# Patient Record
Sex: Female | Born: 1950
Health system: Southern US, Community
[De-identification: ages and names within clinical notes are randomized; demographics above are authoritative.]

## PROBLEM LIST (undated history)

## (undated) DIAGNOSIS — E785 Hyperlipidemia, unspecified: Secondary | ICD-10-CM

## (undated) DIAGNOSIS — R0989 Other specified symptoms and signs involving the circulatory and respiratory systems: Secondary | ICD-10-CM

## (undated) DIAGNOSIS — F32A Depression, unspecified: Secondary | ICD-10-CM

## (undated) DIAGNOSIS — E119 Type 2 diabetes mellitus without complications: Secondary | ICD-10-CM

## (undated) DIAGNOSIS — F419 Anxiety disorder, unspecified: Secondary | ICD-10-CM

## (undated) DIAGNOSIS — R931 Abnormal findings on diagnostic imaging of heart and coronary circulation: Secondary | ICD-10-CM

## (undated) DIAGNOSIS — K219 Gastro-esophageal reflux disease without esophagitis: Secondary | ICD-10-CM

## (undated) DIAGNOSIS — I341 Nonrheumatic mitral (valve) prolapse: Secondary | ICD-10-CM

## (undated) HISTORY — DX: Abnormal findings on diagnostic imaging of heart and coronary circulation: R93.1

## (undated) HISTORY — DX: Other specified symptoms and signs involving the circulatory and respiratory systems: R09.89

## (undated) HISTORY — DX: Type 2 diabetes mellitus without complications: E11.9

## (undated) HISTORY — DX: Hyperlipidemia, unspecified: E78.5

## (undated) HISTORY — DX: Gastro-esophageal reflux disease without esophagitis: K21.9

## (undated) HISTORY — DX: Nonrheumatic mitral (valve) prolapse: I34.1

## (undated) HISTORY — DX: Depression, unspecified: F32.A

## (undated) HISTORY — DX: Anxiety disorder, unspecified: F41.9

## (undated) HISTORY — PX: EYE SURGERY: SHX253

---

## 1997-10-20 ENCOUNTER — Other Ambulatory Visit: Admission: RE | Admit: 1997-10-20 | Discharge: 1997-10-20 | Payer: Self-pay | Admitting: Obstetrics and Gynecology

## 1999-02-01 ENCOUNTER — Other Ambulatory Visit: Admission: RE | Admit: 1999-02-01 | Discharge: 1999-02-01 | Payer: Self-pay | Admitting: *Deleted

## 1999-11-01 ENCOUNTER — Encounter (INDEPENDENT_AMBULATORY_CARE_PROVIDER_SITE_OTHER): Payer: Self-pay | Admitting: Specialist

## 1999-11-01 ENCOUNTER — Other Ambulatory Visit: Admission: RE | Admit: 1999-11-01 | Discharge: 1999-11-01 | Payer: Self-pay | Admitting: Obstetrics and Gynecology

## 1999-12-24 ENCOUNTER — Encounter: Payer: Self-pay | Admitting: Obstetrics and Gynecology

## 1999-12-24 ENCOUNTER — Encounter: Admission: RE | Admit: 1999-12-24 | Discharge: 1999-12-24 | Payer: Self-pay | Admitting: Obstetrics and Gynecology

## 2000-01-01 ENCOUNTER — Encounter: Payer: Self-pay | Admitting: Obstetrics and Gynecology

## 2000-01-01 ENCOUNTER — Encounter: Admission: RE | Admit: 2000-01-01 | Discharge: 2000-01-01 | Payer: Self-pay | Admitting: Obstetrics and Gynecology

## 2000-02-04 ENCOUNTER — Other Ambulatory Visit: Admission: RE | Admit: 2000-02-04 | Discharge: 2000-02-04 | Payer: Self-pay | Admitting: *Deleted

## 2000-07-25 ENCOUNTER — Encounter: Payer: Self-pay | Admitting: Obstetrics and Gynecology

## 2000-07-25 ENCOUNTER — Encounter: Admission: RE | Admit: 2000-07-25 | Discharge: 2000-07-25 | Payer: Self-pay | Admitting: Obstetrics and Gynecology

## 2000-12-30 ENCOUNTER — Encounter: Payer: Self-pay | Admitting: Obstetrics and Gynecology

## 2000-12-30 ENCOUNTER — Encounter: Admission: RE | Admit: 2000-12-30 | Discharge: 2000-12-30 | Payer: Self-pay | Admitting: Obstetrics and Gynecology

## 2001-01-13 ENCOUNTER — Encounter: Payer: Self-pay | Admitting: Obstetrics and Gynecology

## 2001-01-13 ENCOUNTER — Encounter: Admission: RE | Admit: 2001-01-13 | Discharge: 2001-01-13 | Payer: Self-pay | Admitting: Obstetrics and Gynecology

## 2001-02-16 ENCOUNTER — Other Ambulatory Visit: Admission: RE | Admit: 2001-02-16 | Discharge: 2001-02-16 | Payer: Self-pay | Admitting: Obstetrics and Gynecology

## 2002-02-01 ENCOUNTER — Encounter: Admission: RE | Admit: 2002-02-01 | Discharge: 2002-02-01 | Payer: Self-pay | Admitting: Obstetrics and Gynecology

## 2002-02-01 ENCOUNTER — Encounter: Payer: Self-pay | Admitting: Obstetrics and Gynecology

## 2002-02-22 ENCOUNTER — Other Ambulatory Visit: Admission: RE | Admit: 2002-02-22 | Discharge: 2002-02-22 | Payer: Self-pay | Admitting: Obstetrics and Gynecology

## 2003-02-04 ENCOUNTER — Encounter: Admission: RE | Admit: 2003-02-04 | Discharge: 2003-02-04 | Payer: Self-pay | Admitting: Obstetrics and Gynecology

## 2003-02-24 ENCOUNTER — Other Ambulatory Visit: Admission: RE | Admit: 2003-02-24 | Discharge: 2003-02-24 | Payer: Self-pay | Admitting: Obstetrics and Gynecology

## 2004-03-09 ENCOUNTER — Other Ambulatory Visit: Admission: RE | Admit: 2004-03-09 | Discharge: 2004-03-09 | Payer: Self-pay | Admitting: Obstetrics and Gynecology

## 2004-03-15 ENCOUNTER — Encounter: Admission: RE | Admit: 2004-03-15 | Discharge: 2004-03-15 | Payer: Self-pay | Admitting: Obstetrics and Gynecology

## 2004-03-29 ENCOUNTER — Encounter: Admission: RE | Admit: 2004-03-29 | Discharge: 2004-03-29 | Payer: Self-pay | Admitting: Obstetrics and Gynecology

## 2005-03-18 ENCOUNTER — Encounter: Admission: RE | Admit: 2005-03-18 | Discharge: 2005-03-18 | Payer: Self-pay | Admitting: Obstetrics and Gynecology

## 2005-03-29 ENCOUNTER — Other Ambulatory Visit: Admission: RE | Admit: 2005-03-29 | Discharge: 2005-03-29 | Payer: Self-pay | Admitting: Obstetrics and Gynecology

## 2006-03-19 ENCOUNTER — Encounter: Admission: RE | Admit: 2006-03-19 | Discharge: 2006-03-19 | Payer: Self-pay | Admitting: Obstetrics and Gynecology

## 2006-04-15 ENCOUNTER — Other Ambulatory Visit: Admission: RE | Admit: 2006-04-15 | Discharge: 2006-04-15 | Payer: Self-pay | Admitting: Obstetrics & Gynecology

## 2007-03-23 ENCOUNTER — Encounter: Admission: RE | Admit: 2007-03-23 | Discharge: 2007-03-23 | Payer: Self-pay | Admitting: Obstetrics & Gynecology

## 2007-04-17 ENCOUNTER — Other Ambulatory Visit: Admission: RE | Admit: 2007-04-17 | Discharge: 2007-04-17 | Payer: Self-pay | Admitting: Obstetrics and Gynecology

## 2008-02-19 HISTORY — PX: BREAST LUMPECTOMY: SHX2

## 2008-03-23 ENCOUNTER — Encounter: Admission: RE | Admit: 2008-03-23 | Discharge: 2008-03-23 | Payer: Self-pay | Admitting: Obstetrics & Gynecology

## 2008-04-28 ENCOUNTER — Other Ambulatory Visit: Admission: RE | Admit: 2008-04-28 | Discharge: 2008-04-28 | Payer: Self-pay | Admitting: Obstetrics and Gynecology

## 2008-12-15 ENCOUNTER — Encounter: Admission: RE | Admit: 2008-12-15 | Discharge: 2008-12-15 | Payer: Self-pay | Admitting: Obstetrics & Gynecology

## 2008-12-16 ENCOUNTER — Encounter: Admission: RE | Admit: 2008-12-16 | Discharge: 2008-12-16 | Payer: Self-pay | Admitting: Obstetrics and Gynecology

## 2008-12-22 ENCOUNTER — Encounter: Admission: RE | Admit: 2008-12-22 | Discharge: 2008-12-22 | Payer: Self-pay | Admitting: Obstetrics and Gynecology

## 2009-01-02 ENCOUNTER — Encounter (INDEPENDENT_AMBULATORY_CARE_PROVIDER_SITE_OTHER): Payer: Self-pay | Admitting: Surgery

## 2009-01-02 ENCOUNTER — Ambulatory Visit (HOSPITAL_COMMUNITY): Admission: RE | Admit: 2009-01-02 | Discharge: 2009-01-02 | Payer: Self-pay | Admitting: Surgery

## 2010-03-10 ENCOUNTER — Encounter: Payer: Self-pay | Admitting: Oncology

## 2010-03-11 ENCOUNTER — Encounter: Payer: Self-pay | Admitting: Obstetrics and Gynecology

## 2010-05-23 LAB — CBC
HCT: 40.4 % (ref 36.0–46.0)
MCV: 95.8 fL (ref 78.0–100.0)
RBC: 4.22 MIL/uL (ref 3.87–5.11)
WBC: 7 10*3/uL (ref 4.0–10.5)

## 2010-05-23 LAB — BASIC METABOLIC PANEL
CO2: 31 mEq/L (ref 19–32)
Chloride: 101 mEq/L (ref 96–112)
GFR calc Af Amer: >60 mL/min (ref >60)
Glucose, Bld: 102 mg/dL — ABNORMAL HIGH (ref 70–99)
Sodium: 138 mEq/L (ref 135–145)

## 2010-05-23 LAB — DIFFERENTIAL
Eosinophils Absolute: 0.3 10*3/uL (ref 0.0–0.7)
Eosinophils Relative: 5 % (ref 0–5)
Lymphs Abs: 2.5 10*3/uL (ref 0.7–4.0)
Monocytes Absolute: 0.3 10*3/uL (ref 0.1–1.0)
Monocytes Relative: 5 % (ref 3–12)

## 2014-11-21 DIAGNOSIS — Z923 Personal history of irradiation: Secondary | ICD-10-CM

## 2014-11-21 DIAGNOSIS — Z9221 Personal history of antineoplastic chemotherapy: Secondary | ICD-10-CM

## 2014-11-21 DIAGNOSIS — C50919 Malignant neoplasm of unspecified site of unspecified female breast: Secondary | ICD-10-CM

## 2014-11-21 DIAGNOSIS — Z79811 Long term (current) use of aromatase inhibitors: Secondary | ICD-10-CM

## 2015-12-26 DIAGNOSIS — Z79811 Long term (current) use of aromatase inhibitors: Secondary | ICD-10-CM | POA: Diagnosis not present

## 2015-12-26 DIAGNOSIS — Z170 Estrogen receptor positive status [ER+]: Secondary | ICD-10-CM | POA: Diagnosis not present

## 2015-12-26 DIAGNOSIS — M8580 Other specified disorders of bone density and structure, unspecified site: Secondary | ICD-10-CM | POA: Diagnosis not present

## 2015-12-26 DIAGNOSIS — C50212 Malignant neoplasm of upper-inner quadrant of left female breast: Secondary | ICD-10-CM | POA: Diagnosis not present

## 2016-01-12 ENCOUNTER — Encounter (HOSPITAL_COMMUNITY): Payer: Self-pay

## 2016-01-25 DIAGNOSIS — Z853 Personal history of malignant neoplasm of breast: Secondary | ICD-10-CM | POA: Diagnosis not present

## 2016-05-14 DIAGNOSIS — R002 Palpitations: Secondary | ICD-10-CM | POA: Diagnosis not present

## 2016-05-14 DIAGNOSIS — F5102 Adjustment insomnia: Secondary | ICD-10-CM | POA: Diagnosis not present

## 2016-05-21 DIAGNOSIS — R002 Palpitations: Secondary | ICD-10-CM | POA: Diagnosis not present

## 2016-05-22 DIAGNOSIS — D126 Benign neoplasm of colon, unspecified: Secondary | ICD-10-CM | POA: Diagnosis not present

## 2016-05-22 DIAGNOSIS — K219 Gastro-esophageal reflux disease without esophagitis: Secondary | ICD-10-CM | POA: Diagnosis not present

## 2016-05-23 DIAGNOSIS — R002 Palpitations: Secondary | ICD-10-CM | POA: Diagnosis not present

## 2016-06-11 DIAGNOSIS — G4733 Obstructive sleep apnea (adult) (pediatric): Secondary | ICD-10-CM | POA: Diagnosis not present

## 2016-06-19 DIAGNOSIS — Z9012 Acquired absence of left breast and nipple: Secondary | ICD-10-CM | POA: Diagnosis not present

## 2016-06-21 DIAGNOSIS — Z86010 Personal history of colonic polyps: Secondary | ICD-10-CM | POA: Diagnosis not present

## 2016-06-21 DIAGNOSIS — Z85828 Personal history of other malignant neoplasm of skin: Secondary | ICD-10-CM | POA: Diagnosis not present

## 2016-06-21 DIAGNOSIS — Z800 Family history of malignant neoplasm of digestive organs: Secondary | ICD-10-CM | POA: Diagnosis not present

## 2016-06-21 DIAGNOSIS — Z1211 Encounter for screening for malignant neoplasm of colon: Secondary | ICD-10-CM | POA: Diagnosis not present

## 2016-06-21 DIAGNOSIS — Z79899 Other long term (current) drug therapy: Secondary | ICD-10-CM | POA: Diagnosis not present

## 2016-06-21 DIAGNOSIS — E7800 Pure hypercholesterolemia, unspecified: Secondary | ICD-10-CM | POA: Diagnosis not present

## 2016-06-21 DIAGNOSIS — K648 Other hemorrhoids: Secondary | ICD-10-CM | POA: Diagnosis not present

## 2016-06-21 DIAGNOSIS — Z853 Personal history of malignant neoplasm of breast: Secondary | ICD-10-CM | POA: Diagnosis not present

## 2016-06-21 DIAGNOSIS — I10 Essential (primary) hypertension: Secondary | ICD-10-CM | POA: Diagnosis not present

## 2016-07-03 DIAGNOSIS — H04123 Dry eye syndrome of bilateral lacrimal glands: Secondary | ICD-10-CM | POA: Diagnosis not present

## 2016-07-03 DIAGNOSIS — H2510 Age-related nuclear cataract, unspecified eye: Secondary | ICD-10-CM | POA: Diagnosis not present

## 2016-07-03 DIAGNOSIS — H52222 Regular astigmatism, left eye: Secondary | ICD-10-CM | POA: Diagnosis not present

## 2016-10-17 DIAGNOSIS — F5102 Adjustment insomnia: Secondary | ICD-10-CM | POA: Diagnosis not present

## 2016-10-17 DIAGNOSIS — F411 Generalized anxiety disorder: Secondary | ICD-10-CM | POA: Diagnosis not present

## 2016-10-17 DIAGNOSIS — Z23 Encounter for immunization: Secondary | ICD-10-CM | POA: Diagnosis not present

## 2016-11-18 DIAGNOSIS — J302 Other seasonal allergic rhinitis: Secondary | ICD-10-CM | POA: Diagnosis not present

## 2016-11-18 DIAGNOSIS — F5102 Adjustment insomnia: Secondary | ICD-10-CM | POA: Diagnosis not present

## 2016-11-18 DIAGNOSIS — F411 Generalized anxiety disorder: Secondary | ICD-10-CM | POA: Diagnosis not present

## 2017-01-03 DIAGNOSIS — H2510 Age-related nuclear cataract, unspecified eye: Secondary | ICD-10-CM | POA: Diagnosis not present

## 2017-01-03 DIAGNOSIS — H04123 Dry eye syndrome of bilateral lacrimal glands: Secondary | ICD-10-CM | POA: Diagnosis not present

## 2017-02-21 DIAGNOSIS — Z1231 Encounter for screening mammogram for malignant neoplasm of breast: Secondary | ICD-10-CM | POA: Diagnosis not present

## 2017-02-24 DIAGNOSIS — Z853 Personal history of malignant neoplasm of breast: Secondary | ICD-10-CM | POA: Diagnosis not present

## 2017-05-29 DIAGNOSIS — H2510 Age-related nuclear cataract, unspecified eye: Secondary | ICD-10-CM | POA: Diagnosis not present

## 2017-05-29 DIAGNOSIS — H52222 Regular astigmatism, left eye: Secondary | ICD-10-CM | POA: Diagnosis not present

## 2017-06-11 DIAGNOSIS — H2513 Age-related nuclear cataract, bilateral: Secondary | ICD-10-CM | POA: Diagnosis not present

## 2017-06-11 DIAGNOSIS — H18413 Arcus senilis, bilateral: Secondary | ICD-10-CM | POA: Diagnosis not present

## 2017-06-11 DIAGNOSIS — H2511 Age-related nuclear cataract, right eye: Secondary | ICD-10-CM | POA: Diagnosis not present

## 2017-06-11 DIAGNOSIS — I10 Essential (primary) hypertension: Secondary | ICD-10-CM | POA: Diagnosis not present

## 2017-07-01 DIAGNOSIS — H2511 Age-related nuclear cataract, right eye: Secondary | ICD-10-CM | POA: Diagnosis not present

## 2017-07-01 DIAGNOSIS — H25811 Combined forms of age-related cataract, right eye: Secondary | ICD-10-CM | POA: Diagnosis not present

## 2017-07-08 DIAGNOSIS — H25812 Combined forms of age-related cataract, left eye: Secondary | ICD-10-CM | POA: Diagnosis not present

## 2017-07-08 DIAGNOSIS — H2512 Age-related nuclear cataract, left eye: Secondary | ICD-10-CM | POA: Diagnosis not present

## 2017-08-19 DIAGNOSIS — J208 Acute bronchitis due to other specified organisms: Secondary | ICD-10-CM | POA: Diagnosis not present

## 2017-12-02 DIAGNOSIS — Z23 Encounter for immunization: Secondary | ICD-10-CM | POA: Diagnosis not present

## 2018-01-27 DIAGNOSIS — L578 Other skin changes due to chronic exposure to nonionizing radiation: Secondary | ICD-10-CM | POA: Diagnosis not present

## 2018-01-27 DIAGNOSIS — L821 Other seborrheic keratosis: Secondary | ICD-10-CM | POA: Diagnosis not present

## 2018-01-27 DIAGNOSIS — C44619 Basal cell carcinoma of skin of left upper limb, including shoulder: Secondary | ICD-10-CM | POA: Diagnosis not present

## 2018-01-27 DIAGNOSIS — L820 Inflamed seborrheic keratosis: Secondary | ICD-10-CM | POA: Diagnosis not present

## 2018-01-30 DIAGNOSIS — C44619 Basal cell carcinoma of skin of left upper limb, including shoulder: Secondary | ICD-10-CM | POA: Diagnosis not present

## 2018-02-16 DIAGNOSIS — E782 Mixed hyperlipidemia: Secondary | ICD-10-CM | POA: Diagnosis not present

## 2018-02-16 DIAGNOSIS — I10 Essential (primary) hypertension: Secondary | ICD-10-CM | POA: Diagnosis not present

## 2018-02-25 DIAGNOSIS — Z6822 Body mass index (BMI) 22.0-22.9, adult: Secondary | ICD-10-CM | POA: Diagnosis not present

## 2018-02-25 DIAGNOSIS — E782 Mixed hyperlipidemia: Secondary | ICD-10-CM | POA: Diagnosis not present

## 2018-02-25 DIAGNOSIS — Z0000 Encounter for general adult medical examination without abnormal findings: Secondary | ICD-10-CM | POA: Diagnosis not present

## 2018-02-25 DIAGNOSIS — F411 Generalized anxiety disorder: Secondary | ICD-10-CM | POA: Diagnosis not present

## 2018-02-25 DIAGNOSIS — I10 Essential (primary) hypertension: Secondary | ICD-10-CM | POA: Diagnosis not present

## 2018-02-26 DIAGNOSIS — Z1231 Encounter for screening mammogram for malignant neoplasm of breast: Secondary | ICD-10-CM | POA: Diagnosis not present

## 2018-03-03 DIAGNOSIS — Z853 Personal history of malignant neoplasm of breast: Secondary | ICD-10-CM | POA: Diagnosis not present

## 2018-03-18 DIAGNOSIS — M85852 Other specified disorders of bone density and structure, left thigh: Secondary | ICD-10-CM | POA: Diagnosis not present

## 2018-03-18 DIAGNOSIS — N958 Other specified menopausal and perimenopausal disorders: Secondary | ICD-10-CM | POA: Diagnosis not present

## 2018-04-10 DIAGNOSIS — F419 Anxiety disorder, unspecified: Secondary | ICD-10-CM

## 2018-04-10 DIAGNOSIS — E785 Hyperlipidemia, unspecified: Secondary | ICD-10-CM

## 2018-04-10 DIAGNOSIS — R0600 Dyspnea, unspecified: Secondary | ICD-10-CM | POA: Diagnosis not present

## 2018-04-10 DIAGNOSIS — Z853 Personal history of malignant neoplasm of breast: Secondary | ICD-10-CM | POA: Diagnosis not present

## 2018-04-10 DIAGNOSIS — K219 Gastro-esophageal reflux disease without esophagitis: Secondary | ICD-10-CM | POA: Diagnosis not present

## 2018-04-10 DIAGNOSIS — Z79899 Other long term (current) drug therapy: Secondary | ICD-10-CM | POA: Diagnosis not present

## 2018-04-10 DIAGNOSIS — E871 Hypo-osmolality and hyponatremia: Secondary | ICD-10-CM | POA: Diagnosis not present

## 2018-04-10 DIAGNOSIS — R072 Precordial pain: Secondary | ICD-10-CM | POA: Diagnosis not present

## 2018-04-10 DIAGNOSIS — R202 Paresthesia of skin: Secondary | ICD-10-CM | POA: Diagnosis not present

## 2018-04-10 DIAGNOSIS — R413 Other amnesia: Secondary | ICD-10-CM

## 2018-04-10 DIAGNOSIS — I10 Essential (primary) hypertension: Secondary | ICD-10-CM

## 2018-04-10 DIAGNOSIS — R51 Headache: Secondary | ICD-10-CM | POA: Diagnosis not present

## 2018-04-10 DIAGNOSIS — R079 Chest pain, unspecified: Secondary | ICD-10-CM | POA: Diagnosis not present

## 2018-04-11 DIAGNOSIS — F419 Anxiety disorder, unspecified: Secondary | ICD-10-CM | POA: Diagnosis not present

## 2018-04-11 DIAGNOSIS — E785 Hyperlipidemia, unspecified: Secondary | ICD-10-CM | POA: Diagnosis not present

## 2018-04-11 DIAGNOSIS — R413 Other amnesia: Secondary | ICD-10-CM | POA: Diagnosis not present

## 2018-04-11 DIAGNOSIS — R51 Headache: Secondary | ICD-10-CM | POA: Diagnosis not present

## 2018-04-11 DIAGNOSIS — R410 Disorientation, unspecified: Secondary | ICD-10-CM | POA: Diagnosis not present

## 2018-04-11 DIAGNOSIS — I10 Essential (primary) hypertension: Secondary | ICD-10-CM | POA: Diagnosis not present

## 2018-04-11 DIAGNOSIS — E871 Hypo-osmolality and hyponatremia: Secondary | ICD-10-CM | POA: Diagnosis not present

## 2018-04-14 DIAGNOSIS — I10 Essential (primary) hypertension: Secondary | ICD-10-CM | POA: Diagnosis not present

## 2018-04-14 DIAGNOSIS — G43109 Migraine with aura, not intractable, without status migrainosus: Secondary | ICD-10-CM | POA: Diagnosis not present

## 2018-04-14 DIAGNOSIS — E871 Hypo-osmolality and hyponatremia: Secondary | ICD-10-CM | POA: Diagnosis not present

## 2018-04-15 ENCOUNTER — Other Ambulatory Visit: Payer: Self-pay | Admitting: *Deleted

## 2018-04-15 NOTE — Patient Outreach (Signed)
[facility]) Care Management  04/15/2018  Audrey Ruiz Feb 08, 1951 [id number]   Transition of care Referral date: 04/14/2018 Referral source Insurance : [employer]   Transition of care will be completed by primary care provider office who will refer to [name] care management if needed.   PLAN:  RNCM will close patient due to patient being enrolled in an external program.   Audrey Ruiz H. Annia Friendly, BSN, [place] [facility] Telephonic CM Phone: [phone] Fax: [phone]

## 2018-07-06 DIAGNOSIS — E782 Mixed hyperlipidemia: Secondary | ICD-10-CM | POA: Diagnosis not present

## 2018-07-06 DIAGNOSIS — F411 Generalized anxiety disorder: Secondary | ICD-10-CM | POA: Diagnosis not present

## 2018-07-06 DIAGNOSIS — I10 Essential (primary) hypertension: Secondary | ICD-10-CM | POA: Diagnosis not present

## 2018-10-07 DIAGNOSIS — E782 Mixed hyperlipidemia: Secondary | ICD-10-CM | POA: Diagnosis not present

## 2018-10-07 DIAGNOSIS — I10 Essential (primary) hypertension: Secondary | ICD-10-CM | POA: Diagnosis not present

## 2018-10-07 DIAGNOSIS — F411 Generalized anxiety disorder: Secondary | ICD-10-CM | POA: Diagnosis not present

## 2018-10-07 DIAGNOSIS — Z23 Encounter for immunization: Secondary | ICD-10-CM | POA: Diagnosis not present

## 2019-02-19 HISTORY — PX: CATARACT EXTRACTION BILATERAL W/ ANTERIOR VITRECTOMY: SHX1304

## 2019-03-02 DIAGNOSIS — E782 Mixed hyperlipidemia: Secondary | ICD-10-CM | POA: Diagnosis not present

## 2019-03-02 DIAGNOSIS — I10 Essential (primary) hypertension: Secondary | ICD-10-CM | POA: Diagnosis not present

## 2019-03-04 DIAGNOSIS — I10 Essential (primary) hypertension: Secondary | ICD-10-CM | POA: Diagnosis not present

## 2019-03-04 DIAGNOSIS — F411 Generalized anxiety disorder: Secondary | ICD-10-CM | POA: Diagnosis not present

## 2019-03-04 DIAGNOSIS — E782 Mixed hyperlipidemia: Secondary | ICD-10-CM | POA: Diagnosis not present

## 2019-04-15 ENCOUNTER — Other Ambulatory Visit: Payer: Self-pay | Admitting: Family Medicine

## 2019-04-22 ENCOUNTER — Other Ambulatory Visit: Payer: Self-pay | Admitting: Family Medicine

## 2019-06-21 ENCOUNTER — Other Ambulatory Visit: Payer: Self-pay | Admitting: Family Medicine

## 2019-06-21 DIAGNOSIS — E782 Mixed hyperlipidemia: Secondary | ICD-10-CM

## 2019-06-22 ENCOUNTER — Other Ambulatory Visit: Payer: PPO

## 2019-06-22 ENCOUNTER — Other Ambulatory Visit: Payer: Self-pay

## 2019-06-22 DIAGNOSIS — E782 Mixed hyperlipidemia: Secondary | ICD-10-CM | POA: Diagnosis not present

## 2019-06-23 LAB — LIPID PANEL
Chol/HDL Ratio: 2.6 ratio (ref 0.0–4.4)
Cholesterol, Total: 197 mg/dL (ref 100–199)
HDL: 75 mg/dL (ref >39)
LDL Chol Calc (NIH): 105 mg/dL — ABNORMAL HIGH (ref 0–99)
Triglycerides: 96 mg/dL (ref 0–149)
VLDL Cholesterol Cal: 17 mg/dL (ref 5–40)

## 2019-06-23 LAB — CARDIOVASCULAR RISK ASSESSMENT

## 2019-06-29 DIAGNOSIS — Z1231 Encounter for screening mammogram for malignant neoplasm of breast: Secondary | ICD-10-CM | POA: Diagnosis not present

## 2019-07-05 DIAGNOSIS — I10 Essential (primary) hypertension: Secondary | ICD-10-CM | POA: Diagnosis not present

## 2019-07-05 DIAGNOSIS — Z961 Presence of intraocular lens: Secondary | ICD-10-CM | POA: Diagnosis not present

## 2019-07-05 DIAGNOSIS — H18413 Arcus senilis, bilateral: Secondary | ICD-10-CM | POA: Diagnosis not present

## 2019-07-05 DIAGNOSIS — H26492 Other secondary cataract, left eye: Secondary | ICD-10-CM | POA: Diagnosis not present

## 2019-07-12 ENCOUNTER — Other Ambulatory Visit: Payer: Self-pay | Admitting: Physician Assistant

## 2019-07-12 DIAGNOSIS — H26492 Other secondary cataract, left eye: Secondary | ICD-10-CM | POA: Diagnosis not present

## 2019-07-12 DIAGNOSIS — H26491 Other secondary cataract, right eye: Secondary | ICD-10-CM | POA: Diagnosis not present

## 2019-07-12 NOTE — Telephone Encounter (Signed)
?  ok to refill °

## 2019-07-23 DIAGNOSIS — C50212 Malignant neoplasm of upper-inner quadrant of left female breast: Secondary | ICD-10-CM | POA: Diagnosis not present

## 2019-07-23 DIAGNOSIS — Z853 Personal history of malignant neoplasm of breast: Secondary | ICD-10-CM | POA: Diagnosis not present

## 2019-07-23 DIAGNOSIS — Z170 Estrogen receptor positive status [ER+]: Secondary | ICD-10-CM | POA: Diagnosis not present

## 2019-07-26 ENCOUNTER — Other Ambulatory Visit: Payer: Self-pay | Admitting: Family Medicine

## 2019-08-16 ENCOUNTER — Encounter: Payer: Self-pay | Admitting: Family Medicine

## 2019-08-16 ENCOUNTER — Telehealth (INDEPENDENT_AMBULATORY_CARE_PROVIDER_SITE_OTHER): Payer: PPO | Admitting: Family Medicine

## 2019-08-16 VITALS — BP 119/66 | HR 81 | Ht 66.0 in | Wt 134.5 lb

## 2019-08-16 DIAGNOSIS — J329 Chronic sinusitis, unspecified: Secondary | ICD-10-CM

## 2019-08-16 HISTORY — DX: Chronic sinusitis, unspecified: J32.9

## 2019-08-16 MED ORDER — AMOXICILLIN 500 MG PO CAPS: 500.0000 mg | ORAL_CAPSULE | Freq: Two times a day (BID) | ORAL | 0 refills | Status: DC

## 2019-08-16 NOTE — Progress Notes (Addendum)
Virtual Visit via Telephone Note   This visit type was conducted due to national recommendations for restrictions regarding the COVID-19 Pandemic (e.g. social distancing) in an effort to limit this patient's exposure and mitigate transmission in our community.  Due to her co-morbid illnesses, this patient is at least at moderate risk for complications without adequate follow up.  This format is felt to be most appropriate for this patient at this time.  The patient did not have access to video technology/had technical difficulties with video requiring transitioning to audio format only (telephone).  All issues noted in this document were discussed and addressed.  No physical exam could be performed with this format.  Patient verbally consented to a telehealth visit.   Patient Location:home Provider Location:office  PCP:  Benny Lennert, MD   Evaluation Performed: Acute visit   Subjective:    Patient ID: Audrey Ruiz, female    DOB: [birth year], 69 y.o.   MRN: [id number]  Chief Complaint  Patient presents with  . Sinusitis    HPI Pt seen today by  telephone  - I connected with  [name] on 08/16/19 by telephone and verified that I am speaking with the correct person.Physician  in the office. Patient at home   I discussed the limitations of evaluation and management by telemedicine. The patient expressed understanding and agreed to proceed. Pt with potential COVID symptoms Pt states congestion, cough, fever, ear popping, loss of taste and smell. Pt states facial pressure-forehead and fatigue. Daughter came to visit with similar symptoms last week. Pt states she had onset of symptoms several days after she left. Daughter is feeling better after taking antibiotics.  Pt completed COVID vaccines in March  Social History   Socioeconomic History  . Marital status: Married    Spouse name: Not on file  . Number of children: Not on file  . Years of education: Not  on file  . Highest education level: Not on file  Occupational History  . Not on file  Tobacco Use  . Smoking status: Not on file  Substance and Sexual Activity  . Alcohol use: Not on file  . Drug use: Not on file  . Sexual activity: Not on file  Other Topics Concern  . Not on file  Social History Narrative  . Not on file   Social Determinants of Health   Financial Resource Strain:   . Difficulty of Paying Living Expenses:   Food Insecurity:   . Worried About Charity fundraiser in the Last Year:   . Arboriculturist in the Last Year:   Transportation Needs:   . Film/video editor (Medical):   Marland Kitchen Lack of Transportation (Non-Medical):   Physical Activity:   . Days of Exercise per Week:   . Minutes of Exercise per Session:   Stress:   . Feeling of Stress :   Social Connections:   . Frequency of Communication with Friends and Family:   . Frequency of Social Gatherings with Friends and Family:   . Attends Religious Services:   . Active Member of Clubs or Organizations:   . Attends Archivist Meetings:   Marland Kitchen Marital Status:   Intimate Partner Violence:   . Fear of Current or Ex-Partner:   . Emotionally Abused:   Marland Kitchen Physically Abused:   . Sexually Abused:     Outpatient Medications Prior to Visit  Medication Sig Dispense Refill  . FLUoxetine (PROZAC) 20 MG  capsule Take 1 capsule by mouth once daily 90 capsule 0  . rosuvastatin (CRESTOR) 40 MG tablet Take 1 tablet by mouth once daily 90 tablet 3   No facility-administered medications prior to visit.    No Known Allergies  Review of Systems  Constitutional: Positive for fatigue and fever. Negative for chills.  HENT: Positive for congestion, rhinorrhea and sinus pressure. Negative for ear pain.   Respiratory: Positive for cough. Negative for shortness of breath.   Cardiovascular: Negative for chest pain and palpitations.  Gastrointestinal: Positive for abdominal pain. Negative for nausea and vomiting.    Neurological: Positive for headaches. Negative for dizziness.       Objective:    Physical Exam pt took temp-afebrile at home-telephone visit-hoarseness    Wt Readings from Last 3 Encounters:  08/16/19 134 lb 8 oz (61 kg)    Health Maintenance Due  Topic Date Due  . Hepatitis C Screening  Never done  . TETANUS/TDAP  Never done  . COLONOSCOPY  Never done  . MAMMOGRAM  12/17/2010  . PNA vac Low Risk Adult (1 of 2 - PCV13) Never done     Lab Results  Component Value Date   WBC 7.0 12/29/2008   HGB 14.0 12/29/2008   HCT 40.4 12/29/2008   MCV 95.8 12/29/2008   PLT 290 12/29/2008   Lab Results  Component Value Date   NA 138 12/29/2008   K 3.1 (L) 12/29/2008   CO2 31 12/29/2008   GLUCOSE 102 (H) 12/29/2008   BUN 15 12/29/2008   CREATININE 0.86 12/29/2008   CALCIUM 10.0 12/29/2008   Lab Results  Component Value Date   CHOL 197 06/22/2019   Lab Results  Component Value Date   HDL 75 06/22/2019   Lab Results  Component Value Date   LDLCALC 105 (H) 06/22/2019   Lab Results  Component Value Date   TRIG 96 06/22/2019   Lab Results  Component Value Date   CHOLHDL 2.6 06/22/2019      Assessment & Plan:  1. Sinusitis, unspecified chronicity, unspecified location Amoxil-rx, mucinex, flonase-appt in clinic for testing and additional evaluation if no improvement.   Spent 7 minutes with the patient with telehealth technology discussing the above problems.   Follow Up:  Virtual Visit  prn  Benny Lennert, MD [place] [phone]

## 2019-08-16 NOTE — Patient Instructions (Signed)
Pick up medication for sinusitis Amoxil-prescription Mucinex-over the Technical brewer the counter

## 2019-09-01 NOTE — Progress Notes (Signed)
Request completed.

## 2019-09-27 ENCOUNTER — Other Ambulatory Visit: Payer: Self-pay | Admitting: Family Medicine

## 2019-10-19 ENCOUNTER — Ambulatory Visit (INDEPENDENT_AMBULATORY_CARE_PROVIDER_SITE_OTHER): Payer: PPO | Admitting: Family Medicine

## 2019-10-19 ENCOUNTER — Encounter: Payer: Self-pay | Admitting: Family Medicine

## 2019-10-19 ENCOUNTER — Other Ambulatory Visit: Payer: Self-pay

## 2019-10-19 VITALS — BP 110/70 | HR 70 | Temp 97.4°F | Resp 17 | Ht 67.0 in | Wt 132.0 lb

## 2019-10-19 DIAGNOSIS — Z0000 Encounter for general adult medical examination without abnormal findings: Secondary | ICD-10-CM

## 2019-10-19 DIAGNOSIS — R7303 Prediabetes: Secondary | ICD-10-CM | POA: Insufficient documentation

## 2019-10-19 DIAGNOSIS — E785 Hyperlipidemia, unspecified: Secondary | ICD-10-CM

## 2019-10-19 DIAGNOSIS — M8580 Other specified disorders of bone density and structure, unspecified site: Secondary | ICD-10-CM

## 2019-10-19 DIAGNOSIS — K219 Gastro-esophageal reflux disease without esophagitis: Secondary | ICD-10-CM | POA: Insufficient documentation

## 2019-10-19 DIAGNOSIS — E1169 Type 2 diabetes mellitus with other specified complication: Secondary | ICD-10-CM | POA: Insufficient documentation

## 2019-10-19 HISTORY — DX: Gastro-esophageal reflux disease without esophagitis: K21.9

## 2019-10-19 HISTORY — DX: Encounter for general adult medical examination without abnormal findings: Z00.00

## 2019-10-19 HISTORY — DX: Type 2 diabetes mellitus with other specified complication: E11.69

## 2019-10-19 HISTORY — DX: Other specified disorders of bone density and structure, unspecified site: M85.80

## 2019-10-19 NOTE — Progress Notes (Signed)
Subjective:    Audrey Ruiz is a 69 y.o. female who presents for Medicare Annual/Subsequent preventive examination.  Preventive Screening-Counseling & Management  Tobacco Social History   Tobacco Use  Smoking Status Never Smoker  Smokeless Tobacco Never Used    Problems Prior to Visit 1. Osteopenia-Ca -told to decrease due to elevated calcium+Vit D, walking 2. HTN 3. Hyperlipidemia 4. GAD  Current Problems (verified) Patient Active Problem List   Diagnosis Date Noted  . Sinusitis 08/16/2019    Medications Prior to Visit Current Outpatient Medications on File Prior to Visit  Medication Sig Dispense Refill  . FLUoxetine (PROZAC) 20 MG capsule Take 1 capsule by mouth once daily 90 capsule 0  . fluticasone (FLONASE) 50 MCG/ACT nasal spray Use 2 spray(s) in each nostril once daily 48 g 3  . omeprazole (PRILOSEC) 20 MG capsule Take 20 mg by mouth daily.    . rosuvastatin (CRESTOR) 40 MG tablet Take 1 tablet by mouth once daily 90 tablet 3   No current facility-administered medications on file prior to visit.    Current Medications (verified) Current Outpatient Medications  Medication Sig Dispense Refill  . FLUoxetine (PROZAC) 20 MG capsule Take 1 capsule by mouth once daily 90 capsule 0  . fluticasone (FLONASE) 50 MCG/ACT nasal spray Use 2 spray(s) in each nostril once daily 48 g 3  . omeprazole (PRILOSEC) 20 MG capsule Take 20 mg by mouth daily.    . rosuvastatin (CRESTOR) 40 MG tablet Take 1 tablet by mouth once daily 90 tablet 3   No current facility-administered medications for this visit.     Allergies (verified) Patient has no known allergies.   PAST HISTORY  Family History Family History  Problem Relation Age of Onset  . Osteoporosis Mother   . Coronary artery disease Father   . Diabetes Sister   . Diabetes Maternal Grandfather     Social History-helps care for elderly parents, aunt Social History   Tobacco Use  . Smoking status: Never  Smoker  . Smokeless tobacco: Never Used  Substance Use Topics  . Alcohol use: Yes    Alcohol/week: 2.0 standard drinks    Types: 2 Glasses of wine per week    Comment: Everyday     Are there smokers in your home (other than you)? No Dad when pt was a teenager  Risk Factors Current exercise habits:  walking Dietary issues discussed: garden-veggies  Cardiac risk factors: well controlled risk  Depression Screen PHQ9-1  Activities of Daily Living In your present state of health, do you have any difficulty performing the following activities?:  Driving? no Managing money? no Feeding yourself? no Getting from bed to chair? no Climbing a flight of stairs?  no Preparing food and eating?: no Bathing or showering?  No dressing: no Getting to the toilet? No Using the toilet no Moving around from place to place: no In the past year have you fallen or had a near fall?:no falls   Are you sexually active?  yes  Do you have more than one partner?   no  Hearing Difficulties:  Do you often ask people to speak up or repeat themselves? Yes Do you experience ringing or noises in your ears?  yes Do you have difficulty understanding soft or whispered voices? no  Do you feel that you have a problem with memory? no  Do you often misplace items? no  Do you feel safe at home?  Yes  Cognitive Testing  None-concern for husband  Advanced Directives have been discussed with the patient? Yes-needs living will  List the Names of Other Physician/Practitioners you currently use: 1.  Dr. Lewis-oncology 2.  Dr. Matt Holmes special/ophth-cataract replacement  Immunization History  Administered Date(s) Administered  . Moderna SARS-COVID-2 Vaccination 04/16/2019, 05/17/2019    Screening Tests Needs shingles vaccine Colonoscopy  06-21-16 Mammogram 02-20-17, 20, 21-ordered by Dr. Bobby Rumpf at [facility] oncology center-followed  DEXA 03-18-18 Mertha Baars, MD   10/19/2019    Objective:    Body mass  index is 20.67 kg/m. BP 110/70   Pulse 70   Temp (!) 97.4 F (36.3 C)   Resp 17   Ht 5\' 7"  (1.702 m)   Wt 132 lb (59.9 kg)   BMI 20.67 kg/m    Assessment:     1. Encounter for Medicare annual wellness exam  pt needs COVID -3rd dose-2/21(1st) and 3/21 (2nd) Living will-needs paperwork 2. Hyperlipidemia associated with type 2 diabetes mellitus (HCC) - Lipid panel - Comprehensive Metabolic Panel (CMET) Crestor daily 3. Gastroesophageal reflux disease, unspecified whether esophagitis present - CBC w/Diff/Platelet Omeprazole daily 4. Osteopenia, unspecified location - VITAMIN D 25 Hydroxy (Vit-D Deficiency, Fractures)  elevated Ca-10.6-told to NOT take extra due to elevation Plan:    During the course of the visit the patient was educated and counseled about appropriate screening and preventive services including:   Medicare Attestation I have personally reviewed: The patient's medical and social history Their use of alcohol, tobacco or illicit drugs Their current medications and supplements The patient's functional ability including ADLs,fall risks, home safety risks, cognitive, and hearing and visual impairment Diet and physical activities Evidence for depression or mood disorders  The patient's weight, height, BMI, and visual acuity have been recorded in the chart.  I have made referrals, counseling, and provided education to the patient based on review of the above and I have provided the patient with a written personalized care plan for preventive services.     Mertha Baars, MD   10/19/2019

## 2019-10-19 NOTE — Patient Instructions (Addendum)
Shingles vaccine recommended Continue mammogram yearly Bone density 02/2020-continue vitd  Copy of Living Will

## 2019-10-20 ENCOUNTER — Other Ambulatory Visit (INDEPENDENT_AMBULATORY_CARE_PROVIDER_SITE_OTHER): Payer: PPO

## 2019-10-20 ENCOUNTER — Encounter: Payer: Self-pay | Admitting: Family Medicine

## 2019-10-20 ENCOUNTER — Other Ambulatory Visit: Payer: Self-pay

## 2019-10-20 ENCOUNTER — Other Ambulatory Visit: Payer: Self-pay | Admitting: Family Medicine

## 2019-10-20 DIAGNOSIS — Z23 Encounter for immunization: Secondary | ICD-10-CM

## 2019-10-20 LAB — COMPREHENSIVE METABOLIC PANEL
ALT: 15 IU/L (ref 0–32)
AST: 26 IU/L (ref 0–40)
Albumin/Globulin Ratio: 2.2 (ref 1.2–2.2)
Albumin: 4.6 g/dL (ref 3.8–4.8)
Alkaline Phosphatase: 61 IU/L (ref 48–121)
BUN/Creatinine Ratio: 18 (ref 12–28)
BUN: 12 mg/dL (ref 8–27)
Bilirubin Total: 0.9 mg/dL (ref 0.0–1.2)
CO2: 22 mmol/L (ref 20–29)
Calcium: 10.7 mg/dL — ABNORMAL HIGH (ref 8.7–10.3)
Chloride: 105 mmol/L (ref 96–106)
Creatinine, Ser: 0.66 mg/dL (ref 0.57–1.00)
GFR calc Af Amer: 104 mL/min/{1.73_m2} (ref >59)
GFR calc non Af Amer: 90 mL/min/{1.73_m2} (ref >59)
Globulin, Total: 2.1 g/dL (ref 1.5–4.5)
Glucose: 87 mg/dL (ref 65–99)
Potassium: 4.7 mmol/L (ref 3.5–5.2)
Sodium: 141 mmol/L (ref 134–144)
Total Protein: 6.7 g/dL (ref 6.0–8.5)

## 2019-10-20 LAB — CBC WITH DIFFERENTIAL/PLATELET
Basophils Absolute: 0.1 10*3/uL (ref 0.0–0.2)
Basos: 1 %
EOS (ABSOLUTE): 0.1 10*3/uL (ref 0.0–0.4)
Eos: 3 %
Hematocrit: 40.8 % (ref 34.0–46.6)
Hemoglobin: 13.2 g/dL (ref 11.1–15.9)
Immature Grans (Abs): 0 10*3/uL (ref 0.0–0.1)
Immature Granulocytes: 0 %
Lymphocytes Absolute: 2 10*3/uL (ref 0.7–3.1)
Lymphs: 42 %
MCH: 31.4 pg (ref 26.6–33.0)
MCHC: 32.4 g/dL (ref 31.5–35.7)
MCV: 97 fL (ref 79–97)
Monocytes Absolute: 0.3 10*3/uL (ref 0.1–0.9)
Monocytes: 7 %
Neutrophils Absolute: 2.2 10*3/uL (ref 1.4–7.0)
Neutrophils: 47 %
Platelets: 289 10*3/uL (ref 150–450)
RBC: 4.2 x10E6/uL (ref 3.77–5.28)
RDW: 13.2 % (ref 11.7–15.4)
WBC: 4.7 10*3/uL (ref 3.4–10.8)

## 2019-10-20 LAB — LIPID PANEL
Chol/HDL Ratio: 2.6 ratio (ref 0.0–4.4)
Cholesterol, Total: 199 mg/dL (ref 100–199)
HDL: 76 mg/dL (ref >39)
LDL Chol Calc (NIH): 107 mg/dL — ABNORMAL HIGH (ref 0–99)
Triglycerides: 89 mg/dL (ref 0–149)
VLDL Cholesterol Cal: 16 mg/dL (ref 5–40)

## 2019-10-20 LAB — VITAMIN D 25 HYDROXY (VIT D DEFICIENCY, FRACTURES): Vit D, 25-Hydroxy: 71.1 ng/mL (ref 30.0–100.0)

## 2019-10-20 LAB — CARDIOVASCULAR RISK ASSESSMENT

## 2019-10-20 NOTE — Progress Notes (Signed)
ele

## 2019-10-21 ENCOUNTER — Encounter: Payer: Self-pay | Admitting: Family Medicine

## 2019-10-21 LAB — PTH, INTACT AND CALCIUM
Calcium: 10.6 mg/dL — ABNORMAL HIGH (ref 8.7–10.3)
PTH: 41 pg/mL (ref 15–65)

## 2019-10-22 ENCOUNTER — Other Ambulatory Visit: Payer: Self-pay | Admitting: Physician Assistant

## 2019-10-25 NOTE — Addendum Note (Signed)
Addended byRochel Brome on: 10/25/2019 02:09 PM   Modules accepted: Level of Service

## 2019-11-02 DIAGNOSIS — L570 Actinic keratosis: Secondary | ICD-10-CM | POA: Diagnosis not present

## 2019-11-02 DIAGNOSIS — L820 Inflamed seborrheic keratosis: Secondary | ICD-10-CM | POA: Diagnosis not present

## 2019-11-02 DIAGNOSIS — L578 Other skin changes due to chronic exposure to nonionizing radiation: Secondary | ICD-10-CM | POA: Diagnosis not present

## 2019-11-02 DIAGNOSIS — C44619 Basal cell carcinoma of skin of left upper limb, including shoulder: Secondary | ICD-10-CM | POA: Diagnosis not present

## 2019-11-02 DIAGNOSIS — L821 Other seborrheic keratosis: Secondary | ICD-10-CM | POA: Diagnosis not present

## 2019-11-16 ENCOUNTER — Other Ambulatory Visit: Payer: Self-pay | Admitting: Family Medicine

## 2019-11-16 DIAGNOSIS — K219 Gastro-esophageal reflux disease without esophagitis: Secondary | ICD-10-CM

## 2020-01-14 ENCOUNTER — Other Ambulatory Visit: Payer: Self-pay | Admitting: Family Medicine

## 2020-02-21 ENCOUNTER — Telehealth: Payer: Self-pay | Admitting: Family Medicine

## 2020-02-21 NOTE — Progress Notes (Signed)
  Chronic Care Management   Outreach Note  02/21/2020 Name: Audrey Ruiz MRN: [id number] DOB: 1950-09-09  Referred by: Blane Ohara, MD Reason for referral : Chronic Care Management   An unsuccessful telephone outreach was attempted today. The patient was referred to the pharmacist for assistance with care management and care coordination.   Follow Up Plan:   [name]  Upstream Scheduler

## 2020-05-23 ENCOUNTER — Other Ambulatory Visit: Payer: Self-pay

## 2020-05-29 ENCOUNTER — Other Ambulatory Visit: Payer: Self-pay

## 2020-05-29 MED ORDER — ROSUVASTATIN CALCIUM 40 MG PO TABS: 40.0000 mg | ORAL_TABLET | Freq: Every day | ORAL | 2 refills | Status: DC

## 2020-06-01 ENCOUNTER — Ambulatory Visit (INDEPENDENT_AMBULATORY_CARE_PROVIDER_SITE_OTHER): Payer: PPO | Admitting: Nurse Practitioner

## 2020-06-01 ENCOUNTER — Other Ambulatory Visit: Payer: Self-pay | Admitting: Nurse Practitioner

## 2020-06-01 ENCOUNTER — Encounter: Payer: Self-pay | Admitting: Nurse Practitioner

## 2020-06-01 ENCOUNTER — Other Ambulatory Visit: Payer: Self-pay

## 2020-06-01 VITALS — BP 128/64 | HR 72 | Temp 97.3°F | Resp 16 | Ht 67.0 in | Wt 138.0 lb

## 2020-06-01 DIAGNOSIS — N182 Chronic kidney disease, stage 2 (mild): Secondary | ICD-10-CM

## 2020-06-01 DIAGNOSIS — E1122 Type 2 diabetes mellitus with diabetic chronic kidney disease: Secondary | ICD-10-CM

## 2020-06-01 DIAGNOSIS — F320 Major depressive disorder, single episode, mild: Secondary | ICD-10-CM | POA: Diagnosis not present

## 2020-06-01 DIAGNOSIS — K219 Gastro-esophageal reflux disease without esophagitis: Secondary | ICD-10-CM | POA: Diagnosis not present

## 2020-06-01 DIAGNOSIS — I341 Nonrheumatic mitral (valve) prolapse: Secondary | ICD-10-CM

## 2020-06-01 MED ORDER — FLUOXETINE HCL 10 MG PO TABS: 10.0000 mg | ORAL_TABLET | Freq: Every day | ORAL | 3 refills | Status: DC

## 2020-06-01 NOTE — Patient Instructions (Addendum)
Increase Fluoxetine to 30 mg daily Notify office of side effects We will call you with lab results and cardiology referral appointment Continue all medications Follow up fasting appointment 3 months  Preventive Care 72 Years and Older, Female Preventive care refers to lifestyle choices and visits with your health care provider that can promote health and wellness. This includes:  A yearly physical exam. This is also called an annual wellness visit.  Regular dental and eye exams.  Immunizations.  Screening for certain conditions.  Healthy lifestyle choices, such as: ? Eating a healthy diet. ? Getting regular exercise. ? Not using drugs or products that contain nicotine and tobacco. ? Limiting alcohol use. What can I expect for my preventive care visit? Physical exam Your health care provider will check your:  Height and weight. These may be used to calculate your BMI (body mass index). BMI is a measurement that tells if you are at a healthy weight.  Heart rate and blood pressure.  Body temperature.  Skin for abnormal spots. Counseling Your health care provider may ask you questions about your:  Past medical problems.  Family's medical history.  Alcohol, tobacco, and drug use.  Emotional well-being.  Home life and relationship well-being.  Sexual activity.  Diet, exercise, and sleep habits.  History of falls.  Memory and ability to understand (cognition).  Work and work Statistician.  Pregnancy and menstrual history.  Access to firearms. What immunizations do I need? Vaccines are usually given at various ages, according to a schedule. Your health care provider will recommend vaccines for you based on your age, medical history, and lifestyle or other factors, such as travel or where you work.   What tests do I need? Blood tests  Lipid and cholesterol levels. These may be checked every 5 years, or more often depending on your overall health.  Hepatitis C  test.  Hepatitis B test. Screening  Lung cancer screening. You may have this screening every year starting at age 72 if you have a 30-pack-year history of smoking and currently smoke or have quit within the past 15 years.  Colorectal cancer screening. ? All adults should have this screening starting at age 72 and continuing until age 26. ? Your health care provider may recommend screening at age 72 if you are at increased risk. ? You will have tests every 1-10 years, depending on your results and the type of screening test.  Diabetes screening. ? This is done by checking your blood sugar (glucose) after you have not eaten for a while (fasting). ? You may have this done every 1-3 years.  Mammogram. ? This may be done every 1-2 years. ? Talk with your health care provider about how often you should have regular mammograms.  Abdominal aortic aneurysm (AAA) screening. You may need this if you are a current or former smoker.  BRCA-related cancer screening. This may be done if you have a family history of breast, ovarian, tubal, or peritoneal cancers. Other tests  STD (sexually transmitted disease) testing, if you are at risk.  Bone density scan. This is done to screen for osteoporosis. You may have this done starting at age 27. Talk with your health care provider about your test results, treatment options, and if necessary, the need for more tests. Follow these instructions at home: Eating and drinking  Eat a diet that includes fresh fruits and vegetables, whole grains, lean protein, and low-fat dairy products. Limit your intake of foods with high amounts of sugar, saturated  fats, and salt.  Take vitamin and mineral supplements as recommended by your health care provider.  Do not drink alcohol if your health care provider tells you not to drink.  If you drink alcohol: ? Limit how much you have to 0-1 drink a day. ? Be aware of how much alcohol is in your drink. In the [place] one  drink equals one 12 oz bottle of beer (355 mL), one 5 oz glass of wine (148 mL), or one 1 oz glass of hard liquor (44 mL).   Lifestyle  Take daily care of your teeth and gums. Brush your teeth every morning and night with fluoride toothpaste. Floss one time each day.  Stay active. Exercise for at least 30 minutes 5 or more days each week.  Do not use any products that contain nicotine or tobacco, such as cigarettes, e-cigarettes, and chewing tobacco. If you need help quitting, ask your health care provider.  Do not use drugs.  If you are sexually active, practice safe sex. Use a condom or other form of protection in order to prevent STIs (sexually transmitted infections).  Talk with your health care provider about taking a low-dose aspirin or statin.  Find healthy ways to cope with stress, such as: ? Meditation, yoga, or listening to music. ? Journaling. ? Talking to a trusted person. ? Spending time with friends and family. Safety  Always wear your seat belt while driving or riding in a vehicle.  Do not drive: ? If you have been drinking alcohol. Do not ride with someone who has been drinking. ? When you are tired or distracted. ? While texting.  Wear a helmet and other protective equipment during sports activities.  If you have firearms in your house, make sure you follow all gun safety procedures. What's next?  Visit your health care provider once a year for an annual wellness visit.  Ask your health care provider how often you should have your eyes and teeth checked.  Stay up to date on all vaccines. This information is not intended to replace advice given to you by your health care provider. Make sure you discuss any questions you have with your health care provider. Document Revised: 01/26/2020 Document Reviewed: 01/29/2018 Elsevier Patient Education  2021 [employer].

## 2020-06-01 NOTE — Progress Notes (Signed)
Established Patient Office Visit  Subjective:  Patient ID: Audrey Ruiz, female    DOB: 1950/10/10  Age: 70 y.o. MRN: [id number]  CC:  Chief Complaint  Patient presents with  . Diabetes  . Hyperlipidemia  . Gastroesophageal Reflux    HPI Audrey Ruiz Audrey Ruiz is a 70 year old Caucasian female that presents for follow-up of type 2 diabetes mellitus, hyperlipidemia, and GERD. She has experienced increased stress in recent months. Her adult daughter was recently diagnosed with metastatic breast cancer. Her husband was recently diagnosed with early dementia. She assists with the care of her elderly parents and in-laws in addition to keeping her twin grandsons at times.  PHQ-9 score=3 and GAD-7 score= 5. She states, " I just am not happy enough". She does not currently see a Social worker. She has taken Prozac 20 mg for several years.  Mitral valve prolapse Audrey Ruiz has a past medical history of mitral valve prolapse.States her mother and maternal grandmother both have MVP. States she has experienced mild dyspnea a few times. Denies chest pain or syncope. She denies having a cardiac work-up or echocardiogram. She has agree to cardiologist referral for evaluation for peace of mind.   Type 2 Diabetes mellitus Audrey Ruiz has a past history of type 2 diabetes mellitus for several years. Current treatment is diet. She does not check her blood glucose levels. Denies hypoglycemic episodes.   Hyperlipidemia Audrey Ruiz has a history of hyperlipidemia for several years. Current treatment includes Crestor 40 mg daily. Well-controlled per last lipid panel on 10/18/20: TC 199, Trig 89, HDL 76, and LDL 107. She exercises regularly and eats a heart healthy diet. She denies arthralgias or myalgias related to statin therapy.    GERD Audrey Ruiz has a history of GERD for several years. Current treatment includes Prilosec 20 mg daily. Symptoms are well-controlled.    Past Surgical History:  Procedure Laterality  Date  . BREAST LUMPECTOMY Left 2010    Family History  Problem Relation Age of Onset  . Osteoporosis Mother   . Coronary artery disease Father   . Diabetes Sister   . Diabetes Maternal Grandfather     Social History   Socioeconomic History  . Marital status: Married    Spouse name: Not on file  . Number of children: 2  . Years of education: Not on file  . Highest education level: Master's degree (e.g., MA, MS, MEng, MEd, MSW, MBA)  Occupational History  . Occupation: Retired  Tobacco Use  . Smoking status: Never Smoker  . Smokeless tobacco: Never Used  Vaping Use  . Vaping Use: Never used  Substance and Sexual Activity  . Alcohol use: Yes    Alcohol/week: 2.0 standard drinks    Types: 2 Glasses of wine per week    Comment: Everyday  . Drug use: Never  . Sexual activity: Not Currently  Other Topics Concern  . Not on file  Social History Narrative  . Not on file      Outpatient Medications Prior to Visit  Medication Sig Dispense Refill  . FLUoxetine (PROZAC) 20 MG capsule Take 1 capsule by mouth once daily 90 capsule 3  . fluticasone (FLONASE) 50 MCG/ACT nasal spray Use 2 spray(s) in each nostril once daily 48 g 3  . omeprazole (PRILOSEC) 20 MG capsule Take 1 capsule by mouth once daily 90 capsule 3  . rosuvastatin (CRESTOR) 40 MG tablet Take 1 tablet (40 mg total) by mouth daily. 90 tablet 2   No facility-administered medications prior  to visit.    No Known Allergies  ROS Review of Systems  Constitutional: Positive for fatigue. Negative for appetite change and unexpected weight change.  HENT: Negative for congestion, ear pain, rhinorrhea, sinus pressure, sinus pain and tinnitus.   Eyes: Negative for pain.  Respiratory: Positive for shortness of breath. Negative for cough.   Cardiovascular: Negative for chest pain, palpitations and leg swelling.  Gastrointestinal: Negative for abdominal pain, constipation, diarrhea, nausea and vomiting.  Endocrine: Negative  for cold intolerance, heat intolerance, polydipsia, polyphagia and polyuria.  Genitourinary: Negative for dysuria, frequency and hematuria.  Musculoskeletal: Negative for arthralgias, back pain, joint swelling and myalgias.  Skin: Negative for rash.  Allergic/Immunologic: Positive for environmental allergies.  Neurological: Negative for dizziness and headaches.  Hematological: Negative for adenopathy.  Psychiatric/Behavioral: Negative for decreased concentration and sleep disturbance. The patient is not nervous/anxious.        Increased stress      Objective:    Physical Exam Vitals reviewed.  Constitutional:      Appearance: Normal appearance.  HENT:     Head: Normocephalic.     Right Ear: Tympanic membrane normal.     Left Ear: Tympanic membrane normal.     Mouth/Throat:     Mouth: Mucous membranes are moist.  Neck:     Vascular: No carotid bruit.  Cardiovascular:     Rate and Rhythm: Normal rate and regular rhythm.     Pulses: Normal pulses.     Heart sounds: Normal heart sounds.  Pulmonary:     Effort: Pulmonary effort is normal.     Breath sounds: Normal breath sounds.  Abdominal:     General: Bowel sounds are normal.     Palpations: Abdomen is soft.     Tenderness: There is no abdominal tenderness. There is no guarding.  Musculoskeletal:        General: No swelling.  Skin:    General: Skin is warm and dry.     Capillary Refill: Capillary refill takes less than 2 seconds.  Neurological:     General: No focal deficit present.     Mental Status: She is alert and oriented to person, place, and time.  Psychiatric:        Mood and Affect: Mood normal.        Behavior: Behavior normal.     BP 128/64   Pulse 72   Temp (!) 97.3 F (36.3 C)   Resp 16   Ht 5\' 7"  (1.702 m)   Wt 138 lb (62.6 kg)   BMI 21.61 kg/m  Wt Readings from Last 3 Encounters:  06/01/20 138 lb (62.6 kg)  10/19/19 132 lb (59.9 kg)  08/16/19 134 lb 8 oz (61 kg)     Health Maintenance Due   Topic Date Due  . HEMOGLOBIN A1C  Never done  . Hepatitis C Screening  Never done  . FOOT EXAM  Never done  . OPHTHALMOLOGY EXAM  Never done  . URINE MICROALBUMIN  Never done  . PNA vac Low Risk Adult (2 of 2 - PPSV23) 12/22/2015    Lab Results  Component Value Date   WBC 4.7 10/19/2019   HGB 13.2 10/19/2019   HCT 40.8 10/19/2019   MCV 97 10/19/2019   PLT 289 10/19/2019   Lab Results  Component Value Date   NA 141 10/19/2019   K 4.7 10/19/2019   CO2 22 10/19/2019   GLUCOSE 87 10/19/2019   BUN 12 10/19/2019   CREATININE 0.66 10/19/2019  BILITOT 0.9 10/19/2019   ALKPHOS 61 10/19/2019   AST 26 10/19/2019   ALT 15 10/19/2019   PROT 6.7 10/19/2019   ALBUMIN 4.6 10/19/2019   CALCIUM 10.6 (H) 10/20/2019   Lab Results  Component Value Date   CHOL 199 10/19/2019   Lab Results  Component Value Date   HDL 76 10/19/2019   Lab Results  Component Value Date   LDLCALC 107 (H) 10/19/2019   Lab Results  Component Value Date   TRIG 89 10/19/2019   Lab Results  Component Value Date   CHOLHDL 2.6 10/19/2019       Assessment & Plan:   1. Depression, major, single episode, mild (HCC)-not at goal - FLUoxetine (PROZAC) 10 MG tablet; Take 1 tablet (10 mg total) by mouth daily.  Dispense: 90 tablet; Refill: 3 -PHQ-9 score 3; GAD-7 score 5 today in office  2. Type 2 diabetes mellitus with stage 2 chronic kidney disease, without long-term current use of insulin (HCC)-well controlled - CBC with Differential/Platelet - Comprehensive metabolic panel - TSH - Hemoglobin A1c  3. Mitral valve prolapse - Ambulatory referral to Cardiology  4. Gastroesophageal reflux disease without esophagitis-well controlled -Continue Prilosec 20 mg daily  Increase Fluoxetine to 30 mg daily Notify office of side effects We will call you with lab results and cardiology referral appointment Continue all medications Follow up fasting appointment 3 months   Follow-up: 5-months, fasting    Signed, Rip Harbour, NP

## 2020-06-02 LAB — CBC WITH DIFFERENTIAL/PLATELET
Basophils Absolute: 0.1 10*3/uL (ref 0.0–0.2)
Basos: 1 %
EOS (ABSOLUTE): 0.1 10*3/uL (ref 0.0–0.4)
Eos: 2 %
Hematocrit: 37.9 % (ref 34.0–46.6)
Hemoglobin: 12.4 g/dL (ref 11.1–15.9)
Immature Grans (Abs): 0 10*3/uL (ref 0.0–0.1)
Immature Granulocytes: 0 %
Lymphocytes Absolute: 1.8 10*3/uL (ref 0.7–3.1)
Lymphs: 33 %
MCH: 31.3 pg (ref 26.6–33.0)
MCHC: 32.7 g/dL (ref 31.5–35.7)
MCV: 96 fL (ref 79–97)
Monocytes Absolute: 0.4 10*3/uL (ref 0.1–0.9)
Monocytes: 7 %
Neutrophils Absolute: 3.2 10*3/uL (ref 1.4–7.0)
Neutrophils: 57 %
Platelets: 284 10*3/uL (ref 150–450)
RBC: 3.96 x10E6/uL (ref 3.77–5.28)
RDW: 12.4 % (ref 11.7–15.4)
WBC: 5.6 10*3/uL (ref 3.4–10.8)

## 2020-06-02 LAB — COMPREHENSIVE METABOLIC PANEL
ALT: 20 IU/L (ref 0–32)
AST: 39 IU/L (ref 0–40)
Albumin/Globulin Ratio: 1.8 (ref 1.2–2.2)
Albumin: 4.3 g/dL (ref 3.7–4.7)
Alkaline Phosphatase: 54 IU/L (ref 44–121)
BUN/Creatinine Ratio: 18 (ref 12–28)
BUN: 13 mg/dL (ref 8–27)
Bilirubin Total: 0.9 mg/dL (ref 0.0–1.2)
CO2: 21 mmol/L (ref 20–29)
Calcium: 10.3 mg/dL (ref 8.7–10.3)
Chloride: 104 mmol/L (ref 96–106)
Creatinine, Ser: 0.73 mg/dL (ref 0.57–1.00)
Globulin, Total: 2.4 g/dL (ref 1.5–4.5)
Glucose: 72 mg/dL (ref 65–99)
Sodium: 140 mmol/L (ref 134–144)
Total Protein: 6.7 g/dL (ref 6.0–8.5)
eGFR: 88 mL/min/{1.73_m2} (ref >59)

## 2020-06-02 LAB — HEMOGLOBIN A1C
Est. average glucose Bld gHb Est-mCnc: 117 mg/dL
Hgb A1c MFr Bld: 5.7 % — ABNORMAL HIGH (ref 4.8–5.6)

## 2020-06-02 LAB — TSH: TSH: 2.15 u[IU]/mL (ref 0.450–4.500)

## 2020-06-13 DIAGNOSIS — E785 Hyperlipidemia, unspecified: Secondary | ICD-10-CM | POA: Insufficient documentation

## 2020-06-13 DIAGNOSIS — I341 Nonrheumatic mitral (valve) prolapse: Secondary | ICD-10-CM | POA: Insufficient documentation

## 2020-06-13 DIAGNOSIS — F419 Anxiety disorder, unspecified: Secondary | ICD-10-CM | POA: Insufficient documentation

## 2020-06-13 DIAGNOSIS — K219 Gastro-esophageal reflux disease without esophagitis: Secondary | ICD-10-CM | POA: Insufficient documentation

## 2020-06-13 DIAGNOSIS — F32A Depression, unspecified: Secondary | ICD-10-CM | POA: Insufficient documentation

## 2020-06-13 DIAGNOSIS — E119 Type 2 diabetes mellitus without complications: Secondary | ICD-10-CM | POA: Insufficient documentation

## 2020-06-16 ENCOUNTER — Ambulatory Visit: Payer: PPO | Admitting: Cardiology

## 2020-06-29 ENCOUNTER — Ambulatory Visit: Payer: PPO | Admitting: Cardiology

## 2020-06-29 ENCOUNTER — Other Ambulatory Visit: Payer: Self-pay

## 2020-06-29 ENCOUNTER — Encounter: Payer: Self-pay | Admitting: Cardiology

## 2020-06-29 VITALS — BP 122/70 | HR 73 | Ht 67.0 in | Wt 136.6 lb

## 2020-06-29 DIAGNOSIS — R0789 Other chest pain: Secondary | ICD-10-CM | POA: Diagnosis not present

## 2020-06-29 DIAGNOSIS — R0602 Shortness of breath: Secondary | ICD-10-CM

## 2020-06-29 DIAGNOSIS — E785 Hyperlipidemia, unspecified: Secondary | ICD-10-CM

## 2020-06-29 DIAGNOSIS — I341 Nonrheumatic mitral (valve) prolapse: Secondary | ICD-10-CM | POA: Diagnosis not present

## 2020-06-29 DIAGNOSIS — R5383 Other fatigue: Secondary | ICD-10-CM

## 2020-06-29 DIAGNOSIS — E559 Vitamin D deficiency, unspecified: Secondary | ICD-10-CM

## 2020-06-29 DIAGNOSIS — E1169 Type 2 diabetes mellitus with other specified complication: Secondary | ICD-10-CM | POA: Diagnosis not present

## 2020-06-29 NOTE — Progress Notes (Signed)
Cardiology Office Note:    Date:  06/29/2020   ID:  Audrey Ruiz Audrey Ruiz, DOB 30-Apr-1950, MRN [id number]  PCP:  [name], MD  Cardiologist:  None  Electrophysiologist:  None   Referring MD: Rip Harbour, NP   I have been having some shortness of breath and chest discomfort  History of Present Illness:    Audrey Ruiz is a 70 y.o. female with a hx of hyperlipidemia, mitral valve prolapse, GERD, type 2 diabetes, hyperlipidemia is here today at the request of her primary care provider.  The patient tells me that the last several months she has been experiencing shortness of breath on exertion.  She notes that activities that she used to do and will be fine with recently it has been a problem for her to exercise.  She also tells me when she is exercising sometimes she has intermittent chest discomfort.  Nothing makes it better or worse and resolves with time.  It is very sporadic.  It is concerning given the fact that she has family history of coronary artery disease.  Past Medical History:  Diagnosis Date  . Anxiety   . Depression   . Diabetes mellitus without complication ([name])   . Encounter for Medicare annual wellness exam 10/19/2019  . Gastroesophageal reflux disease 10/19/2019  . GERD (gastroesophageal reflux disease)   . Hyperlipidemia   . Hyperlipidemia associated with type 2 diabetes mellitus ([name]) 10/19/2019  . Mitral valve prolapse   . Osteopenia 10/19/2019  . Sinusitis 08/16/2019    Past Surgical History:  Procedure Laterality Date  . BREAST LUMPECTOMY Left 2010    Current Medications: Current Meds  Medication Sig  . FLUoxetine (PROZAC) 10 MG tablet Take 1 tablet (10 mg total) by mouth daily.  Marland Kitchen FLUoxetine (PROZAC) 20 MG capsule Take 1 capsule by mouth once daily  . fluticasone (FLONASE) 50 MCG/ACT nasal spray Use 2 spray(s) in each nostril once daily  . omeprazole (PRILOSEC) 20 MG capsule Take 1 capsule by mouth once daily  . rosuvastatin  (CRESTOR) 40 MG tablet Take 1 tablet (40 mg total) by mouth daily.     Allergies:   Patient has no known allergies.   Social History   Socioeconomic History  . Marital status: Married    Spouse name: Not on file  . Number of children: 2  . Years of education: Not on file  . Highest education level: Master's degree (e.g., MA, MS, MEng, MEd, MSW, MBA)  Occupational History  . Occupation: Retired  Tobacco Use  . Smoking status: Never Smoker  . Smokeless tobacco: Never Used  Vaping Use  . Vaping Use: Never used  Substance and Sexual Activity  . Alcohol use: Yes    Alcohol/week: 2.0 standard drinks    Types: 2 Glasses of wine per week    Comment: Everyday  . Drug use: Never  . Sexual activity: Not Currently  Other Topics Concern  . Not on file  Social History Narrative  . Not on file   Social Determinants of Health   Financial Resource Strain: Not on file  Food Insecurity: Not on file  Transportation Needs: Not on file  Physical Activity: Not on file  Stress: Not on file  Social Connections: Not on file     Family History: The patient's family history includes Coronary artery disease in her father; Diabetes in her maternal grandfather and sister; Osteoporosis in her mother.  ROS:   Review of Systems  Constitution: Negative for  decreased appetite, fever and weight gain.  HENT: Negative for congestion, ear discharge, hoarse voice and sore throat.   Eyes: Negative for discharge, redness, vision loss in right eye and visual halos.  Cardiovascular: Negative for chest pain, dyspnea on exertion, leg swelling, orthopnea and palpitations.  Respiratory: Negative for cough, hemoptysis, shortness of breath and snoring.   Endocrine: Negative for heat intolerance and polyphagia.  Hematologic/Lymphatic: Negative for bleeding problem. Does not bruise/bleed easily.  Skin: Negative for flushing, nail changes, rash and suspicious lesions.  Musculoskeletal: Negative for arthritis, joint  pain, muscle cramps, myalgias, neck pain and stiffness.  Gastrointestinal: Negative for abdominal pain, bowel incontinence, diarrhea and excessive appetite.  Genitourinary: Negative for decreased libido, genital sores and incomplete emptying.  Neurological: Negative for brief paralysis, focal weakness, headaches and loss of balance.  Psychiatric/Behavioral: Negative for altered mental status, depression and suicidal ideas.  Allergic/Immunologic: Negative for HIV exposure and persistent infections.    EKGs/Labs/Other Studies Reviewed:    The following studies were reviewed today:   EKG:  The ekg ordered today demonstrates sinus rhythm, heart rate 73 bpm with left bundle branch block.  Recent Labs: 06/01/2020: ALT 20; BUN 13; Creatinine, Ser 0.73; Hemoglobin 12.4; Platelets 284; Potassium CANCELED; Sodium 140; TSH 2.150  Recent Lipid Panel    Component Value Date/Time   CHOL 199 10/19/2019 0908   TRIG 89 10/19/2019 0908   HDL 76 10/19/2019 0908   CHOLHDL 2.6 10/19/2019 0908   LDLCALC 107 (H) 10/19/2019 0908    Physical Exam:    VS:  BP 122/70 (BP Location: Right Arm)   Pulse 73   Ht 5\' 7"  (1.702 m)   Wt 136 lb 9.6 oz (62 kg)   SpO2 98%   BMI 21.39 kg/m     Wt Readings from Last 3 Encounters:  06/29/20 136 lb 9.6 oz (62 kg)  06/01/20 138 lb (62.6 kg)  10/19/19 132 lb (59.9 kg)     GEN: Well nourished, well developed in no acute distress HEENT: Normal NECK: No JVD; No carotid bruits LYMPHATICS: No lymphadenopathy CARDIAC: S1S2 noted,RRR, no murmurs, rubs, gallops RESPIRATORY:  Clear to auscultation without rales, wheezing or rhonchi  ABDOMEN: Soft, non-tender, non-distended, +bowel sounds, no guarding. EXTREMITIES: No edema, No cyanosis, no clubbing MUSCULOSKELETAL:  No deformity  SKIN: Warm and dry NEUROLOGIC:  Alert and oriented x 3, non-focal PSYCHIATRIC:  Normal affect, good insight  ASSESSMENT:    1. Mitral valve prolapse   2. Other chest pain   3.  Hyperlipidemia associated with type 2 diabetes mellitus ([name])   4. SOB (shortness of breath)   5. Vitamin D deficiency   6. Fatigue, unspecified type    PLAN:     Her chest pain is concerning given her risk factors I like to proceed with pharmacologic nuclear stress test in this patient.  I talked to the patient about the testing today and she is agreeable to proceed.  In the meantime due to her history of mitral valve prolapse and significant shortness of breath along with fatigue and echocardiogram will be appropriate to assess for any worsening valvular abnormalities.  Prediabetes-this is being managed by his primary care doctor.  No adjustments for antidiabetic medications were made today.  The patient is in agreement with the above plan. The patient left the office in stable condition.  The patient will follow up in 3 months or sooner if needed.   Medication Adjustments/Labs and Tests Ordered: Current medicines are reviewed at length with the patient  today.  Concerns regarding medicines are outlined above.  Orders Placed This Encounter  Procedures  . VITAMIN D 25 Hydroxy (Vit-D Deficiency, Fractures)  . MYOCARDIAL PERFUSION IMAGING  . EKG 12-Lead  . ECHOCARDIOGRAM COMPLETE   No orders of the defined types were placed in this encounter.   Patient Instructions   Medication Instructions:  Your physician recommends that you continue on your current medications as directed. Please refer to the Current Medication list given to you today.  *If you need a refill on your cardiac medications before your next appointment, please call your pharmacy*   Lab Work: Your physician recommends that you return for lab work: TODAY: Vitamin D If you have labs (blood work) drawn today and your tests are completely normal, you will receive your results only by: Marland Kitchen MyChart Message (if you have MyChart) OR . A paper copy in the mail If you have any lab test that is abnormal or we need to change  your treatment, we will call you to review the results.   Testing/Procedures: Your physician has requested that you have an echocardiogram. Echocardiography is a painless test that uses sound waves to create images of your heart. It provides your doctor with information about the size and shape of your heart and how well your heart's chambers and valves are working. This procedure takes approximately one hour. There are no restrictions for this procedure.  Your physician has requested that you have a lexiscan myoview. For further information please visit [email or web address]. Please follow instruction sheet, as given.   Follow-Up: At [facility], you and your health needs are our priority.  As part of our continuing mission to provide you with exceptional heart care, we have created designated Provider Care Teams.  These Care Teams include your primary Cardiologist (physician) and Advanced Practice Providers (APPs -  Physician Assistants and Nurse Practitioners) who all work together to provide you with the care you need, when you need it.  We recommend signing up for the patient portal called "MyChart".  Sign up information is provided on this After Visit Summary.  MyChart is used to connect with patients for Virtual Visits (Telemedicine).  Patients are able to view lab/test results, encounter notes, upcoming appointments, etc.  Non-urgent messages can be sent to your provider as well.   To learn more about what you can do with MyChart, go to [email or web address].    Your next appointment:   3 month(s)  The format for your next appointment:   In Person  Provider:   Berniece Salines, DO   Other Instructions   [facility] [address] [place], [place] [address] Phone:  [phone]    Please arrive 15 minutes prior to your appointment time for registration and insurance purposes.  The test will take approximately 3 to 4 hours to complete; you may bring  reading material.  If someone comes with you to your appointment, they will need to remain in the main lobby due to limited space in the testing area. **If you are pregnant or breastfeeding, please notify the nuclear lab prior to your appointment**  How to prepare for your Myocardial Perfusion Test: . Do not eat or drink 3 hours prior to your test, except you may have water. . Do not consume products containing caffeine (regular or decaffeinated) 12 hours prior to your test. (ex: coffee, chocolate, sodas, tea). . Do bring a list of your current medications with you.  If not listed below, you may take  your medications as normal. . Do wear comfortable clothes (no dresses or overalls) and walking shoes, tennis shoes preferred (No heels or open toe shoes are allowed). . Do NOT wear cologne, perfume, aftershave, or lotions (deodorant is allowed). . If these instructions are not followed, your test will have to be rescheduled.  Please report to [address] for your test.  If you have questions or concerns about your appointment, you can call the [facility] at [phone].  If you cannot keep your appointment, please provide 24 hours notification to the Nuclear Lab, to avoid a possible $50 charge to your account.  Cardiac Nuclear Scan A cardiac nuclear scan is a test that is done to check the flow of blood to your heart. It is done when you are resting and when you are exercising. The test looks for problems such as:  Not enough blood reaching a portion of the heart.  The heart muscle not working as it should. You may need this test if:  You have heart disease.  You have had lab results that are not normal.  You have had heart surgery or a balloon procedure to open up blocked arteries (angioplasty).  You have chest pain.  You have shortness of breath. In this test, a special dye (tracer) is put into your bloodstream. The tracer will travel to your  heart. A camera will then take pictures of your heart to see how the tracer moves through your heart. This test is usually done at a hospital and takes 2-4 hours. Tell a doctor about:  Any allergies you have.  All medicines you are taking, including vitamins, herbs, eye drops, creams, and over-the-counter medicines.  Any problems you or family members have had with anesthetic medicines.  Any blood disorders you have.  Any surgeries you have had.  Any medical conditions you have.  Whether you are pregnant or may be pregnant. What are the risks? Generally, this is a safe test. However, problems may occur, such as:  Serious chest pain and heart attack. This is only a risk if the stress portion of the test is done.  Rapid heartbeat.  A feeling of warmth in your chest. This feeling usually does not last long.  Allergic reaction to the tracer. What happens before the test?  Ask your doctor about changing or stopping your normal medicines. This is important.  Follow instructions from your doctor about what you cannot eat or drink.  Remove your jewelry on the day of the test. What happens during the test?  An IV tube will be inserted into one of your veins.  Your doctor will give you a small amount of tracer through the IV tube.  You will wait for 20-40 minutes while the tracer moves through your bloodstream.  Your heart will be monitored with an electrocardiogram (ECG).  You will lie down on an exam table.  Pictures of your heart will be taken for about 15-20 minutes.  You may also have a stress test. For this test, one of these things may be done: ? You will be asked to exercise on a treadmill or a stationary bike. ? You will be given medicines that will make your heart work harder. This is done if you are unable to exercise.  When blood flow to your heart has peaked, a tracer will again be given through the IV tube.  After 20-40 minutes, you will get back on the exam  table. More  pictures will be taken of your heart.  Depending on the tracer that is used, more pictures may need to be taken 3-4 hours later.  Your IV tube will be removed when the test is over. The test may vary among doctors and hospitals. What happens after the test?  Ask your doctor: ? Whether you can return to your normal schedule, including diet, activities, and medicines. ? Whether you should drink more fluids. This will help to remove the tracer from your body. Drink enough fluid to keep your pee (urine) pale yellow.  Ask your doctor, or the department that is doing the test: ? When will my results be ready? ? How will I get my results? Summary  A cardiac nuclear scan is a test that is done to check the flow of blood to your heart.  Tell your doctor whether you are pregnant or may be pregnant.  Before the test, ask your doctor about changing or stopping your normal medicines. This is important.  Ask your doctor whether you can return to your normal activities. You may be asked to drink more fluids. This information is not intended to replace advice given to you by your health care provider. Make sure you discuss any questions you have with your health care provider. Document Revised: 05/27/2018 Document Reviewed: 07/21/2017 Elsevier Patient Education  2021 [place].  Echocardiogram An echocardiogram is a test that uses sound waves (ultrasound) to produce images of the heart. Images from an echocardiogram can provide important information about:  Heart size and shape.  The size and thickness and movement of your heart's walls.  Heart muscle function and strength.  Heart valve function or if you have stenosis. Stenosis is when the heart valves are too narrow.  If blood is flowing backward through the heart valves (regurgitation).  A tumor or infectious growth around the heart valves.  Areas of heart muscle that are not working well because of poor blood flow or  injury from a heart attack.  Aneurysm detection. An aneurysm is a weak or damaged part of an artery wall. The wall bulges out from the normal force of blood pumping through the body. Tell a health care provider about:  Any allergies you have.  All medicines you are taking, including vitamins, herbs, eye drops, creams, and over-the-counter medicines.  Any blood disorders you have.  Any surgeries you have had.  Any medical conditions you have.  Whether you are pregnant or may be pregnant. What are the risks? Generally, this is a safe test. However, problems may occur, including an allergic reaction to dye (contrast) that may be used during the test. What happens before the test? No specific preparation is needed. You may eat and drink normally. What happens during the test?  You will take off your clothes from the waist up and put on a hospital gown.  Electrodes or electrocardiogram (ECG)patches may be placed on your chest. The electrodes or patches are then connected to a device that monitors your heart rate and rhythm.  You will lie down on a table for an ultrasound exam. A gel will be applied to your chest to help sound waves pass through your skin.  A handheld device, called a transducer, will be pressed against your chest and moved over your heart. The transducer produces sound waves that travel to your heart and bounce back (or "echo" back) to the transducer. These sound waves will be captured in real-time and changed into images of your heart that  can be viewed on a video monitor. The images will be recorded on a computer and reviewed by your health care provider.  You may be asked to change positions or hold your breath for a short time. This makes it easier to get different views or better views of your heart.  In some cases, you may receive contrast through an IV in one of your veins. This can improve the quality of the pictures from your heart. The procedure may vary among  health care providers and hospitals.   What can I expect after the test? You may return to your normal, everyday life, including diet, activities, and medicines, unless your health care provider tells you not to do that. Follow these instructions at home:  It is up to you to get the results of your test. Ask your health care provider, or the department that is doing the test, when your results will be ready.  Keep all follow-up visits. This is important. Summary  An echocardiogram is a test that uses sound waves (ultrasound) to produce images of the heart.  Images from an echocardiogram can provide important information about the size and shape of your heart, heart muscle function, heart valve function, and other possible heart problems.  You do not need to do anything to prepare before this test. You may eat and drink normally.  After the echocardiogram is completed, you may return to your normal, everyday life, unless your health care provider tells you not to do that. This information is not intended to replace advice given to you by your health care provider. Make sure you discuss any questions you have with your health care provider. Document Revised: 09/28/2019 Document Reviewed: 09/28/2019 Elsevier Patient Education  2021 [place].      Adopting a Healthy Lifestyle.  Know what a healthy weight is for you (roughly BMI <25) and aim to maintain this   Aim for 7+ servings of fruits and vegetables daily   65-80+ fluid ounces of water or unsweet tea for healthy kidneys   Limit to max 1 drink of alcohol per day; avoid smoking/tobacco   Limit animal fats in diet for cholesterol and heart health - choose grass fed whenever available   Avoid highly processed foods, and foods high in saturated/trans fats   Aim for low stress - take time to unwind and care for your mental health   Aim for 150 min of moderate intensity exercise weekly for heart health, and weights twice weekly  for bone health   Aim for 7-9 hours of sleep daily   When it comes to diets, agreement about the perfect plan isnt easy to find, even among the experts. Experts at the [employer] developed an idea known as the Healthy Eating Plate. Just imagine a plate divided into logical, healthy portions.   The emphasis is on diet quality:   Load up on vegetables and fruits - one-half of your plate: Aim for color and variety, and remember that potatoes dont count.   Go for whole grains - one-quarter of your plate: Whole wheat, barley, wheat berries, quinoa, oats, brown rice, and foods made with them. If you want pasta, go with whole wheat pasta.   Protein power - one-quarter of your plate: Fish, chicken, beans, and nuts are all healthy, versatile protein sources. Limit red meat.   The diet, however, does go beyond the plate, offering a few other suggestions.   Use healthy plant oils, such as olive,  canola, soy, corn, sunflower and peanut. Check the labels, and avoid partially hydrogenated oil, which have unhealthy trans fats.   If youre thirsty, drink water. Coffee and tea are good in moderation, but skip sugary drinks and limit milk and dairy products to one or two daily servings.   The type of carbohydrate in the diet is more important than the amount. Some sources of carbohydrates, such as vegetables, fruits, whole grains, and beans-are healthier than others.   Finally, stay active  Signed, Berniece Salines, DO  06/29/2020 2:15 PM    [facility]

## 2020-06-29 NOTE — Patient Instructions (Signed)
Medication Instructions:  Your physician recommends that you continue on your current medications as directed. Please refer to the Current Medication list given to you today.  *If you need a refill on your cardiac medications before your next appointment, please call your pharmacy*   Lab Work: Your physician recommends that you return for lab work: TODAY: Vitamin D If you have labs (blood work) drawn today and your tests are completely normal, you will receive your results only by: Marland Kitchen MyChart Message (if you have MyChart) OR . A paper copy in the mail If you have any lab test that is abnormal or we need to change your treatment, we will call you to review the results.   Testing/Procedures: Your physician has requested that you have an echocardiogram. Echocardiography is a painless test that uses sound waves to create images of your heart. It provides your doctor with information about the size and shape of your heart and how well your heart's chambers and valves are working. This procedure takes approximately one hour. There are no restrictions for this procedure.  Your physician has requested that you have a lexiscan myoview. For further information please visit [email or web address]. Please follow instruction sheet, as given.   Follow-Up: At [facility], you and your health needs are our priority.  As part of our continuing mission to provide you with exceptional heart care, we have created designated Provider Care Teams.  These Care Teams include your primary Cardiologist (physician) and Advanced Practice Providers (APPs -  Physician Assistants and Nurse Practitioners) who all work together to provide you with the care you need, when you need it.  We recommend signing up for the patient portal called "MyChart".  Sign up information is provided on this After Visit Summary.  MyChart is used to connect with patients for Virtual Visits (Telemedicine).  Patients are able to view lab/test  results, encounter notes, upcoming appointments, etc.  Non-urgent messages can be sent to your provider as well.   To learn more about what you can do with MyChart, go to [email or web address].    Your next appointment:   3 month(s)  The format for your next appointment:   In Person  Provider:   Berniece Salines, DO   Other Instructions   [facility] [address] [place], [place] [address] Phone:  [phone]    Please arrive 15 minutes prior to your appointment time for registration and insurance purposes.  The test will take approximately 3 to 4 hours to complete; you may bring reading material.  If someone comes with you to your appointment, they will need to remain in the main lobby due to limited space in the testing area. **If you are pregnant or breastfeeding, please notify the nuclear lab prior to your appointment**  How to prepare for your Myocardial Perfusion Test: . Do not eat or drink 3 hours prior to your test, except you may have water. . Do not consume products containing caffeine (regular or decaffeinated) 12 hours prior to your test. (ex: coffee, chocolate, sodas, tea). . Do bring a list of your current medications with you.  If not listed below, you may take your medications as normal. . Do wear comfortable clothes (no dresses or overalls) and walking shoes, tennis shoes preferred (No heels or open toe shoes are allowed). . Do NOT wear cologne, perfume, aftershave, or lotions (deodorant is allowed). . If these instructions are not followed, your test will have to be rescheduled.  Please report to  [address] for your test.  If you have questions or concerns about your appointment, you can call the [facility] at [phone].  If you cannot keep your appointment, please provide 24 hours notification to the Nuclear Lab, to avoid a possible $50 charge to your account.  Cardiac Nuclear Scan A  cardiac nuclear scan is a test that is done to check the flow of blood to your heart. It is done when you are resting and when you are exercising. The test looks for problems such as:  Not enough blood reaching a portion of the heart.  The heart muscle not working as it should. You may need this test if:  You have heart disease.  You have had lab results that are not normal.  You have had heart surgery or a balloon procedure to open up blocked arteries (angioplasty).  You have chest pain.  You have shortness of breath. In this test, a special dye (tracer) is put into your bloodstream. The tracer will travel to your heart. A camera will then take pictures of your heart to see how the tracer moves through your heart. This test is usually done at a hospital and takes 2-4 hours. Tell a doctor about:  Any allergies you have.  All medicines you are taking, including vitamins, herbs, eye drops, creams, and over-the-counter medicines.  Any problems you or family members have had with anesthetic medicines.  Any blood disorders you have.  Any surgeries you have had.  Any medical conditions you have.  Whether you are pregnant or may be pregnant. What are the risks? Generally, this is a safe test. However, problems may occur, such as:  Serious chest pain and heart attack. This is only a risk if the stress portion of the test is done.  Rapid heartbeat.  A feeling of warmth in your chest. This feeling usually does not last long.  Allergic reaction to the tracer. What happens before the test?  Ask your doctor about changing or stopping your normal medicines. This is important.  Follow instructions from your doctor about what you cannot eat or drink.  Remove your jewelry on the day of the test. What happens during the test?  An IV tube will be inserted into one of your veins.  Your doctor will give you a small amount of tracer through the IV tube.  You will wait for 20-40  minutes while the tracer moves through your bloodstream.  Your heart will be monitored with an electrocardiogram (ECG).  You will lie down on an exam table.  Pictures of your heart will be taken for about 15-20 minutes.  You may also have a stress test. For this test, one of these things may be done: ? You will be asked to exercise on a treadmill or a stationary bike. ? You will be given medicines that will make your heart work harder. This is done if you are unable to exercise.  When blood flow to your heart has peaked, a tracer will again be given through the IV tube.  After 20-40 minutes, you will get back on the exam table. More pictures will be taken of your heart.  Depending on the tracer that is used, more pictures may need to be taken 3-4 hours later.  Your IV tube will be removed when the test is over. The test may vary among doctors and hospitals. What happens after the test?  Ask your doctor: ? Whether you  can return to your normal schedule, including diet, activities, and medicines. ? Whether you should drink more fluids. This will help to remove the tracer from your body. Drink enough fluid to keep your pee (urine) pale yellow.  Ask your doctor, or the department that is doing the test: ? When will my results be ready? ? How will I get my results? Summary  A cardiac nuclear scan is a test that is done to check the flow of blood to your heart.  Tell your doctor whether you are pregnant or may be pregnant.  Before the test, ask your doctor about changing or stopping your normal medicines. This is important.  Ask your doctor whether you can return to your normal activities. You may be asked to drink more fluids. This information is not intended to replace advice given to you by your health care provider. Make sure you discuss any questions you have with your health care provider. Document Revised: 05/27/2018 Document Reviewed: 07/21/2017 Elsevier Patient Education   2021 [place].  Echocardiogram An echocardiogram is a test that uses sound waves (ultrasound) to produce images of the heart. Images from an echocardiogram can provide important information about:  Heart size and shape.  The size and thickness and movement of your heart's walls.  Heart muscle function and strength.  Heart valve function or if you have stenosis. Stenosis is when the heart valves are too narrow.  If blood is flowing backward through the heart valves (regurgitation).  A tumor or infectious growth around the heart valves.  Areas of heart muscle that are not working well because of poor blood flow or injury from a heart attack.  Aneurysm detection. An aneurysm is a weak or damaged part of an artery wall. The wall bulges out from the normal force of blood pumping through the body. Tell a health care provider about:  Any allergies you have.  All medicines you are taking, including vitamins, herbs, eye drops, creams, and over-the-counter medicines.  Any blood disorders you have.  Any surgeries you have had.  Any medical conditions you have.  Whether you are pregnant or may be pregnant. What are the risks? Generally, this is a safe test. However, problems may occur, including an allergic reaction to dye (contrast) that may be used during the test. What happens before the test? No specific preparation is needed. You may eat and drink normally. What happens during the test?  You will take off your clothes from the waist up and put on a hospital gown.  Electrodes or electrocardiogram (ECG)patches may be placed on your chest. The electrodes or patches are then connected to a device that monitors your heart rate and rhythm.  You will lie down on a table for an ultrasound exam. A gel will be applied to your chest to help sound waves pass through your skin.  A handheld device, called a transducer, will be pressed against your chest and moved over your heart. The  transducer produces sound waves that travel to your heart and bounce back (or "echo" back) to the transducer. These sound waves will be captured in real-time and changed into images of your heart that can be viewed on a video monitor. The images will be recorded on a computer and reviewed by your health care provider.  You may be asked to change positions or hold your breath for a short time. This makes it easier to get different views or better views of your heart.  In some cases,  you may receive contrast through an IV in one of your veins. This can improve the quality of the pictures from your heart. The procedure may vary among health care providers and hospitals.   What can I expect after the test? You may return to your normal, everyday life, including diet, activities, and medicines, unless your health care provider tells you not to do that. Follow these instructions at home:  It is up to you to get the results of your test. Ask your health care provider, or the department that is doing the test, when your results will be ready.  Keep all follow-up visits. This is important. Summary  An echocardiogram is a test that uses sound waves (ultrasound) to produce images of the heart.  Images from an echocardiogram can provide important information about the size and shape of your heart, heart muscle function, heart valve function, and other possible heart problems.  You do not need to do anything to prepare before this test. You may eat and drink normally.  After the echocardiogram is completed, you may return to your normal, everyday life, unless your health care provider tells you not to do that. This information is not intended to replace advice given to you by your health care provider. Make sure you discuss any questions you have with your health care provider. Document Revised: 09/28/2019 Document Reviewed: 09/28/2019 Elsevier Patient Education  2021 [employer].

## 2020-06-30 LAB — VITAMIN D 25 HYDROXY (VIT D DEFICIENCY, FRACTURES): Vit D, 25-Hydroxy: 74.6 ng/mL (ref 30.0–100.0)

## 2020-07-03 ENCOUNTER — Telehealth: Payer: Self-pay | Admitting: Cardiology

## 2020-07-03 NOTE — Telephone Encounter (Signed)
    Pt is returning call to get lab results 

## 2020-07-05 NOTE — Telephone Encounter (Signed)
Spoke with patient about her lab results. Patient verbalizes understanding. No further questions or concerns at this time.   

## 2020-07-05 NOTE — Telephone Encounter (Signed)
Left message for patient to return our call.

## 2020-07-10 ENCOUNTER — Telehealth (HOSPITAL_COMMUNITY): Payer: Self-pay | Admitting: *Deleted

## 2020-07-10 NOTE — Telephone Encounter (Signed)
Left message on voicemail per DPR in reference to upcoming appointment scheduled on 07/13/20 with detailed instructions given per Myocardial Perfusion Study Information Sheet for the test. LM to arrive 15 minutes early, and that it is imperative to arrive on time for appointment to keep from having the test rescheduled. If you need to cancel or reschedule your appointment, please call the office within 24 hours of your appointment. Failure to do so may result in a cancellation of your appointment, and a $50 no show fee. Phone number given for call back for any questions. Kirstie Peri

## 2020-07-10 NOTE — Telephone Encounter (Signed)
error 

## 2020-07-13 ENCOUNTER — Other Ambulatory Visit: Payer: Self-pay

## 2020-07-13 ENCOUNTER — Ambulatory Visit (INDEPENDENT_AMBULATORY_CARE_PROVIDER_SITE_OTHER): Payer: PPO

## 2020-07-13 DIAGNOSIS — R0602 Shortness of breath: Secondary | ICD-10-CM

## 2020-07-13 DIAGNOSIS — R0789 Other chest pain: Secondary | ICD-10-CM | POA: Diagnosis not present

## 2020-07-13 LAB — MYOCARDIAL PERFUSION IMAGING
LV dias vol: 127 mL (ref 46–106)
LV sys vol: 73 mL
Peak HR: 86 {beats}/min
Rest HR: 65 {beats}/min
SDS: 3
SRS: 15
SSS: 18
TID: 1.03

## 2020-07-13 MED ORDER — REGADENOSON 0.4 MG/5ML IV SOLN
0.4000 mg | Freq: Once | INTRAVENOUS | Status: AC
  Administered 2020-07-13: 0.4 mg via INTRAVENOUS

## 2020-07-13 MED ORDER — TECHNETIUM TC 99M TETROFOSMIN IV KIT
10.9000 | PACK | Freq: Once | INTRAVENOUS | Status: AC | PRN
  Administered 2020-07-13: 10.9 via INTRAVENOUS

## 2020-07-13 MED ORDER — TECHNETIUM TC 99M TETROFOSMIN IV KIT
31.7000 | PACK | Freq: Once | INTRAVENOUS | Status: AC | PRN
  Administered 2020-07-13: 31.7 via INTRAVENOUS

## 2020-07-24 ENCOUNTER — Ambulatory Visit (INDEPENDENT_AMBULATORY_CARE_PROVIDER_SITE_OTHER): Payer: PPO

## 2020-07-24 ENCOUNTER — Other Ambulatory Visit: Payer: Self-pay

## 2020-07-24 DIAGNOSIS — I341 Nonrheumatic mitral (valve) prolapse: Secondary | ICD-10-CM | POA: Diagnosis not present

## 2020-07-24 DIAGNOSIS — R9430 Abnormal result of cardiovascular function study, unspecified: Secondary | ICD-10-CM

## 2020-07-24 LAB — ECHOCARDIOGRAM COMPLETE
Area-P 1/2: 3.72 cm2
Calc EF: 36.5 %
S' Lateral: 4.6 cm
Single Plane A2C EF: 36.7 %
Single Plane A4C EF: 36.6 %

## 2020-07-26 MED ORDER — METOPROLOL SUCCINATE ER 25 MG PO TB24: 12.5000 mg | ORAL_TABLET | Freq: Every day | ORAL | 3 refills | Status: DC

## 2020-07-26 MED ORDER — ASPIRIN EC 81 MG PO TBEC: 81.0000 mg | DELAYED_RELEASE_TABLET | Freq: Every day | ORAL | 3 refills | Status: DC

## 2020-08-01 ENCOUNTER — Other Ambulatory Visit: Payer: Self-pay | Admitting: Cardiology

## 2020-08-01 ENCOUNTER — Ambulatory Visit (INDEPENDENT_AMBULATORY_CARE_PROVIDER_SITE_OTHER): Payer: PPO | Admitting: Cardiology

## 2020-08-01 ENCOUNTER — Other Ambulatory Visit: Payer: Self-pay

## 2020-08-01 ENCOUNTER — Encounter: Payer: Self-pay | Admitting: Cardiology

## 2020-08-01 VITALS — BP 140/72 | HR 68 | Ht 67.0 in | Wt 133.0 lb

## 2020-08-01 DIAGNOSIS — R0609 Other forms of dyspnea: Secondary | ICD-10-CM

## 2020-08-01 DIAGNOSIS — I420 Dilated cardiomyopathy: Secondary | ICD-10-CM | POA: Diagnosis not present

## 2020-08-01 DIAGNOSIS — I10 Essential (primary) hypertension: Secondary | ICD-10-CM

## 2020-08-01 DIAGNOSIS — E785 Hyperlipidemia, unspecified: Secondary | ICD-10-CM

## 2020-08-01 DIAGNOSIS — R0989 Other specified symptoms and signs involving the circulatory and respiratory systems: Secondary | ICD-10-CM

## 2020-08-01 DIAGNOSIS — R0600 Dyspnea, unspecified: Secondary | ICD-10-CM | POA: Diagnosis not present

## 2020-08-01 DIAGNOSIS — E1169 Type 2 diabetes mellitus with other specified complication: Secondary | ICD-10-CM | POA: Diagnosis not present

## 2020-08-01 MED ORDER — ENTRESTO 24-26 MG PO TABS: 1.0000 | ORAL_TABLET | Freq: Two times a day (BID) | ORAL | 3 refills | Status: DC

## 2020-08-01 NOTE — Patient Instructions (Signed)
Medication Instructions:  Your physician has recommended you make the following change in your medication: START: Entresto 24-26 mg twice daily *If you need a refill on your cardiac medications before your next appointment, please call your pharmacy*   Lab Work: Your physician recommends that you return for lab work:  TODAY: BMET, Mag, CBC If you have labs (blood work) drawn today and your tests are completely normal, you will receive your results only by: MyChart Message (if you have MyChart) OR A paper copy in the mail If you have any lab test that is abnormal or we need to change your treatment, we will call you to review the results.   Testing/Procedures:    [place] [id number] Dept: [phone] Loc: [facility] [facility]  08/01/2020  You are scheduled for a Cardiac Catheterization on Tuesday, June 21 with Dr. [name].  1. Please arrive at the [facility] (Main Entrance A) at [facility]: [address] [place], [place] [address] at 8:30 AM (This time is two hours before your procedure to ensure your preparation). Free valet parking service is available.   Special note: Every effort is made to have your procedure done on time. Please understand that emergencies sometimes delay scheduled procedures.  2. Diet: Do not eat solid foods after midnight.  The patient may have clear liquids until 5am upon the day of the procedure.  3. Labs: You will need to have blood drawn on TODAY.  4. Medication instructions in preparation for your procedure:   Contrast Allergy: No   On the morning of your procedure, take your Aspirin and any morning medicines NOT listed above.  You may use sips of water.  5. Plan for one night stay--bring personal belongings. 6. Bring a current list of your medications and current insurance cards. 7. You MUST have a  responsible person to drive you home. 8. Someone MUST be with you the first 24 hours after you arrive home or your discharge will be delayed. 9. Please wear clothes that are easy to get on and off and wear slip-on shoes.  Thank you for allowing [place] to care for you!   -- [facility]    Follow-Up: At [facility], you and your health needs are our priority.  As part of our continuing mission to provide you with exceptional heart care, we have created designated Provider Care Teams.  These Care Teams include your primary Cardiologist (physician) and Advanced Practice Providers (APPs -  Physician Assistants and Nurse Practitioners) who all work together to provide you with the care you need, when you need it.  We recommend signing up for the patient portal called "MyChart".  Sign up information is provided on this After Visit Summary.  MyChart is used to connect with patients for Virtual Visits (Telemedicine).  Patients are able to view lab/test results, encounter notes, upcoming appointments, etc.  Non-urgent messages can be sent to your provider as well.   To learn more about what you can do with MyChart, go to [email or web address].    Your next appointment:   2 week(s) after Cath  The format for your next appointment:   In Person  Provider:   Berniece Salines, DO   Other Instructions

## 2020-08-01 NOTE — H&P (View-Only) (Signed)
Cardiology Office Note:    Date:  08/01/2020   ID:  [name] Marrie Chandra, DOB [birth year], MRN [id number]  PCP:  [name], MD  Cardiologist:  None  Electrophysiologist:  None   Referring MD: [name], MD   No chief complaint on file. I still feel really fatigued and short of breath  History of Present Illness:    Francy Mcilvaine is a 70 y.o. female with a hx of hyperlipidemia, type 2 diabetes, hyperlipidemia breast cancer status postchemotherapy with Adriamycin 10 years ago is here today for follow-up visit. I first saw the patient on Jun 29, 2020 at that time she presented for worsening shortness of breath and exertion as well as chest discomfort.  During her visit given her risk factors I recommended she undergo a nuclear stress test as well as get an echocardiogram to assess for any structural abnormalities in the setting of her reported mitral valve prolapse per  In the interim the patient was able to get this testing done.  She is here today to discuss the result.   Past Medical History:  Diagnosis Date   Anxiety    Depression    Diabetes mellitus without complication ([place])    Encounter for Medicare annual wellness exam 10/19/2019   Gastroesophageal reflux disease 10/19/2019   GERD (gastroesophageal reflux disease)    Hyperlipidemia    Hyperlipidemia associated with type 2 diabetes mellitus ([place]) 10/19/2019   Mitral valve prolapse    Osteopenia 10/19/2019   Sinusitis 08/16/2019    Past Surgical History:  Procedure Laterality Date   BREAST LUMPECTOMY Left 2010    Current Medications: Current Meds  Medication Sig   aspirin EC 81 MG tablet Take 1 tablet (81 mg total) by mouth daily. Swallow whole.   FLUoxetine (PROZAC) 10 MG tablet Take 1 tablet (10 mg total) by mouth daily.   FLUoxetine (PROZAC) 20 MG capsule Take 1 capsule by mouth once daily   fluticasone (FLONASE) 50 MCG/ACT nasal spray Use 2 spray(s) in each nostril once daily   metoprolol  succinate (TOPROL XL) 25 MG 24 hr tablet Take 0.5 tablets (12.5 mg total) by mouth daily.   Multiple Vitamin (MULTIVITAMIN) capsule Take 1 capsule by mouth daily.   Omega-3 Fatty Acids (FISH OIL PO) Take 2 tablets by mouth daily. Strength unknown   omeprazole (PRILOSEC) 20 MG capsule Take 1 capsule by mouth once daily   rosuvastatin (CRESTOR) 40 MG tablet Take 1 tablet (40 mg total) by mouth daily.   sacubitril-valsartan (ENTRESTO) 24-26 MG Take 1 tablet by mouth 2 (two) times daily.   Vitamin D, Cholecalciferol, 25 MCG (1000 UT) CAPS Take 1 tablet by mouth daily.     Allergies:   Patient has no known allergies.   Social History   Socioeconomic History   Marital status: Married    Spouse name: Not on file   Number of children: 2   Years of education: Not on file   Highest education level: Master's degree (e.g., MA, MS, MEng, MEd, MSW, MBA)  Occupational History   Occupation: Retired  Tobacco Use   Smoking status: Never   Smokeless tobacco: Never  Vaping Use   Vaping Use: Never used  Substance and Sexual Activity   Alcohol use: Yes    Alcohol/week: 2.0 standard drinks    Types: 2 Glasses of wine per week    Comment: Everyday   Drug use: Never   Sexual activity: Not Currently  Other Topics Concern   Not  on file  Social History Narrative   Not on file   Social Determinants of Health   Financial Resource Strain: Not on file  Food Insecurity: Not on file  Transportation Needs: Not on file  Physical Activity: Not on file  Stress: Not on file  Social Connections: Not on file     Family History: The patient's family history includes Coronary artery disease in her father; Diabetes in her maternal grandfather and sister; Osteoporosis in her mother.  ROS:   Review of Systems  Constitution: Negative for decreased appetite, fever and weight gain.  HENT: Negative for congestion, ear discharge, hoarse voice and sore throat.   Eyes: Negative for discharge, redness, vision loss  in right eye and visual halos.  Cardiovascular: Negative for chest pain, dyspnea on exertion, leg swelling, orthopnea and palpitations.  Respiratory: Negative for cough, hemoptysis, shortness of breath and snoring.   Endocrine: Negative for heat intolerance and polyphagia.  Hematologic/Lymphatic: Negative for bleeding problem. Does not bruise/bleed easily.  Skin: Negative for flushing, nail changes, rash and suspicious lesions.  Musculoskeletal: Negative for arthritis, joint pain, muscle cramps, myalgias, neck pain and stiffness.  Gastrointestinal: Negative for abdominal pain, bowel incontinence, diarrhea and excessive appetite.  Genitourinary: Negative for decreased libido, genital sores and incomplete emptying.  Neurological: Negative for brief paralysis, focal weakness, headaches and loss of balance.  Psychiatric/Behavioral: Negative for altered mental status, depression and suicidal ideas.  Allergic/Immunologic: Negative for HIV exposure and persistent infections.    EKGs/Labs/Other Studies Reviewed:    The following studies were reviewed today:   EKG:  The ekg ordered today demonstrates   Transthoracic echocardiogram July 24, 2020 IMPRESSIONS     1. Left ventricular ejection fraction, by estimation, is 35 to 40%. The  left ventricle has moderate to severely decreased function. The left  ventricle demonstrates global hypokinesis. There is mild concentric left  ventricular hypertrophy. Left  ventricular diastolic parameters are consistent with Grade II diastolic  dysfunction (pseudonormalization). Elevated left atrial pressure.   2. Right ventricular systolic function is mildly reduced. The right  ventricular size is normal. There is normal pulmonary artery systolic  pressure.   3. The mitral valve is degenerative. Mild mitral valve regurgitation. No  evidence of mitral stenosis.   4. The aortic valve is tricuspid. Aortic valve regurgitation is not  visualized. No aortic  stenosis is present.   5. The inferior vena cava is normal in size with greater than 50%  respiratory variability, suggesting right atrial pressure of 3 mmHg.   FINDINGS   Left Ventricle: Left ventricular ejection fraction, by estimation, is 35  to 40%. The left ventricle has moderate to severely decreased function.  The left ventricle demonstrates global hypokinesis. The left ventricular  internal cavity size was normal in  size. There is mild concentric left ventricular hypertrophy. Abnormal  (paradoxical) septal motion, consistent with left bundle branch block.  Left ventricular diastolic parameters are consistent with Grade II  diastolic dysfunction (pseudonormalization).  Elevated left atrial pressure.   Right Ventricle: The right ventricular size is normal. No increase in  right ventricular wall thickness. Right ventricular systolic function is  mildly reduced. There is normal pulmonary artery systolic pressure. The  tricuspid regurgitant velocity is 2.39  m/s, and with an assumed right atrial pressure of 3 mmHg, the estimated  right ventricular systolic pressure is 26.7 mmHg.   Left Atrium: Left atrial size was normal in size.   Right Atrium: Right atrial size was normal in size.  Pericardium: There is no evidence of pericardial effusion.   Mitral Valve: The mitral valve is degenerative in appearance. There is  mild thickening of the mitral valve leaflet(s). Mild mitral annular  calcification. Mild mitral valve regurgitation. No evidence of mitral  valve stenosis.   Tricuspid Valve: The tricuspid valve is normal in structure. Tricuspid  valve regurgitation is mild . No evidence of tricuspid stenosis.   Aortic Valve: The aortic valve is tricuspid. Aortic valve regurgitation is  not visualized. No aortic stenosis is present.   Pulmonic Valve: The pulmonic valve was normal in structure. Pulmonic valve  regurgitation is not visualized. No evidence of pulmonic stenosis.    Aorta: The aortic root, ascending aorta, aortic arch and descending aorta  are all structurally normal, with no evidence of dilitation or  obstruction.   Venous: A systolic blunting flow pattern is recorded from the right upper  pulmonary vein. The inferior vena cava is normal in size with greater than  50% respiratory variability, suggesting right atrial pressure of 3 mmHg.   IAS/Shunts: No atrial level shunt detected by color flow Doppler.    Nuclear stress test Jul 14, 2019 The left ventricular ejection fraction is moderately decreased (30-44%). Nuclear stress EF: 42%. There was no ST segment deviation noted during stress. Findings consistent with prior myocardial infarction. This is an intermediate risk study.   Recent Labs: 06/01/2020: ALT 20; BUN 13; Creatinine, Ser 0.73; Hemoglobin 12.4; Platelets 284; Potassium CANCELED; Sodium 140; TSH 2.150  Recent Lipid Panel    Component Value Date/Time   CHOL 199 10/19/2019 0908   TRIG 89 10/19/2019 0908   HDL 76 10/19/2019 0908   CHOLHDL 2.6 10/19/2019 0908   LDLCALC 107 (H) 10/19/2019 0908    Physical Exam:    VS:  BP 140/72   Pulse 68   Ht 5\' 7"  (1.702 m)   Wt 133 lb (60.3 kg)   SpO2 98%   BMI 20.83 kg/m     Wt Readings from Last 3 Encounters:  08/01/20 133 lb (60.3 kg)  07/13/20 136 lb (61.7 kg)  06/29/20 136 lb 9.6 oz (62 kg)     GEN: Well nourished, well developed in no acute distress HEENT: Normal NECK: No JVD; No carotid bruits LYMPHATICS: No lymphadenopathy CARDIAC: S1S2 noted,RRR, no murmurs, rubs, gallops RESPIRATORY:  Clear to auscultation without rales, wheezing or rhonchi  ABDOMEN: Soft, non-tender, non-distended, +bowel sounds, no guarding. EXTREMITIES: No edema, No cyanosis, no clubbing MUSCULOSKELETAL:  No deformity  SKIN: Warm and dry NEUROLOGIC:  Alert and oriented x 3, non-focal PSYCHIATRIC:  Normal affect, good insight  ASSESSMENT:    1. Depressed left ventricular ejection fraction    2. Hyperlipidemia associated with type 2 diabetes mellitus ([place])   3. Hypertension, unspecified type   4. Dyspnea on exertion   5. Dilated cardiomyopathy ([place])    PLAN:    I spoke with the patient about her testing result.  Her echocardiogram that showed evidence of depressed ejection fraction and global hypokinesis.  In addition she does have evidence of myocardial infarction with no ischemia on her nuclear stress test and a depressed EF as well.  She has never had an ischemic evaluation, I would like to rule out ischemic etiology for this cardiomyopathy.  Bearing in mind that the patient 10 years ago was treated with Adriamycin.  I educated patient about the procedure.  She is agreeable to proceed with this. The patient understands that risks include but are not limited to stroke (1  in 1000), death (1 in 88), kidney failure  (1 in 500), bleeding (1 in 200), allergic reaction [possibly serious] (1 in 200), and agrees to proceed.  In the meantime I have started the patient prior on low-dose.  Blocker.  Her blood pressure acceptable today we will start Entresto 24-26 mg twice daily.  Hyperlipidemia - continue with current statin medication.  Recent hemoglobin A1c 5.8.  The patient is diet controlled.  The patient is in agreement with the above plan. The patient left the office in stable condition.  The patient will follow up in 2 weeks post cath   Medication Adjustments/Labs and Tests Ordered: Current medicines are reviewed at length with the patient today.  Concerns regarding medicines are outlined above.  Orders Placed This Encounter  Procedures   Basic Metabolic Panel (BMET)   Magnesium   CBC with Differential/Platelet    Meds ordered this encounter  Medications   sacubitril-valsartan (ENTRESTO) 24-26 MG    Sig: Take 1 tablet by mouth 2 (two) times daily.    Dispense:  180 tablet    Refill:  3     Patient Instructions  Medication Instructions:  Your physician has  recommended you make the following change in your medication: START: Entresto 24-26 mg twice daily *If you need a refill on your cardiac medications before your next appointment, please call your pharmacy*   Lab Work: Your physician recommends that you return for lab work:  TODAY: BMET, Mag, CBC If you have labs (blood work) drawn today and your tests are completely normal, you will receive your results only by: MyChart Message (if you have MyChart) OR A paper copy in the mail If you have any lab test that is abnormal or we need to change your treatment, we will call you to review the results.   Testing/Procedures:    Ronald [place] [phone] Dept: [phone] Loc: [place] [facility]aila [facility]unert  08/01/2020  You are scheduled for a Cardiac Catheterization on Tuesday, June 21 with Dr. [name].  1. Please arrive at the [facility] (Main Entrance A) at [facility]: [address] [place], [place] [address] at 8:30 AM (This time is two hours before your procedure to ensure your preparation). Free valet parking service is available.   Special note: Every effort is made to have your procedure done on time. Please understand that emergencies sometimes delay scheduled procedures.  2. Diet: Do not eat solid foods after midnight.  The patient may have clear liquids until 5am upon the day of the procedure.  3. Labs: You will need to have blood drawn on TODAY.  4. Medication instructions in preparation for your procedure:   Contrast Allergy: No   On the morning of your procedure, take your Aspirin and any morning medicines NOT listed above.  You may use sips of water.  5. Plan for one night stay--bring personal belongings. 6. Bring a current list of your medications and current insurance cards. 7. You MUST have a responsible person to drive you home. 8.  Someone MUST be with you the first 24 hours after you arrive home or your discharge will be delayed. 9. Please wear clothes that are easy to get on and off and wear slip-on shoes.  Thank you for allowing [place] to care for you!   -- [facility]    Follow-Up: At [facility], you and  your health needs are our priority.  As part of our continuing mission to provide you with exceptional heart care, we have created designated Provider Care Teams.  These Care Teams include your primary Cardiologist (physician) and Advanced Practice Providers (APPs -  Physician Assistants and Nurse Practitioners) who all work together to provide you with the care you need, when you need it.  We recommend signing up for the patient portal called "MyChart".  Sign up information is provided on this After Visit Summary.  MyChart is used to connect with patients for Virtual Visits (Telemedicine).  Patients are able to view lab/test results, encounter notes, upcoming appointments, etc.  Non-urgent messages can be sent to your provider as well.   To learn more about what you can do with MyChart, go to [email or web address].    Your next appointment:   2 week(s) after Cath  The format for your next appointment:   In Person  Provider:   Berniece Salines, DO   Other Instructions    Adopting a Healthy Lifestyle.  Know what a healthy weight is for you (roughly BMI <25) and aim to maintain this   Aim for 7+ servings of fruits and vegetables daily   65-80+ fluid ounces of water or unsweet tea for healthy kidneys   Limit to max 1 drink of alcohol per day; avoid smoking/tobacco   Limit animal fats in diet for cholesterol and heart health - choose grass fed whenever available   Avoid highly processed foods, and foods high in saturated/trans fats   Aim for low stress - take time to unwind and care for your mental health   Aim for 150 min of moderate intensity exercise weekly for heart  health, and weights twice weekly for bone health   Aim for 7-9 hours of sleep daily   When it comes to diets, agreement about the perfect plan isnt easy to find, even among the experts. Experts at the [place] developed an idea known as the Healthy Eating Plate. Just imagine a plate divided into logical, healthy portions.   The emphasis is on diet quality:   Load up on vegetables and fruits - one-half of your plate: Aim for color and variety, and remember that potatoes dont count.   Go for whole grains - one-quarter of your plate: Whole wheat, barley, wheat berries, quinoa, oats, brown rice, and foods made with them. If you want pasta, go with whole wheat pasta.   Protein power - one-quarter of your plate: Fish, chicken, beans, and nuts are all healthy, versatile protein sources. Limit red meat.   The diet, however, does go beyond the plate, offering a few other suggestions.   Use healthy plant oils, such as olive, canola, soy, corn, sunflower and peanut. Check the labels, and avoid partially hydrogenated oil, which have unhealthy trans fats.   If youre thirsty, drink water. Coffee and tea are good in moderation, but skip sugary drinks and limit milk and dairy products to one or two daily servings.   The type of carbohydrate in the diet is more important than the amount. Some sources of carbohydrates, such as vegetables, fruits, whole grains, and beans-are healthier than others.   Finally, stay active  Signed, Berniece Salines, DO  08/01/2020 11:51 AM    [place]

## 2020-08-01 NOTE — Progress Notes (Signed)
Cardiology Office Note:    Date:  08/01/2020   ID:  Audrey Ruiz, DOB 07/07/1950, MRN [id number]  PCP:  Audrey Brome, MD  Cardiologist:  None  Electrophysiologist:  None   Referring MD: Audrey Brome, MD   No chief complaint on file. I still feel really fatigued and short of breath  History of Present Illness:    Audrey Ruiz is a 70 y.o. female with a hx of hyperlipidemia, type 2 diabetes, hyperlipidemia breast cancer status postchemotherapy with Adriamycin 10 years ago is here today for follow-up visit. I first saw the patient on Jun 29, 2020 at that time she presented for worsening shortness of breath and exertion as well as chest discomfort.  During her visit given her risk factors I recommended she undergo a nuclear stress test as well as get an echocardiogram to assess for any structural abnormalities in the setting of her reported mitral valve prolapse per  In the interim the patient was able to get this testing done.  She is here today to discuss the result.   Past Medical History:  Diagnosis Date   Anxiety    Depression    Diabetes mellitus without complication ([place])    Encounter for Medicare annual wellness exam 10/19/2019   Gastroesophageal reflux disease 10/19/2019   GERD (gastroesophageal reflux disease)    Hyperlipidemia    Hyperlipidemia associated with type 2 diabetes mellitus ([place]) 10/19/2019   Mitral valve prolapse    Osteopenia 10/19/2019   Sinusitis 08/16/2019    Past Surgical History:  Procedure Laterality Date   BREAST LUMPECTOMY Left 2010    Current Medications: Current Meds  Medication Sig   aspirin EC 81 MG tablet Take 1 tablet (81 mg total) by mouth daily. Swallow whole.   FLUoxetine (PROZAC) 10 MG tablet Take 1 tablet (10 mg total) by mouth daily.   FLUoxetine (PROZAC) 20 MG capsule Take 1 capsule by mouth once daily   fluticasone (FLONASE) 50 MCG/ACT nasal spray Use 2 spray(s) in each nostril once daily   metoprolol  succinate (TOPROL XL) 25 MG 24 hr tablet Take 0.5 tablets (12.5 mg total) by mouth daily.   Multiple Vitamin (MULTIVITAMIN) capsule Take 1 capsule by mouth daily.   Omega-3 Fatty Acids (FISH OIL PO) Take 2 tablets by mouth daily. Strength unknown   omeprazole (PRILOSEC) 20 MG capsule Take 1 capsule by mouth once daily   rosuvastatin (CRESTOR) 40 MG tablet Take 1 tablet (40 mg total) by mouth daily.   sacubitril-valsartan (ENTRESTO) 24-26 MG Take 1 tablet by mouth 2 (two) times daily.   Vitamin D, Cholecalciferol, 25 MCG (1000 UT) CAPS Take 1 tablet by mouth daily.     Allergies:   Patient has no known allergies.   Social History   Socioeconomic History   Marital status: Married    Spouse name: Not on file   Number of children: 2   Years of education: Not on file   Highest education level: Master's degree (e.g., MA, MS, MEng, MEd, MSW, MBA)  Occupational History   Occupation: Retired  Tobacco Use   Smoking status: Never   Smokeless tobacco: Never  Vaping Use   Vaping Use: Never used  Substance and Sexual Activity   Alcohol use: Yes    Alcohol/week: 2.0 standard drinks    Types: 2 Glasses of wine per week    Comment: Everyday   Drug use: Never   Sexual activity: Not Currently  Other Topics Concern   Not  on file  Social History Narrative   Not on file   Social Determinants of Health   Financial Resource Strain: Not on file  Food Insecurity: Not on file  Transportation Needs: Not on file  Physical Activity: Not on file  Stress: Not on file  Social Connections: Not on file     Family History: The patient's family history includes Coronary artery disease in her father; Diabetes in her maternal grandfather and sister; Osteoporosis in her mother.  ROS:   Review of Systems  Constitution: Negative for decreased appetite, fever and weight gain.  HENT: Negative for congestion, ear discharge, hoarse voice and sore throat.   Eyes: Negative for discharge, redness, vision loss  in right eye and visual halos.  Cardiovascular: Negative for chest pain, dyspnea on exertion, leg swelling, orthopnea and palpitations.  Respiratory: Negative for cough, hemoptysis, shortness of breath and snoring.   Endocrine: Negative for heat intolerance and polyphagia.  Hematologic/Lymphatic: Negative for bleeding problem. Does not bruise/bleed easily.  Skin: Negative for flushing, nail changes, rash and suspicious lesions.  Musculoskeletal: Negative for arthritis, joint pain, muscle cramps, myalgias, neck pain and stiffness.  Gastrointestinal: Negative for abdominal pain, bowel incontinence, diarrhea and excessive appetite.  Genitourinary: Negative for decreased libido, genital sores and incomplete emptying.  Neurological: Negative for brief paralysis, focal weakness, headaches and loss of balance.  Psychiatric/Behavioral: Negative for altered mental status, depression and suicidal ideas.  Allergic/Immunologic: Negative for HIV exposure and persistent infections.    EKGs/Labs/Other Studies Reviewed:    The following studies were reviewed today:   EKG:  The ekg ordered today demonstrates   Transthoracic echocardiogram July 24, 2020 IMPRESSIONS     1. Left ventricular ejection fraction, by estimation, is 35 to 40%. The  left ventricle has moderate to severely decreased function. The left  ventricle demonstrates global hypokinesis. There is mild concentric left  ventricular hypertrophy. Left  ventricular diastolic parameters are consistent with Grade II diastolic  dysfunction (pseudonormalization). Elevated left atrial pressure.   2. Right ventricular systolic function is mildly reduced. The right  ventricular size is normal. There is normal pulmonary artery systolic  pressure.   3. The mitral valve is degenerative. Mild mitral valve regurgitation. No  evidence of mitral stenosis.   4. The aortic valve is tricuspid. Aortic valve regurgitation is not  visualized. No aortic  stenosis is present.   5. The inferior vena cava is normal in size with greater than 50%  respiratory variability, suggesting right atrial pressure of 3 mmHg.   FINDINGS   Left Ventricle: Left ventricular ejection fraction, by estimation, is 35  to 40%. The left ventricle has moderate to severely decreased function.  The left ventricle demonstrates global hypokinesis. The left ventricular  internal cavity size was normal in  size. There is mild concentric left ventricular hypertrophy. Abnormal  (paradoxical) septal motion, consistent with left bundle branch block.  Left ventricular diastolic parameters are consistent with Grade II  diastolic dysfunction (pseudonormalization).  Elevated left atrial pressure.   Right Ventricle: The right ventricular size is normal. No increase in  right ventricular wall thickness. Right ventricular systolic function is  mildly reduced. There is normal pulmonary artery systolic pressure. The  tricuspid regurgitant velocity is 2.39  m/s, and with an assumed right atrial pressure of 3 mmHg, the estimated  right ventricular systolic pressure is 54.6 mmHg.   Left Atrium: Left atrial size was normal in size.   Right Atrium: Right atrial size was normal in size.  Pericardium: There is no evidence of pericardial effusion.   Mitral Valve: The mitral valve is degenerative in appearance. There is  mild thickening of the mitral valve leaflet(s). Mild mitral annular  calcification. Mild mitral valve regurgitation. No evidence of mitral  valve stenosis.   Tricuspid Valve: The tricuspid valve is normal in structure. Tricuspid  valve regurgitation is mild . No evidence of tricuspid stenosis.   Aortic Valve: The aortic valve is tricuspid. Aortic valve regurgitation is  not visualized. No aortic stenosis is present.   Pulmonic Valve: The pulmonic valve was normal in structure. Pulmonic valve  regurgitation is not visualized. No evidence of pulmonic stenosis.    Aorta: The aortic root, ascending aorta, aortic arch and descending aorta  are all structurally normal, with no evidence of dilitation or  obstruction.   Venous: A systolic blunting flow pattern is recorded from the right upper  pulmonary vein. The inferior vena cava is normal in size with greater than  50% respiratory variability, suggesting right atrial pressure of 3 mmHg.   IAS/Shunts: No atrial level shunt detected by color flow Doppler.    Nuclear stress test Jul 14, 2019 The left ventricular ejection fraction is moderately decreased (30-44%). Nuclear stress EF: 42%. There was no ST segment deviation noted during stress. Findings consistent with prior myocardial infarction. This is an intermediate risk study.   Recent Labs: 06/01/2020: ALT 20; BUN 13; Creatinine, Ser 0.73; Hemoglobin 12.4; Platelets 284; Potassium CANCELED; Sodium 140; TSH 2.150  Recent Lipid Panel    Component Value Date/Time   CHOL 199 10/19/2019 0908   TRIG 89 10/19/2019 0908   HDL 76 10/19/2019 0908   CHOLHDL 2.6 10/19/2019 0908   LDLCALC 107 (H) 10/19/2019 0908    Physical Exam:    VS:  BP 140/72   Pulse 68   Ht 5\' 7"  (1.702 m)   Wt 133 lb (60.3 kg)   SpO2 98%   BMI 20.83 kg/m     Wt Readings from Last 3 Encounters:  08/01/20 133 lb (60.3 kg)  07/13/20 136 lb (61.7 kg)  06/29/20 136 lb 9.6 oz (62 kg)     GEN: Well nourished, well developed in no acute distress HEENT: Normal NECK: No JVD; No carotid bruits LYMPHATICS: No lymphadenopathy CARDIAC: S1S2 noted,RRR, no murmurs, rubs, gallops RESPIRATORY:  Clear to auscultation without rales, wheezing or rhonchi  ABDOMEN: Soft, non-tender, non-distended, +bowel sounds, no guarding. EXTREMITIES: No edema, No cyanosis, no clubbing MUSCULOSKELETAL:  No deformity  SKIN: Warm and dry NEUROLOGIC:  Alert and oriented x 3, non-focal PSYCHIATRIC:  Normal affect, good insight  ASSESSMENT:    1. Depressed left ventricular ejection fraction    2. Hyperlipidemia associated with type 2 diabetes mellitus ([place])   3. Hypertension, unspecified type   4. Dyspnea on exertion   5. Dilated cardiomyopathy ([place])    PLAN:    I spoke with the patient about her testing result.  Her echocardiogram that showed evidence of depressed ejection fraction and global hypokinesis.  In addition she does have evidence of myocardial infarction with no ischemia on her nuclear stress test and a depressed EF as well.  She has never had an ischemic evaluation, I would like to rule out ischemic etiology for this cardiomyopathy.  Bearing in mind that the patient 10 years ago was treated with Adriamycin.  I educated patient about the procedure.  She is agreeable to proceed with this. The patient understands that risks include but are not limited to stroke (1  in 1000), death (1 in 60), kidney failure  (1 in 500), bleeding (1 in 200), allergic reaction [possibly serious] (1 in 200), and agrees to proceed.  In the meantime I have started the patient prior on low-dose.  Blocker.  Her blood pressure acceptable today we will start Entresto 24-26 mg twice daily.  Hyperlipidemia - continue with current statin medication.  Recent hemoglobin A1c 5.8.  The patient is diet controlled.  The patient is in agreement with the above plan. The patient left the office in stable condition.  The patient will follow up in 2 weeks post cath   Medication Adjustments/Labs and Tests Ordered: Current medicines are reviewed at length with the patient today.  Concerns regarding medicines are outlined above.  Orders Placed This Encounter  Procedures   Basic Metabolic Panel (BMET)   Magnesium   CBC with Differential/Platelet    Meds ordered this encounter  Medications   sacubitril-valsartan (ENTRESTO) 24-26 MG    Sig: Take 1 tablet by mouth 2 (two) times daily.    Dispense:  180 tablet    Refill:  3     Patient Instructions  Medication Instructions:  Your physician has  recommended you make the following change in your medication: START: Entresto 24-26 mg twice daily *If you need a refill on your cardiac medications before your next appointment, please call your pharmacy*   Lab Work: Your physician recommends that you return for lab work:  TODAY: BMET, Mag, CBC If you have labs (blood work) drawn today and your tests are completely normal, you will receive your results only by: MyChart Message (if you have MyChart) OR A paper copy in the mail If you have any lab test that is abnormal or we need to change your treatment, we will call you to review the results.   Testing/Procedures:    [place] [id number] Dept: [phone] Loc: [place]  08/01/2020  You are scheduled for a Cardiac Catheterization on Tuesday, June 21 with Dr. [name].  1. Please arrive at the [facility] (Main Entrance A) at [facility]: [address] [place], [place] [address] at 8:30 AM (This time is two hours before your procedure to ensure your preparation). Free valet parking service is available.   Special note: Every effort is made to have your procedure done on time. Please understand that emergencies sometimes delay scheduled procedures.  2. Diet: Do not eat solid foods after midnight.  The patient may have clear liquids until 5am upon the day of the procedure.  3. Labs: You will need to have blood drawn on TODAY.  4. Medication instructions in preparation for your procedure:   Contrast Allergy: No   On the morning of your procedure, take your Aspirin and any morning medicines NOT listed above.  You may use sips of water.  5. Plan for one night stay--bring personal belongings. 6. Bring a current list of your medications and current insurance cards. 7. You MUST have a responsible person to drive you home. 8.  Someone MUST be with you the first 24 hours after you arrive home or your discharge will be delayed. 9. Please wear clothes that are easy to get on and off and wear slip-on shoes.  Thank you for allowing [place] to care for you!   -- [place] [facility]    Follow-Up: At [facility], you and  your health needs are our priority.  As part of our continuing mission to provide you with exceptional heart care, we have created designated Provider Care Teams.  These Care Teams include your primary Cardiologist (physician) and Advanced Practice Providers (APPs -  Physician Assistants and Nurse Practitioners) who all work together to provide you with the care you need, when you need it.  We recommend signing up for the patient portal called "MyChart".  Sign up information is provided on this After Visit Summary.  MyChart is used to connect with patients for Virtual Visits (Telemedicine).  Patients are able to view lab/test results, encounter notes, upcoming appointments, etc.  Non-urgent messages can be sent to your provider as well.   To learn more about what you can do with MyChart, go to [email or web address].    Your next appointment:   2 week(s) after Cath  The format for your next appointment:   In Person  Provider:   Berniece Salines, DO   Other Instructions    Adopting a Healthy Lifestyle.  Know what a healthy weight is for you (roughly BMI <25) and aim to maintain this   Aim for 7+ servings of fruits and vegetables daily   65-80+ fluid ounces of water or unsweet tea for healthy kidneys   Limit to max 1 drink of alcohol per day; avoid smoking/tobacco   Limit animal fats in diet for cholesterol and heart health - choose grass fed whenever available   Avoid highly processed foods, and foods high in saturated/trans fats   Aim for low stress - take time to unwind and care for your mental health   Aim for 150 min of moderate intensity exercise weekly for heart  health, and weights twice weekly for bone health   Aim for 7-9 hours of sleep daily   When it comes to diets, agreement about the perfect plan isnt easy to find, even among the experts. Experts at the [employer] [employer] developed an idea known as the Healthy Eating Plate. Just imagine a plate divided into logical, healthy portions.   The emphasis is on diet quality:   Load up on vegetables and fruits - one-half of your plate: Aim for color and variety, and remember that potatoes dont count.   Go for whole grains - one-quarter of your plate: Whole wheat, barley, wheat berries, quinoa, oats, brown rice, and foods made with them. If you want pasta, go with whole wheat pasta.   Protein power - one-quarter of your plate: Fish, chicken, beans, and nuts are all healthy, versatile protein sources. Limit red meat.   The diet, however, does go beyond the plate, offering a few other suggestions.   Use healthy plant oils, such as olive, canola, soy, corn, sunflower and peanut. Check the labels, and avoid partially hydrogenated oil, which have unhealthy trans fats.   If youre thirsty, drink water. Coffee and tea are good in moderation, but skip sugary drinks and limit milk and dairy products to one or two daily servings.   The type of carbohydrate in the diet is more important than the amount. Some sources of carbohydrates, such as vegetables, fruits, whole grains, and beans-are healthier than others.   Finally, stay active  Signed, Berniece Salines, DO  08/01/2020 11:51 AM    [place]

## 2020-08-02 ENCOUNTER — Telehealth: Payer: Self-pay

## 2020-08-02 ENCOUNTER — Telehealth: Payer: Self-pay | Admitting: Cardiology

## 2020-08-02 LAB — BASIC METABOLIC PANEL
BUN/Creatinine Ratio: 18 (ref 12–28)
BUN: 13 mg/dL (ref 8–27)
CO2: 22 mmol/L (ref 20–29)
Calcium: 11.2 mg/dL — ABNORMAL HIGH (ref 8.7–10.3)
Chloride: 104 mmol/L (ref 96–106)
Creatinine, Ser: 0.73 mg/dL (ref 0.57–1.00)
Glucose: 74 mg/dL (ref 65–99)
Potassium: 4.9 mmol/L (ref 3.5–5.2)
Sodium: 140 mmol/L (ref 134–144)
eGFR: 88 mL/min/{1.73_m2} (ref >59)

## 2020-08-02 LAB — CBC WITH DIFFERENTIAL/PLATELET
Basophils Absolute: 0.1 10*3/uL (ref 0.0–0.2)
Basos: 1 %
EOS (ABSOLUTE): 0.1 10*3/uL (ref 0.0–0.4)
Eos: 2 %
Hematocrit: 41.7 % (ref 34.0–46.6)
Hemoglobin: 13.5 g/dL (ref 11.1–15.9)
Immature Grans (Abs): 0 10*3/uL (ref 0.0–0.1)
Immature Granulocytes: 0 %
Lymphocytes Absolute: 2 10*3/uL (ref 0.7–3.1)
Lymphs: 35 %
MCH: 31.3 pg (ref 26.6–33.0)
MCHC: 32.4 g/dL (ref 31.5–35.7)
MCV: 97 fL (ref 79–97)
Monocytes Absolute: 0.4 10*3/uL (ref 0.1–0.9)
Monocytes: 7 %
Neutrophils Absolute: 3.1 10*3/uL (ref 1.4–7.0)
Neutrophils: 55 %
Platelets: 280 10*3/uL (ref 150–450)
RBC: 4.31 x10E6/uL (ref 3.77–5.28)
RDW: 12 % (ref 11.7–15.4)
WBC: 5.6 10*3/uL (ref 3.4–10.8)

## 2020-08-02 LAB — MAGNESIUM: Magnesium: 2 mg/dL (ref 1.6–2.3)

## 2020-08-02 NOTE — Telephone Encounter (Signed)
Spoke with patient about the [facility] patient assistance form. She states there is no where on the paperwork asking for income or pharmacy receipts. She states she will go to the office to make sure she received the entire packet, "it just doesn't;t seen right." No further questions or concerns at this time.

## 2020-08-02 NOTE — Telephone Encounter (Signed)
Patient has some questions about filling out her entresto financial assistance form.

## 2020-08-02 NOTE — Telephone Encounter (Signed)
Spoke with patient, see chart.    

## 2020-08-04 ENCOUNTER — Telehealth: Payer: Self-pay

## 2020-08-04 NOTE — Telephone Encounter (Signed)
Spoke with patient regarding results and recommendation.  Patient verbalizes understanding and is agreeable to plan of care. Advised patient to call back with any issues or concerns.  

## 2020-08-04 NOTE — Telephone Encounter (Signed)
-----   Message from Berniece Salines, DO sent at 08/04/2020 11:03 AM EDT ----- Calcium level slightly elevated 11.2 you might be dehydrated

## 2020-08-06 ENCOUNTER — Encounter (HOSPITAL_COMMUNITY): Payer: Self-pay | Admitting: Cardiology

## 2020-08-07 ENCOUNTER — Telehealth: Payer: Self-pay | Admitting: *Deleted

## 2020-08-07 NOTE — Telephone Encounter (Signed)
Pt contacted pre-catheterization scheduled at [facility] for: Tuesday August 08, 2020 10:30 AM Verified arrival time and place: [facility]) at: 8:30 AM   No solid food after midnight prior to cath, clear liquids until 5 AM day of procedure.   AM meds can be  taken pre-cath with sips of water including: ASA 81 mg   Confirmed patient has responsible adult to drive home post procedure and be with patient first 24 hours after arriving home: yes  You are allowed ONE visitor in the waiting room during the time you are at the hospital for your procedure. Both you and your visitor must wear a mask once you enter the hospital.   Patient reports does not currently have any symptoms concerning for COVID-19 and no household members with COVID-19 like illness.       Reviewed procedure/mask/visitor instructions with patient.

## 2020-08-08 ENCOUNTER — Ambulatory Visit (HOSPITAL_COMMUNITY)
Admission: RE | Admit: 2020-08-08 | Discharge: 2020-08-08 | Disposition: A | Discharge status: Home / Self Care | Payer: PPO | Attending: Cardiology | Admitting: Cardiology

## 2020-08-08 ENCOUNTER — Encounter (HOSPITAL_COMMUNITY)
Admission: RE | Disposition: A | Discharge status: Home / Self Care | Payer: Self-pay | Source: Home / Self Care | Attending: Cardiology

## 2020-08-08 ENCOUNTER — Other Ambulatory Visit: Payer: Self-pay

## 2020-08-08 DIAGNOSIS — R9439 Abnormal result of other cardiovascular function study: Secondary | ICD-10-CM

## 2020-08-08 DIAGNOSIS — I2510 Atherosclerotic heart disease of native coronary artery without angina pectoris: Secondary | ICD-10-CM | POA: Insufficient documentation

## 2020-08-08 DIAGNOSIS — R0609 Other forms of dyspnea: Secondary | ICD-10-CM | POA: Diagnosis not present

## 2020-08-08 DIAGNOSIS — Z833 Family history of diabetes mellitus: Secondary | ICD-10-CM | POA: Diagnosis not present

## 2020-08-08 DIAGNOSIS — Z8249 Family history of ischemic heart disease and other diseases of the circulatory system: Secondary | ICD-10-CM | POA: Diagnosis not present

## 2020-08-08 DIAGNOSIS — I447 Left bundle-branch block, unspecified: Secondary | ICD-10-CM | POA: Diagnosis not present

## 2020-08-08 DIAGNOSIS — Z006 Encounter for examination for normal comparison and control in clinical research program: Secondary | ICD-10-CM

## 2020-08-08 DIAGNOSIS — Z7982 Long term (current) use of aspirin: Secondary | ICD-10-CM | POA: Diagnosis not present

## 2020-08-08 DIAGNOSIS — I341 Nonrheumatic mitral (valve) prolapse: Secondary | ICD-10-CM | POA: Diagnosis not present

## 2020-08-08 DIAGNOSIS — I420 Dilated cardiomyopathy: Secondary | ICD-10-CM

## 2020-08-08 DIAGNOSIS — E785 Hyperlipidemia, unspecified: Secondary | ICD-10-CM | POA: Diagnosis not present

## 2020-08-08 DIAGNOSIS — Z79899 Other long term (current) drug therapy: Secondary | ICD-10-CM | POA: Diagnosis not present

## 2020-08-08 DIAGNOSIS — R7303 Prediabetes: Secondary | ICD-10-CM | POA: Diagnosis present

## 2020-08-08 DIAGNOSIS — R931 Abnormal findings on diagnostic imaging of heart and coronary circulation: Secondary | ICD-10-CM

## 2020-08-08 DIAGNOSIS — R0989 Other specified symptoms and signs involving the circulatory and respiratory systems: Secondary | ICD-10-CM

## 2020-08-08 DIAGNOSIS — I10 Essential (primary) hypertension: Secondary | ICD-10-CM | POA: Insufficient documentation

## 2020-08-08 DIAGNOSIS — E1169 Type 2 diabetes mellitus with other specified complication: Secondary | ICD-10-CM | POA: Diagnosis not present

## 2020-08-08 HISTORY — PX: LEFT HEART CATH AND CORONARY ANGIOGRAPHY: CATH118249

## 2020-08-08 HISTORY — DX: Abnormal result of other cardiovascular function study: R94.39

## 2020-08-08 HISTORY — DX: Dilated cardiomyopathy: I42.0

## 2020-08-08 LAB — GLUCOSE, CAPILLARY
Glucose-Capillary: 66 mg/dL — ABNORMAL LOW (ref 70–99)
Glucose-Capillary: 143 mg/dL — ABNORMAL HIGH (ref 70–99)

## 2020-08-08 SURGERY — LEFT HEART CATH AND CORONARY ANGIOGRAPHY
Anesthesia: LOCAL

## 2020-08-08 MED ORDER — HEPARIN (PORCINE) IN NACL 1000-0.9 UT/500ML-% IV SOLN
INTRAVENOUS | Status: AC
  Filled 2020-08-08: qty 1000

## 2020-08-08 MED ORDER — FENTANYL CITRATE (PF) 100 MCG/2ML IJ SOLN
INTRAMUSCULAR | Status: DC | PRN
  Administered 2020-08-08: 25 ug via INTRAVENOUS

## 2020-08-08 MED ORDER — MIDAZOLAM HCL 2 MG/2ML IJ SOLN
INTRAMUSCULAR | Status: DC | PRN
  Administered 2020-08-08: 1 mg via INTRAVENOUS

## 2020-08-08 MED ORDER — VERAPAMIL HCL 2.5 MG/ML IV SOLN
INTRAVENOUS | Status: DC | PRN
  Administered 2020-08-08: 10 mL via INTRA_ARTERIAL

## 2020-08-08 MED ORDER — SODIUM CHLORIDE 0.9 % IV SOLN: 250.0000 mL | INTRAVENOUS | Status: DC | PRN

## 2020-08-08 MED ORDER — HEPARIN SODIUM (PORCINE) 1000 UNIT/ML IJ SOLN
INTRAMUSCULAR | Status: AC
  Filled 2020-08-08: qty 1

## 2020-08-08 MED ORDER — FENTANYL CITRATE (PF) 100 MCG/2ML IJ SOLN
INTRAMUSCULAR | Status: AC
  Filled 2020-08-08: qty 2

## 2020-08-08 MED ORDER — ASPIRIN 81 MG PO CHEW: 81.0000 mg | CHEWABLE_TABLET | ORAL | Status: DC

## 2020-08-08 MED ORDER — VERAPAMIL HCL 2.5 MG/ML IV SOLN
INTRAVENOUS | Status: AC
  Filled 2020-08-08: qty 2

## 2020-08-08 MED ORDER — DEXTROSE 50 % IV SOLN
25.0000 mL | Freq: Once | INTRAVENOUS | Status: AC
  Administered 2020-08-08: 25 mL via INTRAVENOUS
  Filled 2020-08-08: qty 50

## 2020-08-08 MED ORDER — LIDOCAINE HCL (PF) 1 % IJ SOLN
INTRAMUSCULAR | Status: AC
  Filled 2020-08-08: qty 30

## 2020-08-08 MED ORDER — IOHEXOL 350 MG/ML SOLN
INTRAVENOUS | Status: DC | PRN
  Administered 2020-08-08: 40 mL via INTRA_ARTERIAL

## 2020-08-08 MED ORDER — MIDAZOLAM HCL 2 MG/2ML IJ SOLN
INTRAMUSCULAR | Status: AC
  Filled 2020-08-08: qty 2

## 2020-08-08 MED ORDER — NITROGLYCERIN 1 MG/10 ML FOR IR/CATH LAB
INTRA_ARTERIAL | Status: AC
  Filled 2020-08-08: qty 10

## 2020-08-08 MED ORDER — NITROGLYCERIN 1 MG/10 ML FOR IR/CATH LAB
INTRA_ARTERIAL | Status: DC | PRN
  Administered 2020-08-08: 200 ug via INTRACORONARY

## 2020-08-08 MED ORDER — HEPARIN SODIUM (PORCINE) 1000 UNIT/ML IJ SOLN
INTRAMUSCULAR | Status: DC | PRN
  Administered 2020-08-08: 3000 [IU] via INTRAVENOUS

## 2020-08-08 MED ORDER — HEPARIN (PORCINE) IN NACL 1000-0.9 UT/500ML-% IV SOLN
INTRAVENOUS | Status: DC | PRN
  Administered 2020-08-08 (×2): 500 mL

## 2020-08-08 MED ORDER — LIDOCAINE HCL (PF) 1 % IJ SOLN
INTRAMUSCULAR | Status: DC | PRN
  Administered 2020-08-08: 2 mL

## 2020-08-08 MED ORDER — SODIUM CHLORIDE 0.9% FLUSH
3.0000 mL | INTRAVENOUS | Status: DC | PRN
Start: 2020-08-08 — End: 2020-08-08

## 2020-08-08 MED ORDER — SODIUM CHLORIDE 0.9% FLUSH: 3.0000 mL | Freq: Two times a day (BID) | INTRAVENOUS | Status: DC

## 2020-08-08 MED ORDER — SODIUM CHLORIDE 0.9 % WEIGHT BASED INFUSION
3.0000 mL/kg/h | INTRAVENOUS | Status: AC
  Administered 2020-08-08: 3 mL/kg/h via INTRAVENOUS

## 2020-08-08 MED ORDER — SODIUM CHLORIDE 0.9 % WEIGHT BASED INFUSION: 1.0000 mL/kg/h | INTRAVENOUS | Status: DC

## 2020-08-08 SURGICAL SUPPLY — 10 items
CATH OPTITORQUE TIG 4.0 5F (CATHETERS) ×1 IMPLANT
DEVICE RAD TR BAND REGULAR (VASCULAR PRODUCTS) ×1 IMPLANT
GLIDESHEATH SLEND SS 6F .021 (SHEATH) ×1 IMPLANT
GUIDEWIRE INQWIRE 1.5J.035X260 (WIRE) IMPLANT
INQWIRE 1.5J .035X260CM (WIRE) ×2
KIT HEART LEFT (KITS) ×2 IMPLANT
PACK CARDIAC CATHETERIZATION (CUSTOM PROCEDURE TRAY) ×2 IMPLANT
SHEATH PROBE COVER 6X72 (BAG) ×1 IMPLANT
TRANSDUCER W/STOPCOCK (MISCELLANEOUS) ×2 IMPLANT
TUBING CIL FLEX 10 FLL-RA (TUBING) ×2 IMPLANT

## 2020-08-08 NOTE — Discharge Instructions (Signed)
Radial Site Care  This sheet gives you information about how to care for yourself after your procedure. Your health care provider may also give you more specific instructions. If you have problems or questions, contact your health care provider. What can I expect after the procedure? After the procedure, it is common to have: Bruising and tenderness at the catheter insertion area. Follow these instructions at home: Medicines Take over-the-counter and prescription medicines only as told by your health care provider. Insertion site care Follow instructions from your health care provider about how to take care of your insertion site. Make sure you: Wash your hands with soap and water before you remove your bandage (dressing). If soap and water are not available, use hand sanitizer. May remove dressing in 24 hours. Check your insertion site every day for signs of infection. Check for: Redness, swelling, or pain. Fluid or blood. Pus or a bad smell. Warmth. Do no take baths, swim, or use a hot tub for 5 days. You may shower 24-48 hours after the procedure. Remove the dressing and gently wash the site with plain soap and water. Pat the area dry with a clean towel. Do not rub the site. That could cause bleeding. Do not apply powder or lotion to the site. Activity  For 24 hours after the procedure, or as directed by your health care provider: Do not flex or bend the affected arm. Do not push or pull heavy objects with the affected arm. Do not drive yourself home from the hospital or clinic. You may drive 24 hours after the procedure. Do not operate machinery or power tools. KEEP ARM ELEVATED THE REMAINDER OF THE DAY. Do not push, pull or lift anything that is heavier than 10 lb for 5 days. Ask your health care provider when it is okay to: Return to work or school. Resume usual physical activities or sports. Resume sexual activity. General instructions If the catheter site starts to  bleed, raise your arm and put firm pressure on the site. If the bleeding does not stop, get help right away. This is a medical emergency. DRINK PLENTY OF FLUIDS FOR THE NEXT 2-3 DAYS. No alcohol consumption for 24 hours after receiving sedation. If you went home on the same day as your procedure, a responsible adult should be with you for the first 24 hours after you arrive home. Keep all follow-up visits as told by your health care provider. This is important. Contact a health care provider if: You have a fever. You have redness, swelling, or yellow drainage around your insertion site. Get help right away if: You have unusual pain at the radial site. The catheter insertion area swells very fast. The insertion area is bleeding, and the bleeding does not stop when you hold steady pressure on the area. Your arm or hand becomes pale, cool, tingly, or numb. These symptoms may represent a serious problem that is an emergency. Do not wait to see if the symptoms will go away. Get medical help right away. Call your local emergency services (911 in the [place] Do not drive yourself to the hospital. Summary After the procedure, it is common to have bruising and tenderness at the site. Follow instructions from your health care provider about how to take care of your radial site wound. Check the wound every day for signs of infection.  This information is not intended to replace advice given to you by your health care provider. Make sure you discuss any questions you have with   your health care provider. Document Revised: 03/12/2017 Document Reviewed: 03/12/2017 Elsevier Patient Education  2020 [employer].  

## 2020-08-08 NOTE — Interval H&P Note (Signed)
History and Physical Interval Note:  08/08/2020 11:19 AM  Audrey Ruiz  has presented today for surgery, with the diagnosis of Dilated Cardiomyopathy (EF 30 to 35%) with Abnormal Nuclear Stress Test.    The various methods of treatment have been discussed with the patient and family. After consideration of risks, benefits and other options for treatment, the patient has consented to  Procedure(s): LEFT HEART CATH AND CORONARY ANGIOGRAPHY (N/A)  PERCUTANEOUS CORONARY INTERVENTION   as a surgical intervention.  The patient's history has been reviewed, patient examined, no change in status, stable for surgery.  I have reviewed the patient's chart and labs.  Questions were answered to the patient's satisfaction.    Cath Lab Visit (complete for each Cath Lab visit)  Clinical Evaluation Leading to the Procedure:   ACS: No.  Non-ACS:    Anginal Classification: CCS III  Anti-ischemic medical therapy: Minimal Therapy (1 class of medications)  Non-Invasive Test Results: Intermediate-risk stress test findings: cardiac mortality 1-3%/year; but high risk echocardiogram findings.  Prior CABG: No previous CABG    Audrey Ruiz

## 2020-08-08 NOTE — Research (Signed)
IDENTIFY Informed Consent                  Subject Name: Audrey Ruiz. Gar[place]   Subject met inclusion and exclusion criteria.  The informed consent form, study requirements and expectations were reviewed with the subject and questions and concerns were addressed prior to the signing of the consent form.  The subject verbalized understanding of the trial requirements.  The subject agreed to participate in the IDENTIFY trial and signed the informed consent at [place] AM on 08/08/20.  The informed consent was obtained prior to performance of any protocol-specific procedures for the subject.  A copy of the signed informed consent was given to the subject and a copy was placed in the subject's medical record.   Sallye Ober , Research Assistant

## 2020-08-09 ENCOUNTER — Encounter (HOSPITAL_COMMUNITY): Payer: Self-pay | Admitting: Cardiology

## 2020-08-12 DIAGNOSIS — S50312A Abrasion of left elbow, initial encounter: Secondary | ICD-10-CM | POA: Diagnosis not present

## 2020-08-12 DIAGNOSIS — S6992XA Unspecified injury of left wrist, hand and finger(s), initial encounter: Secondary | ICD-10-CM | POA: Diagnosis not present

## 2020-08-12 DIAGNOSIS — M25532 Pain in left wrist: Secondary | ICD-10-CM | POA: Diagnosis not present

## 2020-08-14 ENCOUNTER — Telehealth: Payer: Self-pay

## 2020-08-14 NOTE — Telephone Encounter (Signed)
Audrey Ruiz called to report the she had a fall on Saturday and was seen at the Urgent Care.  She sustained a fracture to her wrist and was placed in a soft splint.  They recommended referral to Ortho.  Dr. Tobie Poet advised that she follow-up with the hand specialist at [facility].  She is going to call for an appointment and she will call us back if a referral is needed.

## 2020-08-16 DIAGNOSIS — S52502A Unspecified fracture of the lower end of left radius, initial encounter for closed fracture: Secondary | ICD-10-CM

## 2020-08-16 DIAGNOSIS — M25532 Pain in left wrist: Secondary | ICD-10-CM | POA: Diagnosis not present

## 2020-08-16 DIAGNOSIS — S52552A Other extraarticular fracture of lower end of left radius, initial encounter for closed fracture: Secondary | ICD-10-CM | POA: Diagnosis not present

## 2020-08-16 HISTORY — DX: Unspecified fracture of the lower end of left radius, initial encounter for closed fracture: S52.502A

## 2020-08-17 ENCOUNTER — Encounter: Payer: Self-pay | Admitting: Cardiology

## 2020-08-17 ENCOUNTER — Other Ambulatory Visit: Payer: Self-pay

## 2020-08-17 ENCOUNTER — Ambulatory Visit (INDEPENDENT_AMBULATORY_CARE_PROVIDER_SITE_OTHER): Payer: PPO | Admitting: Cardiology

## 2020-08-17 VITALS — BP 118/76 | HR 64 | Ht 67.0 in | Wt 131.4 lb

## 2020-08-17 DIAGNOSIS — I420 Dilated cardiomyopathy: Secondary | ICD-10-CM

## 2020-08-17 DIAGNOSIS — E1169 Type 2 diabetes mellitus with other specified complication: Secondary | ICD-10-CM | POA: Diagnosis not present

## 2020-08-17 DIAGNOSIS — E119 Type 2 diabetes mellitus without complications: Secondary | ICD-10-CM | POA: Diagnosis not present

## 2020-08-17 DIAGNOSIS — I428 Other cardiomyopathies: Secondary | ICD-10-CM

## 2020-08-17 DIAGNOSIS — I447 Left bundle-branch block, unspecified: Secondary | ICD-10-CM | POA: Insufficient documentation

## 2020-08-17 DIAGNOSIS — E785 Hyperlipidemia, unspecified: Secondary | ICD-10-CM | POA: Diagnosis not present

## 2020-08-17 DIAGNOSIS — R0989 Other specified symptoms and signs involving the circulatory and respiratory systems: Secondary | ICD-10-CM | POA: Diagnosis not present

## 2020-08-17 HISTORY — DX: Other cardiomyopathies: I42.8

## 2020-08-17 HISTORY — DX: Left bundle-branch block, unspecified: I44.7

## 2020-08-17 MED ORDER — SPIRONOLACTONE 25 MG PO TABS: 12.5000 mg | ORAL_TABLET | Freq: Every day | ORAL | 3 refills | Status: DC

## 2020-08-17 NOTE — Progress Notes (Signed)
Cardiology Office Note:    Date:  08/17/2020   ID:  Audrey Ruiz, DOB 1950-02-26, MRN [id number]  PCP:  Audrey Brome, MD  Cardiologist:  None  Electrophysiologist:  None   Referring MD: Audrey Brome, MD   No chief complaint on file.   History of Present Illness:    Audrey Ruiz is a 70 y.o. female with a hx of hyperlipidemia, diabetes type 2, depressed ejection fraction 3540% on June 2022, nonischemic cardiomyopathy based on left heart catheterization done in June 2022, breast cancer status postchemotherapy with Adriamycin 10 years ago is here today for follow-up visit.  At her initial visit I recommended the patient get a nuclear stress test and echocardiogram with this both showed depressed ejection fraction.  I sent her for a heart catheterization which was normal without any evidence of coronary artery disease. She is here for follow-up visit post cath.   Past Medical History:  Diagnosis Date   Anxiety    Depression    Diabetes mellitus without complication ([place])    Gastroesophageal reflux disease 10/19/2019   GERD (gastroesophageal reflux disease)    Hyperlipidemia associated with type 2 diabetes mellitus ([place]) 10/19/2019   Mitral valve prolapse    Osteopenia 10/19/2019   Sinusitis 08/16/2019    Past Surgical History:  Procedure Laterality Date   BREAST LUMPECTOMY Left 2010   LEFT HEART CATH AND CORONARY ANGIOGRAPHY N/A 08/08/2020   Procedure: LEFT HEART CATH AND CORONARY ANGIOGRAPHY;  Surgeon: Leonie Man, MD;  Location: [facility];  Service: Cardiovascular;  Laterality: N/A;    Current Medications: Current Meds  Medication Sig   aspirin EC 81 MG tablet Take 1 tablet (81 mg total) by mouth daily. Swallow whole.   FLUoxetine (PROZAC) 10 MG tablet Take 1 tablet (10 mg total) by mouth daily.   FLUoxetine (PROZAC) 20 MG capsule Take 1 capsule by mouth once daily   fluticasone (FLONASE) 50 MCG/ACT nasal spray Use 2 spray(s) in  each nostril once daily   Glucosamine-Chondroitin-MSM 1500-1200-500 MG PACK Take 1 tablet by mouth daily.   metoprolol succinate (TOPROL XL) 25 MG 24 hr tablet Take 0.5 tablets (12.5 mg total) by mouth daily.   Multiple Vitamin (MULTIVITAMIN) capsule Take 1 capsule by mouth daily.   Omega-3 Fatty Acids (FISH OIL) 1000 MG CAPS Take 1,000 mg by mouth 2 (two) times daily.   omeprazole (PRILOSEC) 20 MG capsule Take 1 capsule by mouth once daily   rosuvastatin (CRESTOR) 40 MG tablet Take 1 tablet (40 mg total) by mouth daily.   sacubitril-valsartan (ENTRESTO) 24-26 MG Take 1 tablet by mouth 2 (two) times daily.   spironolactone (ALDACTONE) 25 MG tablet Take 0.5 tablets (12.5 mg total) by mouth daily.   Vitamin D, Cholecalciferol, 25 MCG (1000 UT) CAPS Take 1,000 Units by mouth daily.     Allergies:   Patient has no known allergies.   Social History   Socioeconomic History   Marital status: Married    Spouse name: Not on file   Number of children: 2   Years of education: Not on file   Highest education level: Master's degree (e.g., MA, MS, MEng, MEd, MSW, MBA)  Occupational History   Occupation: Retired  Tobacco Use   Smoking status: Never   Smokeless tobacco: Never  Vaping Use   Vaping Use: Never used  Substance and Sexual Activity   Alcohol use: Yes    Alcohol/week: 2.0 standard drinks    Types: 2 Glasses  of wine per week    Comment: Everyday   Drug use: Never   Sexual activity: Not Currently  Other Topics Concern   Not on file  Social History Narrative   Not on file   Social Determinants of Health   Financial Resource Strain: Not on file  Food Insecurity: Not on file  Transportation Needs: Not on file  Physical Activity: Not on file  Stress: Not on file  Social Connections: Not on file     Family History: The patient's family history includes Coronary artery disease in her father; Diabetes in her maternal grandfather and sister; Osteoporosis in her mother.  ROS:    Review of Systems  Constitution: Negative for decreased appetite, fever and weight gain.  HENT: Negative for congestion, ear discharge, hoarse voice and sore throat.   Eyes: Negative for discharge, redness, vision loss in right eye and visual halos.  Cardiovascular: Negative for chest pain, dyspnea on exertion, leg swelling, orthopnea and palpitations.  Respiratory: Negative for cough, hemoptysis, shortness of breath and snoring.   Endocrine: Negative for heat intolerance and polyphagia.  Hematologic/Lymphatic: Negative for bleeding problem. Does not bruise/bleed easily.  Skin: Negative for flushing, nail changes, rash and suspicious lesions.  Musculoskeletal: Negative for arthritis, joint pain, muscle cramps, myalgias, neck pain and stiffness.  Gastrointestinal: Negative for abdominal pain, bowel incontinence, diarrhea and excessive appetite.  Genitourinary: Negative for decreased libido, genital sores and incomplete emptying.  Neurological: Negative for brief paralysis, focal weakness, headaches and loss of balance.  Psychiatric/Behavioral: Negative for altered mental status, depression and suicidal ideas.  Allergic/Immunologic: Negative for HIV exposure and persistent infections.    EKGs/Labs/Other Studies Reviewed:    The following studies were reviewed today:   EKG: None today [facility] 08/08/2020 Angiographically minimal CAD, very tortuous. Proximal LAD appears to have a short subepicardial segment without obvious bridging. Somewhat diminutive but patent. 2 major septal perforators. LV end diastolic pressure is low. There is no aortic valve stenosis.   SUMMARY Angiographically minimal CAD with short subepicardial portion of the proximal LAD that is relatively diminutive, but widely patent.  Very tortuous vessels. Normal to low LVEDP. Nonischemic Cardiomyopathy: question if this is related to Left Bundle Branch Block with False Positive Stress Test related to Left Bundle Branch Block  Wall Motion.     Transthoracic echocardiogram July 24, 2020 IMPRESSIONS   1. Left ventricular ejection fraction, by estimation, is 35 to 40%. The  left ventricle has moderate to severely decreased function. The left  ventricle demonstrates global hypokinesis. There is mild concentric left  ventricular hypertrophy. Left  ventricular diastolic parameters are consistent with Grade II diastolic  dysfunction (pseudonormalization). Elevated left atrial pressure.   2. Right ventricular systolic function is mildly reduced. The right  ventricular size is normal. There is normal pulmonary artery systolic  pressure.   3. The mitral valve is degenerative. Mild mitral valve regurgitation. No  evidence of mitral stenosis.   4. The aortic valve is tricuspid. Aortic valve regurgitation is not  visualized. No aortic stenosis is present.   5. The inferior vena cava is normal in size with greater than 50%  respiratory variability, suggesting right atrial pressure of 3 mmHg.   FINDINGS   Left Ventricle: Left ventricular ejection fraction, by estimation, is 35  to 40%. The left ventricle has moderate to severely decreased function.  The left ventricle demonstrates global hypokinesis. The left ventricular  internal cavity size was normal in  size. There is mild concentric left ventricular hypertrophy.  Abnormal  (paradoxical) septal motion, consistent with left bundle branch block.  Left ventricular diastolic parameters are consistent with Grade II  diastolic dysfunction (pseudonormalization).  Elevated left atrial pressure.   Right Ventricle: The right ventricular size is normal. No increase in  right ventricular wall thickness. Right ventricular systolic function is  mildly reduced. There is normal pulmonary artery systolic pressure. The  tricuspid regurgitant velocity is 2.39  m/s, and with an assumed right atrial pressure of 3 mmHg, the estimated  right ventricular systolic pressure is 23.5 mmHg.    Left Atrium: Left atrial size was normal in size.   Right Atrium: Right atrial size was normal in size.   Pericardium: There is no evidence of pericardial effusion.   Mitral Valve: The mitral valve is degenerative in appearance. There is  mild thickening of the mitral valve leaflet(s). Mild mitral annular  calcification. Mild mitral valve regurgitation. No evidence of mitral  valve stenosis.   Tricuspid Valve: The tricuspid valve is normal in structure. Tricuspid  valve regurgitation is mild . No evidence of tricuspid stenosis.   Aortic Valve: The aortic valve is tricuspid. Aortic valve regurgitation is  not visualized. No aortic stenosis is present.   Pulmonic Valve: The pulmonic valve was normal in structure. Pulmonic valve  regurgitation is not visualized. No evidence of pulmonic stenosis.   Aorta: The aortic root, ascending aorta, aortic arch and descending aorta  are all structurally normal, with no evidence of dilitation or  obstruction.   Venous: A systolic blunting flow pattern is recorded from the right upper  pulmonary vein. The inferior vena cava is normal in size with greater than  50% respiratory variability, suggesting right atrial pressure of 3 mmHg.   IAS/Shunts: No atrial level shunt detected by color flow Doppler.      Nuclear stress test Jul 14, 2019 The left ventricular ejection fraction is moderately decreased (30-44%). Nuclear stress EF: 42%. There was no ST segment deviation noted during stress. Findings consistent with prior myocardial infarction. This is an intermediate risk study.     Recent Labs: 06/01/2020: ALT 20; TSH 2.150 08/01/2020: BUN 13; Creatinine, Ser 0.73; Hemoglobin 13.5; Magnesium 2.0; Platelets 280; Potassium 4.9; Sodium 140  Recent Lipid Panel    Component Value Date/Time   CHOL 199 10/19/2019 0908   TRIG 89 10/19/2019 0908   HDL 76 10/19/2019 0908   CHOLHDL 2.6 10/19/2019 0908   LDLCALC 107 (H) 10/19/2019 0908     Physical Exam:    VS:  BP 118/76   Pulse 64   Ht 5\' 7"  (1.702 m)   Wt 131 lb 6.4 oz (59.6 kg)   SpO2 98%   BMI 20.58 kg/m     Wt Readings from Last 3 Encounters:  08/17/20 131 lb 6.4 oz (59.6 kg)  08/08/20 127 lb (57.6 kg)  08/01/20 133 lb (60.3 kg)     GEN: Well nourished, well developed in no acute distress HEENT: Normal NECK: No JVD; No carotid bruits LYMPHATICS: No lymphadenopathy CARDIAC: S1S2 noted,RRR, no murmurs, rubs, gallops RESPIRATORY:  Clear to auscultation without rales, wheezing or rhonchi  ABDOMEN: Soft, non-tender, non-distended, +bowel sounds, no guarding. EXTREMITIES: No edema, No cyanosis, no clubbing MUSCULOSKELETAL:  No deformity  SKIN: Warm and dry NEUROLOGIC:  Alert and oriented x 3, non-focal PSYCHIATRIC:  Normal affect, good insight  ASSESSMENT:    1. Nonischemic cardiomyopathy ([place])   2. Depressed left ventricular ejection fraction   3. Dilated cardiomyopathy ([place])   4. Diabetes mellitus without  complication ([place])   5. Hyperlipidemia associated with type 2 diabetes mellitus ([place])   6. LBBB (left bundle branch block)    PLAN:    We talked in great detail about her diagnosis of normal ischemic cardiomyopathy and her treatment plans.  For now we will going to optimize her medical regimen with guideline directed medical therapy.  She is currently on Entresto as well as a beta-blocker.  I like to start low-dose Aldactone 12.5 mg.  We will hold off to make sure her blood pressure can tolerate it and then add [place].  I also spoke to the patient with her underlying left bundle branch block if her echo does not show any improvement on this therapy we will have to look into cardiac resynchronization therapy (CRT- device therapy).  I spoke to the patient in great length about medical therapy and CRT as well.  She had a lot of questions which I was able to answer for the patient and she was very grateful at the end of our visit.  Repeat echocardiogram  will be scheduled in September and will have a follow-up at that time.  At that time if there is no improvement in her EF I will refer the patient to EP for further discussion.  We will stop the aspirin 81 mg daily. BMP and mag will be done today.  The patient is in agreement with the above plan. The patient left the office in stable condition.  The patient will follow up in 3 months or sooner if needed.   Medication Adjustments/Labs and Tests Ordered: Current medicines are reviewed at length with the patient today.  Concerns regarding medicines are outlined above.  Orders Placed This Encounter  Procedures   Basic metabolic panel   Magnesium   ECHOCARDIOGRAM COMPLETE   Meds ordered this encounter  Medications   spironolactone (ALDACTONE) 25 MG tablet    Sig: Take 0.5 tablets (12.5 mg total) by mouth daily.    Dispense:  45 tablet    Refill:  3    Patient Instructions  Medication Instructions:  Your physician has recommended you make the following change in your medication:  START: Aldactone 12.5 mg once daily  *If you need a refill on your cardiac medications before your next appointment, please call your pharmacy*   Lab Work: Your physician recommends that you return for lab work in:  TODAY: [facility], [place]  If you have labs (blood work) drawn today and your tests are completely normal, you will receive your results only by: [place] (if you have [place]) OR A paper copy in the mail If you have any lab test that is abnormal or we need to change your treatment, we will call you to review the results.   Testing/Procedures: Your physician has requested that you have an echocardiogram. Echocardiography is a painless test that uses sound waves to create images of your heart. It provides your doctor with information about the size and shape of your heart and how well your heart's chambers and valves are working. This procedure takes approximately one hour. There are no  restrictions for this procedure.    Follow-Up: At [facility], you and your health needs are our priority.  As part of our continuing mission to provide you with exceptional heart care, we have created designated Provider Care Teams.  These Care Teams include your primary Cardiologist (physician) and Advanced Practice Providers (APPs -  Physician Assistants and Nurse Practitioners) who all work together to provide you  with the care you need, when you need it.  We recommend signing up for the patient portal called "MyChart".  Sign up information is provided on this After Visit Summary.  MyChart is used to connect with patients for Virtual Visits (Telemedicine).  Patients are able to view lab/test results, encounter notes, upcoming appointments, etc.  Non-urgent messages can be sent to your provider as well.   To learn more about what you can do with MyChart, go to [email or web address].    Your next appointment:   After Echo  The format for your next appointment:   In Person    Other Instructions Echocardiogram An echocardiogram is a test that uses sound waves (ultrasound) to produce images of the heart. Images from an echocardiogram can provide important information about: Heart size and shape. The size and thickness and movement of your heart's walls. Heart muscle function and strength. Heart valve function or if you have stenosis. Stenosis is when the heart valves are too narrow. If blood is flowing backward through the heart valves (regurgitation). A tumor or infectious growth around the heart valves. Areas of heart muscle that are not working well because of poor blood flow or injury from a heart attack. Aneurysm detection. An aneurysm is a weak or damaged part of an artery wall. The wall bulges out from the normal force of blood pumping through the body. Tell a health care provider about: Any allergies you have. All medicines you are taking, including vitamins, herbs, eye  drops, creams, and over-the-counter medicines. Any blood disorders you have. Any surgeries you have had. Any medical conditions you have. Whether you are pregnant or may be pregnant. What are the risks? Generally, this is a safe test. However, problems may occur, including an allergic reaction to dye (contrast) that may be used during the test. What happens before the test? No specific preparation is needed. You may eat and drink normally. What happens during the test?  You will take off your clothes from the waist up and put on a hospital gown. Electrodes or electrocardiogram (ECG)patches may be placed on your chest. The electrodes or patches are then connected to a device that monitors your heart rate and rhythm. You will lie down on a table for an ultrasound exam. A gel will be applied to your chest to help sound waves pass through your skin. A handheld device, called a transducer, will be pressed against your chest and moved over your heart. The transducer produces sound waves that travel to your heart and bounce back (or "echo" back) to the transducer. These sound waves will be captured in real-time and changed into images of your heart that can be viewed on a video monitor. The images will be recorded on a computer and reviewed by your health care provider. You may be asked to change positions or hold your breath for a short time. This makes it easier to get different views or better views of your heart. In some cases, you may receive contrast through an IV in one of your veins. This can improve the quality of the pictures from your heart. The procedure may vary among health care providers and hospitals. What can I expect after the test? You may return to your normal, everyday life, including diet, activities, andmedicines, unless your health care provider tells you not to do that. Follow these instructions at home: It is up to you to get the results of your test. Ask your health care  provider, or the department  that is doing the test, when your results will be ready. Keep all follow-up visits. This is important. Summary An echocardiogram is a test that uses sound waves (ultrasound) to produce images of the heart. Images from an echocardiogram can provide important information about the size and shape of your heart, heart muscle function, heart valve function, and other possible heart problems. You do not need to do anything to prepare before this test. You may eat and drink normally. After the echocardiogram is completed, you may return to your normal, everyday life, unless your health care provider tells you not to do that. This information is not intended to replace advice given to you by your health care provider. Make sure you discuss any questions you have with your healthcare provider. Document Revised: 09/28/2019 Document Reviewed: 09/28/2019 Elsevier Patient Education  2022 [place].    Adopting a Healthy Lifestyle.  Know what a healthy weight is for you (roughly BMI <25) and aim to maintain this   Aim for 7+ servings of fruits and vegetables daily   65-80+ fluid ounces of water or unsweet tea for healthy kidneys   Limit to max 1 drink of alcohol per day; avoid smoking/tobacco   Limit animal fats in diet for cholesterol and heart health - choose grass fed whenever available   Avoid highly processed foods, and foods high in saturated/trans fats   Aim for low stress - take time to unwind and care for your mental health   Aim for 150 min of moderate intensity exercise weekly for heart health, and weights twice weekly for bone health   Aim for 7-9 hours of sleep daily   When it comes to diets, agreement about the perfect plan isnt easy to find, even among the experts. Experts at the [employer] developed an idea known as the Healthy Eating Plate. Just imagine a plate divided into logical, healthy portions.   The emphasis is on diet  quality:   Load up on vegetables and fruits - one-half of your plate: Aim for color and variety, and remember that potatoes dont count.   Go for whole grains - one-quarter of your plate: Whole wheat, barley, wheat berries, quinoa, oats, brown rice, and foods made with them. If you want pasta, go with whole wheat pasta.   Protein power - one-quarter of your plate: Fish, chicken, beans, and nuts are all healthy, versatile protein sources. Limit red meat.   The diet, however, does go beyond the plate, offering a few other suggestions.   Use healthy plant oils, such as olive, canola, soy, corn, sunflower and peanut. Check the labels, and avoid partially hydrogenated oil, which have unhealthy trans fats.   If youre thirsty, drink water. Coffee and tea are good in moderation, but skip sugary drinks and limit milk and dairy products to one or two daily servings.   The type of carbohydrate in the diet is more important than the amount. Some sources of carbohydrates, such as vegetables, fruits, whole grains, and beans-are healthier than others.   Finally, stay active  Signed, Berniece Salines, DO  08/17/2020 9:36 AM    [place]

## 2020-08-17 NOTE — Patient Instructions (Addendum)
Medication Instructions:  Your physician has recommended you make the following change in your medication:  STOP: Aspirin  START: Aldactone 12.5 mg once daily  *If you need a refill on your cardiac medications before your next appointment, please call your pharmacy*   Lab Work: Your physician recommends that you return for lab work in:  TODAY: BMET, [place]  If you have labs (blood work) drawn today and your tests are completely normal, you will receive your results only by: [place] (if you have [place]) OR A paper copy in the mail If you have any lab test that is abnormal or we need to change your treatment, we will call you to review the results.   Testing/Procedures: Your physician has requested that you have an echocardiogram. Echocardiography is a painless test that uses sound waves to create images of your heart. It provides your doctor with information about the size and shape of your heart and how well your heart's chambers and valves are working. This procedure takes approximately one hour. There are no restrictions for this procedure.    Follow-Up: At [facility], you and your health needs are our priority.  As part of our continuing mission to provide you with exceptional heart care, we have created designated Provider Care Teams.  These Care Teams include your primary Cardiologist (physician) and Advanced Practice Providers (APPs -  Physician Assistants and Nurse Practitioners) who all work together to provide you with the care you need, when you need it.  We recommend signing up for the patient portal called "MyChart".  Sign up information is provided on this After Visit Summary.  MyChart is used to connect with patients for Virtual Visits (Telemedicine).  Patients are able to view lab/test results, encounter notes, upcoming appointments, etc.  Non-urgent messages can be sent to your provider as well.   To learn more about what you can do with MyChart, go to  [email or web address].    Your next appointment:   After Echo  The format for your next appointment:   In Person    Other Instructions Echocardiogram An echocardiogram is a test that uses sound waves (ultrasound) to produce images of the heart. Images from an echocardiogram can provide important information about: Heart size and shape. The size and thickness and movement of your heart's walls. Heart muscle function and strength. Heart valve function or if you have stenosis. Stenosis is when the heart valves are too narrow. If blood is flowing backward through the heart valves (regurgitation). A tumor or infectious growth around the heart valves. Areas of heart muscle that are not working well because of poor blood flow or injury from a heart attack. Aneurysm detection. An aneurysm is a weak or damaged part of an artery wall. The wall bulges out from the normal force of blood pumping through the body. Tell a health care provider about: Any allergies you have. All medicines you are taking, including vitamins, herbs, eye drops, creams, and over-the-counter medicines. Any blood disorders you have. Any surgeries you have had. Any medical conditions you have. Whether you are pregnant or may be pregnant. What are the risks? Generally, this is a safe test. However, problems may occur, including an allergic reaction to dye (contrast) that may be used during the test. What happens before the test? No specific preparation is needed. You may eat and drink normally. What happens during the test?  You will take off your clothes from the waist up and put on a hospital gown. Electrodes  or electrocardiogram (ECG)patches may be placed on your chest. The electrodes or patches are then connected to a device that monitors your heart rate and rhythm. You will lie down on a table for an ultrasound exam. A gel will be applied to your chest to help sound waves pass through your skin. A handheld  device, called a transducer, will be pressed against your chest and moved over your heart. The transducer produces sound waves that travel to your heart and bounce back (or "echo" back) to the transducer. These sound waves will be captured in real-time and changed into images of your heart that can be viewed on a video monitor. The images will be recorded on a computer and reviewed by your health care provider. You may be asked to change positions or hold your breath for a short time. This makes it easier to get different views or better views of your heart. In some cases, you may receive contrast through an IV in one of your veins. This can improve the quality of the pictures from your heart. The procedure may vary among health care providers and hospitals. What can I expect after the test? You may return to your normal, everyday life, including diet, activities, andmedicines, unless your health care provider tells you not to do that. Follow these instructions at home: It is up to you to get the results of your test. Ask your health care provider, or the department that is doing the test, when your results will be ready. Keep all follow-up visits. This is important. Summary An echocardiogram is a test that uses sound waves (ultrasound) to produce images of the heart. Images from an echocardiogram can provide important information about the size and shape of your heart, heart muscle function, heart valve function, and other possible heart problems. You do not need to do anything to prepare before this test. You may eat and drink normally. After the echocardiogram is completed, you may return to your normal, everyday life, unless your health care provider tells you not to do that. This information is not intended to replace advice given to you by your health care provider. Make sure you discuss any questions you have with your healthcare provider. Document Revised: 09/28/2019 Document Reviewed:  09/28/2019 Elsevier Patient Education  2022 [employer].

## 2020-08-18 LAB — BASIC METABOLIC PANEL
BUN/Creatinine Ratio: 18 (ref 12–28)
BUN: 12 mg/dL (ref 8–27)
CO2: 22 mmol/L (ref 20–29)
Calcium: 10.5 mg/dL — ABNORMAL HIGH (ref 8.7–10.3)
Chloride: 104 mmol/L (ref 96–106)
Creatinine, Ser: 0.68 mg/dL (ref 0.57–1.00)
Glucose: 88 mg/dL (ref 65–99)
Potassium: 4.9 mmol/L (ref 3.5–5.2)
Sodium: 141 mmol/L (ref 134–144)
eGFR: 93 mL/min/{1.73_m2} (ref >59)

## 2020-08-18 LAB — MAGNESIUM: Magnesium: 2 mg/dL (ref 1.6–2.3)

## 2020-08-28 ENCOUNTER — Telehealth: Payer: Self-pay

## 2020-08-28 ENCOUNTER — Telehealth: Payer: Self-pay | Admitting: Cardiology

## 2020-08-28 NOTE — Telephone Encounter (Signed)
Pt c/o medication issue: 1. Name of [facility]  2. How are you currently taking this medication (dosage and times per day)? BID  3. Are you having a reaction (difficulty breathing--STAT)?  No  4. What is your medication issue? Patient needs assistants. With medication.     Patient has 3 days

## 2020-08-28 NOTE — Telephone Encounter (Signed)
Called patient back. Patient made aware new forms will be sitting up front for her along with offers for her to get the medication at a reduced price. No further questions or concerns expressed at this time.

## 2020-08-28 NOTE — Telephone Encounter (Signed)
Spoke with Shae from  [employer] she states the paperwork that was sent in was not the correct form. She assisted me in printing the new form. Will call patient to let her know we need to update the paperwork.

## 2020-08-28 NOTE — Telephone Encounter (Signed)
Pt is returning call.  

## 2020-08-28 NOTE — Telephone Encounter (Signed)
Called patient with update information, left message, see chart.

## 2020-08-28 NOTE — Telephone Encounter (Signed)
Called patient back, she was wondering does she need to refill her Entresto prescription at the pharmacy or if she will get the new one in the mail. I will call the patient assistance for Entresto and see where they are at in the process of getting the patient assistance. Will call patient back with information.

## 2020-08-28 NOTE — Telephone Encounter (Signed)
Called patient to inform her abut to incorrect paperwork being sent in. Left a message for her to return the call or just come by the office and get the correct form.

## 2020-08-30 ENCOUNTER — Telehealth: Payer: Self-pay | Admitting: Oncology

## 2020-08-30 ENCOUNTER — Telehealth: Payer: Self-pay | Admitting: Cardiology

## 2020-08-30 ENCOUNTER — Telehealth: Payer: Self-pay

## 2020-08-30 NOTE — Telephone Encounter (Signed)
Shirlean Mylar called needing a pre auth for patient medication sacubitril-valsartan (ENTRESTO) 24-26 MG

## 2020-08-30 NOTE — Telephone Encounter (Signed)
Patiemt Canceled 7/18 due to family Emergency

## 2020-08-30 NOTE — Telephone Encounter (Signed)
Pre Auth submitted

## 2020-09-04 ENCOUNTER — Inpatient Hospital Stay: Payer: PPO | Admitting: Oncology

## 2020-09-06 ENCOUNTER — Ambulatory Visit (INDEPENDENT_AMBULATORY_CARE_PROVIDER_SITE_OTHER): Payer: PPO

## 2020-09-06 ENCOUNTER — Other Ambulatory Visit: Payer: Self-pay

## 2020-09-06 ENCOUNTER — Telehealth: Payer: Self-pay

## 2020-09-06 DIAGNOSIS — R0989 Other specified symptoms and signs involving the circulatory and respiratory systems: Secondary | ICD-10-CM

## 2020-09-06 LAB — ECHOCARDIOGRAM COMPLETE
Area-P 1/2: 4.17 cm2
Calc EF: 31.3 %
S' Lateral: 4.2 cm
Single Plane A2C EF: 30.7 %
Single Plane A4C EF: 35.2 %

## 2020-09-06 NOTE — Telephone Encounter (Signed)
-----   Message from Richardo Priest, MD sent at 09/06/2020  2:21 PM EDT ----- Ejection fraction looks similar to previously noted Dr. Tawni Carnes record.  She can discuss with her further at office follow-up

## 2020-09-06 NOTE — Progress Notes (Signed)
Complete echocardiogram performed.  Jimmy Carri Spillers RDCS, RVT  

## 2020-09-06 NOTE — Telephone Encounter (Signed)
Left message on patients voicemail to please return our call.   

## 2020-09-07 ENCOUNTER — Other Ambulatory Visit: Payer: Self-pay

## 2020-09-07 ENCOUNTER — Telehealth: Payer: Self-pay

## 2020-09-07 MED ORDER — ENTRESTO 24-26 MG PO TABS: 1.0000 | ORAL_TABLET | Freq: Two times a day (BID) | ORAL | 3 refills | Status: DC

## 2020-09-07 MED ORDER — ENTRESTO 24-26 MG PO TABS: 1.0000 | ORAL_TABLET | Freq: Two times a day (BID) | ORAL | 0 refills | Status: DC

## 2020-09-07 NOTE — Telephone Encounter (Signed)
Spoke with patient regarding results and recommendation.  Patient verbalizes understanding and is agreeable to plan of care. Advised patient to call back with any issues or concerns.  

## 2020-09-07 NOTE — Telephone Encounter (Signed)
-----   Message from Richardo Priest, MD sent at 09/06/2020  2:21 PM EDT ----- Ejection fraction looks similar to previously noted Dr. Tawni Carnes record.  She can discuss with her further at office follow-up

## 2020-09-07 NOTE — Telephone Encounter (Signed)
Error

## 2020-09-08 DIAGNOSIS — S52552D Other extraarticular fracture of lower end of left radius, subsequent encounter for closed fracture with routine healing: Secondary | ICD-10-CM | POA: Diagnosis not present

## 2020-09-11 ENCOUNTER — Telehealth: Payer: Self-pay

## 2020-09-11 NOTE — Telephone Encounter (Signed)
PREVIOUS INFO WAS ENTERED IN ERROR, WRONG PATIENT

## 2020-09-11 NOTE — Telephone Encounter (Deleted)
Initial prior authorization for Audrey Ruiz was denied.    Appeal [name] (Key: [id number]) - [id number] [place] '140MG'$ /ML auto-injectors     Status: Sent to Plan  Created: July 19th, 2022  Sent: July 21st, 2022t

## 2020-09-11 NOTE — Telephone Encounter (Signed)
2nd reqeust received from [place] for prior authorization    Prior authorization submitted for Entresto 24-26  Dalli Furse (Key: [facility])

## 2020-09-20 ENCOUNTER — Ambulatory Visit (INDEPENDENT_AMBULATORY_CARE_PROVIDER_SITE_OTHER): Payer: PPO | Admitting: Cardiology

## 2020-09-20 ENCOUNTER — Encounter: Payer: Self-pay | Admitting: Cardiology

## 2020-09-20 ENCOUNTER — Other Ambulatory Visit: Payer: Self-pay

## 2020-09-20 VITALS — BP 112/60 | HR 66 | Ht 67.0 in | Wt 128.8 lb

## 2020-09-20 DIAGNOSIS — I447 Left bundle-branch block, unspecified: Secondary | ICD-10-CM | POA: Diagnosis not present

## 2020-09-20 DIAGNOSIS — I428 Other cardiomyopathies: Secondary | ICD-10-CM

## 2020-09-20 DIAGNOSIS — E785 Hyperlipidemia, unspecified: Secondary | ICD-10-CM | POA: Diagnosis not present

## 2020-09-20 DIAGNOSIS — E119 Type 2 diabetes mellitus without complications: Secondary | ICD-10-CM

## 2020-09-20 DIAGNOSIS — E1169 Type 2 diabetes mellitus with other specified complication: Secondary | ICD-10-CM

## 2020-09-20 NOTE — Progress Notes (Signed)
Established Patient Office Visit  Subjective:  Patient ID: Audrey Ruiz, female    DOB: 1950/12/26  Age: 70 y.o. MRN: [id number]  CC: prediabetes, hyperlipidemia follow up.   HPI [name] [name] presents for follow-up and management of diabetes, hyperlipidemia, GERD, anxiety, and depression.   Prediabetes: last a1c 06/01/2020 was 5.7. pt feels shaky after eating a high carb meal. She is concerned she is having her sugars drop at times. She feels dizzy at times.   Nonischemic cardiomyopathy: EF dropped from 40-45 to 30-35. ON entresto, metoprolol. False positive stress test followed by a LHC which showed no blockages. Dxd with nonischemic cardiomyopathy. Systolic CHF.     Falls x 3. First fall was when she tried to catch her mother. Fractured left wrist and is healing well without surgery.  Pt fell in hole in yard. Missed a step and fell down stairs (3-4.) Was carrying stuff down the stairs. Pt has always been clutzy.   Past Medical History:  Diagnosis Date   Anxiety    Depression    Diabetes mellitus without complication ([place])    Gastroesophageal reflux disease 10/19/2019   GERD (gastroesophageal reflux disease)    Hyperlipidemia associated with type 2 diabetes mellitus ([place]) 10/19/2019   Mitral valve prolapse    Osteopenia 10/19/2019   Sinusitis 08/16/2019    Past Surgical History:  Procedure Laterality Date   BREAST LUMPECTOMY Left 2010   LEFT HEART CATH AND CORONARY ANGIOGRAPHY N/A 08/08/2020   Procedure: LEFT HEART CATH AND CORONARY ANGIOGRAPHY;  Surgeon: Leonie Man, MD;  Location: [facility];  Service: Cardiovascular;  Laterality: N/A;    Family History  Problem Relation Age of Onset   Osteoporosis Mother    Coronary artery disease Father    Diabetes Sister    Diabetes Maternal Grandfather     Social History   Socioeconomic History   Marital status: Married    Spouse name: Not on file   Number of children: 2   Years of  education: Not on file   Highest education level: Master's degree (e.g., MA, MS, MEng, MEd, MSW, MBA)  Occupational History   Occupation: Retired  Tobacco Use   Smoking status: Never   Smokeless tobacco: Never  Vaping Use   Vaping Use: Never used  Substance and Sexual Activity   Alcohol use: Yes    Alcohol/week: 2.0 standard drinks    Types: 2 Glasses of wine per week    Comment: Everyday   Drug use: Never   Sexual activity: Not Currently  Other Topics Concern   Not on file  Social History Narrative   Not on file   Social Determinants of Health   Financial Resource Strain: Not on file  Food Insecurity: Not on file  Transportation Needs: Not on file  Physical Activity: Not on file  Stress: Not on file  Social Connections: Not on file  Intimate Partner Violence: Not on file    Outpatient Medications Prior to Visit  Medication Sig Dispense Refill   FLUoxetine (PROZAC) 20 MG capsule Take 1 capsule by mouth once daily 90 capsule 3   fluticasone (FLONASE) 50 MCG/ACT nasal spray Use 2 spray(s) in each nostril once daily 48 g 3   Glucosamine-Chondroitin-MSM 1500-1200-500 MG PACK Take 1 tablet by mouth daily.     metoprolol succinate (TOPROL XL) 25 MG 24 hr tablet Take 0.5 tablets (12.5 mg total) by mouth daily. 45 tablet 3   Multiple Vitamin (MULTIVITAMIN) capsule Take  1 capsule by mouth daily.     Omega-3 Fatty Acids (FISH OIL) 1000 MG CAPS Take 1,000 mg by mouth 2 (two) times daily.     omeprazole (PRILOSEC) 20 MG capsule Take 1 capsule by mouth once daily 90 capsule 3   rosuvastatin (CRESTOR) 40 MG tablet Take 1 tablet (40 mg total) by mouth daily. 90 tablet 2   sacubitril-valsartan (ENTRESTO) 24-26 MG Take 1 tablet by mouth 2 (two) times daily. 30 tablet 0   spironolactone (ALDACTONE) 25 MG tablet Take 0.5 tablets (12.5 mg total) by mouth daily. 45 tablet 3   Vitamin D, Cholecalciferol, 25 MCG (1000 UT) CAPS Take 1,000 Units by mouth daily.     FLUoxetine (PROZAC) 10 MG  tablet Take 1 tablet (10 mg total) by mouth daily. (Patient not taking: Reported on 09/20/2020) 90 tablet 3   No facility-administered medications prior to visit.    No Known Allergies  ROS Review of Systems  Constitutional:  Positive for chills. Negative for fatigue.  HENT:  Negative for congestion and sore throat.   Eyes:  Negative for visual disturbance.  Respiratory:  Negative for cough and shortness of breath.   Cardiovascular:  Positive for palpitations. Negative for chest pain and leg swelling.  Gastrointestinal:  Negative for abdominal pain, constipation, diarrhea, nausea and vomiting.  Endocrine: Negative for cold intolerance, heat intolerance, polyphagia and polyuria.  Genitourinary:  Negative for difficulty urinating, frequency and urgency.  Neurological:  Positive for light-headedness. Negative for dizziness, weakness and numbness.  Psychiatric/Behavioral:  Negative for dysphoric mood. The patient is not nervous/anxious.      Objective:    Physical Exam Vitals reviewed.  Constitutional:      Appearance: Normal appearance. She is well-developed.  HENT:     Right Ear: Tympanic membrane and external ear normal.     Left Ear: Tympanic membrane and external ear normal.     Nose: Nose normal. No mucosal edema.     Mouth/Throat:     Pharynx: No oropharyngeal exudate.  Neck:     Vascular: No carotid bruit.  Cardiovascular:     Rate and Rhythm: Normal rate and regular rhythm.     Pulses: Normal pulses.     Heart sounds: Normal heart sounds. No murmur heard. Pulmonary:     Effort: Pulmonary effort is normal. No respiratory distress.     Breath sounds: Normal breath sounds. No wheezing or rales.  Abdominal:     General: Bowel sounds are normal.     Palpations: Abdomen is soft.     Tenderness: There is no abdominal tenderness.  Musculoskeletal:     Right lower leg: No edema.     Left lower leg: No edema.  Neurological:     Mental Status: She is alert and oriented to  person, place, and time.  Psychiatric:        Mood and Affect: Mood normal.        Behavior: Behavior normal.    BP 120/60   Pulse 64   Temp 98.2 F (36.8 C)   Resp 16   Ht 5' 7"  (1.702 m)   Wt 126 lb 9.6 oz (57.4 kg)   BMI 19.83 kg/m  Wt Readings from Last 3 Encounters:  09/21/20 126 lb 9.6 oz (57.4 kg)  09/20/20 128 lb 12.8 oz (58.4 kg)  08/17/20 131 lb 6.4 oz (59.6 kg)     Health Maintenance Due  Topic Date Due   FOOT EXAM  Never done   OPHTHALMOLOGY  EXAM  Never done   Hepatitis C Screening  Never done   Zoster Vaccines- Shingrix (1 of 2) Never done   PNA vac Low Risk Adult (2 of 2 - PPSV23) 12/22/2015   COVID-19 Vaccine (4 - Booster for Moderna series) 04/17/2020   INFLUENZA VACCINE  09/18/2020    There are no preventive care reminders to display for this patient.  Lab Results  Component Value Date   TSH 2.150 06/01/2020   Lab Results  Component Value Date   WBC 5.2 09/21/2020   HGB 12.1 09/21/2020   HCT 37.0 09/21/2020   MCV 95 09/21/2020   PLT 289 09/21/2020   Lab Results  Component Value Date   NA 140 09/21/2020   K 5.0 09/21/2020   CO2 23 09/21/2020   GLUCOSE 93 09/21/2020   BUN 15 09/21/2020   CREATININE 0.70 09/21/2020   BILITOT 0.6 09/21/2020   ALKPHOS 64 09/21/2020   AST 21 09/21/2020   ALT 16 09/21/2020   PROT 6.1 09/21/2020   ALBUMIN 4.4 09/21/2020   CALCIUM 10.6 (H) 09/21/2020   EGFR 92 09/21/2020   Lab Results  Component Value Date   CHOL 175 09/21/2020   Lab Results  Component Value Date   HDL 61 09/21/2020   Lab Results  Component Value Date   LDLCALC 102 (H) 09/21/2020   Lab Results  Component Value Date   TRIG 60 09/21/2020   Lab Results  Component Value Date   CHOLHDL 2.9 09/21/2020   Lab Results  Component Value Date   HGBA1C 6.0 (H) 09/21/2020      Assessment & Plan:   1. Gastroesophageal reflux disease without esophagitis The current medical regimen is effective;  continue present plan and  medications.  2. Mixed hyperlipidemia Well controlled.  No changes to medicines.  Continue to work on eating a healthy diet and exercise.  Labs drawn today.  - AMB Referral to [facility] - CBC with Differential/Platelet - Comprehensive metabolic panel - Lipid panel  3. Nonischemic cardiomyopathy ([place]) Keep follow up with cardiology.  - AMB Referral to [place]  4. Prediabetes Recommend continue to work on eating healthy diet and exercise. - [facility]B [facility] to [facility] - Hemoglobin A1c  5. Chronic systolic congestive heart failure (HCC) The current medical regimen is effective;  continue present plan and medications.  6. Depression, major, recurrent, mild (HCC) The current medical regimen is effective;  continue present plan and medications.  7. LBBB (left bundle branch block)    Meds ordered this encounter  Medications   blood glucose meter kit and supplies KIT    Sig: Dispense based on patient and insurance preference. Use up to four times daily as directed.    Dispense:  1 each    Refill:  0    Order Specific Question:   Number of strips    Answer:   100    Order Specific Question:   Number of lancets    Answer:   100    Follow-up: Return in about 3 months (around 12/22/2020) for fasting.    [name], MD

## 2020-09-20 NOTE — Patient Instructions (Signed)
Medication Instructions:  Your physician recommends that you continue on your current medications as directed. Please refer to the Current Medication list given to you today.  *If you need a refill on your cardiac medications before your next appointment, please call your pharmacy*   Lab Work: Your physician recommends that you return for lab work in: Within one week of your cardiac MRI BMP If you have labs (blood work) drawn today and your tests are completely normal, you will receive your results only by: [place] (if you have MyChart) OR A paper copy in the mail If you have any lab test that is abnormal or we need to change your treatment, we will call you to review the results.   Testing/Procedures: Your physician has requested that you have a cardiac MRI. Cardiac MRI uses a computer to create images of your heart as its beating, producing both still and moving pictures of your heart and major blood vessels. For further information please visit [email or web address] Please follow the instruction sheet given to you today for more information.    Follow-Up: At [facility], you and your health needs are our priority.  As part of our continuing mission to provide you with exceptional heart care, we have created designated Provider Care Teams.  These Care Teams include your primary Cardiologist (physician) and Advanced Practice Providers (APPs -  Physician Assistants and Nurse Practitioners) who all work together to provide you with the care you need, when you need it.  We recommend signing up for the patient portal called "MyChart".  Sign up information is provided on this After Visit Summary.  MyChart is used to connect with patients for Virtual Visits (Telemedicine).  Patients are able to view lab/test results, encounter notes, upcoming appointments, etc.  Non-urgent messages can be sent to your provider as well.   To learn more about what you can do with MyChart, go to  [email or web address].    Your next appointment:   1 month(s)  The format for your next appointment:   In Person  Provider:   Jyl Heinz, MD   Other Instructions

## 2020-09-20 NOTE — Progress Notes (Signed)
Cardiology Office Note:    Date:  09/20/2020   ID:  [name] Symphanie Smallridge, DOB 26-Feb-1950, MRN [id number]  PCP:  [name], MD  Cardiologist:  Jenean Lindau, MD   Referring MD: [name], MD    ASSESSMENT:    1. Nonischemic cardiomyopathy ([place])   2. LBBB (left bundle branch block)   3. Diabetes mellitus without complication ([place])   4. Hyperlipidemia associated with type 2 diabetes mellitus ([place])    PLAN:    In order of problems listed above:  Nonischemic cardiomyopathy: I discussed my findings with the patient at length.  I had had her echo and performed it to be very suboptimal evaluation of ejection fraction because of intraventricular conduction delay.  My recommendation is that she get a cardiac MRI to assess ejection fraction appropriately.  Subsequently she may benefit from evaluation by our electrophysiology colleagues with more better information.  In the interim she will continue current medications.  She is on guideline directed medical therapy. There has been mention of using nitrates for her.  I am not sure whether I want to pursue that at this time because of borderline blood pressure.  I discussed this with her at length and she vocalized understanding. I wanted to get a Chem-7 because of the fact that she is on multiple medications.  She will see her primary care tomorrow and has an appointment for annual physical and promises to get all the blood work tomorrow. Mixed dyslipidemia: Diet was emphasized and lipids were reviewed.  Questions were answered to her satisfaction. Patient will be seen in follow-up appointment in 1 month or earlier if the patient has any concerns    Medication Adjustments/Labs and Tests Ordered: Current medicines are reviewed at length with the patient today.  Concerns regarding medicines are outlined above.  Orders Placed This Encounter  Procedures   MR CARDIAC MORPHOLOGY W WO CONTRAST    No orders of the defined types were  placed in this encounter.    No chief complaint on file.    History of Present Illness:    Shanessa Liebling is a 70 y.o. female.  Patient is previously unknown to me.  She has cardiomyopathy and was sent for coronary angiography which was unremarkable.  Her ejection fraction is moderately depressed.  She denies any chest pain orthopnea or PND.  Her overall complaints are only of weakness.  No chest pain.  No pedal edema.  At the time of my evaluation, the patient is alert awake oriented and in no distress.  Past Medical History:  Diagnosis Date   Anxiety    Depression    Diabetes mellitus without complication ([place])    Gastroesophageal reflux disease 10/19/2019   GERD (gastroesophageal reflux disease)    Hyperlipidemia associated with type 2 diabetes mellitus ([place]) 10/19/2019   Mitral valve prolapse    Osteopenia 10/19/2019   Sinusitis 08/16/2019    Past Surgical History:  Procedure Laterality Date   BREAST LUMPECTOMY Left 2010   LEFT HEART CATH AND CORONARY ANGIOGRAPHY N/A 08/08/2020   Procedure: LEFT HEART CATH AND CORONARY ANGIOGRAPHY;  Surgeon: Leonie Man, MD;  Location: [facility];  Service: Cardiovascular;  Laterality: N/A;    Current Medications: Current Meds  Medication Sig   FLUoxetine (PROZAC) 20 MG capsule Take 1 capsule by mouth once daily   fluticasone (FLONASE) 50 MCG/ACT nasal spray Use 2 spray(s) in each nostril once daily   Glucosamine-Chondroitin-MSM 1500-1200-500 MG PACK Take 1  tablet by mouth daily.   metoprolol succinate (TOPROL XL) 25 MG 24 hr tablet Take 0.5 tablets (12.5 mg total) by mouth daily.   Multiple Vitamin (MULTIVITAMIN) capsule Take 1 capsule by mouth daily.   Omega-3 Fatty Acids (FISH OIL) 1000 MG CAPS Take 1,000 mg by mouth 2 (two) times daily.   omeprazole (PRILOSEC) 20 MG capsule Take 1 capsule by mouth once daily   rosuvastatin (CRESTOR) 40 MG tablet Take 1 tablet (40 mg total) by mouth daily.   sacubitril-valsartan  (ENTRESTO) 24-26 MG Take 1 tablet by mouth 2 (two) times daily.   spironolactone (ALDACTONE) 25 MG tablet Take 0.5 tablets (12.5 mg total) by mouth daily.   Vitamin D, Cholecalciferol, 25 MCG (1000 UT) CAPS Take 1,000 Units by mouth daily.     Allergies:   Patient has no known allergies.   Social History   Socioeconomic History   Marital status: Married    Spouse name: Not on file   Number of children: 2   Years of education: Not on file   Highest education level: Master's degree (e.g., MA, MS, MEng, MEd, MSW, MBA)  Occupational History   Occupation: Retired  Tobacco Use   Smoking status: Never   Smokeless tobacco: Never  Vaping Use   Vaping Use: Never used  Substance and Sexual Activity   Alcohol use: Yes    Alcohol/week: 2.0 standard drinks    Types: 2 Glasses of wine per week    Comment: Everyday   Drug use: Never   Sexual activity: Not Currently  Other Topics Concern   Not on file  Social History Narrative   Not on file   Social Determinants of Health   Financial Resource Strain: Not on file  Food Insecurity: Not on file  Transportation Needs: Not on file  Physical Activity: Not on file  Stress: Not on file  Social Connections: Not on file     Family History: The patient's family history includes Coronary artery disease in her father; Diabetes in her maternal grandfather and sister; Osteoporosis in her mother.  ROS:   Please see the history of present illness.    All other systems reviewed and are negative.  EKGs/Labs/Other Studies Reviewed:    The following studies were reviewed today: LEFT HEART CATH AND CORONARY ANGIOGRAPHY    Conclusion    Angiographically minimal CAD, very tortuous. Proximal LAD appears to have a short subepicardial segment without obvious bridging. Somewhat diminutive but patent. 2 major septal perforators. LV end diastolic pressure is low. There is no aortic valve stenosis.   SUMMARY Angiographically minimal CAD with short  subepicardial portion of the proximal LAD that is relatively diminutive, but widely patent.  Very tortuous vessels. Normal to low LVEDP. Nonischemic Cardiomyopathy: question if this is related to Left Bundle Branch Block with False Positive Stress Test related to Left Bundle Branch Block Wall Motion.     RECOMMENDATIONS Continue guideline directed medical therapy - consider use of long active Nitrate based upon the response to IC NTG Consider referral for CRT/BiV pacing.    IMPRESSIONS     1. Dysynchronous LV most likely secondary to IVCD. In view of this, EF assessment is not optimal. May consider other modalities for better assessment.. Left ventricular ejection fraction, by estimation, is 30 to 35%. The left ventricle has moderately  decreased function. The left ventricle has no regional wall motion  abnormalities. Left ventricular diastolic parameters are consistent with  Grade I diastolic dysfunction (impaired relaxation).   2.  Right ventricular systolic function is normal. The right ventricular  size is normal. There is normal pulmonary artery systolic pressure.   3. The mitral valve is degenerative. No evidence of mitral valve  regurgitation. No evidence of mitral stenosis.   4. The aortic valve is normal in structure. Aortic valve regurgitation is  not visualized. No aortic stenosis is present.   5. The inferior vena cava is normal in size with greater than 50%  respiratory variability, suggesting right atrial pressure of 3 mmHg.     Recent Labs: 06/01/2020: ALT 20; TSH 2.150 08/01/2020: Hemoglobin 13.5; Platelets 280 08/17/2020: BUN 12; Creatinine, Ser 0.68; Magnesium 2.0; Potassium 4.9; Sodium 141  Recent Lipid Panel    Component Value Date/Time   CHOL 199 10/19/2019 0908   TRIG 89 10/19/2019 0908   HDL 76 10/19/2019 0908   CHOLHDL 2.6 10/19/2019 0908   LDLCALC 107 (H) 10/19/2019 0908    Physical Exam:    VS:  BP 112/60   Pulse 66   Ht '5\' 7"'$  (1.702 m)   Wt 128  lb 12.8 oz (58.4 kg)   SpO2 98%   BMI 20.17 kg/m     Wt Readings from Last 3 Encounters:  09/20/20 128 lb 12.8 oz (58.4 kg)  08/17/20 131 lb 6.4 oz (59.6 kg)  08/08/20 127 lb (57.6 kg)     GEN: Patient is in no acute distress HEENT: Normal NECK: No JVD; No carotid bruits LYMPHATICS: No lymphadenopathy CARDIAC: Hear sounds regular, 2/6 systolic murmur at the apex. RESPIRATORY:  Clear to auscultation without rales, wheezing or rhonchi  ABDOMEN: Soft, non-tender, non-distended MUSCULOSKELETAL:  No edema; No deformity  SKIN: Warm and dry NEUROLOGIC:  Alert and oriented x 3 PSYCHIATRIC:  Normal affect   Signed, Jenean Lindau, MD  09/20/2020 2:58 PM    [facility]

## 2020-09-21 ENCOUNTER — Encounter: Payer: Self-pay | Admitting: Family Medicine

## 2020-09-21 ENCOUNTER — Ambulatory Visit (INDEPENDENT_AMBULATORY_CARE_PROVIDER_SITE_OTHER): Payer: PPO | Admitting: Family Medicine

## 2020-09-21 VITALS — BP 120/60 | HR 64 | Temp 98.2°F | Resp 16 | Ht 67.0 in | Wt 126.6 lb

## 2020-09-21 DIAGNOSIS — I420 Dilated cardiomyopathy: Secondary | ICD-10-CM

## 2020-09-21 DIAGNOSIS — I428 Other cardiomyopathies: Secondary | ICD-10-CM | POA: Diagnosis not present

## 2020-09-21 DIAGNOSIS — I5022 Chronic systolic (congestive) heart failure: Secondary | ICD-10-CM

## 2020-09-21 DIAGNOSIS — E782 Mixed hyperlipidemia: Secondary | ICD-10-CM | POA: Diagnosis not present

## 2020-09-21 DIAGNOSIS — I447 Left bundle-branch block, unspecified: Secondary | ICD-10-CM | POA: Diagnosis not present

## 2020-09-21 DIAGNOSIS — E162 Hypoglycemia, unspecified: Secondary | ICD-10-CM | POA: Diagnosis not present

## 2020-09-21 DIAGNOSIS — F330 Major depressive disorder, recurrent, mild: Secondary | ICD-10-CM | POA: Diagnosis not present

## 2020-09-21 DIAGNOSIS — R7303 Prediabetes: Secondary | ICD-10-CM | POA: Diagnosis not present

## 2020-09-21 DIAGNOSIS — K219 Gastro-esophageal reflux disease without esophagitis: Secondary | ICD-10-CM | POA: Diagnosis not present

## 2020-09-21 MED ORDER — BLOOD GLUCOSE MONITOR KIT
PACK | 0 refills | Status: DC
Start: 2020-09-21 — End: 2020-11-07

## 2020-09-21 NOTE — Patient Instructions (Signed)
Diabetes Care, 44(Suppl 1), S34-S39. https://doi.org/https://doi.org/[email or web address]/[email or web address]-[email or web address]">  Prediabetes Prediabetes is when your blood sugar (blood glucose) level is higher than normal but not high enough for you to be diagnosed with type 2 diabetes. Having prediabetes puts you at risk for developing type 2 diabetes (type 2 diabetes mellitus). With certain lifestyle changes, you may be able to prevent or delay the onset of type 2 diabetes. This is important because type 2 diabetes can lead to serious complications, such as: Heart disease. Stroke. Blindness. Kidney disease. Depression. Poor circulation in the feet and legs. In severe cases, this could lead to surgical removal of a leg (amputation). What are the causes? The exact cause of prediabetes is not known. It may result from insulin resistance. Insulin resistance develops when cells in the body do not respond properly to insulin that the body makes. This can cause excess glucose to build up in the blood. High blood glucose (hyperglycemia) can develop. What increases the risk? The following factors may make you more likely to develop this condition: You have a family member with type 2 diabetes. You are older than 45 years. You had a temporary form of diabetes during a pregnancy (gestational diabetes). You had polycystic ovary syndrome (PCOS). You are overweight or obese. You are inactive (sedentary). You have a history of heart disease, including problems with cholesterol levels, high levels of blood fats, or high blood pressure. What are the signs or symptoms? You may have no symptoms. If you do have symptoms, they may include: Increased hunger. Increased thirst. Increased urination. Vision changes, such as blurry vision. Tiredness (fatigue). How is this diagnosed? This condition can be diagnosed with blood tests. Your blood glucose may be checked with one or more of the following tests: A fasting blood glucose (FBG) test. You will  not be allowed to eat (you will fast) for at least 8 hours before a blood sample is taken. An A1C blood test (hemoglobin A1C). This test provides information about blood glucose levels over the previous 2?3 months. An oral glucose tolerance test (OGTT). This test measures your blood glucose at two points in time: After fasting. This is your baseline level. Two hours after you drink a beverage that contains glucose. You may be diagnosed with prediabetes if: Your FBG is 100?125 mg/dL (5.6-6.9 mmol/L). Your A1C level is 5.7?6.4% (39-46 mmol/mol). Your OGTT result is 140?199 mg/dL (7.8-11 mmol/L). These blood tests may be repeated to confirm your diagnosis. How is this treated? Treatment may include dietary and lifestyle changes to help lower your blood glucose and prevent type 2 diabetes from developing. In some cases, medicinemay be prescribed to help lower the risk of type 2 diabetes. Follow these instructions at home: Nutrition  Follow a healthy meal plan. This includes eating lean proteins, whole grains, legumes, fresh fruits and vegetables, low-fat dairy products, and healthy fats. Follow instructions from your health care provider about eating or drinking restrictions. Meet with a dietitian to create a healthy eating plan that is right for you.  Lifestyle Do moderate-intensity exercise for at least 30 minutes a day on 5 or more days each week, or as told by your health care provider. A mix of activities may be best, such as: Brisk walking, swimming, biking, and weight lifting. Lose weight as told by your health care provider. Losing 5-7% of your body weight can reverse insulin resistance. Do not drink alcohol if: Your health care provider tells you not to drink. You are pregnant, may be pregnant, or are planning   to become pregnant. If you drink alcohol: Limit how much you use to: 0-1 drink a day for women. 0-2 drinks a day for men. Be aware of how much alcohol is in your drink. In  the [place] one drink equals one 12 oz bottle of beer (355 mL), one 5 oz glass of wine (148 mL), or one 1 oz glass of hard liquor (44 mL). General instructions Take over-the-counter and prescription medicines only as told by your health care provider. You may be prescribed medicines that help lower the risk of type 2 diabetes. Do not use any products that contain nicotine or tobacco, such as cigarettes, e-cigarettes, and chewing tobacco. If you need help quitting, ask your health care provider. Keep all follow-up visits. This is important. Where to find more information American Diabetes Association: www.diabetes.org Academy of Nutrition and Dietetics: www.eatright.org American Heart Association: www.heart.org Contact a health care provider if: You have any of these symptoms: Increased hunger. Increased urination. Increased thirst. Fatigue. Vision changes, such as blurry vision. Get help right away if you: Have shortness of breath. Feel confused. Vomit or feel like you may vomit. Summary Prediabetes is when your blood sugar (blood glucose)level is higher than normal but not high enough for you to be diagnosed with type 2 diabetes. Having prediabetes puts you at risk for developing type 2 diabetes (type 2 diabetes mellitus). Make lifestyle changes such as eating a healthy diet and exercising regularly to help prevent diabetes. Lose weight as told by your health care provider. This information is not intended to replace advice given to you by your health care provider. Make sure you discuss any questions you have with your healthcare provider. Document Revised: 05/06/2019 Document Reviewed: 05/06/2019 Elsevier Patient Education  2022 Elsevier Inc.  Prediabetes Eating Plan Prediabetes is a condition that causes blood sugar (glucose) levels to be higher than normal. This increases the risk for developing type 2 diabetes (type 2 diabetes mellitus). Working with a health care provider or  nutrition specialist (dietitian) to make diet and lifestyle changes can help prevent the onset of diabetes. These changes may help you: Control your blood glucose levels. Improve your cholesterol levels. Manage your blood pressure. What are tips for following this plan? Reading food labels Read food labels to check the amount of fat, salt (sodium), and sugar in prepackaged foods. Avoid foods that have: Saturated fats. Trans fats. Added sugars. Avoid foods that have more than 300 milligrams (mg) of sodium per serving. Limit your sodium intake to less than 2,300 mg each day. Shopping Avoid buying pre-made and processed foods. Avoid buying drinks with added sugar. Cooking Cook with olive oil. Do not use butter, lard, or ghee. Bake, broil, grill, steam, or boil foods. Avoid frying. Meal planning  Work with your dietitian to create an eating plan that is right for you. This may include tracking how many calories you take in each day. Use a food diary, notebook, or mobile application to track what you eat at each meal. Consider following a Mediterranean diet. This includes: Eating several servings of fresh fruits and vegetables each day. Eating fish at least twice a week. Eating one serving each day of whole grains, beans, nuts, and seeds. Using olive oil instead of other fats. Limiting alcohol. Limiting red meat. Using nonfat or low-fat dairy products. Consider following a plant-based diet. This includes dietary choices that focus on eating mostly vegetables and fruit, grains, beans, nuts, and seeds. If you have high blood pressure, you may need to   limit your sodium intake or follow a diet such as the DASH (Dietary Approaches to Stop Hypertension) eating plan. The DASH diet aims to lower high blood pressure.  Lifestyle Set weight loss goals with help from your health care team. It is recommended that most people with prediabetes lose 7% of their body weight. Exercise for at least 30  minutes 5 or more days a week. Attend a support group or seek support from a mental health counselor. Take over-the-counter and prescription medicines only as told by your health care provider. What foods are recommended? Fruits Berries. Bananas. Apples. Oranges. Grapes. Papaya. Mango. Pomegranate. Kiwi.Grapefruit. Cherries. Vegetables Lettuce. Spinach. Peas. Beets. Cauliflower. Cabbage. Broccoli. Carrots.Tomatoes. Squash. Eggplant. Herbs. Peppers. Onions. Cucumbers. Brussels sprouts. Grains Whole grains, such as whole-wheat or whole-grain breads, crackers, cereals, and pasta. Unsweetened oatmeal. Bulgur. Barley. Quinoa. Brown rice. Corn orwhole-wheat flour tortillas or taco shells. Meats and other proteins Seafood. Poultry without skin. Lean cuts of pork and beef. Tofu. Eggs. Nuts.Beans. Dairy Low-fat or fat-free dairy products, such as yogurt, cottage cheese, and cheese. Beverages Water. Tea. Coffee. Sugar-free or diet soda. Seltzer water. Low-fat or nonfatmilk. Milk alternatives, such as soy or almond milk. Fats and oils Olive oil. Canola oil. Sunflower oil. Grapeseed oil. Avocado. Walnuts. Sweets and desserts Sugar-free or low-fat pudding. Sugar-free or low-fat ice cream and other frozentreats. Seasonings and condiments Herbs. Sodium-free spices. Mustard. Relish. Low-salt, low-sugar ketchup.Low-salt, low-sugar barbecue sauce. Low-fat or fat-free mayonnaise. The items listed above may not be a complete list of recommended foods and beverages. Contact a dietitian for more information. What foods are not recommended? Fruits Fruits canned with syrup. Vegetables Canned vegetables. Frozen vegetables with butter or cream sauce. Grains Refined white flour and flour products, such as bread, pasta, snack foods, andcereals. Meats and other proteins Fatty cuts of meat. Poultry with skin. Breaded or fried meat. Processed meats. Dairy Full-fat yogurt, cheese, or milk. Beverages Sweetened  drinks, such as iced tea and soda. Fats and oils Butter. Lard. Ghee. Sweets and desserts Baked goods, such as cake, cupcakes, pastries, cookies, and cheesecake. Seasonings and condiments Spice mixes with added salt. Ketchup. Barbecue sauce. Mayonnaise. The items listed above may not be a complete list of foods and beverages that are not recommended. Contact a dietitian for more information. Where to find more information American Diabetes Association: www.diabetes.org Summary You may need to make diet and lifestyle changes to help prevent the onset of diabetes. These changes can help you control blood sugar, improve cholesterol levels, and manage blood pressure. Set weight loss goals with help from your health care team. It is recommended that most people with prediabetes lose 7% of their body weight. Consider following a Mediterranean diet. This includes eating plenty of fresh fruits and vegetables, whole grains, beans, nuts, seeds, fish, and low-fat dairy, and using olive oil instead of other fats. This information is not intended to replace advice given to you by your health care provider. Make sure you discuss any questions you have with your healthcare provider. Document Revised: 05/06/2019 Document Reviewed: 05/06/2019 Elsevier Patient Education  2022 Elsevier Inc.   

## 2020-09-22 ENCOUNTER — Telehealth: Payer: Self-pay | Admitting: Family Medicine

## 2020-09-22 LAB — CBC WITH DIFFERENTIAL/PLATELET
Basophils Absolute: 0.1 x10E3/uL (ref 0.0–0.2)
Basos: 1 %
EOS (ABSOLUTE): 0.2 x10E3/uL (ref 0.0–0.4)
Eos: 5 %
Hematocrit: 37 % (ref 34.0–46.6)
Hemoglobin: 12.1 g/dL (ref 11.1–15.9)
Immature Grans (Abs): 0 x10E3/uL (ref 0.0–0.1)
Immature Granulocytes: 0 %
Lymphocytes Absolute: 1.9 x10E3/uL (ref 0.7–3.1)
Lymphs: 36 %
MCH: 31 pg (ref 26.6–33.0)
MCHC: 32.7 g/dL (ref 31.5–35.7)
MCV: 95 fL (ref 79–97)
Monocytes Absolute: 0.4 x10E3/uL (ref 0.1–0.9)
Monocytes: 8 %
Neutrophils Absolute: 2.7 x10E3/uL (ref 1.4–7.0)
Neutrophils: 50 %
Platelets: 289 x10E3/uL (ref 150–450)
RBC: 3.9 x10E6/uL (ref 3.77–5.28)
RDW: 12.6 % (ref 11.7–15.4)
WBC: 5.2 x10E3/uL (ref 3.4–10.8)

## 2020-09-22 LAB — LIPID PANEL
Chol/HDL Ratio: 2.9 ratio (ref 0.0–4.4)
Cholesterol, Total: 175 mg/dL (ref 100–199)
HDL: 61 mg/dL
LDL Chol Calc (NIH): 102 mg/dL — ABNORMAL HIGH (ref 0–99)
Triglycerides: 60 mg/dL (ref 0–149)
VLDL Cholesterol Cal: 12 mg/dL (ref 5–40)

## 2020-09-22 LAB — COMPREHENSIVE METABOLIC PANEL WITH GFR
ALT: 16 IU/L (ref 0–32)
AST: 21 IU/L (ref 0–40)
Albumin/Globulin Ratio: 2.6 — ABNORMAL HIGH (ref 1.2–2.2)
Albumin: 4.4 g/dL (ref 3.7–4.7)
Alkaline Phosphatase: 64 IU/L (ref 44–121)
BUN/Creatinine Ratio: 21 (ref 12–28)
BUN: 15 mg/dL (ref 8–27)
Bilirubin Total: 0.6 mg/dL (ref 0.0–1.2)
CO2: 23 mmol/L (ref 20–29)
Calcium: 10.6 mg/dL — ABNORMAL HIGH (ref 8.7–10.3)
Chloride: 104 mmol/L (ref 96–106)
Creatinine, Ser: 0.7 mg/dL (ref 0.57–1.00)
Globulin, Total: 1.7 g/dL (ref 1.5–4.5)
Glucose: 93 mg/dL (ref 65–99)
Potassium: 5 mmol/L (ref 3.5–5.2)
Sodium: 140 mmol/L (ref 134–144)
Total Protein: 6.1 g/dL (ref 6.0–8.5)
eGFR: 92 mL/min/1.73

## 2020-09-22 LAB — HEMOGLOBIN A1C
Est. average glucose Bld gHb Est-mCnc: 126 mg/dL
Hgb A1c MFr Bld: 6 % — ABNORMAL HIGH (ref 4.8–5.6)

## 2020-09-22 LAB — CARDIOVASCULAR RISK ASSESSMENT

## 2020-09-22 NOTE — Chronic Care Management (AMB) (Signed)
  Chronic Care Management   Note  09/22/2020 Name: Audrey Ruiz MRN: [id number] DOB: 11-30-1950  Audrey Ruiz is a 70 y.o. year old female who is a primary care patient of Cox, Kirsten, MD. I reached out to [name] by phone today in response to a referral sent by Ms. Audrey Leys Hanner Gloeckner's PCP, Cox, Kirsten, MD.   [name] was given information about Chronic Care Management services today including:  CCM service includes personalized support from designated clinical staff supervised by her physician, including individualized plan of care and coordination with other care providers 24/7 contact phone numbers for assistance for urgent and routine care needs. Service will only be billed when office clinical staff spend 20 minutes or more in a month to coordinate care. Only one practitioner may furnish and bill the service in a calendar month. The patient may stop CCM services at any time (effective at the end of the month) by phone call to the office staff.   Patient agreed to services and verbal consent obtained.   Follow up plan:   Tatjana Secretary/administrator

## 2020-09-26 ENCOUNTER — Other Ambulatory Visit: Payer: Self-pay

## 2020-09-26 MED ORDER — EZETIMIBE 10 MG PO TABS: 10.0000 mg | ORAL_TABLET | Freq: Every day | ORAL | 1 refills | Status: DC

## 2020-10-06 DIAGNOSIS — S52552D Other extraarticular fracture of lower end of left radius, subsequent encounter for closed fracture with routine healing: Secondary | ICD-10-CM | POA: Diagnosis not present

## 2020-10-12 ENCOUNTER — Ambulatory Visit (INDEPENDENT_AMBULATORY_CARE_PROVIDER_SITE_OTHER): Payer: PPO

## 2020-10-12 ENCOUNTER — Telehealth: Payer: Self-pay

## 2020-10-12 ENCOUNTER — Other Ambulatory Visit: Payer: Self-pay

## 2020-10-12 DIAGNOSIS — F330 Major depressive disorder, recurrent, mild: Secondary | ICD-10-CM | POA: Diagnosis not present

## 2020-10-12 DIAGNOSIS — R7303 Prediabetes: Secondary | ICD-10-CM

## 2020-10-12 DIAGNOSIS — I5022 Chronic systolic (congestive) heart failure: Secondary | ICD-10-CM

## 2020-10-12 DIAGNOSIS — E782 Mixed hyperlipidemia: Secondary | ICD-10-CM | POA: Diagnosis not present

## 2020-10-12 DIAGNOSIS — M8580 Other specified disorders of bone density and structure, unspecified site: Secondary | ICD-10-CM

## 2020-10-12 DIAGNOSIS — K219 Gastro-esophageal reflux disease without esophagitis: Secondary | ICD-10-CM

## 2020-10-12 NOTE — Chronic Care Management (AMB) (Signed)
Chronic Care Management Pharmacy Assistant   Name: Audrey Ruiz  MRN: [id number] DOB: March 02, 1950  Reason for Encounter: Chart Review for CPP visit on 10/12/2020.   Conditions to be addressed/monitored: CAD, HLD, DMII, Anxiety, Depression, and GERD  Recent office visits:  09/21/2020- [name], MD (PCP)- Patient presented for follow up visit on Gastroesophageal reflux disease without esophagitis, Mixed hyperlipidemia, Nonischemic cardiomyopathy ([place]), Prediabetes, Chronic systolic congestive heart failure ([place]), Depression, major, recurrent, mild ([place]), LBBB (left bundle branch block). Fluoxetine changed from 10 mg to 20 mg- 1 capsule daily. Ambulatory Referral to [facility] Coordination placed, CMP, A1C, Lipid panel, Cardiovascular Risk and CBC ordered. Return in about 3 months (around 12/22/2020) for fasting.  06/01/2020- Jerrell Belfast, NP (PCP)- Patient presented for a follow up visit on Mitral valve prolapse, Type 2 Diabetes mellitus, Hyperlipidemia, GERD. Fluoxetine changed from 20 mg to 10 mg- 1 tablet daily.  A1C, CBC, CMP, TSH ordered. Ambulatory referral to Cardiology placed.   Recent consult visits:  10/06/2020- Gertie Fey, PA ([facility])- Patient presented for follow up visit for Left wrist pain/ Closed fracture of distal end of left radius.  Left Wrist Xray Ordered. Follow up in 4-6 weeks. (See Media for notes).  8/03/2022Maude Leriche, MD (Cardiology)- Patient presented for follow up visit on Nonischemic cardiomyopathy [facility]), LBBB (left bundle branch block), Diabetes mellitus without complication ([place]), Hyperlipidemia associated with type 2 diabetes mellitus ([place]). MR Cardiac Morphology w wo contrast ordered. No medication changes. Follow up in 1 month.   09/06/2020- Echocardiogram complete performed.  08/17/2020- Berniece Salines, DO (Cardiology)- Patient presented for follow up visit on Nonischemic cardiomyopathy [facility]), Depressed left ventricular ejection  fraction, Dilated cardiomyopathy ([place]),  Diabetes mellitus without complication ([place]), Hyperlipidemia associated with type 2 diabetes mellitus ([place]), LBBB (left bundle branch block). Aspirin 81 mg discontinued. Spironolactone 12.5 mg - 1 tablet daily added. Follow up in 3 months or sooner if needed.   08/16/2020- Gertie Fey, PA ([facility])- Notes not available.  08/12/2020- Darien Ramus, MD- Notes not available.  08/08/2020- [name], MD (Cardiology)- LEFT HEART CATH AND CORONARY ANGIOGRAPHY.  Post- op diagnosis: Angiographically minimal CAD with short subendocardial portion of the proximal LAD that is relatively diminutive, but widely patent. Very tortuous vessels. Normal to low LVEDP. Nonischemic Cardiomyopathy-question if this is related to Left Bundle Bran, ch Block with False Positive Stress Test related to Left Bundle Branch Block Wall Motion.  08/01/2020- Godfrey Pick Tobb, DO (Cardiology)- Patient presented for follow up visit on Depressed left ventricular ejection fraction, Dilated cardiomyopathy ([place]), Hyperlipidemia associated with type 2 diabetes mellitus ([place]), Hypertension, unspecified type, Dyspnea on exertion. Entresto 24/26 mg - 1 tablet 2 times daily added. BMP, CBC, Magnesium ordered. Follow up in 2 weeks.   07/13/2020- Shirlee More, MD (Cardiology)- Notes not available.  06/29/2020- Berniece Salines, DO (Cardiology)- Patient presented for Initial visit on Mitral valve prolapse,  Other chest pain, Hyperlipidemia associated with type 2 diabetes mellitus ([place]), SOB (shortness of breath), Vitamin D deficiency, Fatigue, unspecified type. No medication changes, Vitamin D, Echocardiogram, EKG, Myocardial Perfusion Ordered. Follow up in 3 months.     Hospital visits:  Medication Reconciliation was completed by comparing discharge summary, patient's EMR and Pharmacy list, and upon discussion with patient.  Admitted to the hospital on 08/08/2020 due to Dilated Cardiomyopathy. Discharge  date was 08/08/2020. Discharged from [place]?Medications Started at [facility] Discharge:?? -started: None  Medication Changes at Hospital Discharge: -Changed: None  Medications Discontinued at Hospital Discharge: -Stopped: None  Medications that remain the same after Hospital Discharge:??  -All other medications will remain the same.    Medications: Outpatient Encounter Medications as of 10/12/2020  Medication Sig   blood glucose meter kit and supplies KIT Dispense based on patient and insurance preference. Use up to four times daily as directed.   ezetimibe (ZETIA) 10 MG tablet Take 1 tablet (10 mg total) by mouth daily.   FLUoxetine (PROZAC) 20 MG capsule Take 1 capsule by mouth once daily   fluticasone (FLONASE) 50 MCG/ACT nasal spray Use 2 spray(s) in each nostril once daily   Glucosamine-Chondroitin-MSM 1500-1200-500 MG PACK Take 1 tablet by mouth daily.   metoprolol succinate (TOPROL XL) 25 MG 24 hr tablet Take 0.5 tablets (12.5 mg total) by mouth daily.   Multiple Vitamin (MULTIVITAMIN) capsule Take 1 capsule by mouth daily.   Omega-3 Fatty Acids (FISH OIL) 1000 MG CAPS Take 1,000 mg by mouth 2 (two) times daily.   omeprazole (PRILOSEC) 20 MG capsule Take 1 capsule by mouth once daily   rosuvastatin (CRESTOR) 40 MG tablet Take 1 tablet (40 mg total) by mouth daily.   sacubitril-valsartan (ENTRESTO) 24-26 MG Take 1 tablet by mouth 2 (two) times daily.   spironolactone (ALDACTONE) 25 MG tablet Take 0.5 tablets (12.5 mg total) by mouth daily.   Vitamin D, Cholecalciferol, 25 MCG (1000 UT) CAPS Take 1,000 Units by mouth daily.   No facility-administered encounter medications on file as of 10/12/2020.    Care Gaps: FOOT EXAM- Overdue - never done  OPHTHALMOLOGY EXAM (Yearly) -Never done  Hepatitis C Screening (Once)- Never done Zoster Vaccines- Shingrix (1 of 2)- Never done PNA vac Low Risk Adult (2 of 2 - PPSV23)- Last completed: Dec 22, 2014 COVID-19 Vaccine (4 - Booster for [employer] series)- Last completed: Jan 17, 2020 INFLUENZA VACCINE (Every 8 Months, August to March)- Last completed: Oct 20, 2019 Annual Wellness Exam scheduled for 10/26/2020.  Star Rating Drugs: Rosuvastatin 40 mg- Last filled 08/25/2020 for 90 DS at [facility] 24/26 mg- Last filled 10/10/2020 for 30 DS at [place] [place] Pharmacist Assistant [phone]

## 2020-10-12 NOTE — Progress Notes (Signed)
Chronic Care Management Pharmacy Note  10/18/2020 Name:  Audrey Ruiz MRN:  [id number] DOB:  17-Mar-1950  Summary: Patient's last DEXA scan was 02/2018 indicating osteopenia. With Frax score she is on the border of being eligible for alendronate. Ollen Barges evaluate need for treatment with next Dexa scan.  Patient denies cost concerns with medications at this time. Encouraged patient to reach out to pharmacy team if affordability changes.  Discussed healthy diet and limiting carbohydrates and sugar intake. Patient has checked blood sugar intermittently at home with fasting of 93 and directly after breakfast in the 200s. Discussed goals of blood sugar and benefit of diet/exercise.    Subjective: Audrey Ruiz is an 70 y.o. year old female who is a primary patient of Cox, Kirsten, MD.  The CCM team was consulted for assistance with disease management and care coordination needs.    Engaged with patient face to face for initial visit in response to provider referral for pharmacy case management and/or care coordination services.   Consent to Services:  The patient was given the following information about Chronic Care Management services today, agreed to services, and gave verbal consent: 1. CCM service includes personalized support from designated clinical staff supervised by the primary care provider, including individualized plan of care and coordination with other care providers 2. 24/7 contact phone numbers for assistance for urgent and routine care needs. 3. Service will only be billed when office clinical staff spend 20 minutes or more in a month to coordinate care. 4. Only one practitioner may furnish and bill the service in a calendar month. 5.The patient may stop CCM services at any time (effective at the end of the month) by phone call to the office staff. 6. The patient will be responsible for cost sharing (co-pay) of up to 20% of the service fee (after annual  deductible is met). Patient agreed to services and consent obtained.  Patient Care Team: [name], MD as PCP - General (Family Medicine) Burnice Logan, [facility] as Pharmacist (Pharmacist)  Recent office visits:  09/21/2020- [name], MD (PCP)- Patient presented for follow up visit on Gastroesophageal reflux disease without esophagitis, Mixed hyperlipidemia, Nonischemic cardiomyopathy ([place]), Prediabetes, Chronic systolic congestive heart failure ([place]), Depression, major, recurrent, mild ([place]), LBBB (left bundle branch block). Fluoxetine changed from 10 mg to 20 mg- 1 capsule daily. Ambulatory Referral to [facility] Coordination placed, CMP, A1C, Lipid panel, Cardiovascular Risk and CBC ordered. Return in about 3 months (around 12/22/2020) for fasting.   06/01/2020- Jerrell Belfast, NP (PCP)- Patient presented for a follow up visit on Mitral valve prolapse, Type 2 Diabetes mellitus, Hyperlipidemia, GERD. Fluoxetine changed from 20 mg to 10 mg- 1 tablet daily.  A1C, CBC, CMP, TSH ordered. Ambulatory referral to Cardiology placed.    Recent consult visits:  10/06/2020- Gertie Fey, PA ([facility])- Patient presented for follow up visit for Left wrist pain/ Closed fracture of distal end of left radius.  Left Wrist Xray Ordered. Follow up in 4-6 weeks. (See Media for notes).   8/03/2022Maude Leriche, MD (Cardiology)- Patient presented for follow up visit on Nonischemic cardiomyopathy [facility]), LBBB (left bundle branch block), Diabetes mellitus without complication ([place]), Hyperlipidemia associated with type 2 diabetes mellitus ([place]). MR Cardiac Morphology w wo contrast ordered. No medication changes. Follow up in 1 month.    09/06/2020- Echocardiogram complete performed.   6/30/2022Godfrey Pick Tobb, DO (Cardiology)- Patient presented for follow up visit on Nonischemic cardiomyopathy ([place]), Depressed left ventricular ejection fraction,  Dilated cardiomyopathy ([place]),             Diabetes mellitus without  complication ([place]), Hyperlipidemia associated with type 2 diabetes mellitus ([place]), LBBB (left bundle branch block). Aspirin 81 mg discontinued. Spironolactone 12.5 mg - 1 tablet daily added. Follow up in 3 months or sooner if needed.    08/16/2020- Gertie Fey, PA ([facility])- Notes not available.   08/12/2020- Darien Ramus, MD- Notes not available.   08/08/2020- [name], MD (Cardiology)- LEFT HEART CATH AND CORONARY ANGIOGRAPHY.  Post- op diagnosis: Angiographically minimal CAD with short subendocardial portion of the proximal LAD that is relatively diminutive, but widely patent. Very tortuous vessels. Normal to low LVEDP. Nonischemic Cardiomyopathy-question if this is related to Left Bundle Bran, ch Block with False Positive Stress Test related to Left Bundle Branch Block Wall Motion.   08/01/2020- Godfrey Pick Tobb, DO (Cardiology)- Patient presented for follow up visit on Depressed left ventricular ejection fraction, Dilated cardiomyopathy ([place]), Hyperlipidemia associated with type 2 diabetes mellitus ([place]), Hypertension, unspecified type, Dyspnea on exertion. Entresto 24/26 mg - 1 tablet 2 times daily added. BMP, CBC, Magnesium ordered. Follow up in 2 weeks.    07/13/2020- Shirlee More, MD (Cardiology)- Notes not available.   06/29/2020- Berniece Salines, DO (Cardiology)- Patient presented for Initial visit on Mitral valve prolapse,   Other chest pain, Hyperlipidemia associated with type 2 diabetes mellitus ([place]), SOB (shortness of breath), Vitamin D deficiency, Fatigue, unspecified type. No medication changes, Vitamin D, Echocardiogram, EKG, Myocardial Perfusion Ordered. Follow up in 3 months.        Hospital visits:  Medication Reconciliation was completed by comparing discharge summary, patient's EMR and Pharmacy list, and upon discussion with patient.   Admitted to the hospital on 08/08/2020 due to Dilated Cardiomyopathy. Discharge date was 08/08/2020. Discharged from [place]?Medications Started at [facility] Discharge:?? -started: None   Medication Changes at Hospital Discharge: -Changed: None   Medications Discontinued at Hospital Discharge: -Stopped: None   Medications that remain the same after Hospital Discharge:??  -All other medications will remain the same.     Objective:  Lab Results  Component Value Date   CREATININE 0.70 09/21/2020   BUN 15 09/21/2020   GFRNONAA 90 10/19/2019   GFRAA 104 10/19/2019   NA 140 09/21/2020   K 5.0 09/21/2020   CALCIUM 10.6 (H) 09/21/2020   CO2 23 09/21/2020   GLUCOSE 93 09/21/2020    Lab Results  Component Value Date/Time   HGBA1C 6.0 (H) 09/21/2020 08:47 AM   HGBA1C 5.7 (H) 06/01/2020 10:21 AM    Last diabetic Eye exam: No results found for: HMDIABEYEEXA  Last diabetic Foot exam: No results found for: HMDIABFOOTEX   Lab Results  Component Value Date   CHOL 175 09/21/2020   HDL 61 09/21/2020   LDLCALC 102 (H) 09/21/2020   TRIG 60 09/21/2020   CHOLHDL 2.9 09/21/2020    Hepatic Function Latest Ref Rng & Units 09/21/2020 06/01/2020 10/19/2019  Total Protein 6.0 - 8.5 g/dL 6.1 6.7 6.7  Albumin 3.7 - 4.7 g/dL 4.4 4.3 4.6  AST 0 - 40 IU/L 21 39 26  ALT 0 - 32 IU/L 16 20 15   Alk Phosphatase 44 - 121 IU/L 64 54 61  Total Bilirubin 0.0 - 1.2 mg/dL 0.6 0.9 0.9    Lab Results  Component Value Date/Time   TSH 2.150 06/01/2020 10:21 AM    CBC Latest Ref Rng & Units 09/21/2020 08/01/2020 06/01/2020  WBC 3.4 - 10.8 x10E3/uL 5.2 5.6 5.6  Hemoglobin 11.1 - 15.9 g/dL 12.1 13.5 12.4  Hematocrit 34.0 - 46.6 % 37.0 41.7 37.9  Platelets 150 - 450 x10E3/uL 289 280 284    Lab Results  Component Value Date/Time   VD25OH 74.6 06/29/2020 11:17 AM   VD25OH 71.1 10/19/2019 09:14 AM    Clinical ASCVD: No  The 10-year ASCVD risk score Mikey Bussing DC Jr., et al., 2013) is: 21.6%   Values used to calculate the score:     Age: 65 years     Sex: Female     Is Non-Hispanic African American: No      Diabetic: Yes     Tobacco smoker: No     Systolic Blood Pressure: 696 mmHg     Is BP treated: Yes     HDL Cholesterol: 61 mg/dL     Total Cholesterol: 175 mg/dL    Depression screen [facility] 2/9 09/21/2020 06/01/2020 10/19/2019  Decreased Interest 0 1 0  Down, Depressed, Hopeless 0 1 0  PHQ - 2 Score 0 2 0  Altered sleeping - 0 0  Tired, decreased energy - 1 1  Change in appetite - 0 0  Feeling bad or failure about yourself  - 0 0  Trouble concentrating - 0 0  Moving slowly or fidgety/restless - 0 0  Suicidal thoughts - 0 0  PHQ-9 Score - 3 1  Difficult doing work/chores - Not difficult at all Not difficult at all     Other: (CHADS2VASc if Afib, MMRC or CAT for COPD, ACT, DEXA)  Social History   Tobacco Use  Smoking Status Never  Smokeless Tobacco Never   BP Readings from Last 3 Encounters:  09/21/20 120/60  09/20/20 112/60  08/17/20 118/76   Pulse Readings from Last 3 Encounters:  09/21/20 64  09/20/20 66  08/17/20 64   Wt Readings from Last 3 Encounters:  09/21/20 126 lb 9.6 oz (57.4 kg)  09/20/20 128 lb 12.8 oz (58.4 kg)  08/17/20 131 lb 6.4 oz (59.6 kg)   BMI Readings from Last 3 Encounters:  09/21/20 19.83 kg/m  09/20/20 20.17 kg/m  08/17/20 20.58 kg/m    Assessment/Interventions: Review of patient past medical history, allergies, medications, health status, including review of consultants reports, laboratory and other test data, was performed as part of comprehensive evaluation and provision of chronic care management services.   SDOH:  (Social Determinants of Health) assessments and interventions performed: Yes  SDOH Screenings   Alcohol Screen: Not on file  Depression (PHQ2-9): Low Risk    PHQ-2 Score: 0  Financial Resource Strain: Not on file  Food Insecurity: No Food Insecurity   Worried About Charity fundraiser in the Last Year: Never true   Ran Out of Food in the Last Year: Never true  Housing: [place] Risk Score: 0  Physical  Activity: Not on file  Social Connections: Not on file  Stress: Not on file  Tobacco Use: Low Risk    Smoking Tobacco Use: Never   Smokeless Tobacco Use: Never  Transportation Needs: Not on file    [place]  No Known Allergies  Medications Reviewed Today     Reviewed by Burnice Logan, [facility] (Pharmacist) on 10/13/20 at [place] List Status: <None>   Medication Order Taking? Sig Documenting Provider Last Dose Status Informant  blood glucose meter kit and supplies KIT [id number] Yes Dispense based on patient and insurance preference.  Use up to four times daily as directed. Cox, Kirsten, MD Taking Active   cetirizine (ZYRTEC) 10 MG tablet [id number] Yes Take 10 mg by mouth daily. [provider] Taking Active   ezetimibe (ZETIA) 10 MG tablet [id number] Yes Take 1 tablet (10 mg total) by mouth daily. Cox, Kirsten, MD Taking Active   FLUoxetine (PROZAC) 20 MG capsule [id number] Yes Take 1 capsule by mouth once daily Cox, Kirsten, MD Taking Active Self  fluticasone (FLONASE) 50 MCG/ACT nasal spray [id number] Yes Use 2 spray(s) in each nostril once daily [name], MD Taking Active Self  Glucosamine-Chondroitin-MSM 1500-1200-500 MG PACK [id number] Yes Take 1 tablet by mouth daily. [provider] Taking Active Self  metoprolol succinate (TOPROL XL) 25 MG 24 hr tablet [id number] Yes Take 0.5 tablets (12.5 mg total) by mouth daily. Berniece Salines, DO Taking Active Self  Multiple Vitamin (MULTIVITAMIN) capsule [id number] Yes Take 1 capsule by mouth daily. [provider] Taking Active Self  Omega-3 Fatty Acids (FISH OIL) 1000 MG CAPS [id number] Yes Take 1,000 mg by mouth 2 (two) times daily. [provider] Taking Active Self  omeprazole (PRILOSEC) 20 MG capsule [id number] Yes Take 1 capsule by mouth once daily Cox, Kirsten, MD Taking Active Self  rosuvastatin (CRESTOR) 40 MG tablet [id number] Yes Take 1 tablet (40 mg total) by mouth daily. Lillard Anes, MD  Taking Active Self  sacubitril-valsartan [facility]) 24-26 MG [id number] Yes Take 1 tablet by mouth 2 (two) times daily. Berniece Salines, DO Taking Active   spironolactone (ALDACTONE) 25 MG tablet [id number] Yes Take 0.5 tablets (12.5 mg total) by mouth daily. Tobb, Godfrey Pick, DO Taking Active   Vitamin D, Cholecalciferol, 25 MCG (1000 UT) CAPS [id number] Yes Take 1,000 Units by mouth daily. [provider] Taking Active Self            Patient Active Problem List   Diagnosis Date Noted   Nonischemic cardiomyopathy ([place]) 08/17/2020   LBBB (left bundle branch block) 08/17/2020   Closed fracture of distal end of left radius 08/16/2020   Dilated cardiomyopathy ([place]) 08/08/2020   Abnormal nuclear stress test 08/08/2020   Depressed left ventricular ejection fraction    Anxiety    Depression    Diabetes mellitus without complication ([place])    GERD (gastroesophageal reflux disease)    Hyperlipidemia    Mitral valve prolapse    Encounter for Medicare annual wellness exam 10/19/2019   Gastroesophageal reflux disease 10/19/2019   Hyperlipidemia associated with type 2 diabetes mellitus ([place]) 10/19/2019   Osteopenia 10/19/2019   Sinusitis 08/16/2019    Immunization History  Administered Date(s) Administered   Fluad Quad(high Dose 65+) 10/07/2018, 10/20/2019   Influenza, High Dose Seasonal PF 12/02/2017   Influenza, Seasonal, Injecte, Preservative Fre 11/22/2013   Influenza,inj,Quad PF,6-35 Mos 11/26/2012   Influenza-Unspecified 11/27/2009, 11/22/2010, 10/28/2011, 11/21/2014, 12/14/2015, 10/17/2016   Moderna Sars-Covid-2 Vaccination 04/16/2019, 05/17/2019, 01/17/2020   Pneumococcal Conjugate-13 12/22/2014   Pneumococcal Polysaccharide-23 03/22/2010   Tdap 11/30/2012    Conditions to be addressed/monitored:  Hyperlipidemia, Diabetes, GERD, and Depression  There are no care plans that you recently modified to display for this patient.    Medication Assistance: None required.   Patient affirms current coverage meets needs.  Compliance/Adherence/Medication fill history: Care Gaps: FOOT EXAM- Overdue - never done  OPHTHALMOLOGY EXAM (Yearly)  -Never done  Hepatitis C Screening (Once)- Never done Zoster Vaccines- Shingrix (1 of 2)- Never done PNA vac Low Risk Adult (2 of 2 - PPSV23)- Last completed: Dec 22, 2014 COVID-19 Vaccine (4 - Booster for [employer] series)- Last completed: Jan 17, 2020 INFLUENZA VACCINE (Every 8 Months, August to March)- Last completed: Oct 20, 2019 Annual Wellness Exam scheduled for 10/26/2020.   Star Rating Drugs: Rosuvastatin 40 mg- Last filled 08/25/2020 for 90 DS at [facility] 24/26 mg- Last filled 10/10/2020 for 30 DS at [place]    Patient's preferred pharmacy is:  [facility] [address], [place] [address] Phone: [phone] Fax: [phone]  Uses pill box? Yes Pt endorses good compliance  We discussed: Benefits of medication synchronization, packaging and delivery as well as enhanced pharmacist oversight with [employer]. Patient decided to: Continue current medication management strategy  Care Plan and Follow Up Patient Decision:  Patient agrees to Care Plan and Follow-up.  Plan: Telephone follow up appointment with care management team member scheduled for:  01/2021

## 2020-10-18 NOTE — Patient Instructions (Addendum)
Visit Information  Thank you for your time discussing your medications. I look forward to working with you to achieve your health care goals. Below is a summary of what we talked about during our visit.    Goals Addressed             This Visit's Progress    Manage My Medicine       Track and Manage My Symptoms-Depression       Track and Manage Symptoms-Heart Failure          There are no care plans to display for this patient.    Audrey Ruiz was given information about Chronic Care Management services today including:  CCM service includes personalized support from designated clinical staff supervised by her physician, including individualized plan of care and coordination with other care providers 24/7 contact phone numbers for assistance for urgent and routine care needs. Standard insurance, coinsurance, copays and deductibles apply for chronic care management only during months in which we provide at least 20 minutes of these services. Most insurances cover these services at 100%, however patients may be responsible for any copay, coinsurance and/or deductible if applicable. This service may help you avoid the need for more expensive face-to-face services. Only one practitioner may furnish and bill the service in a calendar month. The patient may stop CCM services at any time (effective at the end of the month) by phone call to the office staff.  Patient agreed to services and verbal consent obtained.   Patient verbalizes understanding of instructions provided today and agrees to view in [place].  Telephone follow up appointment with pharmacy team member scheduled for:01/2021  Audrey Ruiz, PharmD Clinical Pharmacist [place] [phone] (office) [phone] (mobile)

## 2020-10-19 ENCOUNTER — Ambulatory Visit (INDEPENDENT_AMBULATORY_CARE_PROVIDER_SITE_OTHER): Payer: PPO

## 2020-10-19 DIAGNOSIS — Z23 Encounter for immunization: Secondary | ICD-10-CM

## 2020-10-19 NOTE — Addendum Note (Signed)
Addended by: Alan Ripper A on: 10/19/2020 11:36 AM   Modules accepted: Orders

## 2020-10-26 ENCOUNTER — Ambulatory Visit (INDEPENDENT_AMBULATORY_CARE_PROVIDER_SITE_OTHER): Payer: PPO

## 2020-10-26 ENCOUNTER — Other Ambulatory Visit: Payer: Self-pay

## 2020-10-26 VITALS — BP 118/60 | HR 87 | Resp 16 | Ht 67.0 in | Wt 129.6 lb

## 2020-10-26 DIAGNOSIS — Z6820 Body mass index (BMI) 20.0-20.9, adult: Secondary | ICD-10-CM | POA: Diagnosis not present

## 2020-10-26 DIAGNOSIS — Z0000 Encounter for general adult medical examination without abnormal findings: Secondary | ICD-10-CM | POA: Diagnosis not present

## 2020-10-26 NOTE — Patient Instructions (Signed)
Fall Prevention in the Home, Adult Falls can cause injuries and can happen to people of all ages. There are many things you can do to make your home safe and to help prevent falls. Ask for help when making these changes. What actions can I take to prevent falls? General Instructions Use good lighting in all rooms. Replace any light bulbs that burn out. Turn on the lights in dark areas. Use night-lights. Keep items that you use often in easy-to-reach places. Lower the shelves around your home if needed. Set up your furniture so you have a clear path. Avoid moving your furniture around. Do not have throw rugs or other things on the floor that can make you trip. Avoid walking on wet floors. If any of your floors are uneven, fix them. Add color or contrast paint or tape to clearly mark and help you see: Grab bars or handrails. First and last steps of staircases. Where the edge of each step is. If you use a stepladder: Make sure that it is fully opened. Do not climb a closed stepladder. Make sure the sides of the stepladder are locked in place. Ask someone to hold the stepladder while you use it. Know where your pets are when moving through your home. What can I do in the bathroom?   Keep the floor dry. Clean up any water on the floor right away. Remove soap buildup in the tub or shower. Use nonskid mats or decals on the floor of the tub or shower. Attach bath mats securely with double-sided, nonslip rug tape. If you need to sit down in the shower, use a plastic, nonslip stool. Install grab bars by the toilet and in the tub and shower. Do not use towel bars as grab bars. What can I do in the bedroom? Make sure that you have a light by your bed that is easy to reach. Do not use any sheets or blankets for your bed that hang to the floor. Have a firm chair with side arms that you can use for support when you get dressed. What can I do in the kitchen? Clean up any spills right away. If you  need to reach something above you, use a step stool with a grab bar. Keep electrical cords out of the way. Do not use floor polish or wax that makes floors slippery. What can I do with my stairs? Do not leave any items on the stairs. Make sure that you have a light switch at the top and the bottom of the stairs. Make sure that there are handrails on both sides of the stairs. Fix handrails that are broken or loose. Install nonslip stair treads on all your stairs. Avoid having throw rugs at the top or bottom of the stairs. Choose a carpet that does not hide the edge of the steps on the stairs. Check carpeting to make sure that it is firmly attached to the stairs. Fix carpet that is loose or worn. What can I do on the outside of my home? Use bright outdoor lighting. Fix the edges of walkways and driveways and fix any cracks. Remove anything that might make you trip as you walk through a door, such as a raised step or threshold. Trim any bushes or trees on paths to your home. Check to see if handrails are loose or broken and that both sides of all steps have handrails. Install guardrails along the edges of any raised decks and porches. Clear paths of anything that can  make you trip, such as tools or rocks. Have leaves, snow, or ice cleared regularly. Use sand or salt on paths during winter. Clean up any spills in your garage right away. This includes grease or oil spills. What other actions can I take? Wear shoes that: Have a low heel. Do not wear high heels. Have rubber bottoms. Feel good on your feet and fit well. Are closed at the toe. Do not wear open-toe sandals. Use tools that help you move around if needed. These include: Canes. Walkers. Scooters. Crutches. Review your medicines with your doctor. Some medicines can make you feel dizzy. This can increase your chance of falling. Ask your doctor what else you can do to help prevent falls. Where to find more information Centers for  Disease Control and Prevention, STEAD[employer]: http://www.wolf.info/ National Institute on Aging: http://kim-miller.com/ Contact a doctor if: You are afraid of falling at home. You feel weak, drowsy, or dizzy at home. You fall at home. Summary There are many simple things that you can do to make your home safe and to help prevent falls. Ways to make your home safe include removing things that can make you trip and installing grab bars in the bathroom. Ask for help when making these changes in your home. This information is not intended to replace advice given to you by your health care provider. Make sure you discuss any questions you have with your health care provider. Document Revised: 09/08/2019 Document Reviewed: 09/08/2019 Elsevier Patient Education  [place] Maintenance, Female Adopting a healthy lifestyle and getting preventive care are important in promoting health and wellness. Ask your health care provider about: The right schedule for you to have regular tests and exams. Things you can do on your own to prevent diseases and keep yourself healthy. What should I know about diet, weight, and exercise? Eat a healthy diet  Eat a diet that includes plenty of vegetables, fruits, low-fat dairy products, and lean protein. Do not eat a lot of foods that are high in solid fats, added sugars, or sodium. Maintain a healthy weight Body mass index (BMI) is used to identify weight problems. It estimates body fat based on height and weight. Your health care provider can help determine your BMI and help you achieve or maintain a healthy weight. Get regular exercise Get regular exercise. This is one of the most important things you can do for your health. Most adults should: Exercise for at least 150 minutes each week. The exercise should increase your heart rate and make you sweat (moderate-intensity exercise). Do strengthening exercises at least twice a week. This is in addition to the  moderate-intensity exercise. Spend less time sitting. Even light physical activity can be beneficial. Watch cholesterol and blood lipids Have your blood tested for lipids and cholesterol at 72 years of age, then have this test every 5 years. Have your cholesterol levels checked more often if: Your lipid or cholesterol levels are high. You are older than 72 years of age. You are at high risk for heart disease. What should I know about cancer screening? Depending on your health history and family history, you may need to have cancer screening at various ages. This may include screening for: Breast cancer. Cervical cancer. Colorectal cancer. Skin cancer. Lung cancer. What should I know about heart disease, diabetes, and high blood pressure? Blood pressure and heart disease High blood pressure causes heart disease and increases the risk of stroke. This is more likely to develop in  people who have high blood pressure readings, are of African descent, or are overweight. Have your blood pressure checked: Every 3-5 years if you are 5-71 years of age. Every year if you are 35 years old or older. Diabetes Have regular diabetes screenings. This checks your fasting blood sugar level. Have the screening done: Once every three years after age 40 if you are at a normal weight and have a low risk for diabetes. More often and at a younger age if you are overweight or have a high risk for diabetes. What should I know about preventing infection? Hepatitis B If you have a higher risk for hepatitis B, you should be screened for this virus. Talk with your health care provider to find out if you are at risk for hepatitis B infection. Hepatitis C Testing is recommended for: Everyone born from [birth year] through 1967. Anyone with known risk factors for hepatitis C. Sexually transmitted infections (STIs) Get screened for STIs, including gonorrhea and chlamydia, if: You are sexually active and are younger than 72  years of age. You are older than 72 years of age and your health care provider tells you that you are at risk for this type of infection. Your sexual activity has changed since you were last screened, and you are at increased risk for chlamydia or gonorrhea. Ask your health care provider if you are at risk. Ask your health care provider about whether you are at high risk for HIV. Your health care provider may recommend a prescription medicine to help prevent HIV infection. If you choose to take medicine to prevent HIV, you should first get tested for HIV. You should then be tested every 3 months for as long as you are taking the medicine. Pregnancy If you are about to stop having your period (premenopausal) and you may become pregnant, seek counseling before you get pregnant. Take 400 to 800 micrograms (mcg) of folic acid every day if you become pregnant. Ask for birth control (contraception) if you want to prevent pregnancy. Osteoporosis and menopause Osteoporosis is a disease in which the bones lose minerals and strength with aging. This can result in bone fractures. If you are 30 years old or older, or if you are at risk for osteoporosis and fractures, ask your health care provider if you should: Be screened for bone loss. Take a calcium or vitamin D supplement to lower your risk of fractures. Be given hormone replacement therapy (HRT) to treat symptoms of menopause. Follow these instructions at home: Lifestyle Do not use any products that contain nicotine or tobacco, such as cigarettes, e-cigarettes, and chewing tobacco. If you need help quitting, ask your health care provider. Do not use street drugs. Do not share needles. Ask your health care provider for help if you need support or information about quitting drugs. Alcohol use Do not drink alcohol if: Your health care provider tells you not to drink. You are pregnant, may be pregnant, or are planning to become pregnant. If you drink  alcohol: Limit how much you use to 0-1 drink a day. Limit intake if you are breastfeeding. Be aware of how much alcohol is in your drink. In the [place] one drink equals one 12 oz bottle of beer (355 mL), one 5 oz glass of wine (148 mL), or one 1 oz glass of hard liquor (44 mL). General instructions Schedule regular health, dental, and eye exams. Stay current with your vaccines. Tell your health care provider if: You often feel depressed. You have  ever been abused or do not feel safe at home. Summary Adopting a healthy lifestyle and getting preventive care are important in promoting health and wellness. Follow your health care provider's instructions about healthy diet, exercising, and getting tested or screened for diseases. Follow your health care provider's instructions on monitoring your cholesterol and blood pressure. This information is not intended to replace advice given to you by your health care provider. Make sure you discuss any questions you have with your health care provider. Document Revised: 04/14/2020 Document Reviewed: 01/28/2018 Elsevier Patient Education  2022 [employer].

## 2020-10-26 NOTE — Progress Notes (Signed)
Subjective:   Audrey Ruiz is a 70 y.o. female who presents for Medicare Annual (Subsequent) preventive examination.  This wellness visit is conducted by a nurse.  The patient's medications were reviewed and reconciled since the patient's last visit.  History details were provided by the patient.  The history appears to be reliable.    Patient's last AWV was more than one year ago.   Medical History: Patient history and Family history was reviewed  Medications, Allergies, and preventative health maintenance was reviewed and updated.   Review of Systems    ROS - Negative Cardiac Risk Factors include: advanced age (>29mn, >>38women)     Objective:    Today's Vitals   10/26/20 1009  BP: 118/60  Pulse: 87  Resp: 16  Weight: 129 lb 9.6 oz (58.8 kg)  Height: 5' 7"  (1.702 m)  PainSc: 0-No pain   Body mass index is 20.3 kg/m.  Advanced Directives 10/26/2020 08/08/2020  Does Patient Have a Medical Advance Directive? Yes Yes  Type of [place]amed[place]of [place] will HClare Does patient want to make changes to medical advance directive? No - Patient declined -  Copy of HRoscommonin Chart? No - copy requested -    Current Medications (verified) Outpatient Encounter Medications as of 10/26/2020  Medication Sig   blood glucose meter kit and supplies KIT Dispense based on patient and insurance preference. Use up to four times daily as directed.   cetirizine (ZYRTEC) 10 MG tablet Take 10 mg by mouth daily.   ezetimibe (ZETIA) 10 MG tablet Take 1 tablet (10 mg total) by mouth daily.   FLUoxetine (PROZAC) 20 MG capsule Take 1 capsule by mouth once daily   fluticasone (FLONASE) 50 MCG/ACT nasal spray Use 2 spray(s) in each nostril once daily   Glucosamine-Chondroitin-MSM 1500-1200-500 MG PACK Take 1 tablet by mouth daily.   Lancets (ONETOUCH DELICA PLUS [id number] MISC Apply topically.   metoprolol succinate  (TOPROL XL) 25 MG 24 hr tablet Take 0.5 tablets (12.5 mg total) by mouth daily.   Multiple Vitamin (MULTIVITAMIN) capsule Take 1 capsule by mouth daily.   Omega-3 Fatty Acids (FISH OIL) 1000 MG CAPS Take 1,000 mg by mouth 2 (two) times daily.   omeprazole (PRILOSEC) 20 MG capsule Take 1 capsule by mouth once daily   ONETOUCH VERIO test strip SMARTSIG:Via Meter 1-4 Times Daily   rosuvastatin (CRESTOR) 40 MG tablet Take 1 tablet (40 mg total) by mouth daily.   sacubitril-valsartan (ENTRESTO) 24-26 MG Take 1 tablet by mouth 2 (two) times daily.   spironolactone (ALDACTONE) 25 MG tablet Take 0.5 tablets (12.5 mg total) by mouth daily.   Vitamin D, Cholecalciferol, 25 MCG (1000 UT) CAPS Take 1,000 Units by mouth daily.   No facility-administered encounter medications on file as of 10/26/2020.    Allergies (verified) Patient has no known allergies.   History: Past Medical History:  Diagnosis Date   Anxiety    Depression    Diabetes mellitus without complication ([place]    Gastroesophageal reflux disease 10/19/2019   GERD (gastroesophageal reflux disease)    Hyperlipidemia associated with type 2 diabetes mellitus ([place] 10/19/2019   Mitral valve prolapse    Osteopenia 10/19/2019   Sinusitis 08/16/2019   Past Surgical History:  Procedure Laterality Date   BREAST LUMPECTOMY Left 2010   LEFT HEART CATH AND CORONARY ANGIOGRAPHY N/A 08/08/2020   Procedure: LEFT HEART CATH AND CORONARY ANGIOGRAPHY;  Surgeon:  Leonie Man, MD;  Location: [facility];  Service: Cardiovascular;  Laterality: N/A;   Family History  Problem Relation Age of Onset   Osteoporosis Mother    Coronary artery disease Father    Diabetes Sister    Breast cancer Daughter    Diabetes Maternal Grandfather    Social History   Socioeconomic History   Marital status: Married    Spouse name: [name]   Number of children: 2   Years of education: Not on file   Highest education level: Master's degree (e.g., MA, MS,  MEng, MEd, MSW, MBA)  Occupational History   Occupation: Retired  Tobacco Use   Smoking status: Never   Smokeless tobacco: Never  Vaping Use   Vaping Use: Never used  Substance and Sexual Activity   Alcohol use: Yes    Alcohol/week: 2.0 standard drinks    Types: 2 Glasses of wine per week    Comment: occasional (white wine)   Drug use: Never  Social History Narrative   Son lives in [place], Daughter lives in [place]   Patient 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 mother and father as well as two aunts   Social Determinants of Radio broadcast assistant Strain: Not on file  Food Insecurity: No Food Insecurity   Worried About Charity fundraiser in the Last Year: Never true   Arboriculturist in the Last Year: Never true  Transportation Needs: No Transportation Needs   Lack of Transportation (Medical): No   Lack of Transportation (Non-Medical): No  Physical Activity: Sufficiently Active   Days of Exercise per Week: 6 days   Minutes of Exercise per Session: 30 min  Stress: Not on file  Social Connections: Not on file    Tobacco Counseling Counseling given: Tobacco products are not being used  Clinical Intake:  Pre-visit preparation completed: Yes Pain : No/denies pain Pain Score: 0-No pain   BMI - recorded: 20.3 Nutritional Status: BMI of 19-24  Normal Nutritional Risks: None Diabetes: No (Prediabetic) How often do you need to have someone help you when you read instructions, pamphlets, or other written materials from your doctor or pharmacy?: 1 - Never Interpreter Needed?: No    Activities of Daily Living In your present state of health, do you have any difficulty performing the following activities: 10/26/2020  Hearing? N  Vision? N  Difficulty concentrating or making decisions? N  Walking or climbing stairs? N  Dressing or bathing? N  Doing errands, shopping? N  Preparing Food and eating ? N  Using the Toilet? N  In the past six months, have you accidently leaked urine?  N  Do you have problems with loss of bowel control? N  Managing your Medications? N  Managing your Finances? N  Housekeeping or managing your Housekeeping? N  Some recent data might be hidden    Patient Care Team: [name], MD as PCP - General (Family Medicine) Marice Potter, MD as Consulting Physician (Oncology) Berniece Salines, DO as Consulting Physician (Cardiology) Brynda Peon, [place] as Physician Assistant (Orthopedic Surgery) Burnice Logan, [facility] as Pharmacist (Pharmacist) Lynnell Dike, OD (Optometry)  Indicate any recent Medical Services you may have received from other than [facility] providers in the past year (date may be approximate). [facility]    Assessment:   This is a routine wellness examination for [place].   Dietary issues and exercise activities discussed: Current Exercise Habits: Home exercise routine, Type of exercise: walking, Time (  Minutes): 30, Frequency (Times/Week): 5, Weekly Exercise (Minutes/Week): 150, Intensity: Mild, Exercise limited by: None identified  Depression Screen PHQ 2/9 Scores 10/26/2020 09/21/2020 06/01/2020 10/19/2019  PHQ - 2 Score 0 0 2 0  PHQ- 9 Score - - 3 1    Fall Risk Fall Risk  10/26/2020 09/30/2020 09/21/2020 10/19/2019  Falls in the past year? 1 - 1 0  Number falls in past yr: 1 - 1 0  Injury with Fall? 1 - 1 0  Risk for fall due to : History of fall(s) History of fall(s) - -  Follow up Falls evaluation completed;Education provided Falls evaluation completed;Falls prevention discussed - -    FALL RISK PREVENTION PERTAINING TO THE HOME:  Any stairs in or around the home? Yes  If so, are there any without handrails? No  Home free of loose throw rugs in walkways, pet beds, electrical cords, etc? Yes  Adequate lighting in your home to reduce risk of falls? Yes   ASSISTIVE DEVICES UTILIZED TO PREVENT FALLS:  Life alert? No  Use of a cane, walker or w/c? No  Grab bars in the bathroom? No  Shower chair or bench in shower?  No  Elevated toilet seat or a handicapped toilet? No   Gait steady and fast without use of assistive device  Cognitive Function:     6CIT Screen 10/26/2020  What Year? 0 points  What month? 0 points  What time? 0 points  Count back from 20 0 points  Months in reverse 0 points  Repeat phrase 0 points  Total Score 0    Immunizations Immunization History  Administered Date(s) Administered   Fluad Quad(high Dose 65+) 10/07/2018, 10/20/2019, 10/19/2020   Influenza, High Dose Seasonal PF 12/02/2017   Influenza, Seasonal, Injecte, Preservative Fre 11/22/2013   Influenza,inj,Quad PF,6-35 Mos 11/26/2012   Influenza-Unspecified 11/27/2009, 11/22/2010, 10/28/2011, 11/21/2014, 12/14/2015, 10/17/2016   Moderna Sars-Covid-2 Vaccination 04/16/2019, 05/17/2019, 01/17/2020   Pneumococcal Conjugate-13 12/22/2014   Pneumococcal Polysaccharide-23 03/22/2010, 10/19/2020   Tdap 11/30/2012    TDAP status: Up to date  Flu Vaccine status: Up to date  Pneumococcal vaccine status: Up to date  Covid-19 vaccine status: Information provided on how to obtain vaccines.   Qualifies for Shingles Vaccine? Yes   Zostavax completed No   Shingrix Completed?: No.    Education has been provided regarding the importance of this vaccine. Patient has been advised to call insurance company to determine out of pocket expense if they have not yet received this vaccine. Advised may also receive vaccine at local pharmacy or Health Dept. Verbalized acceptance and understanding.  Screening Tests Health Maintenance  Topic Date Due   FOOT EXAM  Never done   OPHTHALMOLOGY EXAM  Never done   Hepatitis C Screening  Never done   Zoster Vaccines- Shingrix (1 of 2) Never done   COVID-19 Vaccine (4 - Booster for Moderna series) 04/10/2020   HEMOGLOBIN A1C  03/24/2021   MAMMOGRAM  06/28/2021   TETANUS/TDAP  12/01/2022   COLONOSCOPY (Pts 45-33yr Insurance coverage will need to be confirmed)  06/22/2026   INFLUENZA VACCINE   Completed   DEXA SCAN  Completed   PNA vac Low Risk Adult  Completed   HPV VACCINES  Aged Out    Health Maintenance  Health Maintenance Due  Topic Date Due   FOOT EXAM  Never done   OPHTHALMOLOGY EXAM  Never done   Hepatitis C Screening  Never done   Zoster Vaccines- Shingrix (1 of 2) Never done  COVID-19 Vaccine (4 - Booster for Moderna series) 04/10/2020    Colorectal cancer screening: Type of screening: Colonoscopy. Completed 2028. Repeat every 10 years  Mammogram status: Due, last mammogram was 06/29/2019  Lung Cancer Screening: (Low Dose CT Chest recommended if Age 60-80 years, 30 pack-year currently smoking OR have quit w/in 15years.) does not qualify.    Additional Screening:  Vision Screening: Recommended annual ophthalmology exams for early detection of glaucoma and other disorders of the eye. Is the patient up to date with their annual eye exam?  Yes  Who is the provider or what is the name of the office in which the patient attends annual eye exams? Dr [facility]do [facility]  Dental Screening: Recommended annual dental exams for proper oral hygiene    Plan:    1- Mammogram and DEXA scan - patient will call to have our office schedule or will call to have Dr Jaclyn Shaggy office schedule once things slow down after her daughter's surgical recovery 2- COVID Booster - Fall booster is recommended - call our office in a couple of weeks to see if it is on hand or check with the pharmacy 3- Continue healthy eating and exercise 4- Advance Directive - bring a copy of your living will and healthcare power of attorney for your medical chart  I have personally reviewed and noted the following in the patient's chart:   Medical and social history Use of alcohol, tobacco or illicit drugs  Current medications and supplements including opioid prescriptions.  Functional ability and status Nutritional status Physical activity Advanced directives List of other  physicians Hospitalizations, surgeries, and ER visits in previous 12 months Vitals Screenings to include cognitive, depression, and falls Referrals and appointments  In addition, I have reviewed and discussed with patient certain preventive protocols, quality metrics, and best practice recommendations. A written personalized care plan for preventive services as well as general preventive health recommendations were provided to patient.     Erie Noe, LPN   07/27/3727

## 2020-10-31 ENCOUNTER — Ambulatory Visit: Payer: PPO | Admitting: Cardiology

## 2020-11-02 ENCOUNTER — Ambulatory Visit: Payer: PPO | Admitting: Cardiology

## 2020-11-07 ENCOUNTER — Other Ambulatory Visit: Payer: Self-pay

## 2020-11-09 ENCOUNTER — Encounter: Payer: Self-pay | Admitting: Cardiology

## 2020-11-09 ENCOUNTER — Other Ambulatory Visit: Payer: Self-pay

## 2020-11-09 ENCOUNTER — Ambulatory Visit (INDEPENDENT_AMBULATORY_CARE_PROVIDER_SITE_OTHER): Payer: PPO | Admitting: Cardiology

## 2020-11-09 VITALS — BP 98/58 | HR 60 | Ht 67.0 in | Wt 127.6 lb

## 2020-11-09 DIAGNOSIS — E785 Hyperlipidemia, unspecified: Secondary | ICD-10-CM | POA: Diagnosis not present

## 2020-11-09 DIAGNOSIS — I447 Left bundle-branch block, unspecified: Secondary | ICD-10-CM | POA: Diagnosis not present

## 2020-11-09 DIAGNOSIS — I420 Dilated cardiomyopathy: Secondary | ICD-10-CM

## 2020-11-09 DIAGNOSIS — E1169 Type 2 diabetes mellitus with other specified complication: Secondary | ICD-10-CM

## 2020-11-09 DIAGNOSIS — I428 Other cardiomyopathies: Secondary | ICD-10-CM

## 2020-11-09 DIAGNOSIS — I499 Cardiac arrhythmia, unspecified: Secondary | ICD-10-CM

## 2020-11-09 NOTE — Patient Instructions (Signed)
Medication Instructions:  Your physician recommends that you continue on your current medications as directed. Please refer to the Current Medication list given to you today.  *If you need a refill on your cardiac medications before your next appointment, please call your pharmacy*   Lab Work: None If you have labs (blood work) drawn today and your tests are completely normal, you will receive your results only by: [place] (if you have MyChart) OR A paper copy in the mail If you have any lab test that is abnormal or we need to change your treatment, we will call you to review the results.   Testing/Procedures: You will be scheduled for Cardiac MRI. Please arrive at the [facility] main entrance of [facility] 30-45 minutes prior to test start time  [facility]  [place], [place] [address]  [phone]  Proceed to the [facility] Radiology Department (First Floor).  ?  Magnetic resonance imaging (MRI) is a painless test that produces images of the inside of the body without using X-rays. During an MRI, strong magnets and radio waves work together in a Research officer, political party to form detailed images. MRI images may provide more details about a medical condition than X-rays, CT scans, and ultrasounds can provide.  -You may be given earphones to listen for instructions.  -You may eat a light breakfast and take medications as ordered.  -If a contrast material will be used, an IV will be inserted into one of your veins. Contrast material will be injected into your IV.  -You will be asked to remove all metal, including: Watch, jewelry, and other metal objects including hearing aids, hair pieces and dentures. (Braces and fillings normally are not a problem.)  -If contrast material was used:      It will leave your body through your urine within a day. You may be told to drink plenty of fluids to help flush the contrast material out of your system.  TEST WILL  TAKE APPROXIMATELY 1 HOUR  PLEASE NOTIFY SCHEDULING AT LEAST 24 HOURS IN ADVANCE IF YOU ARE UNABLE TO KEEP YOUR APPOINTMENT.      Follow-Up: At [facility], you and your health needs are our priority.  As part of our continuing mission to provide you with exceptional heart care, we have created designated Provider Care Teams.  These Care Teams include your primary Cardiologist (physician) and Advanced Practice Providers (APPs -  Physician Assistants and Nurse Practitioners) who all work together to provide you with the care you need, when you need it.  We recommend signing up for the patient portal called "MyChart".  Sign up information is provided on this After Visit Summary.  MyChart is used to connect with patients for Virtual Visits (Telemedicine).  Patients are able to view lab/test results, encounter notes, upcoming appointments, etc.  Non-urgent messages can be sent to your provider as well.   To learn more about what you can do with MyChart, go to [email or web address].    Your next appointment:   January 2023  The format for your next appointment:   In Person  Provider:   Berniece Salines, DO [address], [place], [place] [address]    Other Instructions

## 2020-11-09 NOTE — Progress Notes (Signed)
Cardiology Office Note:    Date:  11/09/2020   ID:  Audrey Ruiz Audrey Ruiz, DOB Feb 06, 1951, MRN [id number]  PCP:  [name], MD  Cardiologist:  Berniece Salines, DO  Electrophysiologist:  None   Referring MD: [name], MD   " I am doing fine"  History of Present Illness:    Audrey Ruiz is a 70 y.o. female with a hx of  hx of hyperlipidemia, diabetes type 2, depressed ejection fraction 3540% on June 2022, nonischemic cardiomyopathy based on left heart catheterization done in June 2022, breast cancer status postchemotherapy with Adriamycin 10 years ago is here today for follow-up visit.   At her initial visit I recommended the patient get a nuclear stress test and echocardiogram with this both showed depressed ejection fraction.  I sent her for a heart catheterization which was normal without any evidence of coronary artery disease.  At her visit in June 2022, will continue on Entresto and add Aldactone.  At that visit we also discussed planning for repeat echocardiogram in September as we had started discussing resynchronization therapy but unfortunately the patient got this echocardiogram in July which did not show any improvement.  In the interim she did see my partner Dr. [name].  Today she is here for follow-up visit she had a few questions about the resynchronization therapy.  She tells me that most days she is fine and is able to do her activities but some days she is very tired.  She is going through some social issues right now with her daughter who has been diagnosed with cancer.  She also does have questions about the CRT therapy in her condition.  Past Medical History:  Diagnosis Date   Abnormal nuclear stress test 08/08/2020   Anxiety    Closed fracture of distal end of left radius 08/16/2020   Depressed left ventricular ejection fraction    Depression    Diabetes mellitus without complication ([place])    Dilated cardiomyopathy ([place]) 08/08/2020   Encounter  for Medicare annual wellness exam 10/19/2019   Gastroesophageal reflux disease 10/19/2019   GERD (gastroesophageal reflux disease)    Hyperlipidemia    Hyperlipidemia associated with type 2 diabetes mellitus ([place]) 10/19/2019   LBBB (left bundle branch block) 08/17/2020   Mitral valve prolapse    Nonischemic cardiomyopathy ([place]) 08/17/2020   Osteopenia 10/19/2019   Sinusitis 08/16/2019    Past Surgical History:  Procedure Laterality Date   BREAST LUMPECTOMY Left 2010   LEFT HEART CATH AND CORONARY ANGIOGRAPHY N/A 08/08/2020   Procedure: LEFT HEART CATH AND CORONARY ANGIOGRAPHY;  Surgeon: Leonie Man, MD;  Location: [place];  Service: Cardiovascular;  Laterality: N/A;    Current Medications: Current Meds  Medication Sig   cetirizine (ZYRTEC) 10 MG tablet Take 10 mg by mouth daily.   ezetimibe (ZETIA) 10 MG tablet Take 1 tablet (10 mg total) by mouth daily.   FLUoxetine (PROZAC) 20 MG capsule Take 1 capsule by mouth once daily   fluticasone (FLONASE) 50 MCG/ACT nasal spray Use 2 spray(s) in each nostril once daily   Glucosamine-Chondroitin-MSM 1500-1200-500 MG PACK Take 1 tablet by mouth daily.   metoprolol succinate (TOPROL XL) 25 MG 24 hr tablet Take 0.5 tablets (12.5 mg total) by mouth daily.   Multiple Vitamin (MULTIVITAMIN) capsule Take 1 capsule by mouth daily.   Omega-3 Fatty Acids (FISH OIL) 1000 MG CAPS Take 1,000 mg by mouth 2 (two) times daily.   omeprazole (PRILOSEC) 20 MG capsule  Take 1 capsule by mouth once daily   rosuvastatin (CRESTOR) 40 MG tablet Take 1 tablet (40 mg total) by mouth daily.   sacubitril-valsartan (ENTRESTO) 24-26 MG Take 1 tablet by mouth 2 (two) times daily.   spironolactone (ALDACTONE) 25 MG tablet Take 0.5 tablets (12.5 mg total) by mouth daily.   Vitamin D, Cholecalciferol, 25 MCG (1000 UT) CAPS Take 1,000 Units by mouth daily.     Allergies:   Patient has no known allergies.   Social History   Socioeconomic History   Marital  status: Married    Spouse name: [name]   Number of children: 2   Years of education: Not on file   Highest education level: Master's degree (e.g., MA, MS, MEng, MEd, MSW, MBA)  Occupational History   Occupation: Retired  Tobacco Use   Smoking status: Never   Smokeless tobacco: Never  Vaping Use   Vaping Use: Never used  Substance and Sexual Activity   Alcohol use: Yes    Alcohol/week: 2.0 standard drinks    Types: 2 Glasses of wine per week    Comment: occasional (white wine)   Drug use: Never   Sexual activity: Not Currently  Other Topics Concern   Not on file  Social History Narrative   Son lives in [place], Daughter lives in [place]   Patient 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 mother and father as well as two aunts   Social Determinants of Health   Financial Resource Strain: Not on file  Food Insecurity: No Food Insecurity   Worried About Charity fundraiser in the Last Year: Never true   Arboriculturist in the Last Year: Never true  Transportation Needs: No Transportation Needs   Lack of Transportation (Medical): No   Lack of Transportation (Non-Medical): No  Physical Activity: Sufficiently Active   Days of Exercise per Week: 6 days   Minutes of Exercise per Session: 30 min  Stress: Not on file  Social Connections: Not on file     Family History: The patient's family history includes Breast cancer in her daughter; Coronary artery disease in her father; Diabetes in her maternal grandfather and sister; Osteoporosis in her mother.  ROS:   Review of Systems  Constitution: Negative for decreased appetite, fever and weight gain.  HENT: Negative for congestion, ear discharge, hoarse voice and sore throat.   Eyes: Negative for discharge, redness, vision loss in right eye and visual halos.  Cardiovascular: Negative for chest pain, dyspnea on exertion, leg swelling, orthopnea and palpitations.  Respiratory: Negative for cough, hemoptysis, shortness of breath and snoring.    Endocrine: Negative for heat intolerance and polyphagia.  Hematologic/Lymphatic: Negative for bleeding problem. Does not bruise/bleed easily.  Skin: Negative for flushing, nail changes, rash and suspicious lesions.  Musculoskeletal: Negative for arthritis, joint pain, muscle cramps, myalgias, neck pain and stiffness.  Gastrointestinal: Negative for abdominal pain, bowel incontinence, diarrhea and excessive appetite.  Genitourinary: Negative for decreased libido, genital sores and incomplete emptying.  Neurological: Negative for brief paralysis, focal weakness, headaches and loss of balance.  Psychiatric/Behavioral: Negative for altered mental status, depression and suicidal ideas.  Allergic/Immunologic: Negative for HIV exposure and persistent infections.    EKGs/Labs/Other Studies Reviewed:    The following studies were reviewed today:   EKG: None today  TTE 09/06/2020 IMPRESSIONS   1. Dysynchronous LV most likely secondary to IVCD. In view of this, EF assessment is not optimal. May consider other modalities for better  assessment.. Left ventricular ejection  fraction, by estimation, is 30 to  35%. The left ventricle has moderately  decreased function. The left ventricle has no regional wall motion  abnormalities. Left ventricular diastolic parameters are consistent with  Grade I diastolic dysfunction (impaired relaxation).   2. Right ventricular systolic function is normal. The right ventricular  size is normal. There is normal pulmonary artery systolic pressure.   3. The mitral valve is degenerative. No evidence of mitral valve  regurgitation. No evidence of mitral stenosis.   4. The aortic valve is normal in structure. Aortic valve regurgitation is  not visualized. No aortic stenosis is present.   5. The inferior vena cava is normal in size with greater than 50%  respiratory variability, suggesting right atrial pressure of 3 mmHg.   FINDINGS   Left Ventricle: Dysynchronous LV  most likely secondary to IVCD. In view  of this, EF assessment is not optimal. May consider other modalities for  better assessment. Left ventricular ejection fraction, by estimation, is  30 to 35%. The left ventricle has  moderately decreased function. The left ventricle has no regional wall  motion abnormalities. The left ventricular internal cavity size was normal  in size. There is no left ventricular hypertrophy. Left ventricular  diastolic parameters are consistent with   Grade I diastolic dysfunction (impaired relaxation).   Right Ventricle: The right ventricular size is normal. No increase in right ventricular wall thickness. Right ventricular systolic function is  normal. There is normal pulmonary artery systolic pressure. The tricuspid regurgitant velocity is 2.35 m/s, and   with an assumed right atrial pressure of 3 mmHg, the estimated right ventricular systolic pressure is 40.9 mmHg.   Left Atrium: Left atrial size was normal in size.   Right Atrium: Right atrial size was normal in size.   Pericardium: There is no evidence of pericardial effusion.   Mitral Valve: The mitral valve is degenerative in appearance. No evidence of mitral valve regurgitation. No evidence of mitral valve stenosis.   Tricuspid Valve: The tricuspid valve is normal in structure. Tricuspid valve regurgitation is not demonstrated. No evidence of tricuspid  stenosis.   Aortic Valve: The aortic valve is normal in structure. Aortic valve regurgitation is not visualized. No aortic stenosis is present.   Pulmonic Valve: The pulmonic valve was normal in structure. Pulmonic valve regurgitation is not visualized. No evidence of pulmonic stenosis.   Aorta: The aortic root is normal in size and structure.   Venous: The inferior vena cava is normal in size with greater than 50% respiratory variability, suggesting right atrial pressure of 3 mmHg.   IAS/Shunts: No atrial level shunt detected by color flow Doppler.    Left heart catheterization August 08, 2020 Angiographically minimal CAD, very tortuous. Proximal LAD appears to have a short subepicardial segment without obvious bridging. Somewhat diminutive but patent. 2 major septal perforators. LV end diastolic pressure is low. There is no aortic valve stenosis.   SUMMARY Angiographically minimal CAD with short subepicardial portion of the proximal LAD that is relatively diminutive, but widely patent.  Very tortuous vessels. Normal to low LVEDP. Nonischemic Cardiomyopathy: question if this is related to Left Bundle Branch Block with False Positive Stress Test related to Left Bundle Branch Block Wall Motion.    RECOMMENDATIONS Continue guideline directed medical therapy - consider use of long active Nitrate based upon the response to IC NTG Consider referral for CRT/BiV pacing.  Recent Labs: 06/01/2020: TSH 2.150 08/17/2020: Magnesium 2.0 09/21/2020: ALT 16; BUN 15; Creatinine, Ser 0.70; Hemoglobin  12.1; Platelets 289; Potassium 5.0; Sodium 140  Recent Lipid Panel    Component Value Date/Time   CHOL 175 09/21/2020 0847   TRIG 60 09/21/2020 0847   HDL 61 09/21/2020 0847   CHOLHDL 2.9 09/21/2020 0847   LDLCALC 102 (H) 09/21/2020 0847    Physical Exam:    VS:  BP (!) 98/58   Pulse 60   Ht 5\' 7"  (1.702 m)   Wt 127 lb 9.6 oz (57.9 kg)   SpO2 98%   BMI 19.98 kg/m     Wt Readings from Last 3 Encounters:  11/09/20 127 lb 9.6 oz (57.9 kg)  10/26/20 129 lb 9.6 oz (58.8 kg)  09/21/20 126 lb 9.6 oz (57.4 kg)     GEN: Well nourished, well developed in no acute distress HEENT: Normal NECK: No JVD; No carotid bruits LYMPHATICS: No lymphadenopathy CARDIAC: S1S2 noted,RRR, no murmurs, rubs, gallops RESPIRATORY:  Clear to auscultation without rales, wheezing or rhonchi  ABDOMEN: Soft, non-tender, non-distended, +bowel sounds, no guarding. EXTREMITIES: No edema, No cyanosis, no clubbing MUSCULOSKELETAL:  No deformity  SKIN: Warm and  dry NEUROLOGIC:  Alert and oriented x 3, non-focal PSYCHIATRIC:  Normal affect, good insight  ASSESSMENT:    1. Nonischemic cardiomyopathy (HCC)   2. Cardiac arrhythmia, unspecified cardiac arrhythmia type   3. Dilated cardiomyopathy ([place])   4. LBBB (left bundle branch block)   5. Hyperlipidemia associated with type 2 diabetes mellitus ([place])    PLAN:     Unfortunately the patient did get her echocardiogram sooner than I would have liked.  But still did not show any improvement.  Today we spoke in great details about the plans moving forward.  I do believe that she would benefit from EP evaluation given her left bundle branch block with concern for dyssynchrony and her depressed ejection fraction.she may benefit from cardiac resynchronization therapy.  In the meantime I will get cardiac MRI to assess LV function and for any other nonischemic etiologies.  She is on Entresto 24-26, Aldactone 12.5 mg and Toprol-XL 12.5.  Unfortunately I cannot go up on any of these medications given the patient marginal blood pressure.  We will also hold off on her SGL 2 inhibitors at this time.  She had a lot of questions during this visit which I was able to answer to the patient satisfaction.  The patient is in agreement with the above plan. The patient left the office in stable condition.  The patient will follow up in 3 to 4 months she wishes to follow me in [place].   Medication Adjustments/Labs and Tests Ordered: Current medicines are reviewed at length with the patient today.  Concerns regarding medicines are outlined above.  Orders Placed This Encounter  Procedures   MR [place]   Ambulatory referral to Cardiac Electrophysiology   No orders of the defined types were placed in this encounter.   Patient Instructions  Medication Instructions:  Your physician recommends that you continue on your current medications as directed. Please refer to the Current Medication  list given to you today.  *If you need a refill on your cardiac medications before your next appointment, please call your pharmacy*   Lab Work: None If you have labs (blood work) drawn today and your tests are completely normal, you will receive your results only by: [place] (if you have MyChart) OR A paper copy in the mail If you have any lab test that is abnormal or we need to  change your treatment, we will call you to review the results.   Testing/Procedures: You will be scheduled for Cardiac MRI. Please arrive at the [facility] main entrance of [facility] 30-45 minutes prior to test start time  [facility]  [place], [place] [address]  [phone]  Proceed to the [facility] Radiology Department (First Floor).  ?  Magnetic resonance imaging (MRI) is a painless test that produces images of the inside of the body without using X-rays. During an MRI, strong magnets and radio waves work together in a Research officer, political party to form detailed images. MRI images may provide more details about a medical condition than X-rays, CT scans, and ultrasounds can provide.  -You may be given earphones to listen for instructions.  -You may eat a light breakfast and take medications as ordered.  -If a contrast material will be used, an IV will be inserted into one of your veins. Contrast material will be injected into your IV.  -You will be asked to remove all metal, including: Watch, jewelry, and other metal objects including hearing aids, hair pieces and dentures. (Braces and fillings normally are not a problem.)  -If contrast material was used:      It will leave your body through your urine within a day. You may be told to drink plenty of fluids to help flush the contrast material out of your system.  TEST WILL TAKE APPROXIMATELY 1 HOUR  PLEASE NOTIFY SCHEDULING AT LEAST 24 HOURS IN ADVANCE IF YOU ARE UNABLE TO KEEP YOUR APPOINTMENT.      Follow-Up: At  [facility], you and your health needs are our priority.  As part of our continuing mission to provide you with exceptional heart care, we have created designated Provider Care Teams.  These Care Teams include your primary Cardiologist (physician) and Advanced Practice Providers (APPs -  Physician Assistants and Nurse Practitioners) who all work together to provide you with the care you need, when you need it.  We recommend signing up for the patient portal called "MyChart".  Sign up information is provided on this After Visit Summary.  MyChart is used to connect with patients for Virtual Visits (Telemedicine).  Patients are able to view lab/test results, encounter notes, upcoming appointments, etc.  Non-urgent messages can be sent to your provider as well.   To learn more about what you can do with MyChart, go to [email or web address].    Your next appointment:   January 2023  The format for your next appointment:   In Person  Provider:   Berniece Salines, DO [address], [place], [place] [address]    Other Instructions     Adopting a Healthy Lifestyle.  Know what a healthy weight is for you (roughly BMI <25) and aim to maintain this   Aim for 7+ servings of fruits and vegetables daily   65-80+ fluid ounces of water or unsweet tea for healthy kidneys   Limit to max 1 drink of alcohol per day; avoid smoking/tobacco   Limit animal fats in diet for cholesterol and heart health - choose grass fed whenever available   Avoid highly processed foods, and foods high in saturated/trans fats   Aim for low stress - take time to unwind and care for your mental health   Aim for 150 min of moderate intensity exercise weekly for heart health, and weights twice weekly for bone health   Aim for 7-9 hours of sleep daily  When it comes to diets, agreement about the perfect plan isnt easy to find, even among the experts. Experts at the [employer] developed an idea  known as the Healthy Eating Plate. Just imagine a plate divided into logical, healthy portions.   The emphasis is on diet quality:   Load up on vegetables and fruits - one-half of your plate: Aim for color and variety, and remember that potatoes dont count.   Go for whole grains - one-quarter of your plate: Whole wheat, barley, wheat berries, quinoa, oats, brown rice, and foods made with them. If you want pasta, go with whole wheat pasta.   Protein power - one-quarter of your plate: Fish, chicken, beans, and nuts are all healthy, versatile protein sources. Limit red meat.   The diet, however, does go beyond the plate, offering a few other suggestions.   Use healthy plant oils, such as olive, canola, soy, corn, sunflower and peanut. Check the labels, and avoid partially hydrogenated oil, which have unhealthy trans fats.   If youre thirsty, drink water. Coffee and tea are good in moderation, but skip sugary drinks and limit milk and dairy products to one or two daily servings.   The type of carbohydrate in the diet is more important than the amount. Some sources of carbohydrates, such as vegetables, fruits, whole grains, and beans-are healthier than others.   Finally, stay active  Signed, Berniece Salines, DO  11/09/2020 10:40 AM    [place]

## 2020-11-14 ENCOUNTER — Ambulatory Visit: Payer: PPO

## 2020-11-16 DIAGNOSIS — I110 Hypertensive heart disease with heart failure: Secondary | ICD-10-CM | POA: Diagnosis not present

## 2020-11-16 DIAGNOSIS — F3342 Major depressive disorder, recurrent, in full remission: Secondary | ICD-10-CM | POA: Diagnosis not present

## 2020-11-16 DIAGNOSIS — Z853 Personal history of malignant neoplasm of breast: Secondary | ICD-10-CM | POA: Diagnosis not present

## 2020-11-16 DIAGNOSIS — Z9012 Acquired absence of left breast and nipple: Secondary | ICD-10-CM | POA: Diagnosis not present

## 2020-11-16 DIAGNOSIS — Z923 Personal history of irradiation: Secondary | ICD-10-CM | POA: Diagnosis not present

## 2020-11-16 DIAGNOSIS — E785 Hyperlipidemia, unspecified: Secondary | ICD-10-CM | POA: Diagnosis not present

## 2020-11-16 DIAGNOSIS — I509 Heart failure, unspecified: Secondary | ICD-10-CM | POA: Diagnosis not present

## 2020-11-16 DIAGNOSIS — Z9221 Personal history of antineoplastic chemotherapy: Secondary | ICD-10-CM | POA: Diagnosis not present

## 2020-11-16 DIAGNOSIS — E261 Secondary hyperaldosteronism: Secondary | ICD-10-CM | POA: Diagnosis not present

## 2020-11-16 DIAGNOSIS — K219 Gastro-esophageal reflux disease without esophagitis: Secondary | ICD-10-CM | POA: Diagnosis not present

## 2020-11-24 ENCOUNTER — Telehealth: Payer: Self-pay

## 2020-11-24 NOTE — Chronic Care Management (AMB) (Signed)
    Chronic Care Management Pharmacy Assistant   Name: Trinitey Roache  MRN: [id number] DOB: [birth year]  Reason for Encounter: General Adherence Call    Recent office visits:  10/26/20 Rhae Hammock LPN. Annual Wellness Visit. No med changes. Follow up in 1 year.  Recent consult visits:  11/09/20 (Cardiology) Berniece Salines DO. Seen for Nonischemic Cardiomyopathy. Ordered MR and referral to Cardiac Electrophysiology. No Med changes  Hospital visits:  None since 10/12/20  Medications: Outpatient Encounter Medications as of 11/24/2020  Medication Sig   cetirizine (ZYRTEC) 10 MG tablet Take 10 mg by mouth daily.   ezetimibe (ZETIA) 10 MG tablet Take 1 tablet (10 mg total) by mouth daily.   FLUoxetine (PROZAC) 20 MG capsule Take 1 capsule by mouth once daily   fluticasone (FLONASE) 50 MCG/ACT nasal spray Use 2 spray(s) in each nostril once daily   Glucosamine-Chondroitin-MSM 1500-1200-500 MG PACK Take 1 tablet by mouth daily.   metoprolol succinate (TOPROL XL) 25 MG 24 hr tablet Take 0.5 tablets (12.5 mg total) by mouth daily.   Multiple Vitamin (MULTIVITAMIN) capsule Take 1 capsule by mouth daily.   Omega-3 Fatty Acids (FISH OIL) 1000 MG CAPS Take 1,000 mg by mouth 2 (two) times daily.   omeprazole (PRILOSEC) 20 MG capsule Take 1 capsule by mouth once daily   rosuvastatin (CRESTOR) 40 MG tablet Take 1 tablet (40 mg total) by mouth daily.   sacubitril-valsartan (ENTRESTO) 24-26 MG Take 1 tablet by mouth 2 (two) times daily.   spironolactone (ALDACTONE) 25 MG tablet Take 0.5 tablets (12.5 mg total) by mouth daily.   Vitamin D, Cholecalciferol, 25 MCG (1000 UT) CAPS Take 1,000 Units by mouth daily.   No facility-administered encounter medications on file as of 11/24/2020.   Contacted [name] for general disease state and medication adherence call.   Patient is not > 5 days past due for refill on the following medications per chart history:  [facility]  Medications: Medication Name/mg Last Fill Days Supply Rosuvastatin 40 mg  11/17/20 90ds   What concerns do you have about your medications? Pt stated everything is going good   The patient denies side effects with her medications.   How often do you forget or accidentally miss a dose? Never  Do you use a pillbox? Yes, she keeps them organized very well   Are you having any problems getting your medications from your pharmacy? Pt has no issues   Has the cost of your medications been a concern? No  Since last visit with CPP, no interventions have been made:   The patient has not had an ED visit since last contact.   The patient denies problems with their health.   Patient states BP and BG readings are as follows: Pt checks when something feels off and did not have any readings handy.    Care Gaps: Last annual wellness visit? Done on 0/9/98 If applicable: Last eye exam / retinopathy screening? Never done  Diabetic foot exam? Never done    [name], [place] Pharmacist Assistant  [phone]

## 2020-11-27 ENCOUNTER — Other Ambulatory Visit: Payer: Self-pay | Admitting: Family Medicine

## 2020-11-27 ENCOUNTER — Other Ambulatory Visit: Payer: Self-pay

## 2020-11-27 DIAGNOSIS — I420 Dilated cardiomyopathy: Secondary | ICD-10-CM | POA: Diagnosis not present

## 2020-11-27 DIAGNOSIS — K219 Gastro-esophageal reflux disease without esophagitis: Secondary | ICD-10-CM

## 2020-11-27 LAB — BASIC METABOLIC PANEL
BUN/Creatinine Ratio: 17 (ref 12–28)
BUN: 12 mg/dL (ref 8–27)
CO2: 22 mmol/L (ref 20–29)
Calcium: 10.2 mg/dL (ref 8.7–10.3)
Chloride: 103 mmol/L (ref 96–106)
Creatinine, Ser: 0.72 mg/dL (ref 0.57–1.00)
Glucose: 100 mg/dL — ABNORMAL HIGH (ref 70–99)
Potassium: 4.7 mmol/L (ref 3.5–5.2)
Sodium: 138 mmol/L (ref 134–144)
eGFR: 89 mL/min/{1.73_m2} (ref >59)

## 2020-11-27 NOTE — Progress Notes (Signed)
mp

## 2020-12-01 ENCOUNTER — Telehealth (HOSPITAL_COMMUNITY): Payer: Self-pay | Admitting: *Deleted

## 2020-12-01 NOTE — Telephone Encounter (Signed)
Reaching out to patient to offer assistance regarding upcoming cardiac imaging study; pt verbalizes understanding of appt date/time, parking situation and where to check in, and verified current allergies; name and call back number provided for further questions should they arise  Gordy Clement RN [facility] [phone] office [phone] cell  Patient denies any metal or claustrophobia.

## 2020-12-04 ENCOUNTER — Other Ambulatory Visit: Payer: Self-pay

## 2020-12-04 ENCOUNTER — Ambulatory Visit (HOSPITAL_COMMUNITY)
Admission: RE | Admit: 2020-12-04 | Discharge: 2020-12-04 | Disposition: A | Discharge status: Home / Self Care | Payer: PPO | Source: Ambulatory Visit | Attending: Cardiology | Admitting: Cardiology

## 2020-12-04 DIAGNOSIS — I428 Other cardiomyopathies: Secondary | ICD-10-CM | POA: Diagnosis not present

## 2020-12-04 MED ORDER — GADOBUTROL 1 MMOL/ML IV SOLN
7.0000 mL | Freq: Once | INTRAVENOUS | Status: AC | PRN
  Administered 2020-12-04: 7 mL via INTRAVENOUS

## 2020-12-20 ENCOUNTER — Telehealth: Payer: Self-pay | Admitting: Oncology

## 2020-12-20 NOTE — Telephone Encounter (Signed)
12/20/20 Spoke with patient about upcoming appts.She requested to cancel the bone density test.

## 2020-12-25 ENCOUNTER — Encounter: Payer: Self-pay | Admitting: Family Medicine

## 2020-12-25 ENCOUNTER — Other Ambulatory Visit: Payer: Self-pay

## 2020-12-25 ENCOUNTER — Ambulatory Visit (INDEPENDENT_AMBULATORY_CARE_PROVIDER_SITE_OTHER): Payer: HMO | Admitting: Family Medicine

## 2020-12-25 ENCOUNTER — Ambulatory Visit (INDEPENDENT_AMBULATORY_CARE_PROVIDER_SITE_OTHER): Payer: HMO

## 2020-12-25 VITALS — BP 92/58 | HR 58 | Temp 97.9°F | Resp 16 | Ht 67.0 in | Wt 127.0 lb

## 2020-12-25 DIAGNOSIS — I428 Other cardiomyopathies: Secondary | ICD-10-CM | POA: Diagnosis not present

## 2020-12-25 DIAGNOSIS — I5022 Chronic systolic (congestive) heart failure: Secondary | ICD-10-CM | POA: Diagnosis not present

## 2020-12-25 DIAGNOSIS — F330 Major depressive disorder, recurrent, mild: Secondary | ICD-10-CM | POA: Diagnosis not present

## 2020-12-25 DIAGNOSIS — E782 Mixed hyperlipidemia: Secondary | ICD-10-CM

## 2020-12-25 DIAGNOSIS — M67471 Ganglion, right ankle and foot: Secondary | ICD-10-CM | POA: Insufficient documentation

## 2020-12-25 DIAGNOSIS — E1169 Type 2 diabetes mellitus with other specified complication: Secondary | ICD-10-CM

## 2020-12-25 DIAGNOSIS — K219 Gastro-esophageal reflux disease without esophagitis: Secondary | ICD-10-CM

## 2020-12-25 DIAGNOSIS — E785 Hyperlipidemia, unspecified: Secondary | ICD-10-CM | POA: Diagnosis not present

## 2020-12-25 DIAGNOSIS — Z23 Encounter for immunization: Secondary | ICD-10-CM | POA: Diagnosis not present

## 2020-12-25 DIAGNOSIS — R7303 Prediabetes: Secondary | ICD-10-CM | POA: Diagnosis not present

## 2020-12-25 MED ORDER — LORAZEPAM 0.5 MG PO TABS: 0.5000 mg | ORAL_TABLET | Freq: Every evening | ORAL | 1 refills | Status: DC | PRN

## 2020-12-25 NOTE — Assessment & Plan Note (Signed)
Management per specialist - Dr. Harriet Masson. Continue current medications including entresto, toprol xl, and spironolactone.

## 2020-12-25 NOTE — Assessment & Plan Note (Signed)
Check a1c 

## 2020-12-25 NOTE — Assessment & Plan Note (Signed)
The current medical regimen is effective;  continue present plan and medications. Continue omeprazole 20 mg once daily.  

## 2020-12-25 NOTE — Progress Notes (Signed)
Subjective:  Patient ID: Audrey Ruiz, female    DOB: 12/29/1950  Age: 70 y.o. MRN: [id number]  Chief Complaint  Patient presents with   Hyperlipidemia   Gastroesophageal Reflux    HPI: Hyperlipidemia: on zetia 10 mg once daily, on crestor 40 mg once daily at night.   GERD: Currently on omeprazole 20 mg once daily.  Depression, mild, recurrent: Currently on Prozac 20 mg once daily.  Patient has been having difficulty sleeping.  She uses melatonin.  She has used lorazepam sparingly in the past.  Diabetes: Hemoglobin A1c done in August 2022 was 6 in 09/2020. Pt eats healthy and is very active.   Nonischemic cardiomyopathy: seeing Dr. Harriet Masson. Scheduled to see Dr. Curt Bears in about 2 weeks. She is concerned because not sure if needs a pacemaker. Her pulse is usually in the upper 50s per pt. She had a holter monitor in 2018 which showed frequent pvcs and some bradyarrhthmias. ECHO: EF 30-35%. (07/24/2020) Nuclear stress test: positive (07/13/2020) LHC: normal coronaries. EF low. (08/08/2020.) Consider referral for CRT/BiV pacing. Started on entresto, spironolactone, toprol xl.  Insomnia: takes melatonin would like lorazepam.  Patient was given a prescription for lorazepam nearly 2 years ago for 30 pills and has not used them up.     Current Outpatient Medications on File Prior to Visit  Medication Sig Dispense Refill   cetirizine (ZYRTEC) 10 MG tablet Take 10 mg by mouth daily.     ezetimibe (ZETIA) 10 MG tablet Take 1 tablet by mouth once daily 90 tablet 0   FLUoxetine (PROZAC) 20 MG capsule Take 1 capsule by mouth once daily 90 capsule 3   fluticasone (FLONASE) 50 MCG/ACT nasal spray Use 2 spray(s) in each nostril once daily 48 g 3   Glucosamine-Chondroitin-MSM 1500-1200-500 MG PACK Take 1 tablet by mouth daily.     metoprolol succinate (TOPROL XL) 25 MG 24 hr tablet Take 0.5 tablets (12.5 mg total) by mouth daily. 45 tablet 3   Multiple Vitamin (MULTIVITAMIN) capsule Take 1  capsule by mouth daily.     Omega-3 Fatty Acids (FISH OIL) 1000 MG CAPS Take 1,000 mg by mouth 2 (two) times daily.     omeprazole (PRILOSEC) 20 MG capsule Take 1 capsule by mouth once daily 90 capsule 0   rosuvastatin (CRESTOR) 40 MG tablet Take 1 tablet (40 mg total) by mouth daily. 90 tablet 2   sacubitril-valsartan (ENTRESTO) 24-26 MG Take 1 tablet by mouth 2 (two) times daily. 30 tablet 0   Vitamin D, Cholecalciferol, 25 MCG (1000 UT) CAPS Take 1,000 Units by mouth daily.     spironolactone (ALDACTONE) 25 MG tablet Take 0.5 tablets (12.5 mg total) by mouth daily. 45 tablet 3   No current facility-administered medications on file prior to visit.   Past Medical History:  Diagnosis Date   Abnormal nuclear stress test 08/08/2020   Abnormal nuclear stress test 08/08/2020   Anxiety    Closed fracture of distal end of left radius 08/16/2020   Depressed left ventricular ejection fraction    Depression    Diabetes mellitus without complication ([place])    Dilated cardiomyopathy ([place]) 08/08/2020   Encounter for Medicare annual wellness exam 10/19/2019   Gastroesophageal reflux disease 10/19/2019   GERD (gastroesophageal reflux disease)    Hyperlipidemia    Hyperlipidemia associated with type 2 diabetes mellitus ([place]) 10/19/2019   LBBB (left bundle branch block) 08/17/2020   Mitral valve prolapse    Nonischemic cardiomyopathy ([place]) 08/17/2020  Osteopenia 10/19/2019   Sinusitis 08/16/2019   Past Surgical History:  Procedure Laterality Date   BREAST LUMPECTOMY Left 2010   LEFT HEART CATH AND CORONARY ANGIOGRAPHY N/A 08/08/2020   Procedure: LEFT HEART CATH AND CORONARY ANGIOGRAPHY;  Surgeon: Leonie Man, MD;  Location: [facility];  Service: Cardiovascular;  Laterality: N/A;    Family History  Problem Relation Age of Onset   Osteoporosis Mother    Coronary artery disease Father    Diabetes Sister    Breast cancer Daughter    Diabetes Maternal Grandfather    Social History    Socioeconomic History   Marital status: Married    Spouse name: [name]   Number of children: 2   Years of education: Not on file   Highest education level: Master's degree (e.g., MA, MS, MEng, MEd, MSW, MBA)  Occupational History   Occupation: Retired  Tobacco Use   Smoking status: Never   Smokeless tobacco: Never  Vaping Use   Vaping Use: Never used  Substance and Sexual Activity   Alcohol use: Yes    Alcohol/week: 2.0 standard drinks    Types: 2 Glasses of wine per week    Comment: occasional (white wine)   Drug use: Never   Sexual activity: Not Currently  Other Topics Concern   Not on file  Social History Narrative   Son lives in [place], Daughter lives in [place]   Patient 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 mother and father as well as two aunts   Social Determinants of Health   Financial Resource Strain: Not on file  Food Insecurity: No Food Insecurity   Worried About Charity fundraiser in the Last Year: Never true   Arboriculturist in the Last Year: Never true  Transportation Needs: No Transportation Needs   Lack of Transportation (Medical): No   Lack of Transportation (Non-Medical): No  Physical Activity: Sufficiently Active   Days of Exercise per Week: 6 days   Minutes of Exercise per Session: 30 min  Stress: Not on file  Social Connections: Not on file    Review of Systems  Constitutional:  Positive for fatigue and unexpected weight change. Negative for appetite change and fever.  HENT:  Negative for congestion, ear pain, sinus pressure and sore throat.   Eyes:  Negative for pain.  Respiratory:  Negative for cough, chest tightness, shortness of breath and wheezing.   Cardiovascular:  Negative for chest pain and palpitations.  Gastrointestinal:  Negative for abdominal pain, constipation, diarrhea, nausea and vomiting.  Genitourinary:  Negative for dysuria and hematuria.  Musculoskeletal:  Negative for arthralgias, back pain, joint swelling and myalgias.        Cyst on rt foot. Painful. Would like referral to podiatry  Skin:  Negative for rash.  Neurological:  Negative for dizziness, weakness and headaches.  Psychiatric/Behavioral:  Negative for dysphoric mood. The patient is not nervous/anxious.     Objective:  BP (!) 92/58 (BP Location: Right Arm, Patient Position: Sitting)   Pulse (!) 58   Temp 97.9 F (36.6 C) (Temporal)   Resp 16   Ht 5\' 7"  (1.702 m)   Wt 127 lb (57.6 kg)   BMI 19.89 kg/m   BP/Weight 12/25/2020 0/35/0093 09/18/8297  Systolic BP 92 98 371  Diastolic BP 58 58 60  Wt. (Lbs) 127 127.6 129.6  BMI 19.89 19.98 20.3    Physical Exam Vitals reviewed.  Constitutional:      Appearance: Normal appearance. She is  normal weight.  Neck:     Vascular: No carotid bruit.  Cardiovascular:     Rate and Rhythm: Normal rate and regular rhythm.     Pulses: Normal pulses.     Heart sounds: Normal heart sounds.  Pulmonary:     Effort: Pulmonary effort is normal. No respiratory distress.     Breath sounds: Normal breath sounds.  Abdominal:     General: Abdomen is flat. Bowel sounds are normal.     Palpations: Abdomen is soft.     Tenderness: There is no abdominal tenderness.  Musculoskeletal:        General: Tenderness (rt medial ankle nodule < 1 cm in diameter. tender. concerning for ganglion.) present.     Right lower leg: No edema.     Left lower leg: No edema.  Neurological:     Mental Status: She is alert and oriented to person, place, and time.  Psychiatric:        Mood and Affect: Mood normal.        Behavior: Behavior normal.    Diabetic Foot Exam - Simple   No data filed      Lab Results  Component Value Date   WBC 5.2 09/21/2020   HGB 12.1 09/21/2020   HCT 37.0 09/21/2020   PLT 289 09/21/2020   GLUCOSE 100 (H) 11/27/2020   CHOL 175 09/21/2020   TRIG 60 09/21/2020   HDL 61 09/21/2020   LDLCALC 102 (H) 09/21/2020   ALT 16 09/21/2020   AST 21 09/21/2020   NA 138 11/27/2020   K 4.7 11/27/2020   CL 103  11/27/2020   CREATININE 0.72 11/27/2020   BUN 12 11/27/2020   CO2 22 11/27/2020   TSH 2.150 06/01/2020   HGBA1C 6.0 (H) 09/21/2020      Assessment & Plan:   Problem List Items Addressed This Visit       Cardiovascular and Mediastinum   Nonischemic cardiomyopathy ([place])    Management per specialist - Dr. Harriet Masson. Continue current medications including entresto, toprol xl, and spironolactone.       Relevant Orders   TSH   Chronic systolic congestive heart failure ([place])    Management per specialist - Dr. Harriet Masson. Continue current medications including entresto, toprol xl, and spironolactone.      Relevant Orders   CBC with Differential/Platelet   Comprehensive metabolic panel     Digestive   Gastroesophageal reflux disease - Primary    The current medical regimen is effective;  continue present plan and medications. Continue omeprazole 20 mg once daily        Endocrine   Hyperlipidemia associated with type 2 diabetes mellitus ([place]) (Chronic)    At goal.  Patient's chart lists her as having diabetes.  I have reviewed her old EMR system and I can only find prediabetes.  If diabetic patient needs urine microalbumin. I will have my nurses call to clarify.        Other   Hyperlipidemia    The current medical regimen is effective;  continue present plan and medications. Continue zetia and crestor.      Relevant Orders   CBC with Differential/Platelet   Comprehensive metabolic panel   Lipid panel   Prediabetes    Check a1c.       Relevant Orders   Hemoglobin A1c   Depression, major, recurrent, mild ([place])    At goal.  Recommend continue prozac.  Rx for lorazepam refilled for anxiety and occasional use for sleep.  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      Relevant Medications   LORazepam (ATIVAN) 0.5 MG tablet  .  Meds ordered this encounter  Medications   LORazepam (ATIVAN) 0.5 MG tablet     Sig: Take 1 tablet (0.5 mg total) by mouth at bedtime as needed for anxiety.    Dispense:  30 tablet    Refill:  1    Orders Placed This Encounter  Procedures   CBC with Differential/Platelet   Comprehensive metabolic panel   Hemoglobin A1c   Lipid panel   TSH     Follow-up: Return in about 3 months (around 03/27/2021) for chronic fasting.  An After Visit Summary was printed and given to the patient.  Total time spent on today's visit was greater than 30 minutes, including both face-to-face time and nonface-to-face time personally spent on review of chart (labs and imaging), discussing labs and goals, discussing further work-up, treatment options, referrals to specialist if needed, reviewing outside records of pertinent, answering patient's questions, and coordinating care.   I,Lauren M Auman,acting as a scribe for [name], MD.,have documented all relevant documentation on the behalf of [name], MD,as directed by  [name], MD while in the presence of [name], MD.    [name], MD [place] [phone]

## 2020-12-25 NOTE — Assessment & Plan Note (Signed)
The current medical regimen is effective;  continue present plan and medications. Continue zetia and crestor.

## 2020-12-25 NOTE — Assessment & Plan Note (Signed)
Suspected. Recommend refer to podiatry

## 2020-12-25 NOTE — Assessment & Plan Note (Signed)
At goal.  Recommend continue prozac.  Rx for lorazepam refilled for anxiety and occasional use for sleep.  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

## 2020-12-25 NOTE — Assessment & Plan Note (Addendum)
>>  ASSESSMENT AND PLAN FOR HYPERLIPIDEMIA ASSOCIATED WITH TYPE 2 DIABETES MELLITUS ([place]) WRITTEN ON 12/25/2020 10:01 PM BY Tyna Huertas, MD  At goal.  Patient's chart lists her as having diabetes.  I have reviewed her old EMR system and I can only find prediabetes.  If diabetic patient needs urine microalbumin. I will have my nurses call to clarify.  >>ASSESSMENT AND PLAN FOR PREDIABETES WRITTEN ON 12/25/2020 10:04 PM BY Genesi Stefanko, MD  Check a1c.

## 2020-12-26 LAB — CBC WITH DIFFERENTIAL/PLATELET
Basophils Absolute: 0.1 10*3/uL (ref 0.0–0.2)
Basos: 1 %
EOS (ABSOLUTE): 0.1 10*3/uL (ref 0.0–0.4)
Eos: 3 %
Hematocrit: 38.7 % (ref 34.0–46.6)
Hemoglobin: 12.1 g/dL (ref 11.1–15.9)
Immature Grans (Abs): 0 10*3/uL (ref 0.0–0.1)
Immature Granulocytes: 0 %
Lymphocytes Absolute: 2.2 10*3/uL (ref 0.7–3.1)
Lymphs: 39 %
MCH: 30.8 pg (ref 26.6–33.0)
MCHC: 31.3 g/dL — ABNORMAL LOW (ref 31.5–35.7)
MCV: 99 fL — ABNORMAL HIGH (ref 79–97)
Monocytes Absolute: 0.4 10*3/uL (ref 0.1–0.9)
Monocytes: 6 %
Neutrophils Absolute: 2.9 10*3/uL (ref 1.4–7.0)
Neutrophils: 51 %
Platelets: 274 10*3/uL (ref 150–450)
RBC: 3.93 x10E6/uL (ref 3.77–5.28)
RDW: 11.9 % (ref 11.7–15.4)
WBC: 5.7 10*3/uL (ref 3.4–10.8)

## 2020-12-26 LAB — COMPREHENSIVE METABOLIC PANEL
ALT: 32 IU/L (ref 0–32)
AST: 32 IU/L (ref 0–40)
Albumin/Globulin Ratio: 2.7 — ABNORMAL HIGH (ref 1.2–2.2)
Albumin: 4.6 g/dL (ref 3.7–4.7)
Alkaline Phosphatase: 49 IU/L (ref 44–121)
BUN/Creatinine Ratio: 17 (ref 12–28)
BUN: 11 mg/dL (ref 8–27)
Bilirubin Total: 0.8 mg/dL (ref 0.0–1.2)
CO2: 24 mmol/L (ref 20–29)
Calcium: 10.6 mg/dL — ABNORMAL HIGH (ref 8.7–10.3)
Chloride: 103 mmol/L (ref 96–106)
Creatinine, Ser: 0.64 mg/dL (ref 0.57–1.00)
Globulin, Total: 1.7 g/dL (ref 1.5–4.5)
Glucose: 97 mg/dL (ref 70–99)
Potassium: 4.8 mmol/L (ref 3.5–5.2)
Sodium: 142 mmol/L (ref 134–144)
Total Protein: 6.3 g/dL (ref 6.0–8.5)
eGFR: 94 mL/min/{1.73_m2} (ref >59)

## 2020-12-26 LAB — HEMOGLOBIN A1C
Est. average glucose Bld gHb Est-mCnc: 117 mg/dL
Hgb A1c MFr Bld: 5.7 % — ABNORMAL HIGH (ref 4.8–5.6)

## 2020-12-26 LAB — LIPID PANEL
Chol/HDL Ratio: 1.8 ratio (ref 0.0–4.4)
Cholesterol, Total: 133 mg/dL (ref 100–199)
HDL: 73 mg/dL (ref >39)
LDL Chol Calc (NIH): 45 mg/dL (ref 0–99)
Triglycerides: 78 mg/dL (ref 0–149)
VLDL Cholesterol Cal: 15 mg/dL (ref 5–40)

## 2020-12-26 LAB — TSH: TSH: 2.49 u[IU]/mL (ref 0.450–4.500)

## 2021-01-02 ENCOUNTER — Other Ambulatory Visit: Payer: Self-pay | Admitting: Family Medicine

## 2021-01-08 DIAGNOSIS — M8580 Other specified disorders of bone density and structure, unspecified site: Secondary | ICD-10-CM | POA: Diagnosis not present

## 2021-01-10 ENCOUNTER — Ambulatory Visit (INDEPENDENT_AMBULATORY_CARE_PROVIDER_SITE_OTHER): Payer: HMO | Admitting: Cardiology

## 2021-01-10 ENCOUNTER — Other Ambulatory Visit: Payer: Self-pay

## 2021-01-10 ENCOUNTER — Encounter: Payer: Self-pay | Admitting: Cardiology

## 2021-01-10 VITALS — BP 106/58 | HR 61 | Ht 67.0 in | Wt 131.0 lb

## 2021-01-10 DIAGNOSIS — I428 Other cardiomyopathies: Secondary | ICD-10-CM

## 2021-01-10 DIAGNOSIS — Z01818 Encounter for other preprocedural examination: Secondary | ICD-10-CM | POA: Diagnosis not present

## 2021-01-10 DIAGNOSIS — Z01812 Encounter for preprocedural laboratory examination: Secondary | ICD-10-CM

## 2021-01-10 NOTE — H&P (View-Only) (Signed)
Electrophysiology Office Note   Date:  01/10/2021   ID:  Audrey Ruiz, DOB June 10, 1950, MRN [id number]  PCP:  [name], MD  Cardiologist:  Tobb Primary Electrophysiologist:  Jehan Ranganathan Meredith Leeds, MD    Chief Complaint: CHF   History of Present Illness: Audrey Ruiz is a 70 y.o. female who is being seen today for the evaluation of CHF at the request of Tobb, Kardie, DO. Presenting today for electrophysiology evaluation.  She has a history significant for hyperlipidemia, type 2 diabetes, nonischemic cardiomyopathy, breast cancer status postchemotherapy.  She did receive Adriamycin 10 years ago for her breast cancer.  She had a left heart catheterization that showed no evidence of coronary artery disease.  She is currently on optimal medical therapy echo shows a persistently reduced ejection fraction.  Her main complaint is fatigue and mild shortness of breath.  Today, she denies symptoms of palpitations, chest pain, shortness of breath, orthopnea, PND, lower extremity edema, claudication, dizziness, presyncope, syncope, bleeding, or neurologic sequela. The patient is tolerating medications without difficulties.    Past Medical History:  Diagnosis Date   Abnormal nuclear stress test 08/08/2020   Abnormal nuclear stress test 08/08/2020   Anxiety    Closed fracture of distal end of left radius 08/16/2020   Depressed left ventricular ejection fraction    Depression    Diabetes mellitus without complication ([place])    Dilated cardiomyopathy ([place]) 08/08/2020   Encounter for Medicare annual wellness exam 10/19/2019   Gastroesophageal reflux disease 10/19/2019   GERD (gastroesophageal reflux disease)    Hyperlipidemia    Hyperlipidemia associated with type 2 diabetes mellitus ([place]) 10/19/2019   LBBB (left bundle branch block) 08/17/2020   Mitral valve prolapse    Nonischemic cardiomyopathy ([place]) 08/17/2020   Osteopenia 10/19/2019   Sinusitis 08/16/2019   Past  Surgical History:  Procedure Laterality Date   BREAST LUMPECTOMY Left 2010   LEFT HEART CATH AND CORONARY ANGIOGRAPHY N/A 08/08/2020   Procedure: LEFT HEART CATH AND CORONARY ANGIOGRAPHY;  Surgeon: Leonie Man, MD;  Location: [facility];  Service: Cardiovascular;  Laterality: N/A;     Current Outpatient Medications  Medication Sig Dispense Refill   cetirizine (ZYRTEC) 10 MG tablet Take 10 mg by mouth daily.     ezetimibe (ZETIA) 10 MG tablet Take 1 tablet by mouth once daily 90 tablet 0   FLUoxetine (PROZAC) 20 MG capsule Take 1 capsule by mouth once daily (Patient taking differently: Every third day. Patient has plans on stopping this medication in about another week.) 90 capsule 3   fluticasone (FLONASE) 50 MCG/ACT nasal spray Use 2 spray(s) in each nostril once daily 48 g 0   Glucosamine-Chondroitin-MSM 1500-1200-500 MG PACK Take 1 tablet by mouth daily.     LORazepam (ATIVAN) 0.5 MG tablet Take 1 tablet (0.5 mg total) by mouth at bedtime as needed for anxiety. 30 tablet 1   Melatonin 10 MG CAPS Take by mouth at bedtime.     metoprolol succinate (TOPROL XL) 25 MG 24 hr tablet Take 0.5 tablets (12.5 mg total) by mouth daily. 45 tablet 3   Multiple Vitamin (MULTIVITAMIN) capsule Take 1 capsule by mouth daily.     Omega-3 Fatty Acids (FISH OIL) 1000 MG CAPS Take 1,000 mg by mouth 2 (two) times daily.     omeprazole (PRILOSEC) 20 MG capsule Take 1 capsule by mouth once daily 90 capsule 0   rosuvastatin (CRESTOR) 40 MG tablet Take 1 tablet (40 mg  total) by mouth daily. 90 tablet 2   sacubitril-valsartan (ENTRESTO) 24-26 MG Take 1 tablet by mouth 2 (two) times daily. 30 tablet 0   Vitamin D, Cholecalciferol, 25 MCG (1000 UT) CAPS Take 1,000 Units by mouth daily.     spironolactone (ALDACTONE) 25 MG tablet Take 0.5 tablets (12.5 mg total) by mouth daily. 45 tablet 3   No current facility-administered medications for this visit.    Allergies:   Patient has no known allergies.    Social History:  The patient  reports that she has never smoked. She has never used smokeless tobacco. She reports current alcohol use of about 2.0 standard drinks per week. She reports that she does not use drugs.   Family History:  The patient's family history includes Breast cancer in her daughter; Coronary artery disease in her father; Diabetes in her maternal grandfather and sister; Osteoporosis in her mother.    ROS:  Please see the history of present illness.   Otherwise, review of systems is positive for none.   All other systems are reviewed and negative.    PHYSICAL EXAM: VS:  BP (!) 106/58    Pulse 61    Ht 5\' 7"  (1.702 m)    Wt 131 lb (59.4 kg)    BMI 20.52 kg/m  , BMI Body mass index is 20.52 kg/m. GEN: Well nourished, well developed, in no acute distress  HEENT: normal  Neck: no JVD, carotid bruits, or masses Cardiac: RRR; no murmurs, rubs, or gallops,no edema  Respiratory:  clear to auscultation bilaterally, normal work of breathing GI: soft, nontender, nondistended, + BS MS: no deformity or atrophy  Skin: warm and dry Neuro:  Strength and sensation are intact Psych: euthymic mood, full affect  EKG:  EKG is ordered today. Personal review of the ekg ordered shows sinus rhythm, left bundle branch block, rate 61  Recent Labs: 08/17/2020: Magnesium 2.0 12/25/2020: ALT 32; BUN 11; Creatinine, Ser 0.64; Hemoglobin 12.1; Platelets 274; Potassium 4.8; Sodium 142; TSH 2.490    Lipid Panel     Component Value Date/Time   CHOL 133 12/25/2020 0914   TRIG 78 12/25/2020 0914   HDL 73 12/25/2020 0914   CHOLHDL 1.8 12/25/2020 0914   LDLCALC 45 12/25/2020 0914     Wt Readings from Last 3 Encounters:  01/10/21 131 lb (59.4 kg)  12/25/20 127 lb (57.6 kg)  11/09/20 127 lb 9.6 oz (57.9 kg)      Other studies Reviewed: Additional studies/ records that were reviewed today include: TTE 09/06/20  Review of the above records today demonstrates:   1. Dysynchronous LV most  likely secondary to IVCD. In view of this, EF  assessment is not optimal. May consider other modalities for better  assessment.. Left ventricular ejection fraction, by estimation, is 30 to  35%. The left ventricle has moderately  decreased function. The left ventricle has no regional wall motion  abnormalities. Left ventricular diastolic parameters are consistent with  Grade I diastolic dysfunction (impaired relaxation).   2. Right ventricular systolic function is normal. The right ventricular  size is normal. There is normal pulmonary artery systolic pressure.   3. The mitral valve is degenerative. No evidence of mitral valve  regurgitation. No evidence of mitral stenosis.   4. The aortic valve is normal in structure. Aortic valve regurgitation is  not visualized. No aortic stenosis is present.   5. The inferior vena cava is normal in size with greater than 50%  respiratory variability, suggesting right  atrial pressure of 3 mmHg.   LHC 08/08/20 Angiographically minimal CAD with short subepicardial portion of the proximal LAD that is relatively diminutive, but widely patent.  Very tortuous vessels. Normal to low LVEDP. Nonischemic Cardiomyopathy: question if this is related to Left Bundle Branch Block with False Positive Stress Test related to Left Bundle Branch Block Wall Motion   ASSESSMENT AND PLAN:  1.  Chronic systolic heart failure due to nonischemic cardiomyopathy: She has a history of breast cancer and reviewed Adriamycin therapy.  Ejection fraction 30 to 35%.  She has a left bundle branch block.  She is currently on optimal medical therapy with Entresto 24/26 mg twice daily, Toprol-XL 12.5 mg daily.  She would benefit from resynchronization therapy and ICD.  Risks and benefits of been discussed which include bleeding, infection, pneumothorax, tamponade.  She understand these risks and has agreed to the procedure.  2.  Hyperlipidemia: Continue statin per primary cardiology.  Case  discussed with primary cardiology  Current medicines are reviewed at length with the patient today.   The patient does not have concerns regarding her medicines.  The following changes were made today:  none  Labs/ tests ordered today include:  Orders Placed This Encounter  Procedures   Basic metabolic panel   CBC   EKG 12-Lead     Disposition:   FU with Roopa Graver 3 months  Signed, Inocente Krach Meredith Leeds, MD  01/10/2021 12:24 PM     [place] [address] [place] [address] [phone] (office) [phone] (fax)

## 2021-01-10 NOTE — Progress Notes (Signed)
Electrophysiology Office Note   Date:  01/10/2021   ID:  Audrey Ruiz, DOB 1950/08/30, MRN [id number]  PCP:  [name], MD  Cardiologist:  Tobb Primary Electrophysiologist:  Jennifer Payes Meredith Leeds, MD    Chief Complaint: CHF   History of Present Illness: Audrey Ruiz is a 70 y.o. female who is being seen today for the evaluation of CHF at the request of Tobb, Kardie, DO. Presenting today for electrophysiology evaluation.  She has a history significant for hyperlipidemia, type 2 diabetes, nonischemic cardiomyopathy, breast cancer status postchemotherapy.  She did receive Adriamycin 10 years ago for her breast cancer.  She had a left heart catheterization that showed no evidence of coronary artery disease.  She is currently on optimal medical therapy echo shows a persistently reduced ejection fraction.  Her main complaint is fatigue and mild shortness of breath.  Today, she denies symptoms of palpitations, chest pain, shortness of breath, orthopnea, PND, lower extremity edema, claudication, dizziness, presyncope, syncope, bleeding, or neurologic sequela. The patient is tolerating medications without difficulties.    Past Medical History:  Diagnosis Date   Abnormal nuclear stress test 08/08/2020   Abnormal nuclear stress test 08/08/2020   Anxiety    Closed fracture of distal end of left radius 08/16/2020   Depressed left ventricular ejection fraction    Depression    Diabetes mellitus without complication ([place])    Dilated cardiomyopathy ([place]) 08/08/2020   Encounter for Medicare annual wellness exam 10/19/2019   Gastroesophageal reflux disease 10/19/2019   GERD (gastroesophageal reflux disease)    Hyperlipidemia    Hyperlipidemia associated with type 2 diabetes mellitus ([place]) 10/19/2019   LBBB (left bundle branch block) 08/17/2020   Mitral valve prolapse    Nonischemic cardiomyopathy ([place]) 08/17/2020   Osteopenia 10/19/2019   Sinusitis 08/16/2019   Past  Surgical History:  Procedure Laterality Date   BREAST LUMPECTOMY Left 2010   LEFT HEART CATH AND CORONARY ANGIOGRAPHY N/A 08/08/2020   Procedure: LEFT HEART CATH AND CORONARY ANGIOGRAPHY;  Surgeon: Leonie Man, MD;  Location: [facility];  Service: Cardiovascular;  Laterality: N/A;     Current Outpatient Medications  Medication Sig Dispense Refill   cetirizine (ZYRTEC) 10 MG tablet Take 10 mg by mouth daily.     ezetimibe (ZETIA) 10 MG tablet Take 1 tablet by mouth once daily 90 tablet 0   FLUoxetine (PROZAC) 20 MG capsule Take 1 capsule by mouth once daily (Patient taking differently: Every third day. Patient has plans on stopping this medication in about another week.) 90 capsule 3   fluticasone (FLONASE) 50 MCG/ACT nasal spray Use 2 spray(s) in each nostril once daily 48 g 0   Glucosamine-Chondroitin-MSM 1500-1200-500 MG PACK Take 1 tablet by mouth daily.     LORazepam (ATIVAN) 0.5 MG tablet Take 1 tablet (0.5 mg total) by mouth at bedtime as needed for anxiety. 30 tablet 1   Melatonin 10 MG CAPS Take by mouth at bedtime.     metoprolol succinate (TOPROL XL) 25 MG 24 hr tablet Take 0.5 tablets (12.5 mg total) by mouth daily. 45 tablet 3   Multiple Vitamin (MULTIVITAMIN) capsule Take 1 capsule by mouth daily.     Omega-3 Fatty Acids (FISH OIL) 1000 MG CAPS Take 1,000 mg by mouth 2 (two) times daily.     omeprazole (PRILOSEC) 20 MG capsule Take 1 capsule by mouth once daily 90 capsule 0   rosuvastatin (CRESTOR) 40 MG tablet Take 1 tablet (40 mg  total) by mouth daily. 90 tablet 2   sacubitril-valsartan (ENTRESTO) 24-26 MG Take 1 tablet by mouth 2 (two) times daily. 30 tablet 0   Vitamin D, Cholecalciferol, 25 MCG (1000 UT) CAPS Take 1,000 Units by mouth daily.     spironolactone (ALDACTONE) 25 MG tablet Take 0.5 tablets (12.5 mg total) by mouth daily. 45 tablet 3   No current facility-administered medications for this visit.    Allergies:   Patient has no known allergies.    Social History:  The patient  reports that she has never smoked. She has never used smokeless tobacco. She reports current alcohol use of about 2.0 standard drinks per week. She reports that she does not use drugs.   Family History:  The patient's family history includes Breast cancer in her daughter; Coronary artery disease in her father; Diabetes in her maternal grandfather and sister; Osteoporosis in her mother.    ROS:  Please see the history of present illness.   Otherwise, review of systems is positive for none.   All other systems are reviewed and negative.    PHYSICAL EXAM: VS:  BP (!) 106/58   Pulse 61   Ht 5\' 7"  (1.702 m)   Wt 131 lb (59.4 kg)   BMI 20.52 kg/m  , BMI Body mass index is 20.52 kg/m. GEN: Well nourished, well developed, in no acute distress  HEENT: normal  Neck: no JVD, carotid bruits, or masses Cardiac: RRR; no murmurs, rubs, or gallops,no edema  Respiratory:  clear to auscultation bilaterally, normal work of breathing GI: soft, nontender, nondistended, + BS MS: no deformity or atrophy  Skin: warm and dry Neuro:  Strength and sensation are intact Psych: euthymic mood, full affect  EKG:  EKG is ordered today. Personal review of the ekg ordered shows sinus rhythm, left bundle branch block, rate 61  Recent Labs: 08/17/2020: Magnesium 2.0 12/25/2020: ALT 32; BUN 11; Creatinine, Ser 0.64; Hemoglobin 12.1; Platelets 274; Potassium 4.8; Sodium 142; TSH 2.490    Lipid Panel     Component Value Date/Time   CHOL 133 12/25/2020 0914   TRIG 78 12/25/2020 0914   HDL 73 12/25/2020 0914   CHOLHDL 1.8 12/25/2020 0914   LDLCALC 45 12/25/2020 0914     Wt Readings from Last 3 Encounters:  01/10/21 131 lb (59.4 kg)  12/25/20 127 lb (57.6 kg)  11/09/20 127 lb 9.6 oz (57.9 kg)      Other studies Reviewed: Additional studies/ records that were reviewed today include: TTE 09/06/20  Review of the above records today demonstrates:   1. Dysynchronous LV most  likely secondary to IVCD. In view of this, EF  assessment is not optimal. May consider other modalities for better  assessment.. Left ventricular ejection fraction, by estimation, is 30 to  35%. The left ventricle has moderately  decreased function. The left ventricle has no regional wall motion  abnormalities. Left ventricular diastolic parameters are consistent with  Grade I diastolic dysfunction (impaired relaxation).   2. Right ventricular systolic function is normal. The right ventricular  size is normal. There is normal pulmonary artery systolic pressure.   3. The mitral valve is degenerative. No evidence of mitral valve  regurgitation. No evidence of mitral stenosis.   4. The aortic valve is normal in structure. Aortic valve regurgitation is  not visualized. No aortic stenosis is present.   5. The inferior vena cava is normal in size with greater than 50%  respiratory variability, suggesting right atrial pressure of 3  mmHg.   LHC 08/08/20 Angiographically minimal CAD with short subepicardial portion of the proximal LAD that is relatively diminutive, but widely patent.  Very tortuous vessels. Normal to low LVEDP. Nonischemic Cardiomyopathy: question if this is related to Left Bundle Branch Block with False Positive Stress Test related to Left Bundle Branch Block Wall Motion   ASSESSMENT AND PLAN:  1.  Chronic systolic heart failure due to nonischemic cardiomyopathy: She has a history of breast cancer and reviewed Adriamycin therapy.  Ejection fraction 30 to 35%.  She has a left bundle branch block.  She is currently on optimal medical therapy with Entresto 24/26 mg twice daily, Toprol-XL 12.5 mg daily.  She would benefit from resynchronization therapy and ICD.  Risks and benefits of been discussed which include bleeding, infection, pneumothorax, tamponade.  She understand these risks and has agreed to the procedure.  2.  Hyperlipidemia: Continue statin per primary cardiology.  Case  discussed with primary cardiology  Current medicines are reviewed at length with the patient today.   The patient does not have concerns regarding her medicines.  The following changes were made today:  none  Labs/ tests ordered today include:  Orders Placed This Encounter  Procedures   Basic metabolic panel   CBC   EKG 12-Lead     Disposition:   FU with Demesha Boorman 3 months  Signed, Maximiano Lott Meredith Leeds, MD  01/10/2021 12:24 PM     [facility] [address] [place] [address] [phone] (office) [phone] (fax)

## 2021-01-10 NOTE — Patient Instructions (Signed)
Medication Instructions:  Your physician recommends that you continue on your current medications as directed. Please refer to the Current Medication list given to you today.  *If you need a refill on your cardiac medications before your next appointment, please call your pharmacy*   Lab Work: None ordered If you have labs (blood work) drawn today and your tests are completely normal, you will receive your results only by: [place] (if you have MyChart) OR A paper copy in the mail If you have any lab test that is abnormal or we need to change your treatment, we will call you to review the results.   Testing/Procedures: CRT or cardiac resynchronization therapy is a treatment used to correct heart failure. When you have heart failure your heart is weakened and doesn't pump as well as it should. This therapy may help reduce symptoms and improve the quality of life.    Please see instruction letter below under "other instructions"     Follow-Up: At [facility], you and your health needs are our priority.  As part of our continuing mission to provide you with exceptional heart care, we have created designated Provider Care Teams.  These Care Teams include your primary Cardiologist (physician) and Advanced Practice Providers (APPs -  Physician Assistants and Nurse Practitioners) who all work together to provide you with the care you need, when you need it.  Your next appointment:   2 week(s) after your procedure  The format for your next appointment:   In Person  Provider:   Device clinic for a wound check  Your physician recommends that you schedule a follow-up appointment in: 3 months after your procedure with Dr Curt Bears.   Thank you for choosing [facility]!!   Trinidad Curet, RN [phone]   Other Instructions    Implantable Device Instructions  You are scheduled for: Biventricular implantable cardiac defibrillator on 02/05/2021 with Dr. Curt Bears.  1.    Pre procedure testing-             A.  LAB WORK--- On 01/10/21  for your pre procedure blood work.  You do NOT need to be fasting.   On the day of your procedure 02/05/2021 you will go to [facility] [phone]) at 7:30 am.  Dennis Bast will go to the main entrance A [employer]) and enter where the [employer] are.  You will check in at ADMITTING.  You may have one support person come in to the hospital with you.  They will be asked to wait in the waiting room.   3.   Do not eat or drink after midnight prior to your procedure.   4.   On the morning of your procedure do NOT take any medication.  5.  The night before your procedure and the morning of your procedure scrub your neck/chest with surgical scrub.  See instruction letter below.    5.  Plan for an overnight stay, but you may be discharged home after your procedure. If you use your phone frequently bring your phone charger, in case you have to stay.  If you are discharged after your procedure you will need someone to drive you home and be with your for 24 hours after your procedure.   6.  You will follow up with the [place] clinic 10-14 days after your procedure. You will follow up with Dr. Curt Bears 91 days after your procedure.  These appointments will be made for you.  7. FYI: For your safety, and to allow [place] to monitor your vital signs accurately during the surgery/procedure we request that if you have artificial nails, gel coating, SNS etc. Please have those removed prior to your surgery/procedure. Not having the nail coverings /polish removed may result in cancellation or delay of your surgery/procedure.   * If you have ANY questions after you get home, please call the office [phone] and ask for Jamoni Broadfoot RN or send a MyChart message.    [facility] - Preparing For Surgery  Before surgery, you can play an important role. Because skin is not sterile, your skin needs to be as free of germs as possible.  You can reduce the number of germs on your skin by washing with CHG (chlorahexidine gluconate) Soap before surgery.  CHG is an antiseptic cleaner which kills germs and bonds with the skin to continue killing germs even after washing.   Please do not use if you have an allergy to CHG or antibacterial soaps.  If your skin becomes reddened/irritated stop using the CHG.   Do not shave (including legs and underarms) for at least 48 hours prior to first CHG shower.  It is OK to shave your face.  Please follow these instructions carefully:  1.  Shower the night before surgery and the morning of surgery with CHG.  2.  If you choose to wash your hair, wash your hair first as usual with your normal shampoo.  3.  After you shampoo, rinse your hair and body thoroughly to remove the shampoo.  4.  Use CHG as you would any other liquid soap.  You can apply CHG directly to the skin and wash gently with a clean washcloth. 5.  Apply the CHG Soap to your body ONLY FROM THE NECK DOWN.  Do not use on open wounds or open sores.  Avoid contact with your eyes, ears, mouth and genitals (private parts).  Wash genitals (private parts) with your normal soap.  6.  Wash thoroughly, paying special attention to the area where your surgery will be performed.  7.  Thoroughly rinse your body with warm water from the neck down.   8.  DO NOT shower/wash with your normal soap after using and rinsing off the CHG soap.  9.  Pat yourself dry with a clean towel.           10.  Wear clean pajamas.           11.  Place clean sheets on your bed the night of your first shower and do not sleep with pets.  Day of Surgery: Do not apply any deodorants/lotions.  Please wear clean clothes to the hospital/surgery center.   Cardioverter Defibrillator Implantation An implantable cardioverter defibrillator (ICD) is a device that identifies and corrects abnormal heart rhythms. Cardioverter defibrillator implantation is a surgery to place an ICD  under the skin in the chest or abdomen. An ICD has a battery, a small computer (pulse generator), and wires (leads) that go into the heart. The ICD detects and corrects two types of dangerous irregular heart rhythms (arrhythmias): A rapid heart rhythm in the lower chambers of the heart (ventricles). This is called ventricular tachycardia. The ventricles contracting in an uncoordinated way. This is called ventricular fibrillation. There are different types of ICDs, and the electrical signals from the ICD can be programmed differently based on the condition being treated. The electrical signals from the ICD can be low-energy pulses, high-energy shocks, or a combination  of the two. The low-energy pulses are generally used to restore the heartbeat to normal when it is either too slow (bradycardia) or too fast. These pulses are painless. The high-energy shocks are used to treat abnormal rhythms such as ventricular tachycardia or ventricular fibrillation. This shock may feel like a strong jolt in the chest. Your health care provider may recommend an ICD if you have: Had a ventricular arrhythmia in the past. A damaged heart because of a disease or heart condition. A weakened heart muscle from a heart attack or cardiac arrest. A congenital heart defect. Long QT syndrome, which is a disorder of the heart's electrical system. Brugada syndrome, which is a condition that causes a disruption of the heart's normal rhythm. Tell a health care provider about: Any allergies you have. All medicines you are taking, including vitamins, herbs, eye drops, creams, and over-the-counter medicines. Any problems you or family members have had with anesthetic medicines. Any blood disorders you have. Any surgeries you have had. Any medical conditions you have. Whether you are pregnant or may be pregnant. What are the risks? Generally, this is a safe procedure. However, problems may occur,  including: Infection. Bleeding. Allergic reactions to medicines used during the procedure. Blood clots. Swelling or bruising. Damage to nearby structures or organs, such as nerves, lungs, blood vessels, or the heart where the ICD leads or pulse generator is implanted. What happens before the procedure? Staying hydrated Follow instructions from your health care provider about hydration, which may include: Up to 2 hours before the procedure - you may continue to drink clear liquids, such as water, clear fruit juice, black coffee, and plain tea.  Eating and drinking restrictions Follow instructions from your health care provider about eating and drinking, which may include: 8 hours before the procedure - stop eating heavy meals or foods, such as meat, fried foods, or fatty foods. 6 hours before the procedure - stop eating light meals or foods, such as toast or cereal. 6 hours before the procedure - stop drinking milk or drinks that contain milk. 2 hours before the procedure - stop drinking clear liquids. Medicines Ask your health care provider about: Changing or stopping your regular medicines. This is especially important if you are taking diabetes medicines or blood thinners. Taking medicines such as aspirin and ibuprofen. These medicines can thin your blood. Do not take these medicines unless your health care provider tells you to take them. Taking over-the-counter medicines, vitamins, herbs, and supplements. Tests You may have an exam or testing. These may include: Blood tests. A test to check the electrical signals in your heart (electrocardiogram, ECG). Imaging tests, such as a chest X-ray. Echocardiogram. This is an ultrasound of your heart to evaluate your heart structures and function. An event monitor or Holter monitor to wear at home. General instructions Do not use any products that contain nicotine or tobacco for at least 4 weeks before the procedure. These products include  cigarettes, chewing tobacco, and vaping devices, such as e-cigarettes. If you need help quitting, ask your health care provider. Ask your health care provider: How your procedure site will be marked. What steps will be taken to help prevent infection. These may include: Removing hair at the surgery site. Washing skin with a germ-killing soap. Taking antibiotic medicine. You may be asked to shower with a germ-killing soap. Plan to have a responsible adult take you home from the hospital or clinic. What happens during the procedure?  Small monitors will be put on  your body. They will be used to check your heart rate, blood pressure, and oxygen level. A pair of sticky pads (defibrillator pads) may be placed on your back and chest. These pads are able to pace your heart as needed during the procedure. An IV will be inserted into one of your veins. You will be given one or more of the following: A medicine to help you relax (sedative). A medicine to numb the area (local anesthetic). A medicine to make you fall asleep(general anesthetic). A small incision will be made to create a deep pocket under the skin of your chest or abdomen. Leads will be guided through a blood vessel into your heart and attached to your heart muscles. Depending on the ICD, the leads may go into one ventricle, or they may go into both ventricles and into an upper chamber of the heart. An X-ray machine (fluoroscope) will be used to help guide the leads. The other end of the leads will be attached to the pulse generator. The pulse generator will be placed into the pocket under the skin. The ICD will be tested, and your health care provider will program the ICD for the condition being treated. The incision will be closed with stitches (sutures), skin glue, adhesive strips, or staples. A bandage (dressing) will be placed over the incision. The procedure may vary among health care providers and hospitals. What happens after the  procedure? Your blood pressure, heart rate, breathing rate, and blood oxygen level will be monitored until you leave the hospital or clinic. Your health care provider will also monitor your ICD to make sure it is working properly. A chest X-ray will be taken to check that the ICD is in the right place. Do not raise the arm on the side of your procedure higher than your shoulder for as long as told by your health care provider. This is usually at least 6 weeks. You may be given an identification card explaining that you have an ICD. You will be given a remote home monitoring device to use with your ICD to allow your device to communicate with your clinic. Summary An implantable cardioverter defibrillator (ICD) is a device that identifies and corrects abnormal heart rhythms. Cardioverter defibrillator implantation is a surgery to place an ICD under the skin in the chest or abdomen. An ICD consists of a battery, a small computer (pulse generator), and wires (leads) that go into the heart. During the procedure, the ICD will be tested, and your health care provider will program the ICD for the condition being treated. After the procedure, a chest X-ray will be taken to check that the ICD is in the right place. This information is not intended to replace advice given to you by your health care provider. Make sure you discuss any questions you have with your health care provider. Document Revised: 08/04/2019 Document Reviewed: 08/04/2019 Elsevier Patient Education  [place].

## 2021-01-15 DIAGNOSIS — M67471 Ganglion, right ankle and foot: Secondary | ICD-10-CM | POA: Diagnosis not present

## 2021-01-15 LAB — BASIC METABOLIC PANEL
BUN/Creatinine Ratio: 24 (ref 12–28)
BUN: 16 mg/dL (ref 8–27)
CO2: 24 mmol/L (ref 20–29)
Calcium: 10.3 mg/dL (ref 8.7–10.3)
Chloride: 104 mmol/L (ref 96–106)
Creatinine, Ser: 0.68 mg/dL (ref 0.57–1.00)
Glucose: 91 mg/dL (ref 70–99)
Potassium: 4.7 mmol/L (ref 3.5–5.2)
Sodium: 139 mmol/L (ref 134–144)
eGFR: 92 mL/min/{1.73_m2} (ref >59)

## 2021-01-15 LAB — CBC

## 2021-01-16 ENCOUNTER — Other Ambulatory Visit: Payer: Self-pay | Admitting: *Deleted

## 2021-01-16 DIAGNOSIS — I428 Other cardiomyopathies: Secondary | ICD-10-CM

## 2021-01-16 DIAGNOSIS — Z01812 Encounter for preprocedural laboratory examination: Secondary | ICD-10-CM

## 2021-01-16 LAB — CBC
Hematocrit: 37.1 % (ref 34.0–46.6)
Hemoglobin: 11.9 g/dL (ref 11.1–15.9)
MCH: 31.3 pg (ref 26.6–33.0)
MCHC: 32.1 g/dL (ref 31.5–35.7)
MCV: 98 fL — ABNORMAL HIGH (ref 79–97)
Platelets: 306 10*3/uL (ref 150–450)
RBC: 3.8 x10E6/uL (ref 3.77–5.28)
RDW: 14 % (ref 11.7–15.4)
WBC: 8 10*3/uL (ref 3.4–10.8)

## 2021-01-25 ENCOUNTER — Other Ambulatory Visit: Payer: Self-pay | Admitting: Family Medicine

## 2021-01-26 NOTE — Telephone Encounter (Signed)
Attempted to call pt, unsuccessful. Left VM for return call to clinic.   Royce Macadamia, [place] 01/26/21 9:46 AM

## 2021-01-26 NOTE — Telephone Encounter (Signed)
Pt returned call. This note was old. Pt previously tried to stop but noticed difference and restarted. She is continuing medication. Currently taking prozac 20 mg once daily requests full supply.   Royce Macadamia, [place] 01/26/21 10:03 AM

## 2021-02-01 ENCOUNTER — Telehealth: Payer: Self-pay

## 2021-02-01 NOTE — Telephone Encounter (Signed)
Patient calling requesting an Attestation that she has heart issues sent to insurance plan. They are requesting verification of heart issues due to new change in cardiac plan. They did try to reach out to cardiac office but was placed on hold then no answer.   Fax #: [phone]  Phone #: [phone]  Audrey Ruiz, [place] 02/01/21 4:40 PM

## 2021-02-02 ENCOUNTER — Telehealth: Payer: PPO

## 2021-02-05 ENCOUNTER — Ambulatory Visit (HOSPITAL_COMMUNITY)
Admission: RE | Disposition: A | Discharge status: Home / Self Care | Payer: Self-pay | Source: Home / Self Care | Attending: Cardiology

## 2021-02-05 ENCOUNTER — Ambulatory Visit (HOSPITAL_COMMUNITY)
Admission: RE | Admit: 2021-02-05 | Discharge: 2021-02-06 | Disposition: A | Discharge status: Home / Self Care | Payer: HMO | Attending: Cardiology | Admitting: Cardiology

## 2021-02-05 ENCOUNTER — Ambulatory Visit (HOSPITAL_COMMUNITY): Payer: HMO

## 2021-02-05 DIAGNOSIS — Z853 Personal history of malignant neoplasm of breast: Secondary | ICD-10-CM | POA: Diagnosis not present

## 2021-02-05 DIAGNOSIS — I447 Left bundle-branch block, unspecified: Secondary | ICD-10-CM | POA: Diagnosis not present

## 2021-02-05 DIAGNOSIS — I428 Other cardiomyopathies: Secondary | ICD-10-CM | POA: Diagnosis not present

## 2021-02-05 DIAGNOSIS — Z79899 Other long term (current) drug therapy: Secondary | ICD-10-CM | POA: Diagnosis not present

## 2021-02-05 DIAGNOSIS — Z95818 Presence of other cardiac implants and grafts: Secondary | ICD-10-CM

## 2021-02-05 DIAGNOSIS — I420 Dilated cardiomyopathy: Secondary | ICD-10-CM

## 2021-02-05 DIAGNOSIS — I509 Heart failure, unspecified: Secondary | ICD-10-CM

## 2021-02-05 DIAGNOSIS — Z950 Presence of cardiac pacemaker: Secondary | ICD-10-CM | POA: Diagnosis not present

## 2021-02-05 DIAGNOSIS — I5022 Chronic systolic (congestive) heart failure: Secondary | ICD-10-CM | POA: Diagnosis not present

## 2021-02-05 DIAGNOSIS — E785 Hyperlipidemia, unspecified: Secondary | ICD-10-CM | POA: Diagnosis not present

## 2021-02-05 HISTORY — PX: BIV ICD INSERTION CRT-D: EP1195

## 2021-02-05 SURGERY — BIV ICD INSERTION CRT-D

## 2021-02-05 MED ORDER — VITAMIN D 25 MCG (1000 UNIT) PO TABS
1000.0000 [IU] | ORAL_TABLET | Freq: Every day | ORAL | Status: DC
  Administered 2021-02-06: 10:00:00 1000 [IU] via ORAL
  Filled 2021-02-05: qty 1

## 2021-02-05 MED ORDER — FLUOXETINE HCL 20 MG PO CAPS
20.0000 mg | ORAL_CAPSULE | Freq: Every day | ORAL | Status: DC
  Administered 2021-02-06: 10:00:00 20 mg via ORAL
  Filled 2021-02-05: qty 1

## 2021-02-05 MED ORDER — CEFAZOLIN SODIUM-DEXTROSE 2-4 GM/100ML-% IV SOLN
INTRAVENOUS | Status: AC
  Filled 2021-02-05: qty 100

## 2021-02-05 MED ORDER — PANTOPRAZOLE SODIUM 40 MG PO TBEC
40.0000 mg | DELAYED_RELEASE_TABLET | Freq: Every day | ORAL | Status: DC
  Administered 2021-02-06: 10:00:00 40 mg via ORAL
  Filled 2021-02-05: qty 1

## 2021-02-05 MED ORDER — IOHEXOL 350 MG/ML SOLN
INTRAVENOUS | Status: DC | PRN
  Administered 2021-02-05: 16:00:00 5 mL

## 2021-02-05 MED ORDER — HEPARIN (PORCINE) IN NACL 1000-0.9 UT/500ML-% IV SOLN
INTRAVENOUS | Status: DC | PRN
  Administered 2021-02-05: 500 mL

## 2021-02-05 MED ORDER — ACETAMINOPHEN 325 MG PO TABS
325.0000 mg | ORAL_TABLET | ORAL | Status: DC | PRN
  Administered 2021-02-05 – 2021-02-06 (×4): 650 mg via ORAL
  Filled 2021-02-05 (×4): qty 2

## 2021-02-05 MED ORDER — CEFAZOLIN SODIUM-DEXTROSE 1-4 GM/50ML-% IV SOLN
1.0000 g | Freq: Four times a day (QID) | INTRAVENOUS | Status: DC
  Administered 2021-02-05: 19:00:00 1 g via INTRAVENOUS
  Filled 2021-02-05 (×3): qty 50

## 2021-02-05 MED ORDER — MIDAZOLAM HCL 5 MG/5ML IJ SOLN
INTRAMUSCULAR | Status: AC
  Filled 2021-02-05: qty 5

## 2021-02-05 MED ORDER — LIDOCAINE HCL 1 % IJ SOLN
INTRAMUSCULAR | Status: AC
  Filled 2021-02-05: qty 60

## 2021-02-05 MED ORDER — ROSUVASTATIN CALCIUM 20 MG PO TABS
40.0000 mg | ORAL_TABLET | Freq: Every day | ORAL | Status: DC
  Administered 2021-02-06: 10:00:00 40 mg via ORAL
  Filled 2021-02-05: qty 2

## 2021-02-05 MED ORDER — SODIUM CHLORIDE 0.9 % IV SOLN
80.0000 mg | INTRAVENOUS | Status: AC
  Administered 2021-02-05: 16:00:00 80 mg

## 2021-02-05 MED ORDER — CEFAZOLIN SODIUM-DEXTROSE 2-4 GM/100ML-% IV SOLN
2.0000 g | INTRAVENOUS | Status: AC
  Administered 2021-02-05: 14:00:00 2 g via INTRAVENOUS

## 2021-02-05 MED ORDER — FLUTICASONE PROPIONATE 50 MCG/ACT NA SUSP
2.0000 | Freq: Every day | NASAL | Status: DC
  Filled 2021-02-05: qty 16

## 2021-02-05 MED ORDER — MIDAZOLAM HCL 5 MG/5ML IJ SOLN
INTRAMUSCULAR | Status: DC | PRN
  Administered 2021-02-05 (×5): 1 mg via INTRAVENOUS

## 2021-02-05 MED ORDER — MULTIVITAMINS PO CAPS: 1.0000 | ORAL_CAPSULE | Freq: Every day | ORAL | Status: DC

## 2021-02-05 MED ORDER — EZETIMIBE 10 MG PO TABS
10.0000 mg | ORAL_TABLET | Freq: Every day | ORAL | Status: DC
  Administered 2021-02-06: 10:00:00 10 mg via ORAL
  Filled 2021-02-05: qty 1

## 2021-02-05 MED ORDER — CEFAZOLIN SODIUM-DEXTROSE 1-4 GM/50ML-% IV SOLN
1.0000 g | Freq: Four times a day (QID) | INTRAVENOUS | Status: DC
  Administered 2021-02-06 (×2): 1 g via INTRAVENOUS
  Filled 2021-02-05 (×3): qty 50

## 2021-02-05 MED ORDER — IBUPROFEN 200 MG PO TABS
200.0000 mg | ORAL_TABLET | Freq: Three times a day (TID) | ORAL | Status: DC | PRN
  Filled 2021-02-05: qty 1

## 2021-02-05 MED ORDER — SODIUM CHLORIDE 0.9 % IV SOLN
INTRAVENOUS | Status: AC
  Filled 2021-02-05: qty 2

## 2021-02-05 MED ORDER — ADULT MULTIVITAMIN W/MINERALS CH
1.0000 | ORAL_TABLET | Freq: Every day | ORAL | Status: DC
Start: 2021-02-06 — End: 2021-02-06
  Administered 2021-02-06: 10:00:00 1 via ORAL
  Filled 2021-02-05: qty 1

## 2021-02-05 MED ORDER — SODIUM CHLORIDE 0.9 % IV SOLN: INTRAVENOUS | Status: DC

## 2021-02-05 MED ORDER — GLUCOSAMINE-CHONDROITIN-MSM 1500-1200-500 MG PO PACK: 1.0000 | PACK | Freq: Every day | ORAL | Status: DC

## 2021-02-05 MED ORDER — FENTANYL CITRATE (PF) 100 MCG/2ML IJ SOLN
INTRAMUSCULAR | Status: AC
  Filled 2021-02-05: qty 2

## 2021-02-05 MED ORDER — LORAZEPAM 0.5 MG PO TABS
0.5000 mg | ORAL_TABLET | Freq: Every evening | ORAL | Status: DC | PRN
  Administered 2021-02-05: 23:00:00 0.5 mg via ORAL
  Filled 2021-02-05: qty 1

## 2021-02-05 MED ORDER — ONDANSETRON HCL 4 MG/2ML IJ SOLN: 4.0000 mg | Freq: Four times a day (QID) | INTRAMUSCULAR | Status: DC | PRN

## 2021-02-05 MED ORDER — FENTANYL CITRATE (PF) 100 MCG/2ML IJ SOLN
INTRAMUSCULAR | Status: DC | PRN
  Administered 2021-02-05: 12.5 ug via INTRAVENOUS
  Administered 2021-02-05 (×2): 25 ug via INTRAVENOUS
  Administered 2021-02-05: 12.5 ug via INTRAVENOUS
  Administered 2021-02-05: 25 ug via INTRAVENOUS

## 2021-02-05 MED ORDER — HEPARIN (PORCINE) IN NACL 1000-0.9 UT/500ML-% IV SOLN
INTRAVENOUS | Status: AC
  Filled 2021-02-05: qty 500

## 2021-02-05 MED ORDER — LIDOCAINE HCL (PF) 1 % IJ SOLN
INTRAMUSCULAR | Status: DC | PRN
  Administered 2021-02-05: 15:00:00 55 mL

## 2021-02-05 MED ORDER — MELATONIN 5 MG PO TABS: 10.0000 mg | ORAL_TABLET | Freq: Every day | ORAL | Status: DC

## 2021-02-05 SURGICAL SUPPLY — 20 items
BALLN COR SINUS VENO 6F 80CM (BALLOONS) ×1
BALLN COR SINUS VENO 6FR 80 (BALLOONS) ×2
BALLOON COR SINUS VENO 6FR 80 (BALLOONS) IMPLANT
CABLE SURGICAL S-101-97-12 (CABLE) ×3 IMPLANT
CATH CPS DIRECT 135 DS2C020 (CATHETERS) ×2 IMPLANT
CATH CPS QUART CN DS2N029-65 (CATHETERS) ×2 IMPLANT
ICD GALLANT HFCRTD CDHFA500Q (ICD Generator) ×2 IMPLANT
KIT ESSENTIALS PG (KITS) ×2 IMPLANT
KIT MICROPUNCTURE NIT STIFF (SHEATH) ×2 IMPLANT
LEAD DURATA 7122Q-65CM (Lead) ×2 IMPLANT
LEAD QUARTET 1458Q-86CM (Lead) ×2 IMPLANT
LEAD TENDRIL MRI 52CM LPA1200M (Lead) ×2 IMPLANT
PAD DEFIB RADIO PHYSIO CONN (PAD) ×3 IMPLANT
SHEATH 7FR PRELUDE SNAP 13 (SHEATH) ×2 IMPLANT
SHEATH 8FR PRELUDE SNAP 13 (SHEATH) ×2 IMPLANT
SHEATH 9.5FR PRELUDE SNAP 13 (SHEATH) ×2 IMPLANT
SHEATH PROBE COVER 6X72 (BAG) ×2 IMPLANT
TRAY PACEMAKER INSERTION (PACKS) ×3 IMPLANT
WIRE ACUITY WHISPER EDS 4648 (WIRE) ×6 IMPLANT
WIRE HI TORQ VERSACORE-J 145CM (WIRE) ×2 IMPLANT

## 2021-02-05 NOTE — Interval H&P Note (Signed)
History and Physical Interval Note:  02/05/2021 10:15 AM  Audrey Ruiz  has presented today for surgery, with the diagnosis of cardiomyopathy.  The various methods of treatment have been discussed with the patient and family. After consideration of risks, benefits and other options for treatment, the patient has consented to  Procedure(s): BIV ICD INSERTION CRT-D (N/A) as a surgical intervention.  The patient's history has been reviewed, patient examined, no change in status, stable for surgery.  I have reviewed the patient's chart and labs.  Questions were answered to the patient's satisfaction.     Jurline Folger Meredith Leeds  ICD Criteria  Current LVEF:33%. Within 12 months prior to implant: Yes   Heart failure history: Yes, Class II  Cardiomyopathy history: Yes, Non-Ischemic Cardiomyopathy.  Atrial Fibrillation/Atrial Flutter: No.  Ventricular tachycardia history: No.  Cardiac arrest history: No.  History of syndromes with risk of sudden death: No.  Previous ICD: No.  Current ICD indication: Primary  PPM indication: No.  Class I or II Bradycardia indication present: No  Beta Blocker therapy for 3 or more months: Yes, prescribed.   Ace Inhibitor/ARB therapy for 3 or more months: Yes, prescribed.    I have seen Audrey Ruiz is a 70 y.o. femalepre-procedural and has been referred by Tobb for consideration of ICD implant for primary prevention of sudden death.  The patient's chart has been reviewed and they meet criteria for ICD implant.  I have had a thorough discussion with the patient reviewing options.  The patient and their family (if available) have had opportunities to ask questions and have them answered. The patient and I have decided together through the [place] Support Tool to implant ICD at this time.  Risks, benefits, alternatives to ICD implantation were discussed in detail with the patient today. The patient  understands  that the risks include but are not limited to bleeding, infection, pneumothorax, perforation, tamponade, vascular damage, renal failure, MI, stroke, death, inappropriate shocks, and lead dislodgement and wishes to proceed.

## 2021-02-05 NOTE — Telephone Encounter (Signed)
Patient called needing someone to call [facility] to attest diagnoses codes.  I called [facility] and they need to confirm that patient has CHF or diabetes.  Diagnoses code given for Chronic systolic congestive heart failure.  Patient called and made aware that this has been taken care of.

## 2021-02-05 NOTE — Progress Notes (Signed)
Report called to April, RN on 2C.

## 2021-02-05 NOTE — Progress Notes (Signed)
CXR report called to [facility], MD who stated that patient will need to be admitted for overnight observation. Awaiting orders. Patient and Audrey Ruiz (sister) notified.

## 2021-02-05 NOTE — Discharge Instructions (Signed)
After Your ICD (Implantable Cardiac Defibrillator)   You have a St. Jude ICD  ACTIVITY Do not lift your arm above shoulder height for 1 week after your procedure. After 7 days, you may progress as below.  You should remove your sling 24 hours after your procedure, unless otherwise instructed by your provider.     Monday February 12, 2021  Tuesday February 13, 2021 Wednesday February 14, 2021 Thursday February 15, 2021   Do not lift, push, pull, or carry anything over 10 pounds with the affected arm until 6 weeks (Monday March 19, 2021 ) after your procedure.   You may drive AFTER your wound check, unless you have been told otherwise by your provider.   Ask your healthcare provider when you can go back to work   INCISION/Dressing If you are on a blood thinner such as Coumadin, Xarelto, Eliquis, Plavix, or Pradaxa please confirm with your provider when this should be resumed.  If large square, outer bandage is left in place, this can be removed after 24 hours from your procedure. Do not remove steri-strips or glue as below.   Monitor your defibrillator site for redness, swelling, and drainage. Call the device clinic at [phone] if you experience these symptoms or fever/chills.  If your incision is sealed with Steri-strips or staples, you may shower 10 days after your procedure or when told by your provider. Do not remove the steri-strips or let the shower hit directly on your site. You may wash around your site with soap and water.    If you were discharged in a sling, please do not wear this during the day more than 48 hours after your surgery unless otherwise instructed. This may increase the risk of stiffness and soreness in your shoulder.   Avoid lotions, ointments, or perfumes over your incision until it is well-healed.  You may use a hot tub or a pool AFTER your wound check appointment if the incision is completely closed.  Your ICD is designed to protect you from life  threatening heart rhythms. Because of this, you may receive a shock.   1 shock with no symptoms:  Call the office during business hours. 1 shock with symptoms (chest pain, chest pressure, dizziness, lightheadedness, shortness of breath, overall feeling unwell):  Call 911. If you experience 2 or more shocks in 24 hours:  Call 911. If you receive a shock, you should not drive for 6 months per the [place] DMV IF you receive appropriate therapy from your ICD.   ICD Alerts:  Some alerts are vibratory and others beep. These are NOT emergencies. Please call our office to let us know. If this occurs at night or on weekends, it can wait until the next business day. Send a remote transmission.  If your device is capable of reading fluid status (for heart failure), you will be offered monthly monitoring to review this with you.   DEVICE MANAGEMENT Remote monitoring is used to monitor your ICD from home. This monitoring is scheduled every 91 days by our office. It allows [place] to keep an eye on the functioning of your device to ensure it is working properly. You will routinely see your Electrophysiologist annually (more often if necessary).   You should receive your ID card for your new device in 4-8 weeks. Keep this card with you at all times once received. Consider wearing a medical alert bracelet or necklace.  Your ICD  may be MRI compatible. This will be discussed at your next  office visit/wound check.  You should avoid contact with strong electric or magnetic fields.   Do not use amateur (ham) radio equipment or electric (arc) welding torches. MP3 player headphones with magnets should not be used. Some devices are safe to use if held at least 12 inches (30 cm) from your defibrillator. These include power tools, lawn mowers, and speakers. If you are unsure if something is safe to use, ask your health care provider.  When using your cell phone, hold it to the ear that is on the opposite side from the  defibrillator. Do not leave your cell phone in a pocket over the defibrillator.  You may safely use electric blankets, heating pads, computers, and microwave ovens.  Call the office right away if: You have chest pain. You feel more than one shock. You feel more short of breath than you have felt before. You feel more light-headed than you have felt before. Your incision starts to open up.  This information is not intended to replace advice given to you by your health care provider. Make sure you discuss any questions you have with your health care provider.

## 2021-02-06 ENCOUNTER — Encounter (HOSPITAL_COMMUNITY): Payer: Self-pay | Admitting: Cardiology

## 2021-02-06 ENCOUNTER — Ambulatory Visit (HOSPITAL_COMMUNITY): Payer: HMO

## 2021-02-06 DIAGNOSIS — I5022 Chronic systolic (congestive) heart failure: Secondary | ICD-10-CM | POA: Diagnosis not present

## 2021-02-06 DIAGNOSIS — I428 Other cardiomyopathies: Secondary | ICD-10-CM | POA: Diagnosis not present

## 2021-02-06 DIAGNOSIS — I5082 Biventricular heart failure: Secondary | ICD-10-CM

## 2021-02-06 DIAGNOSIS — J9811 Atelectasis: Secondary | ICD-10-CM | POA: Diagnosis not present

## 2021-02-06 DIAGNOSIS — J90 Pleural effusion, not elsewhere classified: Secondary | ICD-10-CM | POA: Diagnosis not present

## 2021-02-06 DIAGNOSIS — J948 Other specified pleural conditions: Secondary | ICD-10-CM | POA: Diagnosis not present

## 2021-02-06 MED ORDER — YOU HAVE A PACEMAKER BOOK
Freq: Once | Status: AC
  Filled 2021-02-06: qty 1

## 2021-02-06 MED FILL — Lidocaine HCl Local Inj 1%: INTRAMUSCULAR | Qty: 55 | Status: AC

## 2021-02-06 NOTE — Plan of Care (Signed)

## 2021-02-06 NOTE — Progress Notes (Signed)
Heart Failure Nurse Navigator Progress Note  PCP: [name], MD PCP-Cardiologist: Katha Cabal., MD Admission Diagnosis: Device implant/CHF Admitted from: home with spouse  Presentation:   Audrey Ruiz presented 12/19 for implantation of CRT-d device with Dr. Curt Bears. Pt resting in recliner with feet on the floor on room air. Sister at bedside. Patient interactive with interview process.  Pt states she is not driving after procedure, but has family that can provide transportation. Pt states she drinks 2 glasses of wine every night since 1997. Encouraged cessation. No smoking. No immediate social needs noted.  Explained benefits of Heart & Vascular Transitions of Care Clinic appointment, patient agreeable.    ECHO/ LVEF: 30-35%, G1DD  Clinical Course:  Past Medical History:  Diagnosis Date   Abnormal nuclear stress test 08/08/2020   Abnormal nuclear stress test 08/08/2020   Anxiety    Closed fracture of distal end of left radius 08/16/2020   Depressed left ventricular ejection fraction    Depression    Diabetes mellitus without complication ([place])    Dilated cardiomyopathy ([place]) 08/08/2020   Encounter for Medicare annual wellness exam 10/19/2019   Gastroesophageal reflux disease 10/19/2019   GERD (gastroesophageal reflux disease)    Hyperlipidemia    Hyperlipidemia associated with type 2 diabetes mellitus ([place]) 10/19/2019   LBBB (left bundle branch block) 08/17/2020   Mitral valve prolapse    Nonischemic cardiomyopathy ([place]) 08/17/2020   Osteopenia 10/19/2019   Sinusitis 08/16/2019     Social History   Socioeconomic History   Marital status: Married    Spouse name: [name]   Number of children: 2   Years of education: Not on file   Highest education level: Master's degree (e.g., MA, MS, MEng, MEd, MSW, MBA)  Occupational History   Occupation: Retired  Tobacco Use   Smoking status: Never   Smokeless tobacco: Never  Vaping Use   Vaping Use: Never used  Substance  and Sexual Activity   Alcohol use: Yes    Alcohol/week: 2.0 standard drinks    Types: 2 Glasses of wine per week    Comment: occasional (white wine)   Drug use: Never   Sexual activity: Not Currently  Other Topics Concern   Not on file  Social History Narrative   Son lives in [place], Daughter lives in [place]   Patient 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 mother and father as well as two aunts   Social Determinants of Health   Financial Resource Strain: Not on file  Food Insecurity: No Food Insecurity   Worried About Charity fundraiser in the Last Year: Never true   Arboriculturist in the Last Year: Never true  Transportation Needs: No Transportation Needs   Lack of Transportation (Medical): No   Lack of Transportation (Non-Medical): No  Physical Activity: Sufficiently Active   Days of Exercise per Week: 6 days   Minutes of Exercise per Session: 30 min  Stress: Not on file  Social Connections: Not on file    High Risk Criteria for Readmission and/or Poor Patient Outcomes: Heart failure hospital admissions (last 6 months): 2  No Show rate: 0% Difficult social situation: no Demonstrates medication adherence: yes Primary Language: English Literacy level: able to read/write and comprehend.  Education Assessment and Provision:  Detailed education and instructions provided on heart failure disease management including the following:  Signs and symptoms of Heart Failure When to call the physician Importance of daily weights Low sodium diet Fluid restriction Medication management Anticipated future  follow-up appointments  Patient education given on each of the above topics.  Patient acknowledges understanding via teach back method and acceptance of all instructions.  Education Materials:  "Living Better With Heart Failure" Booklet, HF zone tool, & Daily Weight Tracker Tool.  Patient has scale at home: yes Patient has pill box at home: yes   Barriers of Care:   -alcohol  dependence  Considerations/Referrals:   Referral made to Heart Failure Pharmacist Stewardship: yes, to see at [place]  Referral made to Heart Failure CSW/NCM TOC: no Referral made to Heart & Vascular TOC clinic: yes, 1/5 @ 10AM  Items for Follow-up on DC/TOC: -optimize -alcohol cessation -med cost   Pricilla Holm, MSN, RN Heart Failure Nurse Navigator [phone]

## 2021-02-06 NOTE — Plan of Care (Signed)

## 2021-02-06 NOTE — Progress Notes (Addendum)
Progress Note  Patient Name: Audrey Ruiz Date of Encounter: 02/06/2021  [facility] Cardiologist: Godfrey Pick Tobb, DO   Subjective   No CP, feels a little breathless, not overtly SOB, "like I can get a good deep breath in"  Inpatient Medications    Scheduled Meds:  cholecalciferol  1,000 Units Oral Daily   ezetimibe  10 mg Oral Daily   FLUoxetine  20 mg Oral Daily   fluticasone  2 spray Each Nare Daily   melatonin  10 mg Oral QHS   multivitamin with minerals  1 tablet Oral Daily   pantoprazole  40 mg Oral Daily   rosuvastatin  40 mg Oral Daily   Continuous Infusions:   ceFAZolin (ANCEF) IV 1 g (02/06/21 0116)   PRN Meds: acetaminophen, ibuprofen, LORazepam, ondansetron (ZOFRAN) IV   Vital Signs    Vitals:   02/05/21 1908 02/05/21 1938 02/05/21 2115 02/05/21 2250  BP: (!) 118/59 (!) 116/47 127/63 117/65  Pulse: 72 69 66 60  Resp: 17 17 12 14   Temp:   97.6 F (36.4 C) 97.6 F (36.4 C)  TempSrc:   Oral Oral  SpO2: 97% 96% 99% 99%  Weight:      Height:        Intake/Output Summary (Last 24 hours) at 02/06/2021 0813 Last data filed at 02/06/2021 0400 Gross per 24 hour  Intake 50 ml  Output --  Net 50 ml   Last 3 Weights 02/05/2021 01/10/2021 12/25/2020  Weight (lbs) 125 lb 131 lb 127 lb  Weight (kg) 56.7 kg 59.421 kg 57.607 kg      Telemetry    SR/Vpaced - Personally Reviewed  ECG    SR/VP - Personally Reviewed  Physical Exam   GEN: No acute distress.   Neck: No JVD Cardiac: RRR, no murmurs, rubs, or gallops.  Respiratory: CTA b/l, on overt absent BS by my exam. GI: Soft, nontender, non-distended  MS: No edema; No deformity. Neuro:  Nonfocal  Psych: Normal affect   Labs    High Sensitivity Troponin:  No results for input(s): TROPONINIHS in the last 720 hours.   ChemistryNo results for input(s): NA, K, CL, CO2, GLUCOSE, BUN, CREATININE, CALCIUM, MG, PROT, ALBUMIN, AST, ALT, ALKPHOS, BILITOT, GFRNONAA, GFRAA, ANIONGAP in the last  168 hours.  Lipids No results for input(s): CHOL, TRIG, HDL, LABVLDL, LDLCALC, CHOLHDL in the last 168 hours.  HematologyNo results for input(s): WBC, RBC, HGB, HCT, MCV, MCH, MCHC, RDW, PLT in the last 168 hours. Thyroid No results for input(s): TSH, FREET4 in the last 168 hours.  BNPNo results for input(s): BNP, PROBNP in the last 168 hours.  DDimer No results for input(s): DDIMER in the last 168 hours.   Radiology    DG Chest 2 View  Result Date: 02/06/2021 CLINICAL DATA:  Cardiac device in-situ, pacemaker. EXAM: CHEST - 2 VIEW COMPARISON:  02/05/2021. FINDINGS: The heart size and mediastinal contours are stable. There is hyperinflation of the lungs. Mild subsegmental atelectasis is noted in the mid left lung and at the left lung base. Small left pleural effusion is unchanged. There is a small to moderate left-sided pneumothorax measuring approximally 3.1 cm from the left lung apex, not significantly changed from the prior exam. No acute osseous abnormality. A multi lead pacemaker device is seen over the left chest and unchanged from the prior exam. IMPRESSION: Small to moderate left hydropneumothorax, not significantly changed from the prior exam. Atelectasis is noted in the mid left lung and at  the left lung base. Electronically Signed   By: Brett Fairy M.D.   On: 02/06/2021 03:46   DG Chest 2 View  Result Date: 02/05/2021 CLINICAL DATA:  Pacemaker placement EXAM: CHEST - 2 VIEW COMPARISON:  04/10/2018 FINDINGS: Interval left 3 lead subclavian pacemaker defibrillator placement with leads overlying the expected position. Development of a small to moderate left hydropneumothorax with both apical and posterior gaseous components. Small pleural fluid component. Right lung is clear. No pneumothorax or pleural effusion on the right. Cardiac size within normal limits. No mediastinal shift. Pulmonary vascularity is normal. No acute bone abnormality. IMPRESSION: Status post left subclavian pacemaker  placement. Small to moderate left hydropneumothorax. No radiographic evidence of tension physiology. These results Marcy Bogosian be called to the ordering clinician or representative by the Radiologist Assistant, and communication documented in the PACS or [employer]. Electronically Signed   By: Fidela Salisbury M.D.   On: 02/05/2021 20:30    Cardiac Studies   09/06/20: TTE IMPRESSIONS   1. Dysynchronous LV most likely secondary to IVCD. In view of this, EF  assessment is not optimal. May consider other modalities for better  assessment.. Left ventricular ejection fraction, by estimation, is 30 to  35%. The left ventricle has moderately  decreased function. The left ventricle has no regional wall motion  abnormalities. Left ventricular diastolic parameters are consistent with  Grade I diastolic dysfunction (impaired relaxation).   2. Right ventricular systolic function is normal. The right ventricular  size is normal. There is normal pulmonary artery systolic pressure.   3. The mitral valve is degenerative. No evidence of mitral valve  regurgitation. No evidence of mitral stenosis.   4. The aortic valve is normal in structure. Aortic valve regurgitation is  not visualized. No aortic stenosis is present.   5. The inferior vena cava is normal in size with greater than 50%  respiratory variability, suggesting right atrial pressure of 3 mmHg.    08/08/20: [facility] SUMMARY Angiographically minimal CAD with short subepicardial portion of the proximal LAD that is relatively diminutive, but widely patent.  Very tortuous vessels. Normal to low LVEDP. Nonischemic Cardiomyopathy: question if this is related to Left Bundle Branch Block with False Positive Stress Test related to Left Bundle Branch Block Wall Motion.  Patient Profile     70 y.o. female w/PMHx of NICM, chronic CHF, breast cancer w/remote chemo tx with Adriamycin, LBBB came yesterday for CRT-D implant complicated by left pneumothorax and  admitted  Assessment & Plan    CRT-d implant yesterday Post procedure hydropneumothorax Sats are ok on RA VSS She gets a sense of some need to take a deep breath, perhaps a little SOB I have discussed with CCM/Pulmonary, they Tayron Hunnell see her  Device check this AM with stable measurements Site is stable no hematoma  NICM LBBB compensated  For questions or updates, please contact [facility] Please consult [email or web address] for contact info under        Signed, Baldwin [place], PA-C  02/06/2021, 8:13 AM    I have seen and examined this patient with Tommye Standard.  Agree with above, note added to reflect my findings.  Patient feeling well.  A little short of breath.  Feels like she needs to take a deep breath.  Aside from that, no complaints.  GEN: Well nourished, well developed, in no acute distress  HEENT: normal  Neck: no JVD, carotid bruits, or masses Cardiac: RRR; no murmurs, rubs, or gallops,no edema  Respiratory:  clear to auscultation  bilaterally, normal work of breathing GI: soft, nontender, nondistended, + BS MS: no deformity or atrophy  Skin: warm and dry, device site well healed Neuro:  Strength and sensation are intact Psych: euthymic mood, full affect   Chronic systolic heart failure due to nonischemic cardiomyopathy: Status post Saint Jude CRT-D.  Device functioning appropriately. Pneumothorax: Occurred post ICD implant.  Has follow-up arranged with pulmonary medicine.  Jovanni Eckhart M. Armida Vickroy MD 02/06/2021 12:46 PM

## 2021-02-06 NOTE — Discharge Summary (Addendum)
ELECTROPHYSIOLOGY PROCEDURE DISCHARGE SUMMARY    Patient ID: Audrey Ruiz,  MRN: [id number], DOB/AGE: Nov 25, 1950 70 y.o.  Admit date: 02/05/2021 Discharge date: 02/06/2021  Primary Care Physician: [name], MD  Primary Cardiologist: Dr. Harriet Masson Electrophysiologist: Dr. Curt Bears  Primary Discharge Diagnosis:  NICM LBBB S/p CRT-D implant  3. Hydropneumothorax   Secondary Discharge Diagnosis:  Chronic CHF (systolic) compensated Hx of breast cancer  No Known Allergies   Procedures This Admission:  1.  Implantation of a Abbott CRT-D on 02/05/21 by Dr Curt Bears.   DFT's were deferred at time of implant.    Post procedure CXR on 02/05/21 demonstrated L apical ptx and she was admitted for close observation   Brief HPI: Audrey Ruiz is a 70 y.o. female was referred to electrophysiology in the outpatient setting for consideration of ICD implantation.  Past medical history includes above.  The patient has persistent LV dysfunction despite guideline directed therapy.  Risks, benefits, and alternatives to ICD implantation were reviewed with the patient who wished to proceed.   Hospital Course:  The patient was admitted and underwent implantation of a CRT-D, see procedure report for full details.  Initially planned for same day discharge though her post procedure CXR noted L apical ptx, she was asymptomatic and hemodynamically stable, though admitted for close observation,  She was monitored on telemetry overnight which demonstrated SR/ V pacing.  Left chest was without hematoma or ecchymosis.   Follow up CXR early AM of discharge with stable small-mod hydropneumothorax and CCM/Pulmonary medicine conculted.  Her O2 sats on RA were excellent and she was not overtly SOB/she had some discomfort at the device implant site, no CP otherwise. PCCM note reviewed and discussed with Dr. Lake Bells, given stability of her pneumothorax, essentially asymptomatic with good O2  sats, planned for discharge with close follow up in 2 days with them and repeat CXR.  Noting that she had sliding lung throughput by CXR and the only suitable place to aspirate or put a chest tune was near her device pocket and did not want to compromise her device/pocket stability. The device was interrogated last evening and this morning and found to be functioning normally.    Wound care, arm mobility, and restrictions were reviewed with the patient.  The patient feels well, denies any CP, no overt SOB,  with minimal site discomfort, she was examined by pulmonary medicine and given their recommendations Dr. Curt Bears also considered  her stable for discharge to home.   The patient's discharge medications include an ACE/ARB (Entresto) and beta blocker (Metoprolol).   Physical Exam: Vitals:   02/05/21 1938 02/05/21 2115 02/05/21 2250 02/06/21 0835  BP: (!) 116/47 127/63 117/65   Pulse: 69 66 60 81  Resp: 17 12 14 13   Temp:  97.6 F (36.4 C) 97.6 F (36.4 C)   TempSrc:  Oral Oral   SpO2: 96% 99% 99% 100%  Weight:      Height:        Labs:   Lab Results  Component Value Date   WBC 8.0 01/16/2021   HGB 11.9 01/16/2021   HCT 37.1 01/16/2021   MCV 98 (H) 01/16/2021   PLT 306 01/16/2021   No results for input(s): NA, K, CL, CO2, BUN, CREATININE, CALCIUM, PROT, BILITOT, ALKPHOS, ALT, AST, GLUCOSE in the last 168 hours.  Invalid input(s): LABALBU  Discharge Medications:  Allergies as of 02/06/2021   No Known Allergies      Medication List  TAKE these medications    cetirizine 10 MG tablet Commonly known as: ZYRTEC Take 10 mg by mouth daily.   Entresto 24-26 MG Generic drug: sacubitril-valsartan Take 1 tablet by mouth 2 (two) times daily.   ezetimibe 10 MG tablet Commonly known as: ZETIA Take 1 tablet by mouth once daily   Fish Oil 1000 MG Caps Take 1,000 mg by mouth 2 (two) times daily.   FLUoxetine 20 MG capsule Commonly known as: PROZAC Take 1 capsule by  mouth once daily   fluticasone 50 MCG/ACT nasal spray Commonly known as: FLONASE Use 2 spray(s) in each nostril once daily   Glucosamine-Chondroitin-MSM 1500-1200-500 MG Pack Take 1 tablet by mouth daily.   ibuprofen 200 MG tablet Commonly known as: ADVIL Take 200 mg by mouth every 8 (eight) hours as needed for moderate pain.   LORazepam 0.5 MG tablet Commonly known as: ATIVAN Take 1 tablet (0.5 mg total) by mouth at bedtime as needed for anxiety.   Melatonin 10 MG Caps Take 10 mg by mouth at bedtime.   metoprolol succinate 25 MG 24 hr tablet Commonly known as: Toprol XL Take 0.5 tablets (12.5 mg total) by mouth daily.   multivitamin capsule Take 1 capsule by mouth daily.   omeprazole 20 MG capsule Commonly known as: PRILOSEC Take 1 capsule by mouth once daily   rosuvastatin 40 MG tablet Commonly known as: CRESTOR Take 1 tablet (40 mg total) by mouth daily.   spironolactone 25 MG tablet Commonly known as: ALDACTONE Take 12.5 mg by mouth daily.   Vitamin D (Cholecalciferol) 25 MCG (1000 UT) Caps Take 1,000 Units by mouth daily.        Disposition: Home  Discharge Instructions     Diet - low sodium heart healthy   Complete by: As directed    Increase activity slowly   Complete by: As directed        Follow-up Information     Martyn Ehrich, NP Follow up.   Specialty: Pulmonary Disease Why: Your appointment is at 10:30AM Contact information: [address] [place] [address] [phone]                 Duration of Discharge Encounter: Greater than 30 minutes including physician time.  Venetia Night, PA-C 02/06/2021 11:23 AM  I have seen and examined this patient with Tommye Standard.  Agree with above, note added to reflect my findings.  On exam, RRR, no murmurs.  She is now status post [place] CRT-D implanted for chronic systolic heart failure.  Complicated by pneumothorax.  Evaluated by pulmonology who Vora Clover  arrange for follow-up in clinic.Marland Kitchen  Device functioning appropriately.  Chest x-ray and interrogation without issue.  Plan for discharge today with follow-up in device clinic.  Raelle Chambers M. Emit Kuenzel MD 02/06/2021 12:47 PM

## 2021-02-06 NOTE — Consult Note (Signed)
NAME:  Audrey Ruiz, MRN:  [id number], DOB:  11-05-1950, LOS: 0 ADMISSION DATE:  02/05/2021, CONSULTATION DATE:  02/06/21 REFERRING MD:  Curt Bears CHIEF COMPLAINT:  PTX   History of Present Illness:  Audrey Ruiz is a 70 y.o. female who has a PMH as below including NICM, chronic CHF, LBB.  She [facility] to [facility] ED 12/20 for BIV ICD insertion.  This was unfortunately complicated by post procedure pneumothorax.  Follow up CXR in AM 12/20 had no change; therefore, PCCM asked to see in consultation.  She is asymptomatic on room air.  Vitals stable.  Denies any dyspnea, chest pain.  She has never had a PTX in the past.  Denies any smoking history.  CXR reviewed, small to mod left hydropneumothorax.  We assessed pleural space under [place] at bedside and could not identify a safe pocket for either needle aspiration or pigtail catheter placement (sliding lung noted throughout posterior lung fields).   Pertinent  Medical History:  has Encounter for Medicare annual wellness exam; Gastroesophageal reflux disease; Hyperlipidemia associated with type 2 diabetes mellitus ([place]); Osteopenia; Hyperlipidemia; Mitral valve prolapse; Dilated cardiomyopathy ([place]); Nonischemic cardiomyopathy ([facility]); LBBB (left bundle branch block); Closed fracture of distal end of left radius; Prediabetes; Chronic systolic congestive heart failure ([place]); Depression, major, recurrent, mild ([place]); Ganglion cyst of right foot; and CHF (congestive heart failure) ([place]) on their problem list.  Significant Hospital Events: Including procedures, antibiotic start and stop dates in addition to other pertinent events   12/19 ICD implant complicated by PTX, admitted for obs.  Interim History / Subjective:  Comfortable on room air.  Denies dyspnea, chest pain.  Objective:  Blood pressure 117/65, pulse 81, temperature 97.6 F (36.4 C), temperature source Oral, resp. rate 13, height 5\' 7"  (1.702 m), weight 56.7 kg, SpO2 100  %.        Intake/Output Summary (Last 24 hours) at 02/06/2021 0915 Last data filed at 02/06/2021 0400 Gross per 24 hour  Intake 50 ml  Output --  Net 50 ml   Filed Weights   02/05/21 1030  Weight: 56.7 kg    Examination: General: Adult female, resting in recliner, in NAD. Neuro: A&O x 3, no deficits. HEENT: [place]/AT. Sclerae anicteric. EOMI. Cardiovascular: RRR, no M/R/G.  Lungs: Respirations even and unlabored.  CTA bilaterally, No W/R/R. Abdomen: BS x 4, soft, NT/ND.  Musculoskeletal: No gross deformities, no edema.  Skin: Intact, warm, no rashes.  Labs/imaging personally reviewed:  CXR 12/20 > small to mod left HPTX, minimal change from prior evening.   Assessment & Plan:   Post ICD implant PTX - stable on follow up CXR this AM.  We assessed pleural space under [place] at bedside and could not identify a safe pocket for either needle aspiration or pigtail catheter placement (sliding lung noted throughout posterior lung fields).  Clinically, she looks quite good and is asymptomatic.  She is on room air with O2 sats of 100%. - Continue conservative management. - No role for any invasive intervention. - Follow up appointment including follow up CXR arranged for Thursday 12/22 at 10:30AM with Derl Barrow, NP at our office. - Advised pt of concerning symptoms that would warrant ED visit. - Stable for discharge from our standpoint.  Rest per primary team.  Nothing further to add.  PCCM will sign off.  Please call us back if we can be of any further assistance.  Best practice (evaluated daily):  Per primary team.  Labs   CBC:  No results for input(s): WBC, NEUTROABS, HGB, HCT, MCV, PLT in the last 168 hours.  Basic Metabolic Panel: No results for input(s): NA, K, CL, CO2, GLUCOSE, BUN, CREATININE, CALCIUM, MG, PHOS in the last 168 hours. GFR: CrCl cannot be calculated (Patient's most recent lab result is older than the maximum 21 days allowed.). No results for input(s):  PROCALCITON, WBC, LATICACIDVEN in the last 168 hours.  Liver Function Tests: No results for input(s): AST, ALT, ALKPHOS, BILITOT, PROT, ALBUMIN in the last 168 hours. No results for input(s): LIPASE, AMYLASE in the last 168 hours. No results for input(s): AMMONIA in the last 168 hours.  ABG No results found for: PHART, PCO2ART, PO2ART, HCO3, TCO2, ACIDBASEDEF, O2SAT   Coagulation Profile: No results for input(s): INR, PROTIME in the last 168 hours.  Cardiac Enzymes: No results for input(s): CKTOTAL, CKMB, CKMBINDEX, TROPONINI in the last 168 hours.  HbA1C: Hgb A1c MFr Bld  Date/Time Value Ref Range Status  12/25/2020 09:14 AM 5.7 (H) 4.8 - 5.6 % Final    Comment:             Prediabetes: 5.7 - 6.4          Diabetes: >6.4          Glycemic control for adults with diabetes: <7.0   09/21/2020 08:47 AM 6.0 (H) 4.8 - 5.6 % Final    Comment:             Prediabetes: 5.7 - 6.4          Diabetes: >6.4          Glycemic control for adults with diabetes: <7.0     CBG: No results for input(s): GLUCAP in the last 168 hours.  Review of Systems:   All negative; except for those that are bolded, which indicate positives.  Constitutional: weight loss, weight gain, night sweats, fevers, chills, fatigue, weakness.  HEENT: headaches, sore throat, sneezing, nasal congestion, post nasal drip, difficulty swallowing, tooth/dental problems, visual complaints, visual changes, ear aches. Neuro: difficulty with speech, weakness, numbness, ataxia. CV:  chest pain, orthopnea, PND, swelling in lower extremities, dizziness, palpitations, syncope.  Resp: cough, hemoptysis, dyspnea, wheezing. GI: heartburn, indigestion, abdominal pain, nausea, vomiting, diarrhea, constipation, change in bowel habits, loss of appetite, hematemesis, melena, hematochezia.  GU: dysuria, change in color of urine, urgency or frequency, flank pain, hematuria. MSK: joint pain or swelling, decreased range of motion. Psych:  change in mood or affect, depression, anxiety, suicidal ideations, homicidal ideations. Skin: rash, itching, bruising.   Past Medical History:  She,  has a past medical history of Abnormal nuclear stress test (08/08/2020), Abnormal nuclear stress test (08/08/2020), Anxiety, Closed fracture of distal end of left radius (08/16/2020), Depressed left ventricular ejection fraction, Depression, Diabetes mellitus without complication ([place]), Dilated cardiomyopathy ([place]) (08/08/2020), Encounter for Medicare annual wellness exam (10/19/2019), Gastroesophageal reflux disease (10/19/2019), GERD (gastroesophageal reflux disease), Hyperlipidemia, Hyperlipidemia associated with type 2 diabetes mellitus ([place]) (10/19/2019), LBBB (left bundle branch block) (08/17/2020), Mitral valve prolapse, Nonischemic cardiomyopathy ([place]) (08/17/2020), Osteopenia (10/19/2019), and Sinusitis (08/16/2019).   Surgical History:   Past Surgical History:  Procedure Laterality Date   BIV ICD INSERTION CRT-D N/A 02/05/2021   Procedure: BIV ICD INSERTION CRT-D;  Surgeon: [name], MD;  Location: [place] [facility];  Service: Cardiovascular;  Laterality: N/A;   BREAST LUMPECTOMY Left 2010   LEFT HEART CATH AND CORONARY ANGIOGRAPHY N/A 08/08/2020   Procedure: LEFT HEART CATH AND CORONARY ANGIOGRAPHY;  Surgeon: Leonie Man,  MD;  Location: [place] [facility];  Service: Cardiovascular;  Laterality: N/A;     Social History:   reports that she has never smoked. She has never used smokeless tobacco. She reports current alcohol use of about 2.0 standard drinks per week. She reports that she does not use drugs.   Family History:  Her family history includes Breast cancer in her daughter; Coronary artery disease in her father; Diabetes in her maternal grandfather and sister; Osteoporosis in her mother.   Allergies No Known Allergies   Home Medications  Prior to Admission medications   Medication Sig Start Date End Date Taking?  Authorizing Provider  cetirizine (ZYRTEC) 10 MG tablet Take 10 mg by mouth daily.   Yes [provider]  ezetimibe (ZETIA) 10 MG tablet Take 1 tablet by mouth once daily 11/27/20  Yes Cox, Kirsten, MD  FLUoxetine (PROZAC) 20 MG capsule Take 1 capsule by mouth once daily 01/26/21  Yes Cox, Kirsten, MD  fluticasone [facility]) 50 MCG/ACT nasal spray Use 2 spray(s) in each nostril once daily 01/02/21  Yes Cox, Kirsten, MD  Glucosamine-Chondroitin-MSM 1500-1200-500 MG PACK Take 1 tablet by mouth daily.   Yes [provider]  ibuprofen (ADVIL) 200 MG tablet Take 200 mg by mouth every 8 (eight) hours as needed for moderate pain.   Yes [provider]  LORazepam (ATIVAN) 0.5 MG tablet Take 1 tablet (0.5 mg total) by mouth at bedtime as needed for anxiety. 12/25/20  Yes Cox, Kirsten, MD  Melatonin 10 MG CAPS Take 10 mg by mouth at bedtime.   Yes [provider]  metoprolol succinate (TOPROL XL) 25 MG 24 hr tablet Take 0.5 tablets (12.5 mg total) by mouth daily. 07/26/20  Yes Tobb, Kardie, DO  Multiple Vitamin (MULTIVITAMIN) capsule Take 1 capsule by mouth daily.   Yes [provider]  Omega-3 Fatty Acids (FISH OIL) 1000 MG CAPS Take 1,000 mg by mouth 2 (two) times daily.   Yes [provider]  omeprazole (PRILOSEC) 20 MG capsule Take 1 capsule by mouth once daily 11/27/20  Yes Cox, Kirsten, MD  rosuvastatin (CRESTOR) 40 MG tablet Take 1 tablet (40 mg total) by mouth daily. 05/29/20  Yes Lillard Anes, MD  sacubitril-valsartan (ENTRESTO) 24-26 MG Take 1 tablet by mouth 2 (two) times daily. 09/07/20  Yes Tobb, Kardie, DO  spironolactone (ALDACTONE) 25 MG tablet Take 12.5 mg by mouth daily.   Yes [provider]  Vitamin D, Cholecalciferol, 25 MCG (1000 UT) CAPS Take 1,000 Units by mouth daily.   Yes [provider]     Montey Hora, [place] For pager details, please see AMION or use Epic chat   After 1900, please call [facility] for cross coverage needs 02/06/2021, 9:15 AM

## 2021-02-06 NOTE — Progress Notes (Signed)
Orthopedic Tech Progress Note Patient Details:  Audrey Ruiz Oct 06, 1950 [id number]  Patient ID: Audrey Ruiz, female   DOB: 1950/07/25, 70 y.o.   MRN: [id number] Pt has arm sling according to rn. Karolee Stamps 02/06/2021, 6:17 AM

## 2021-02-08 ENCOUNTER — Telehealth: Payer: Self-pay

## 2021-02-08 ENCOUNTER — Other Ambulatory Visit: Payer: Self-pay

## 2021-02-08 ENCOUNTER — Ambulatory Visit (INDEPENDENT_AMBULATORY_CARE_PROVIDER_SITE_OTHER): Payer: HMO | Admitting: Primary Care

## 2021-02-08 ENCOUNTER — Ambulatory Visit (INDEPENDENT_AMBULATORY_CARE_PROVIDER_SITE_OTHER): Payer: HMO

## 2021-02-08 ENCOUNTER — Encounter: Payer: Self-pay | Admitting: Primary Care

## 2021-02-08 VITALS — BP 102/64 | HR 69 | Temp 97.9°F | Ht 67.0 in | Wt 132.0 lb

## 2021-02-08 DIAGNOSIS — J948 Other specified pleural conditions: Secondary | ICD-10-CM

## 2021-02-08 DIAGNOSIS — I509 Heart failure, unspecified: Secondary | ICD-10-CM

## 2021-02-08 DIAGNOSIS — I5022 Chronic systolic (congestive) heart failure: Secondary | ICD-10-CM

## 2021-02-08 DIAGNOSIS — J9811 Atelectasis: Secondary | ICD-10-CM | POA: Diagnosis not present

## 2021-02-08 HISTORY — DX: Other specified pleural conditions: J94.8

## 2021-02-08 NOTE — Progress Notes (Signed)
@Patient  ID: Audrey Ruiz, female    DOB: [birth year], 70 y.o.   MRN: [id number]  Chief Complaint  Patient presents with   Hospitalization Follow-up    Pacemaker placed in the hospital. Hospitalized on Dec 19-20.     Referring provider: [name], MD  HPI: 70 year old female, never smoked. PMH significant for chronic CHF, LBBB, breast cancer, hydropneumothorax.   02/08/2021 Patient presents today for hospital follow-up. Accompanied by her husband. Patient was consulted in-patient by [facility] team for small-mod hydropneumothorax after ICD placement on 12/19. Due to sliding lung the only suitable place to aspirate or put chest tube was near her device pocket which could compromise her device stability so no procedure was recommenced. Given stability of her pneumothorax plan was discharge home with close follow-up. She is doing about the same today. She is not symptomatic. She has no shortness fo breath symptoms and is able to take deep breath.  She is more so sore across her chest. She had brief episode right back pain that was relieved ibuprofen. CXR showed similar appearance of left hydropneumothorax. Left base atelectasis.  No Known Allergies  Immunization History  Administered Date(s) Administered   Fluad Quad(high Dose 65+) 10/07/2018, 10/20/2019, 10/19/2020   Influenza, High Dose Seasonal PF 12/02/2017   Influenza, Seasonal, Injecte, Preservative Fre 11/22/2013   Influenza,inj,Quad PF,6-35 Mos 11/26/2012   Influenza-Unspecified 11/27/2009, 11/22/2010, 10/28/2011, 11/21/2014, 12/14/2015, 10/17/2016   Moderna Sars-Covid-2 Vaccination 04/16/2019, 05/17/2019, 01/17/2020   Pfizer Covid-19 Vaccine Bivalent Booster 75yrs & up 12/25/2020   Pneumococcal Conjugate-13 12/22/2014   Pneumococcal Polysaccharide-23 03/22/2010, 10/19/2020   Tdap 11/30/2012    Past Medical History:  Diagnosis Date   Abnormal nuclear stress test 08/08/2020   Abnormal nuclear stress test 08/08/2020    Anxiety    Closed fracture of distal end of left radius 08/16/2020   Depressed left ventricular ejection fraction    Depression    Diabetes mellitus without complication ([place])    Dilated cardiomyopathy ([place]) 08/08/2020   Encounter for Medicare annual wellness exam 10/19/2019   Gastroesophageal reflux disease 10/19/2019   GERD (gastroesophageal reflux disease)    Hyperlipidemia    Hyperlipidemia associated with type 2 diabetes mellitus ([place]) 10/19/2019   LBBB (left bundle branch block) 08/17/2020   Mitral valve prolapse    Nonischemic cardiomyopathy ([place]) 08/17/2020   Osteopenia 10/19/2019   Sinusitis 08/16/2019    Tobacco History: Social History   Tobacco Use  Smoking Status Never  Smokeless Tobacco Never   Counseling given: Not Answered   Outpatient Medications Prior to Visit  Medication Sig Dispense Refill   cetirizine (ZYRTEC) 10 MG tablet Take 10 mg by mouth daily.     ezetimibe (ZETIA) 10 MG tablet Take 1 tablet by mouth once daily 90 tablet 0   FLUoxetine (PROZAC) 20 MG capsule Take 1 capsule by mouth once daily 90 capsule 0   fluticasone (FLONASE) 50 MCG/ACT nasal spray Use 2 spray(s) in each nostril once daily 48 g 0   Glucosamine-Chondroitin-MSM 1500-1200-500 MG PACK Take 1 tablet by mouth daily.     ibuprofen (ADVIL) 200 MG tablet Take 200 mg by mouth every 8 (eight) hours as needed for moderate pain.     LORazepam (ATIVAN) 0.5 MG tablet Take 1 tablet (0.5 mg total) by mouth at bedtime as needed for anxiety. 30 tablet 1   Melatonin 10 MG CAPS Take 10 mg by mouth at bedtime.     metoprolol succinate (TOPROL XL) 25 MG 24 hr tablet  Take 0.5 tablets (12.5 mg total) by mouth daily. 45 tablet 3   Multiple Vitamin (MULTIVITAMIN) capsule Take 1 capsule by mouth daily.     Omega-3 Fatty Acids (FISH OIL) 1000 MG CAPS Take 1,000 mg by mouth 2 (two) times daily.     omeprazole (PRILOSEC) 20 MG capsule Take 1 capsule by mouth once daily 90 capsule 0   rosuvastatin (CRESTOR) 40 MG  tablet Take 1 tablet (40 mg total) by mouth daily. 90 tablet 2   sacubitril-valsartan (ENTRESTO) 24-26 MG Take 1 tablet by mouth 2 (two) times daily. 30 tablet 0   spironolactone (ALDACTONE) 25 MG tablet Take 12.5 mg by mouth daily.     Vitamin D, Cholecalciferol, 25 MCG (1000 UT) CAPS Take 1,000 Units by mouth daily.     No facility-administered medications prior to visit.    Review of Systems  Review of Systems  Constitutional: Negative.   Respiratory:  Negative for cough, chest tightness, shortness of breath and wheezing.   Cardiovascular: Negative.     Physical Exam  BP 102/64 (BP Location: Left Arm, Patient Position: Sitting, Cuff Size: Normal)    Pulse 69    Temp 97.9 F (36.6 C) (Oral)    Ht 5\' 7"  (1.702 m)    Wt 132 lb (59.9 kg)    SpO2 99%    BMI 20.67 kg/m  Physical Exam Constitutional:      Appearance: Normal appearance.  HENT:     Head: Normocephalic and atraumatic.  Cardiovascular:     Rate and Rhythm: Normal rate and regular rhythm.  Pulmonary:     Effort: Pulmonary effort is normal.     Breath sounds: Normal breath sounds. No wheezing, rhonchi or rales.  Skin:    Comments: Left chest well dress CDI  Neurological:     General: No focal deficit present.     Mental Status: She is alert and oriented to person, place, and time. Mental status is at baseline.  Psychiatric:        Mood and Affect: Mood normal.        Behavior: Behavior normal.        Thought Content: Thought content normal.        Judgment: Judgment normal.     Lab Results:  CBC    Component Value Date/Time   WBC 8.0 01/16/2021 0000   WBC 7.0 12/29/2008 1518   RBC 3.80 01/16/2021 0000   RBC 4.22 12/29/2008 1518   HGB 11.9 01/16/2021 0000   HCT 37.1 01/16/2021 0000   PLT 306 01/16/2021 0000   MCV 98 (H) 01/16/2021 0000   MCH 31.3 01/16/2021 0000   MCHC 32.1 01/16/2021 0000   MCHC 34.5 12/29/2008 1518   RDW 14.0 01/16/2021 0000   LYMPHSABS 2.2 12/25/2020 0914   MONOABS 0.3 12/29/2008  1518   EOSABS 0.1 12/25/2020 0914   BASOSABS 0.1 12/25/2020 0914    BMET    Component Value Date/Time   NA 139 01/10/2021 1225   K 4.7 01/10/2021 1225   CL 104 01/10/2021 1225   CO2 24 01/10/2021 1225   GLUCOSE 91 01/10/2021 1225   GLUCOSE 102 (H) 12/29/2008 1518   BUN 16 01/10/2021 1225   CREATININE 0.68 01/10/2021 1225   CALCIUM 10.3 01/10/2021 1225   GFRNONAA 90 10/19/2019 0908   GFRAA 104 10/19/2019 0908    BNP No results found for: BNP  ProBNP No results found for: PROBNP  Imaging: DG Chest 2 View  Result Date: 02/08/2021 CLINICAL  DATA:  CHF EXAM: CHEST - 2 VIEW COMPARISON:  12/07/2020 FINDINGS: Left AICD remains in place, unchanged. Left hydropneumothorax again noted. This is centrally unchanged since prior study with approximately 10% pneumothorax component. Right lung clear. Minimal left base atelectasis. Heart is normal size. IMPRESSION: Left hydropneumothorax again noted, not significantly changed. Left base atelectasis. Electronically Signed   By: Rolm Baptise M.D.   On: 02/08/2021 10:50   DG Chest 2 View  Result Date: 02/06/2021 CLINICAL DATA:  Cardiac device in-situ, pacemaker. EXAM: CHEST - 2 VIEW COMPARISON:  02/05/2021. FINDINGS: The heart size and mediastinal contours are stable. There is hyperinflation of the lungs. Mild subsegmental atelectasis is noted in the mid left lung and at the left lung base. Small left pleural effusion is unchanged. There is a small to moderate left-sided pneumothorax measuring approximally 3.1 cm from the left lung apex, not significantly changed from the prior exam. No acute osseous abnormality. A multi lead pacemaker device is seen over the left chest and unchanged from the prior exam. IMPRESSION: Small to moderate left hydropneumothorax, not significantly changed from the prior exam. Atelectasis is noted in the mid left lung and at the left lung base. Electronically Signed   By: Brett Fairy M.D.   On: 02/06/2021 03:46   DG Chest  2 View  Result Date: 02/05/2021 CLINICAL DATA:  Pacemaker placement EXAM: CHEST - 2 VIEW COMPARISON:  04/10/2018 FINDINGS: Interval left 3 lead subclavian pacemaker defibrillator placement with leads overlying the expected position. Development of a small to moderate left hydropneumothorax with both apical and posterior gaseous components. Small pleural fluid component. Right lung is clear. No pneumothorax or pleural effusion on the right. Cardiac size within normal limits. No mediastinal shift. Pulmonary vascularity is normal. No acute bone abnormality. IMPRESSION: Status post left subclavian pacemaker placement. Small to moderate left hydropneumothorax. No radiographic evidence of tension physiology. These results will be called to the ordering clinician or representative by the Radiologist Assistant, and communication documented in the PACS or [employer]. Electronically Signed   By: Fidela Salisbury M.D.   On: 02/05/2021 20:30   EP PPM/ICD IMPLANT  Result Date: 02/05/2021 SURGEON: Allegra Lai, MD PREPROCEDURE DIAGNOSES: 1. Nonischemic cardiomyopathy. 2. New York Heart Association class III, heart failure chronically. 3. Left bundle-branch block. POSTPROCEDURE DIAGNOSES: 1. Nonischemic cardiomyopathy. 2. New York Heart Association class III heart failure chronically. 3. Left bundle-branch block. PROCEDURES: 1. Left upper extremity venography 2. Biventricular ICD implantation. INTRODUCTION:  Shawna Wearing is a 70 y.o. female with a nonischemic CM (EF 30-35%), NYHA Class III CHF, and LBBB QRS morophology. At this time, she  meets MADIT II/ SCD-HeFT criteria for ICD implantation for primary prevention of sudden death. Given LBBB, the patient may also be expected to benefit from resynchronization therapy. The patient has been treated with an optimal medical regimen but continues to have a depressed ejection fraction and NYHA Class III CHF symptoms. he therefore presents today for a  biventricular ICD implantation. DESCRIPTION OF PROCEDURE: Informed written consent was obtained and the patient was brought to the electrophysiology lab in the fasting state. The patient was adequately sedated with intravenous Versed, and fentanyl as outlined in the nursing report. The patient's left chest was prepped and draped in the usual sterile fashion by the EP lab staff. The skin overlying the left deltopectoral region was infiltrated with lidocaine for local analgesia. A 5-cm incision was made over the left deltopectoral region. A left subcutaneous defibrillator pocket was fashioned using a  combination of sharp and blunt dissection. Electrocautery was used to assure hemostasis. RA/RV Lead Placement: The left axillary vein was cannulated with fluoroscopic visualization. No contrast was required for this endeavor. Through the left axillary vein, an Abbott model Tendril MRI [id number]/52(serial # [id number] ) right atrial lead and an Abbott model Durata [id number] (serial number [id number]) right ventricular defibrillator lead were advanced with fluoroscopic visualization into the right atrial appendage and right ventricular apex positions respectively. Initial atrial lead P-waves measured 5.9  mV with an impedance of 654 ohms and a threshold of 0.8 volts at 0.5 milliseconds. The right ventricular lead R-wave measured 21.7 mV with impedance of 694 ohms and a threshold of 0.4 volts at 0.5 milliseconds. LV Lead Placement: An Abbott 135 guide was advanced through the left axillary vein into the low lateral right atrium. A Bard curved Damato catheter was introduced through the 135 guide and used to cannulate the coronary sinus. Coronary sinus cannulation was confirmed with electrogram recording from the hexapolar catheter. A selective coronary sinus venogram was performed by hand injection of nonionic contrast. This demonstrated a large CS body with very small/ atretic distal branches. There was a moderate sized lateral  coronary sinus branch was noted along the mid portion of the CS body. No other posterior branches were identified. A Whisper CSJ wire was introduced through the guide and advanced into the distal posterolateral branch. An Abbott Quartet [id number] (serial number [id number] ) lead was advanced through the guide into the lateral branch. This was approximately one-thirds from the base to the apex in a very lateral position. In this location, the left ventricular lead R-waves measured 8.7 mV with impedance of 888 ohms and a threshold of 1 volt at 0.5 milliseconds in the bipolar LV1-LV2 configuration with no diaphragmatic stimulation observed when pacing at 10 volts output. The MB-2 guide was therefore removed. All three leads were secured to the pectoralis fascia using #2 silk suture over the suture sleeves. The pocket then irrigated with copious gentamicin solution. The leads were then connected to an [place] HF [id number] (serial Number [id number] ) device. The defibrillator was placed into the pocket. The pocket was then closed in 3 layers with 2.0 Vicryl suture for the subcutaneous and 3.0 Vicryl suture subcuticular layers. Steri-Strips and a sterile dressing were then applied. DFT testing was not performed today. The procedure was therefore considered completed. EBL<38ml. There were no early apparhent complications. CONCLUSIONS: 1. Nonischemic cardiomyopathy with Left bundle-branch block and chronic New York Heart Association class III heart failure. 2. Successful biventricular ICD implantation. 3. No early apparent complications.     Assessment & Plan:   Hydropneumothorax - Patient developed small-moderate hydropneumothorax after ICD placement on 02/05/21. Repeat CXR today showed similar appearance of left hydropneumothorax. Left base atelectasis. She remains clinically stable. She is asymptomatic and hemodynamically stable. Plan to continue to monitor, needs repeat CXR next week. If she developed  acute chest pain or sudden shortness of breath recommended patient present to ED for evaluation.      Martyn Ehrich, NP 02/08/2021

## 2021-02-08 NOTE — Patient Instructions (Addendum)
Recommendations: - Monitor for sharp left sided chest pain or sudden shortness of breath- if these symptoms occur please call EMS or presents to ED - Come in any time Tuesday December 26th for CXR (avoid lung between 1-2pm)  Orders: - CXR Tuesday   Follow-up: - We will call Wednesday to review CXR results    Pneumothorax A pneumothorax is commonly called a collapsed lung. It is a condition in which air leaks from a lung and builds up between the thin layer of tissue that covers the lungs (visceral pleura)and the interior wall of the chest cavity (parietal pleura). The air gets trapped outside the lung, between the lung and the chest wall (pleural space). The air takes up space and prevents the lung from fully expanding. This condition sometimes occurs suddenly with no apparent cause. The buildup of air may be small or large. A small pneumothorax may go away on its own. A large pneumothorax will require treatment and hospitalization. What are the causes? This condition may be caused by: Trauma and injury to the chest wall. Surgery and other medical procedures. A complication of an underlying lung problem, especially chronic obstructive pulmonary disease (COPD) or emphysema. Sometimes the cause of this condition is not known. What increases the risk? You are more likely to develop this condition if: You have an underlying lung problem. You smoke. You are 15-37 years old, female, tall, and underweight. You have a personal or family history of pneumothorax. You have an eating disorder (anorexia nervosa). This condition can also happen quickly, even in people with no history of lung problems. What are the signs or symptoms? Sometimes a pneumothorax will have no symptoms. When symptoms are present, they may include: Chest pain. Shortness of breath. Increased rate of breathing. Bluish color to the skin, lips, or fingernails (cyanosis). How is this diagnosed? This condition may be diagnosed  by: A medical history and physical exam. A chest X-ray, chest CT scan, or ultrasound. How is this treated? Treatment depends on how severe your condition is. The goal of treatment is to remove the extra air and allow your lung to expand back to its normal size. For a small pneumothorax: No treatment may be needed. Extra oxygen is sometimes used to make it go away more quickly. For a large pneumothorax or one that is causing symptoms, a procedure is done to release the air from around your lungs. To do this, a health care provider may use: A needle with a syringe. This is used to suck air from a pleural space where no additional leakage is taking place. A chest tube. This is used to suck air where there is ongoing leakage into the pleural space. The chest tube may need to remain in place for several days until the air leak has healed. In more severe cases, surgery may be needed to repair the damage that is causing the leak. If you have multiple pneumothorax episodes or have an air leak that will not heal, a procedure called a pleurodesis may be done. A medicine is placed in the pleural space to irritate the tissues around the lung so that the lung will stick to the chest wall, seal any leaks, and stop any buildup of air in that space. If you have an underlying lung problem, severe symptoms, or a large pneumothorax you will usually need to stay in the hospital. Follow these instructions at home: Lifestyle Do not use any products that contain nicotine or tobacco. These products include cigarettes, chewing tobacco,  and vaping devices, such as e-cigarettes. If you need help quitting, ask your health care provider. Do not lift anything that is heavier than 10 lb (4.5 kg), or the limit that you are told, until your health care provider says that it is safe. Avoid activities that take a lot of effort (are strenuous) for as long as told by your health care provider. Return to your normal activities as told  by your health care provider. Ask your health care provider what activities are safe for you. Do not fly in an airplane or scuba dive until your health care provider says it is okay. General instructions Take over-the-counter and prescription medicines only as told by your health care provider. If a cough or pain makes it difficult for you to sleep at night, try sleeping in a semi-upright position in a recliner or by using 2 or 3 pillows. If you had a chest tube and it was removed, ask your health care provider when you can remove the bandage (dressing). While the dressing is in place, do not allow it to get wet. Keep all follow-up visits. This is important. Contact a health care provider if: You cough up thick mucus (sputum) that is yellow or green. You were treated with a chest tube, and you have redness, increasing pain, or discharge at the site where it was placed. Get help right away if: You have increasing chest pain or shortness of breath. You have a cough that will not go away. You begin coughing up blood. You have pain that is getting worse or is not controlled with medicines. The site where your chest tube was located opens up. You feel air coming out of the site where the chest tube was placed. You have a fever or symptoms that last for more than 2-3 days. Your skin, lips, or fingernails turn blue. These symptoms may represent a serious problem that is an emergency. Do not wait to see if the symptoms will go away. Get medical help right away. Call your local emergency services (911 in the [place] Do not drive yourself to the hospital. Summary A pneumothorax, commonly called a collapsed lung, is a condition in which air leaks from a lung and gets trapped between the lung and the chest wall (pleural space). The buildup of air may be small or large. A small pneumothorax may go away on its own. A large pneumothorax will require treatment and hospitalization. Treatment for this condition  depends on how severe the pneumothorax is. The goal of treatment is to remove the extra air and allow the lung to expand back to its normal size. Get help right away if you have increasing chest pain or shortness of breath. This information is not intended to replace advice given to you by your health care provider. Make sure you discuss any questions you have with your health care provider. Document Revised: 07/13/2020 Document Reviewed: 07/13/2020 Elsevier Patient Education  [place].

## 2021-02-08 NOTE — Progress Notes (Signed)
° ° °  Chronic Care Management Pharmacy Assistant   Name: Audrey Ruiz  MRN: [id number] DOB: 1950/11/11   Reason for Encounter: General Adherence Call    Recent office visits:  12/25/20 [name] MD. Seen for HLD and GERD. Referral to Podiatry. Started on Lorazepam 0.5 mg at bedtime prn.   Recent consult visits:  02/08/21 (Pulmonology) [name] NP. Seen for CHF hospital follow up. No med changes.   01/15/21 (Podiatry) [name] DPM. Seen for Ganglion Cyst of Right foot. No med changes.   01/10/21 (Cardiology) [name] MD. Seen for Nonischemic Cardiomyopathy. No med changes.  Hospital visits:  Medication Reconciliation was completed by comparing discharge summary, patients EMR and Pharmacy list, and upon discussion with patient.  Admitted to the hospital on 02/05/21 due to CHF. Discharge date was 02/06/21. Discharged from [facility].    Medications remain the same after Hospital Discharge:??     Medications: Outpatient Encounter Medications as of 02/08/2021  Medication Sig   cetirizine (ZYRTEC) 10 MG tablet Take 10 mg by mouth daily.   ezetimibe (ZETIA) 10 MG tablet Take 1 tablet by mouth once daily   FLUoxetine (PROZAC) 20 MG capsule Take 1 capsule by mouth once daily   fluticasone (FLONASE) 50 MCG/ACT nasal spray Use 2 spray(s) in each nostril once daily   Glucosamine-Chondroitin-MSM 1500-1200-500 MG PACK Take 1 tablet by mouth daily.   ibuprofen (ADVIL) 200 MG tablet Take 200 mg by mouth every 8 (eight) hours as needed for moderate pain.   LORazepam (ATIVAN) 0.5 MG tablet Take 1 tablet (0.5 mg total) by mouth at bedtime as needed for anxiety.   Melatonin 10 MG CAPS Take 10 mg by mouth at bedtime.   metoprolol succinate (TOPROL XL) 25 MG 24 hr tablet Take 0.5 tablets (12.5 mg total) by mouth daily.   Multiple Vitamin (MULTIVITAMIN) capsule Take 1 capsule by mouth daily.   Omega-3 Fatty Acids (FISH OIL) 1000 MG CAPS  Take 1,000 mg by mouth 2 (two) times daily.   omeprazole (PRILOSEC) 20 MG capsule Take 1 capsule by mouth once daily   rosuvastatin (CRESTOR) 40 MG tablet Take 1 tablet (40 mg total) by mouth daily.   sacubitril-valsartan (ENTRESTO) 24-26 MG Take 1 tablet by mouth 2 (two) times daily.   spironolactone (ALDACTONE) 25 MG tablet Take 12.5 mg by mouth daily.   Vitamin D, Cholecalciferol, 25 MCG (1000 UT) CAPS Take 1,000 Units by mouth daily.   No facility-administered encounter medications on file as of 02/08/2021.   Contacted [name] for general disease state and medication adherence call.   Patient > 5 days past due for refill on the following medications per chart history:  [facility] Medications: Medication Name/mg Last Fill Days Supply Rosuvastatin 40 mg   11/17/20 90ds     08/25/20  90ds  What concerns do you have about your medications?  How often do you forget or accidentally miss a dose? Never  Do you use a pillbox? Yes   I was unable to get in touch with pt today to complete call    Care Gaps: Last annual wellness visit? 07/25/32 If applicable: Last eye exam / retinopathy screening?Neve done  Diabetic foot exam? Never done  Mammogram: 06/29/19 Colonoscopy: 06/21/16 Dexa Scan: 03/18/18   [name], [place] Clinical Pharmacist Assistant  [phone]

## 2021-02-08 NOTE — Assessment & Plan Note (Addendum)
-   Patient developed small-moderate hydropneumothorax after ICD placement on 02/05/21. Repeat CXR today showed similar appearance of left hydropneumothorax. Left base atelectasis. She remains clinically stable. She is asymptomatic and hemodynamically stable. Plan to continue to monitor, needs repeat CXR next week. If she developed acute chest pain or sudden shortness of breath recommended patient present to ED for evaluation.

## 2021-02-13 ENCOUNTER — Ambulatory Visit (INDEPENDENT_AMBULATORY_CARE_PROVIDER_SITE_OTHER): Payer: HMO

## 2021-02-13 ENCOUNTER — Other Ambulatory Visit: Payer: Self-pay | Admitting: Primary Care

## 2021-02-13 DIAGNOSIS — J948 Other specified pleural conditions: Secondary | ICD-10-CM

## 2021-02-13 DIAGNOSIS — J9811 Atelectasis: Secondary | ICD-10-CM | POA: Diagnosis not present

## 2021-02-13 NOTE — Progress Notes (Signed)
Cardiology Office Note Date:  02/15/2021  Patient ID:  Audrey Ruiz, DOB January 09, 1951, MRN [id number] PCP:  [name], MD  Cardiologist:  Dr. Harriet Masson Electrophysiologist: Dr. Curt Bears    Chief Complaint: wound check, post ICD  History of Present Illness: Audrey Ruiz is a 70 y.o. female with history of NICM, chronic CHF (systolic), breast cancer (treated with Adriamycin), LBBB  She comes in today to be seen for Dr. Curt Bears s/p ICD implant  She was brought in outpatient 02/05/21, unfortunately complicated by hydroneumothorax.  Pulmonary medicine saw her, noted with [place] could not identify a safe pocket for either needle aspiration or pigtail catheter placement (sliding lung noted throughout posterior lung fields), given stability of her 2nd CXR, minimal is any symptoms, and hemodynamic stability, planned to follow up closely out patient. She was discharged 02/06/21  She saw pulmonary service 02/08/21, CXR remained stable with similar appearance of left hydropneumothorax, she remained asymptomatic with rcommendation to repeat CXR in a week and if develops any acute change/symptoms to go to the ER.  TODAY She is accompanied by her husband today She feels OK Wound/site discomfort is improved, no SOB Feels like it took a while to feel more comfortable post procedure No near syncope or syncope. Some infrequent awareness in her heart beat No CP  F/u CXR 02/13/21 noted stable pneumothorax, pulm recommendations pending    Device information Abbott CRT-D implanted 02/05/21    Past Medical History:  Diagnosis Date   Abnormal nuclear stress test 08/08/2020   Abnormal nuclear stress test 08/08/2020   Anxiety    Closed fracture of distal end of left radius 08/16/2020   Depressed left ventricular ejection fraction    Depression    Diabetes mellitus without complication ([place])    Dilated cardiomyopathy ([place]) 08/08/2020   Encounter for Medicare annual wellness exam  10/19/2019   Gastroesophageal reflux disease 10/19/2019   GERD (gastroesophageal reflux disease)    Hyperlipidemia    Hyperlipidemia associated with type 2 diabetes mellitus ([place]) 10/19/2019   LBBB (left bundle branch block) 08/17/2020   Mitral valve prolapse    Nonischemic cardiomyopathy ([place]) 08/17/2020   Osteopenia 10/19/2019   Sinusitis 08/16/2019    Past Surgical History:  Procedure Laterality Date   BIV ICD INSERTION CRT-D N/A 02/05/2021   Procedure: BIV ICD INSERTION CRT-D;  Surgeon: [name], MD;  Location: [facility];  Service: Cardiovascular;  Laterality: N/A;   BREAST LUMPECTOMY Left 2010   LEFT HEART CATH AND CORONARY ANGIOGRAPHY N/A 08/08/2020   Procedure: LEFT HEART CATH AND CORONARY ANGIOGRAPHY;  Surgeon: Leonie Man, MD;  Location: [facility];  Service: Cardiovascular;  Laterality: N/A;    Current Outpatient Medications  Medication Sig Dispense Refill   cetirizine (ZYRTEC) 10 MG tablet Take 10 mg by mouth daily.     ezetimibe (ZETIA) 10 MG tablet Take 1 tablet by mouth once daily 90 tablet 0   FLUoxetine (PROZAC) 20 MG capsule Take 1 capsule by mouth once daily 90 capsule 0   fluticasone (FLONASE) 50 MCG/ACT nasal spray Use 2 spray(s) in each nostril once daily 48 g 0   Glucosamine-Chondroitin-MSM 1500-1200-500 MG PACK Take 1 tablet by mouth daily.     ibuprofen (ADVIL) 200 MG tablet Take 200 mg by mouth every 8 (eight) hours as needed for moderate pain.     LORazepam (ATIVAN) 0.5 MG tablet Take 1 tablet (0.5 mg total) by mouth at bedtime as needed for anxiety. [place]  tablet 1   Melatonin 10 MG CAPS Take 10 mg by mouth at bedtime.     metoprolol succinate (TOPROL XL) 25 MG 24 hr tablet Take 0.5 tablets (12.5 mg total) by mouth daily. 45 tablet 3   Multiple Vitamin (MULTIVITAMIN) capsule Take 1 capsule by mouth daily.     Omega-3 Fatty Acids (FISH OIL) 1000 MG CAPS Take 1,000 mg by mouth 2 (two) times daily.     omeprazole (PRILOSEC) 20 MG  capsule Take 1 capsule by mouth once daily 90 capsule 0   rosuvastatin (CRESTOR) 40 MG tablet Take 1 tablet (40 mg total) by mouth daily. 90 tablet 2   sacubitril-valsartan (ENTRESTO) 24-26 MG Take 1 tablet by mouth 2 (two) times daily. 30 tablet 0   spironolactone (ALDACTONE) 25 MG tablet Take 12.5 mg by mouth daily.     Vitamin D, Cholecalciferol, 25 MCG (1000 UT) CAPS Take 1,000 Units by mouth daily.     No current facility-administered medications for this visit.    Allergies:   Patient has no known allergies.   Social History:  The patient  reports that she has never smoked. She has never used smokeless tobacco. She reports current alcohol use of about 2.0 standard drinks per week. She reports that she does not use drugs.   Family History:  The patient's family history includes Breast cancer in her daughter; Coronary artery disease in her father; Diabetes in her maternal grandfather and sister; Osteoporosis in her mother.  ROS:  Please see the history of present illness.    All other systems are reviewed and otherwise negative.   PHYSICAL EXAM:  VS:  BP (!) 110/58    Pulse (!) 53    Ht 5\' 7"  (1.702 m)    Wt 132 lb (59.9 kg)    SpO2 100%    BMI 20.67 kg/m  BMI: Body mass index is 20.67 kg/m. Well nourished, well developed, in no acute distress HEENT: normocephalic, atraumatic Neck: no JVD, carotid bruits or masses Cardiac:  RRR; no significant murmurs, no rubs, or gallops Lungs:  CTA b/l, no wheezing, rhonchi or rales Abd: soft, nontender MS: no deformity or atrophy Ext: no edema Skin: warm and dry, no rash Neuro:  No gross deficits appreciated Psych: euthymic mood, full affect  ICD site steri strips are removed without difficulty, no wound edges are well healed, no erythema, edema, hematoma. No heat, not tender. She has some slight ecchymosis in late stage just under her L breast/and laterally from there, no tender   EKG:  not done today  Device interrogation done today  and reviewed by myself:  Battery and lead measurements are good >99% BP No VT 984 AMS All available EGMs are reviewed , all are false for FF on the A channel A lead sensitivity was changed from auto to 0.62mV Acute implant lead outputs remain   09/06/20: TTE IMPRESSIONS   1. Dysynchronous LV most likely secondary to IVCD. In view of this, EF  assessment is not optimal. May consider other modalities for better  assessment.. Left ventricular ejection fraction, by estimation, is 30 to  35%. The left ventricle has moderately  decreased function. The left ventricle has no regional wall motion  abnormalities. Left ventricular diastolic parameters are consistent with  Grade I diastolic dysfunction (impaired relaxation).   2. Right ventricular systolic function is normal. The right ventricular  size is normal. There is normal pulmonary artery systolic pressure.   3. The mitral valve is degenerative. No  evidence of mitral valve  regurgitation. No evidence of mitral stenosis.   4. The aortic valve is normal in structure. Aortic valve regurgitation is  not visualized. No aortic stenosis is present.   5. The inferior vena cava is normal in size with greater than 50%  respiratory variability, suggesting right atrial pressure of 3 mmHg.      08/08/20: LHC SUMMARY Angiographically minimal CAD with short subepicardial portion of the proximal LAD that is relatively diminutive, but widely patent.  Very tortuous vessels. Normal to low LVEDP. Nonischemic Cardiomyopathy: question if this is related to Left Bundle Branch Block with False Positive Stress Test related to Left Bundle Branch Block Wall Motion.  Recent Labs: 08/17/2020: Magnesium 2.0 12/25/2020: ALT 32; TSH 2.490 01/10/2021: BUN 16; Creatinine, Ser 0.68; Potassium 4.7; Sodium 139 01/16/2021: Hemoglobin 11.9; Platelets 306  12/25/2020: Chol/HDL Ratio 1.8; Cholesterol, Total 133; HDL 73; LDL Chol Calc (NIH) 45; Triglycerides 78   CrCl cannot  be calculated (Patient's most recent lab result is older than the maximum 21 days allowed.).   Wt Readings from Last 3 Encounters:  02/15/21 132 lb (59.9 kg)  02/08/21 132 lb (59.9 kg)  02/05/21 125 lb (56.7 kg)     Other studies reviewed: Additional studies/records reviewed today include: summarized above  ASSESSMENT AND PLAN:  ICD Implant complicated by hydropneumothorax Denies SOB RA O2 sat is 100% today  AFTER she left recheck on her CXR, pulmonary placed a result note/recommendation with continued weekly CXR and had called leaving her a message to call back, will defer to them ongoing follow up and management   NICM LBBB Chronic CHF (systolic) No symptoms of exam findings of volume OL Appt in place with HF clinic and Dr. Harriet Masson   Disposition: F/u with remotes as usual, Dr. Curt Bears as scheduled and pulmonary as recommended by them   Current medicines are reviewed at length with the patient today.  The patient did not have any concerns regarding medicines.  Venetia Night, PA-C 02/15/2021 5:13 PM     [address] [place] [id number] [phone] (office)  [phone] (fax)

## 2021-02-14 NOTE — Progress Notes (Signed)
Please let patient know CXR yesterday showed stable left sided hydropneumothorax. Dr. Lake Bells reviewed CXR and recommended to continuing to monitor with weekly CXR. Notify us if she develops respiratory complaints. If she develops sudden chest pain or shortness of breath need ED evaluation.      Per Dr. Lake Bells "I've reviewed both X-rays, I would continue to watch this and follow with serial chest x-rays. We looked with ultrasound when she was here and really the only window we had to remove the air would have been to aspirate right next to her pacemaker. Either that or put in a large bore chest tube which seemed too invasive considering that she feels OK.   Would get CXR's weekly.

## 2021-02-14 NOTE — Progress Notes (Signed)
Please let patient know CXR showed stable left sided pneumothorax. Dr. Lake Bells is off, I will send to Dr. Halford Chessman to look over and notify her of anything further recommendations

## 2021-02-15 ENCOUNTER — Telehealth: Payer: Self-pay | Admitting: Primary Care

## 2021-02-15 ENCOUNTER — Telehealth: Payer: Self-pay | Admitting: Physician Assistant

## 2021-02-15 ENCOUNTER — Other Ambulatory Visit: Payer: Self-pay

## 2021-02-15 ENCOUNTER — Ambulatory Visit (INDEPENDENT_AMBULATORY_CARE_PROVIDER_SITE_OTHER): Payer: HMO | Admitting: Physician Assistant

## 2021-02-15 ENCOUNTER — Encounter: Payer: Self-pay | Admitting: Physician Assistant

## 2021-02-15 VITALS — BP 110/58 | HR 53 | Ht 67.0 in | Wt 132.0 lb

## 2021-02-15 DIAGNOSIS — Z5189 Encounter for other specified aftercare: Secondary | ICD-10-CM | POA: Diagnosis not present

## 2021-02-15 DIAGNOSIS — Z95810 Presence of automatic (implantable) cardiac defibrillator: Secondary | ICD-10-CM

## 2021-02-15 DIAGNOSIS — I5022 Chronic systolic (congestive) heart failure: Secondary | ICD-10-CM | POA: Diagnosis not present

## 2021-02-15 DIAGNOSIS — I428 Other cardiomyopathies: Secondary | ICD-10-CM | POA: Diagnosis not present

## 2021-02-15 DIAGNOSIS — J948 Other specified pleural conditions: Secondary | ICD-10-CM

## 2021-02-15 LAB — CUP PACEART INCLINIC DEVICE CHECK
Battery Remaining Longevity: 62 mo
Brady Statistic RA Percent Paced: 0.98 %
Brady Statistic RV Percent Paced: 99.03 %
Date Time Interrogation Session: 20221229182816
HighPow Impedance: 55.125
Implantable Lead Implant Date: 20221219
Implantable Lead Implant Date: 20221219
Implantable Lead Implant Date: 20221219
Implantable Lead Location: 753858
Implantable Lead Location: 753859
Implantable Lead Location: 753860
Implantable Pulse Generator Implant Date: 20221219
Lead Channel Impedance Value: 412.5 Ohm
Lead Channel Impedance Value: 537.5 Ohm
Lead Channel Impedance Value: 562.5 Ohm
Lead Channel Pacing Threshold Amplitude: 0.75 V
Lead Channel Pacing Threshold Amplitude: 0.75 V
Lead Channel Pacing Threshold Amplitude: 0.75 V
Lead Channel Pacing Threshold Amplitude: 0.75 V
Lead Channel Pacing Threshold Amplitude: 1 V
Lead Channel Pacing Threshold Amplitude: 1 V
Lead Channel Pacing Threshold Pulse Width: 0.5 ms
Lead Channel Pacing Threshold Pulse Width: 0.5 ms
Lead Channel Pacing Threshold Pulse Width: 0.5 ms
Lead Channel Pacing Threshold Pulse Width: 0.5 ms
Lead Channel Pacing Threshold Pulse Width: 0.5 ms
Lead Channel Pacing Threshold Pulse Width: 0.5 ms
Lead Channel Sensing Intrinsic Amplitude: 12 mV
Lead Channel Sensing Intrinsic Amplitude: 2.6 mV
Lead Channel Setting Pacing Amplitude: 3.5 V
Lead Channel Setting Pacing Amplitude: 3.5 V
Lead Channel Setting Pacing Amplitude: 3.5 V
Lead Channel Setting Pacing Pulse Width: 0.5 ms
Lead Channel Setting Pacing Pulse Width: 0.5 ms
Lead Channel Setting Sensing Sensitivity: 0.5 mV
Pulse Gen Serial Number: 111053351

## 2021-02-15 NOTE — Patient Instructions (Signed)
Medication Instructions:   Your physician recommends that you continue on your current medications as directed. Please refer to the Current Medication list given to you today.  *If you need a refill on your cardiac medications before your next appointment, please call your pharmacy*   Lab Work: NONE ORDERED  TODAY   If you have labs (blood work) drawn today and your tests are completely normal, you will receive your results only by: . MyChart Message (if you have MyChart) OR . A paper copy in the mail If you have any lab test that is abnormal or we need to change your treatment, we will call you to review the results.   Testing/Procedures: NONE ORDERED  TODAY    Follow-Up: At [facility], you and your health needs are our priority.  As part of our continuing mission to provide you with exceptional heart care, we have created designated Provider Care Teams.  These Care Teams include your primary Cardiologist (physician) and Advanced Practice Providers (APPs -  Physician Assistants and Nurse Practitioners) who all work together to provide you with the care you need, when you need it.  We recommend signing up for the patient portal called "MyChart".  Sign up information is provided on this After Visit Summary.  MyChart is used to connect with patients for Virtual Visits (Telemedicine).  Patients are able to view lab/test results, encounter notes, upcoming appointments, etc.  Non-urgent messages can be sent to your provider as well.   To learn more about what you can do with MyChart, go to [email or web address].    Your next appointment:  AS SCHEDULED   Other Instructions   

## 2021-02-15 NOTE — Telephone Encounter (Signed)
Patient calling to find out when she can drive.

## 2021-02-16 ENCOUNTER — Other Ambulatory Visit: Payer: Self-pay

## 2021-02-16 NOTE — Progress Notes (Unsigned)
I have pended the order for weekly CXR, called and LVM in regards to patients test results. Will try again later.

## 2021-02-16 NOTE — Telephone Encounter (Signed)
have pended the order for weekly CXR, called and LVM in regards to patients test results. Will try again later.

## 2021-02-16 NOTE — Telephone Encounter (Signed)
Audrey Ehrich, NP  02/14/2021 11:59 AM EST     Please let patient know CXR yesterday showed stable left sided hydropneumothorax. Dr. Lake Bells reviewed CXR and recommended to continuing to monitor with weekly CXR. Notify us if she develops respiratory complaints. If she develops sudden chest pain or shortness of breath need ED evaluation.          Per Dr. Lake Bells "I've reviewed both X-rays, I would continue to watch this and follow with serial chest x-rays. We looked with ultrasound when she was here and really the only window we had to remove the air would have been to aspirate right next to her pacemaker.  Either that or put in a large bore chest tube which seemed too invasive considering that she feels OK.    Would get CXR's weekly.      Called and spoke with pt letting her know the results of the cxr per BW and that we are going to need to get weekly cxr to follow up on this. Pt verbalized understanding. Order for cxr has been placed. Nothing further needed.

## 2021-02-21 ENCOUNTER — Encounter (HOSPITAL_COMMUNITY): Payer: HMO

## 2021-02-22 ENCOUNTER — Other Ambulatory Visit: Payer: Self-pay | Admitting: Legal Medicine

## 2021-02-22 ENCOUNTER — Other Ambulatory Visit: Payer: Self-pay | Admitting: Family Medicine

## 2021-02-22 DIAGNOSIS — K219 Gastro-esophageal reflux disease without esophagitis: Secondary | ICD-10-CM

## 2021-02-23 ENCOUNTER — Encounter: Payer: Self-pay | Admitting: Family Medicine

## 2021-02-23 ENCOUNTER — Telehealth (INDEPENDENT_AMBULATORY_CARE_PROVIDER_SITE_OTHER): Payer: HMO | Admitting: Family Medicine

## 2021-02-23 VITALS — Ht 66.5 in | Wt 126.0 lb

## 2021-02-23 DIAGNOSIS — J069 Acute upper respiratory infection, unspecified: Secondary | ICD-10-CM | POA: Diagnosis not present

## 2021-02-23 DIAGNOSIS — U071 COVID-19: Secondary | ICD-10-CM | POA: Diagnosis not present

## 2021-02-23 MED ORDER — MOLNUPIRAVIR EUA 200MG CAPSULE: 4.0000 | ORAL_CAPSULE | Freq: Two times a day (BID) | ORAL | 0 refills | Status: AC

## 2021-02-23 NOTE — Progress Notes (Signed)
Virtual Visit via Video Note   This visit type was conducted due to national recommendations for restrictions regarding the COVID-19 Pandemic (e.g. social distancing) in an effort to limit this patient's exposure and mitigate transmission in our community.  Due to her co-morbid illnesses, this patient is at least at moderate risk for complications without adequate follow up.  This format is felt to be most appropriate for this patient at this time.  All issues noted in this document were discussed and addressed.  A limited physical exam was performed with this format.  A verbal consent was obtained for the virtual visit.   Date:  03/18/2021   ID:  Audrey Ruiz, DOB Jul 06, 1950, MRN [id number]  Patient Location: Home Provider Location: Office/Clinic  PCP:  [name], MD   Evaluation Performed:  acute  Chief Complaint:  covid 19   History of Present Illness:    Audrey Ruiz is a 71 y.o. female with cough, congestion, headache x 2 days. Little ache. Appetite is ok. No fever, sore throat. Positive covid 19. Tested at home.   Past Medical History:  Diagnosis Date   Abnormal nuclear stress test 08/08/2020   Abnormal nuclear stress test 08/08/2020   Anxiety    Closed fracture of distal end of left radius 08/16/2020   Depressed left ventricular ejection fraction    Depression    Diabetes mellitus without complication ([place])    Dilated cardiomyopathy ([place]) 08/08/2020   Encounter for Medicare annual wellness exam 10/19/2019   Gastroesophageal reflux disease 10/19/2019   GERD (gastroesophageal reflux disease)    Hyperlipidemia    Hyperlipidemia associated with type 2 diabetes mellitus ([place]) 10/19/2019   LBBB (left bundle branch block) 08/17/2020   Mitral valve prolapse    Nonischemic cardiomyopathy ([place]) 08/17/2020   Osteopenia 10/19/2019   Sinusitis 08/16/2019    Past Surgical History:  Procedure Laterality Date   BIV ICD INSERTION CRT-D N/A 02/05/2021    Procedure: BIV ICD INSERTION CRT-D;  Surgeon: [name], MD;  Location: [facility];  Service: Cardiovascular;  Laterality: N/A;   BREAST LUMPECTOMY Left 2010   LEFT HEART CATH AND CORONARY ANGIOGRAPHY N/A 08/08/2020   Procedure: LEFT HEART CATH AND CORONARY ANGIOGRAPHY;  Surgeon: Leonie Man, MD;  Location: [facility];  Service: Cardiovascular;  Laterality: N/A;    Family History  Problem Relation Age of Onset   Osteoporosis Mother    Coronary artery disease Father    Diabetes Sister    Breast cancer Daughter    Diabetes Maternal Grandfather     Social History   Socioeconomic History   Marital status: Married    Spouse name: [name]   Number of children: 2   Years of education: Not on file   Highest education level: Master's degree (e.g., MA, MS, MEng, MEd, MSW, MBA)  Occupational History   Occupation: Retired  Tobacco Use   Smoking status: Never   Smokeless tobacco: Never  Vaping Use   Vaping Use: Never used  Substance and Sexual Activity   Alcohol use: Yes    Alcohol/week: 2.0 standard drinks    Types: 2 Glasses of wine per week    Comment: occasional (white wine)   Drug use: Never   Sexual activity: Not Currently  Other Topics Concern   Not on file  Social History Narrative   Son lives in [place], Daughter lives in [place]   Patient 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 mother and father as  well as two aunts   Social Determinants of Radio broadcast assistant Strain: Not on file  Food Insecurity: No Food Insecurity   Worried About Charity fundraiser in the Last Year: Never true   Arboriculturist in the Last Year: Never true  Transportation Needs: No Transportation Needs   Lack of Transportation (Medical): No   Lack of Transportation (Non-Medical): No  Physical Activity: Sufficiently Active   Days of Exercise per Week: 6 days   Minutes of Exercise per Session: 30 min  Stress: Not on file  Social Connections: Not on file  Intimate Partner  Violence: Not At Risk   Fear of Current or Ex-Partner: No   Emotionally Abused: No   Physically Abused: No   Sexually Abused: No    Outpatient Medications Prior to Visit  Medication Sig Dispense Refill   cetirizine (ZYRTEC) 10 MG tablet Take 10 mg by mouth daily.     ezetimibe (ZETIA) 10 MG tablet Take 1 tablet by mouth once daily 90 tablet 0   FLUoxetine (PROZAC) 20 MG capsule Take 1 capsule by mouth once daily 90 capsule 0   fluticasone (FLONASE) 50 MCG/ACT nasal spray Use 2 spray(s) in each nostril once daily 48 g 0   Glucosamine-Chondroitin-MSM 1500-1200-500 MG PACK Take 1 tablet by mouth daily.     ibuprofen (ADVIL) 200 MG tablet Take 200 mg by mouth every 8 (eight) hours as needed for moderate pain.     LORazepam (ATIVAN) 0.5 MG tablet Take 1 tablet (0.5 mg total) by mouth at bedtime as needed for anxiety. 30 tablet 1   Melatonin 10 MG CAPS Take 10 mg by mouth at bedtime.     metoprolol succinate (TOPROL XL) 25 MG 24 hr tablet Take 0.5 tablets (12.5 mg total) by mouth daily. 45 tablet 3   Multiple Vitamin (MULTIVITAMIN) capsule Take 1 capsule by mouth daily.     Omega-3 Fatty Acids (FISH OIL) 1000 MG CAPS Take 1,000 mg by mouth 2 (two) times daily.     omeprazole (PRILOSEC) 20 MG capsule Take 1 capsule by mouth once daily 90 capsule 0   rosuvastatin (CRESTOR) 40 MG tablet Take 1 tablet by mouth once daily 90 tablet 0   sacubitril-valsartan (ENTRESTO) 24-26 MG Take 1 tablet by mouth 2 (two) times daily. 30 tablet 0   spironolactone (ALDACTONE) 25 MG tablet Take 12.5 mg by mouth daily.     Vitamin D, Cholecalciferol, 25 MCG (1000 UT) CAPS Take 1,000 Units by mouth daily.     No facility-administered medications prior to visit.    Allergies:   Patient has no known allergies.   Social History   Tobacco Use   Smoking status: Never   Smokeless tobacco: Never  Vaping Use   Vaping Use: Never used  Substance Use Topics   Alcohol use: Yes    Alcohol/week: 2.0 standard drinks     Types: 2 Glasses of wine per week    Comment: occasional (white wine)   Drug use: Never     Review of Systems  Constitutional:  Negative for chills, fever and malaise/fatigue.  HENT:  Positive for congestion. Negative for ear pain, sinus pain and sore throat.   Respiratory:  Positive for cough. Negative for shortness of breath.   Cardiovascular:  Negative for chest pain.  Musculoskeletal:  Positive for myalgias.  Neurological:  Positive for headaches.    Labs/Other Tests and Data Reviewed:    Recent Labs: 08/17/2020: Magnesium 2.0 12/25/2020:  ALT 32; TSH 2.490 01/10/2021: BUN 16; Creatinine, Ser 0.68; Potassium 4.7; Sodium 139 01/16/2021: Hemoglobin 11.9; Platelets 306   Recent Lipid Panel Lab Results  Component Value Date/Time   CHOL 133 12/25/2020 09:14 AM   TRIG 78 12/25/2020 09:14 AM   HDL 73 12/25/2020 09:14 AM   CHOLHDL 1.8 12/25/2020 09:14 AM   LDLCALC 45 12/25/2020 09:14 AM    Wt Readings from Last 3 Encounters:  03/06/21 131 lb (59.4 kg)  02/23/21 126 lb (57.2 kg)  02/15/21 132 lb (59.9 kg)     Objective:    Vital Signs:  Ht 5' 6.5" (1.689 m)    Wt 126 lb (57.2 kg)    BMI 20.03 kg/m    Physical Exam Constitutional:      Appearance: She is ill-appearing.  Neurological:     Mental Status: She is alert.     ASSESSMENT & PLAN:   1. Upper respiratory tract infection due to COVID-19 virus    No orders of the defined types were placed in this encounter.    Meds ordered this encounter  Medications   molnupiravir EUA (LAGEVRIO) 200 mg CAPS capsule    Sig: Take 4 capsules (800 mg total) by mouth 2 (two) times daily for 5 days.    Dispense:  40 capsule    Refill:  0    COVID-19 Education: The signs and symptoms of COVID-19 were discussed with the patient and how to seek care for testing (follow up with PCP or arrange E-visit). The importance of social distancing was discussed today.   I spent 10 minutes dedicated to the care of this patient on the date  of this encounter to include face-to-face time with the patient.  Follow Up:  In Person prn  Signed, [name], MD  03/18/2021 9:21 PM    [place]

## 2021-02-26 ENCOUNTER — Telehealth: Payer: Self-pay

## 2021-02-26 NOTE — Progress Notes (Signed)
Pt appt has been r/s to 03/21/21 at 11 with CPP.  Audrey Ruiz, [place] Pharmacist Assistant  [phone]

## 2021-03-05 NOTE — Progress Notes (Incomplete)
HEART & VASCULAR TRANSITION OF CARE CONSULT NOTE     Referring Physician: Dr. Curt Bears Primary Care: [name], MD Primary Cardiologist: Dr. Harriet Masson   HPI: Referred to clinic by Dr. Curt Bears for heart failure consultation.   71 y/o female w/ h/o chronic systolic heart failure, breast cancer treated w/ Adriamycin, LBBB, T2DM, HLD and MVP.   Echo 6/22 EF 35-40%, GIIDD, RV mildly reduced. LHC 6/22 showed tortuous vessels but minimal CAD.   Repeat Echo 7/22 EF 30-35%, RV normal.   12/22 CRT-D placed. Procedure c/b small-mod hydropneumothorax. Due to sliding lung the only suitable place to aspirate or put chest tube was near her device pocket which could compromise her device stability so no procedure was recommenced. Given stability of her pneumothorax plan, she was discharged home with close pulmonology follow-up. Seen at [facility] on 12/ and was clinically stable and asymptomatic. Repeat CXR showed stable appearance w/ no interval change.      Cardiac Testing  Echo 6/22 EF 35-40%, GIIDD, RV mildly reduced.  Echo 7/22 EF 30-35%, RV normal.  LHC 6/22 showed tortuous vessels but minimal CAD.    Review of Systems: [y] = yes, [ ]  = no   General: Weight gain [ ] ; Weight loss [ ] ; Anorexia [ ] ; Fatigue [ ] ; Fever [ ] ; Chills [ ] ; Weakness [ ]   Cardiac: Chest pain/pressure [ ] ; Resting SOB [ ] ; Exertional SOB [ ] ; Orthopnea [ ] ; Pedal Edema [ ] ; Palpitations [ ] ; Syncope [ ] ; Presyncope [ ] ; Paroxysmal nocturnal dyspnea[ ]   Pulmonary: Cough [ ] ; Wheezing[ ] ; Hemoptysis[ ] ; Sputum [ ] ; Snoring [ ]   GI: Vomiting[ ] ; Dysphagia[ ] ; Melena[ ] ; Hematochezia [ ] ; Heartburn[ ] ; Abdominal pain [ ] ; Constipation [ ] ; Diarrhea [ ] ; BRBPR [ ]   GU: Hematuria[ ] ; Dysuria [ ] ; Nocturia[ ]   Vascular: Pain in legs with walking [ ] ; Pain in feet with lying flat [ ] ; Non-healing sores [ ] ; Stroke [ ] ; TIA [ ] ; Slurred speech [ ] ;  Neuro: Headaches[ ] ; Vertigo[ ] ; Seizures[ ] ; Paresthesias[ ] ;Blurred  vision [ ] ; Diplopia [ ] ; Vision changes [ ]   Ortho/Skin: Arthritis [ ] ; Joint pain [ ] ; Muscle pain [ ] ; Joint swelling [ ] ; Back Pain [ ] ; Rash [ ]   Psych: Depression[ ] ; Anxiety[ ]   Heme: Bleeding problems [ ] ; Clotting disorders [ ] ; Anemia [ ]   Endocrine: Diabetes [ ] ; Thyroid dysfunction[ ]    Past Medical History:  Diagnosis Date   Abnormal nuclear stress test 08/08/2020   Abnormal nuclear stress test 08/08/2020   Anxiety    Closed fracture of distal end of left radius 08/16/2020   Depressed left ventricular ejection fraction    Depression    Diabetes mellitus without complication (HCC)    Dilated cardiomyopathy ([place]) 08/08/2020   Encounter for Medicare annual wellness exam 10/19/2019   Gastroesophageal reflux disease 10/19/2019   GERD (gastroesophageal reflux disease)    Hyperlipidemia    Hyperlipidemia associated with type 2 diabetes mellitus ([place]) 10/19/2019   LBBB (left bundle branch block) 08/17/2020   Mitral valve prolapse    Nonischemic cardiomyopathy ([place]) 08/17/2020   Osteopenia 10/19/2019   Sinusitis 08/16/2019    Current Outpatient Medications  Medication Sig Dispense Refill   cetirizine (ZYRTEC) 10 MG tablet Take 10 mg by mouth daily.     ezetimibe (ZETIA) 10 MG tablet Take 1 tablet by mouth once daily 90 tablet 0   FLUoxetine (PROZAC) 20 MG capsule Take 1  capsule by mouth once daily 90 capsule 0   fluticasone (FLONASE) 50 MCG/ACT nasal spray Use 2 spray(s) in each nostril once daily 48 g 0   Glucosamine-Chondroitin-MSM 1500-1200-500 MG PACK Take 1 tablet by mouth daily.     ibuprofen (ADVIL) 200 MG tablet Take 200 mg by mouth every 8 (eight) hours as needed for moderate pain.     LORazepam (ATIVAN) 0.5 MG tablet Take 1 tablet (0.5 mg total) by mouth at bedtime as needed for anxiety. 30 tablet 1   Melatonin 10 MG CAPS Take 10 mg by mouth at bedtime.     metoprolol succinate (TOPROL XL) 25 MG 24 hr tablet Take 0.5 tablets (12.5 mg total) by mouth daily. 45 tablet 3    Multiple Vitamin (MULTIVITAMIN) capsule Take 1 capsule by mouth daily.     Omega-3 Fatty Acids (FISH OIL) 1000 MG CAPS Take 1,000 mg by mouth 2 (two) times daily.     omeprazole (PRILOSEC) 20 MG capsule Take 1 capsule by mouth once daily 90 capsule 0   rosuvastatin (CRESTOR) 40 MG tablet Take 1 tablet by mouth once daily 90 tablet 0   sacubitril-valsartan (ENTRESTO) 24-26 MG Take 1 tablet by mouth 2 (two) times daily. 30 tablet 0   spironolactone (ALDACTONE) 25 MG tablet Take 12.5 mg by mouth daily.     Vitamin D, Cholecalciferol, 25 MCG (1000 UT) CAPS Take 1,000 Units by mouth daily.     No current facility-administered medications for this visit.    No Known Allergies    Social History   Socioeconomic History   Marital status: Married    Spouse name: [name]   Number of children: 2   Years of education: Not on file   Highest education level: Master's degree (e.g., MA, MS, MEng, MEd, MSW, MBA)  Occupational History   Occupation: Retired  Tobacco Use   Smoking status: Never   Smokeless tobacco: Never  Vaping Use   Vaping Use: Never used  Substance and Sexual Activity   Alcohol use: Yes    Alcohol/week: 2.0 standard drinks    Types: 2 Glasses of wine per week    Comment: occasional (white wine)   Drug use: Never   Sexual activity: Not Currently  Other Topics Concern   Not on file  Social History Narrative   Son lives in [place], Daughter lives in [place]   Patient 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 mother and father as well as two aunts   Social Determinants of Health   Financial Resource Strain: Not on file  Food Insecurity: No Food Insecurity   Worried About Charity fundraiser in the Last Year: Never true   Arboriculturist in the Last Year: Never true  Transportation Needs: No Transportation Needs   Lack of Transportation (Medical): No   Lack of Transportation (Non-Medical): No  Physical Activity: Sufficiently Active   Days of Exercise per Week: 6 days   Minutes of  Exercise per Session: 30 min  Stress: Not on file  Social Connections: Not on file  Intimate Partner Violence: Not At Risk   Fear of Current or Ex-Partner: No   Emotionally Abused: No   Physically Abused: No   Sexually Abused: No      Family History  Problem Relation Age of Onset   Osteoporosis Mother    Coronary artery disease Father    Diabetes Sister    Breast cancer Daughter    Diabetes Maternal Grandfather     There  were no vitals filed for this visit.  PHYSICAL EXAM: General:  Well appearing. No respiratory difficulty HEENT: normal Neck: supple. no JVD. Carotids 2+ bilat; no bruits. No lymphadenopathy or thryomegaly appreciated. Cor: PMI nondisplaced. Regular rate & rhythm. No rubs, gallops or murmurs. Lungs: clear Abdomen: soft, nontender, nondistended. No hepatosplenomegaly. No bruits or masses. Good bowel sounds. Extremities: no cyanosis, clubbing, rash, edema Neuro: alert & oriented x 3, cranial nerves grossly intact. moves all 4 extremities w/o difficulty. Affect pleasant.  ECG:   ASSESSMENT & PLAN:  Chronic Systolic Heart Failure  2. LBBB  3. Pneumothorax     NYHA *** GDMT  Diuretic- BB- Ace/ARB/ARNI MRA SGLT2i    Referred to HFSW (PCP, Medications, Transportation, ETOH Abuse, Drug Abuse, Insurance, Financial ): Yes or No Refer to Pharmacy: Yes or No Refer to Home Health: Yes on No Refer to Advanced Heart Failure Clinic: Yes or no  Refer to General Cardiology: Yes or No  Follow up

## 2021-03-06 ENCOUNTER — Ambulatory Visit (INDEPENDENT_AMBULATORY_CARE_PROVIDER_SITE_OTHER): Payer: HMO | Admitting: Cardiology

## 2021-03-06 ENCOUNTER — Ambulatory Visit (INDEPENDENT_AMBULATORY_CARE_PROVIDER_SITE_OTHER): Payer: HMO

## 2021-03-06 ENCOUNTER — Encounter: Payer: Self-pay | Admitting: Cardiology

## 2021-03-06 ENCOUNTER — Other Ambulatory Visit: Payer: Self-pay

## 2021-03-06 ENCOUNTER — Encounter (HOSPITAL_COMMUNITY): Payer: HMO

## 2021-03-06 VITALS — BP 100/60 | HR 68 | Ht 67.0 in | Wt 131.0 lb

## 2021-03-06 DIAGNOSIS — I447 Left bundle-branch block, unspecified: Secondary | ICD-10-CM

## 2021-03-06 DIAGNOSIS — J40 Bronchitis, not specified as acute or chronic: Secondary | ICD-10-CM | POA: Diagnosis not present

## 2021-03-06 DIAGNOSIS — I428 Other cardiomyopathies: Secondary | ICD-10-CM | POA: Diagnosis not present

## 2021-03-06 DIAGNOSIS — I5022 Chronic systolic (congestive) heart failure: Secondary | ICD-10-CM | POA: Diagnosis not present

## 2021-03-06 DIAGNOSIS — I420 Dilated cardiomyopathy: Secondary | ICD-10-CM | POA: Diagnosis not present

## 2021-03-06 DIAGNOSIS — E785 Hyperlipidemia, unspecified: Secondary | ICD-10-CM

## 2021-03-06 DIAGNOSIS — R7303 Prediabetes: Secondary | ICD-10-CM

## 2021-03-06 DIAGNOSIS — J948 Other specified pleural conditions: Secondary | ICD-10-CM

## 2021-03-06 DIAGNOSIS — E1169 Type 2 diabetes mellitus with other specified complication: Secondary | ICD-10-CM

## 2021-03-06 DIAGNOSIS — J939 Pneumothorax, unspecified: Secondary | ICD-10-CM | POA: Diagnosis not present

## 2021-03-06 NOTE — Progress Notes (Signed)
Cardiology Office Note:    Date:  03/06/2021   ID:  Audrey Ruiz [name], DOB 1951-01-14, MRN [id number]  PCP:  [name], MD  Cardiologist:  Berniece Salines, DO  Electrophysiologist:  None   Referring MD: [name], MD   Chief Complaint  Patient presents with   Follow-up    4 months.    History of Present Illness:    Audrey Ruiz is a 71 y.o. female with a hx of hyperlipidemia, diabetes type 2, depressed ejection fraction 3540% on June 2022, nonischemic cardiomyopathy based on left heart catheterization done in June 2022, breast cancer status postchemotherapy with Adriamycin 10 years ago is here today for follow-up visit.   At her initial visit I recommended the patient get a nuclear stress test and echocardiogram with this both showed depressed ejection fraction.  I sent her for a heart catheterization which was normal without any evidence of coronary artery disease.   At her visit in June 2022, will continue on Entresto and add Aldactone.  At that visit we also discussed planning for repeat echocardiogram in September as we had started discussing resynchronization therapy but unfortunately the patient got this echocardiogram in July which did not show any improvement.  In the interim she did see my partner Dr. [name].   I saw the patient on November 09, 2020 at that time we discussed treatment options and she was agreeable to pursue cardiac resynchronization therapy.  Unfortunately due to her significant low blood pressure we are unable to increase any of her guideline directed medical therapy.  Since I last saw the patient she had BiV ICD on February 06, 2019 the procedure was unfortunately complicated by hydropneumothorax.  She seems to have recovered well.  She has had 2 chest x-rays showing stability with minimal symptoms.  She has followed up with our EP partners as well as pulmonary.  Today she tells me that she is experienced some difference in her  symptoms.  With some improvement.   Past Medical History:  Diagnosis Date   Abnormal nuclear stress test 08/08/2020   Abnormal nuclear stress test 08/08/2020   Anxiety    Closed fracture of distal end of left radius 08/16/2020   Depressed left ventricular ejection fraction    Depression    Diabetes mellitus without complication ([place])    Dilated cardiomyopathy ([place]) 08/08/2020   Encounter for Medicare annual wellness exam 10/19/2019   Gastroesophageal reflux disease 10/19/2019   GERD (gastroesophageal reflux disease)    Hyperlipidemia    Hyperlipidemia associated with type 2 diabetes mellitus ([place]) 10/19/2019   LBBB (left bundle branch block) 08/17/2020   Mitral valve prolapse    Nonischemic cardiomyopathy ([place]) 08/17/2020   Osteopenia 10/19/2019   Sinusitis 08/16/2019    Past Surgical History:  Procedure Laterality Date   BIV ICD INSERTION CRT-D N/A 02/05/2021   Procedure: BIV ICD INSERTION CRT-D;  Surgeon: [name], MD;  Location: [facility];  Service: Cardiovascular;  Laterality: N/A;   BREAST LUMPECTOMY Left 2010   LEFT HEART CATH AND CORONARY ANGIOGRAPHY N/A 08/08/2020   Procedure: LEFT HEART CATH AND CORONARY ANGIOGRAPHY;  Surgeon: Leonie Man, MD;  Location: [facility];  Service: Cardiovascular;  Laterality: N/A;    Current Medications: Current Meds  Medication Sig   cetirizine (ZYRTEC) 10 MG tablet Take 10 mg by mouth daily.   ezetimibe (ZETIA) 10 MG tablet Take 1 tablet by mouth once daily   FLUoxetine (PROZAC) 20 MG  capsule Take 1 capsule by mouth once daily   fluticasone (FLONASE) 50 MCG/ACT nasal spray Use 2 spray(s) in each nostril once daily   Glucosamine-Chondroitin-MSM 1500-1200-500 MG PACK Take 1 tablet by mouth daily.   ibuprofen (ADVIL) 200 MG tablet Take 200 mg by mouth every 8 (eight) hours as needed for moderate pain.   LORazepam (ATIVAN) 0.5 MG tablet Take 1 tablet (0.5 mg total) by mouth at bedtime as needed for anxiety.    Melatonin 10 MG CAPS Take 10 mg by mouth at bedtime.   metoprolol succinate (TOPROL XL) 25 MG 24 hr tablet Take 0.5 tablets (12.5 mg total) by mouth daily.   Multiple Vitamin (MULTIVITAMIN) capsule Take 1 capsule by mouth daily.   Omega-3 Fatty Acids (FISH OIL) 1000 MG CAPS Take 1,000 mg by mouth 2 (two) times daily.   omeprazole (PRILOSEC) 20 MG capsule Take 1 capsule by mouth once daily   rosuvastatin (CRESTOR) 40 MG tablet Take 1 tablet by mouth once daily   sacubitril-valsartan (ENTRESTO) 24-26 MG Take 1 tablet by mouth 2 (two) times daily.   spironolactone (ALDACTONE) 25 MG tablet Take 12.5 mg by mouth daily.   Vitamin D, Cholecalciferol, 25 MCG (1000 UT) CAPS Take 1,000 Units by mouth daily.     Allergies:   Patient has no known allergies.   Social History   Socioeconomic History   Marital status: Married    Spouse name: Audrey Ruiz   Number of children: 2   Years of education: Not on file   Highest education level: Master's degree (e.g., MA, MS, MEng, MEd, MSW, MBA)  Occupational History   Occupation: Retired  Tobacco Use   Smoking status: Never   Smokeless tobacco: Never  Vaping Use   Vaping Use: Never used  Substance and Sexual Activity   Alcohol use: Yes    Alcohol/week: 2.0 standard drinks    Types: 2 Glasses of wine per week    Comment: occasional (white wine)   Drug use: Never   Sexual activity: Not Currently  Other Topics Concern   Not on file  Social History Narrative   Son lives in [place], Daughter lives in [place]   Patient 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 mother and father as well as two aunts   Social Determinants of Health   Financial Resource Strain: Not on file  Food Insecurity: No Food Insecurity   Worried About Charity fundraiser in the Last Year: Never true   Arboriculturist in the Last Year: Never true  Transportation Needs: No Transportation Needs   Lack of Transportation (Medical): No   Lack of Transportation (Non-Medical): No  Physical Activity:  Sufficiently Active   Days of Exercise per Week: 6 days   Minutes of Exercise per Session: 30 min  Stress: Not on file  Social Connections: Not on file     Family History: The patient's family history includes Breast cancer in her daughter; Coronary artery disease in her father; Diabetes in her maternal grandfather and sister; Osteoporosis in her mother.  ROS:   Review of Systems  Constitution: Negative for decreased appetite, fever and weight gain.  HENT: Negative for congestion, ear discharge, hoarse voice and sore throat.   Eyes: Negative for discharge, redness, vision loss in right eye and visual halos.  Cardiovascular: Negative for chest pain, dyspnea on exertion, leg swelling, orthopnea and palpitations.  Respiratory: Negative for cough, hemoptysis, shortness of breath and snoring.   Endocrine: Negative for heat intolerance and polyphagia.  Hematologic/Lymphatic: Negative for bleeding problem. Does not bruise/bleed easily.  Skin: Negative for flushing, nail changes, rash and suspicious lesions.  Musculoskeletal: Negative for arthritis, joint pain, muscle cramps, myalgias, neck pain and stiffness.  Gastrointestinal: Negative for abdominal pain, bowel incontinence, diarrhea and excessive appetite.  Genitourinary: Negative for decreased libido, genital sores and incomplete emptying.  Neurological: Negative for brief paralysis, focal weakness, headaches and loss of balance.  Psychiatric/Behavioral: Negative for altered mental status, depression and suicidal ideas.  Allergic/Immunologic: Negative for HIV exposure and persistent infections.    EKGs/Labs/Other Studies Reviewed:    The following studies were reviewed today:   EKG:  None today   TTE 09/06/2020 IMPRESSIONS   1. Dysynchronous LV most likely secondary to IVCD. In view of this, EF assessment is not optimal. May consider other modalities for better  assessment.. Left ventricular ejection fraction, by estimation, is 30 to   35%. The left ventricle has moderately  decreased function. The left ventricle has no regional wall motion  abnormalities. Left ventricular diastolic parameters are consistent with  Grade I diastolic dysfunction (impaired relaxation).   2. Right ventricular systolic function is normal. The right ventricular  size is normal. There is normal pulmonary artery systolic pressure.   3. The mitral valve is degenerative. No evidence of mitral valve  regurgitation. No evidence of mitral stenosis.   4. The aortic valve is normal in structure. Aortic valve regurgitation is  not visualized. No aortic stenosis is present.   5. The inferior vena cava is normal in size with greater than 50%  respiratory variability, suggesting right atrial pressure of 3 mmHg.   FINDINGS   Left Ventricle: Dysynchronous LV most likely secondary to IVCD. In view  of this, EF assessment is not optimal. May consider other modalities for  better assessment. Left ventricular ejection fraction, by estimation, is  30 to 35%. The left ventricle has  moderately decreased function. The left ventricle has no regional wall  motion abnormalities. The left ventricular internal cavity size was normal  in size. There is no left ventricular hypertrophy. Left ventricular  diastolic parameters are consistent with   Grade I diastolic dysfunction (impaired relaxation).   Right Ventricle: The right ventricular size is normal. No increase in right ventricular wall thickness. Right ventricular systolic function is  normal. There is normal pulmonary artery systolic pressure. The tricuspid regurgitant velocity is 2.35 m/s, and   with an assumed right atrial pressure of 3 mmHg, the estimated right ventricular systolic pressure is 70.6 mmHg.   Left Atrium: Left atrial size was normal in size.   Right Atrium: Right atrial size was normal in size.   Pericardium: There is no evidence of pericardial effusion.   Mitral Valve: The mitral valve is  degenerative in appearance. No evidence of mitral valve regurgitation. No evidence of mitral valve stenosis.   Tricuspid Valve: The tricuspid valve is normal in structure. Tricuspid valve regurgitation is not demonstrated. No evidence of tricuspid  stenosis.   Aortic Valve: The aortic valve is normal in structure. Aortic valve regurgitation is not visualized. No aortic stenosis is present.   Pulmonic Valve: The pulmonic valve was normal in structure. Pulmonic valve regurgitation is not visualized. No evidence of pulmonic stenosis.   Aorta: The aortic root is normal in size and structure.   Venous: The inferior vena cava is normal in size with greater than 50% respiratory variability, suggesting right atrial pressure of 3 mmHg.   IAS/Shunts: No atrial level shunt detected by color flow  Doppler.   Left heart catheterization August 08, 2020 Angiographically minimal CAD, very tortuous. Proximal LAD appears to have a short subepicardial segment without obvious bridging. Somewhat diminutive but patent. 2 major septal perforators. LV end diastolic pressure is low. There is no aortic valve stenosis.   SUMMARY Angiographically minimal CAD with short subepicardial portion of the proximal LAD that is relatively diminutive, but widely patent.  Very tortuous vessels. Normal to low LVEDP. Nonischemic Cardiomyopathy: question if this is related to Left Bundle Branch Block with False Positive Stress Test related to Left Bundle Branch Block Wall Motion.    RECOMMENDATIONS Continue guideline directed medical therapy - consider use of long active Nitrate based upon the response to IC NTG Consider referral for CRT/BiV pacing.  Recent Labs: 08/17/2020: Magnesium 2.0 12/25/2020: ALT 32; TSH 2.490 01/10/2021: BUN 16; Creatinine, Ser 0.68; Potassium 4.7; Sodium 139 01/16/2021: Hemoglobin 11.9; Platelets 306  Recent Lipid Panel    Component Value Date/Time   CHOL 133 12/25/2020 0914   TRIG 78 12/25/2020  0914   HDL 73 12/25/2020 0914   CHOLHDL 1.8 12/25/2020 0914   LDLCALC 45 12/25/2020 0914    Physical Exam:    VS:  BP 100/60 (BP Location: Left Arm, Patient Position: Sitting, Cuff Size: Normal)    Pulse 68    Ht 5\' 7"  (1.702 m)    Wt 131 lb (59.4 kg)    BMI 20.52 kg/m     Wt Readings from Last 3 Encounters:  03/06/21 131 lb (59.4 kg)  02/23/21 126 lb (57.2 kg)  02/15/21 132 lb (59.9 kg)     GEN: Well nourished, well developed in no acute distress HEENT: Normal NECK: No JVD; No carotid bruits LYMPHATICS: No lymphadenopathy CARDIAC: S1S2 noted,RRR, no murmurs, rubs, gallops RESPIRATORY:  Clear to auscultation without rales, wheezing or rhonchi  ABDOMEN: Soft, non-tender, non-distended, +bowel sounds, no guarding. EXTREMITIES: No edema, No cyanosis, no clubbing MUSCULOSKELETAL:  No deformity  SKIN: Warm and dry NEUROLOGIC:  Alert and oriented x 3, non-focal PSYCHIATRIC:  Normal affect, good insight  ASSESSMENT:    1. Nonischemic cardiomyopathy ([place])   2. Dilated cardiomyopathy ([place])   3. Hyperlipidemia associated with type 2 diabetes mellitus ([place])   4. Prediabetes   5. LBBB (left bundle branch block)   6. Chronic systolic congestive heart failure ([facility])    PLAN:      She is status post BiV ICD.  She appears to be clinically doing well.  She is happy because her symptoms are improving.  Unfortunately her blood pressure is still on the lower side.  He has no room to increase her diuretic directed medical therapy for her cardiomyopathy.  We will continue to monitor the patient.  We will repeat her echocardiogram in 3 months to reassess her LV function post BiV ICD.  If there is no improvement in her EF we will go ahead and have the patient see our advanced heart failure team as well.  The patient is in agreement with the above plan. The patient left the office in stable condition.  The patient will follow up in   Medication Adjustments/Labs and Tests Ordered: Current  medicines are reviewed at length with the patient today.  Concerns regarding medicines are outlined above.  Orders Placed This Encounter  Procedures   ECHOCARDIOGRAM COMPLETE   No orders of the defined types were placed in this encounter.   Patient Instructions  Medication Instructions:  Your physician recommends that you continue on your current medications as  directed. Please refer to the Current Medication list given to you today.  *If you need a refill on your cardiac medications before your next appointment, please call your pharmacy*   Testing/Procedures: Your physician has requested that you have an echocardiogram. Echocardiography is a painless test that uses sound waves to create images of your heart. It provides your doctor with information about the size and shape of your heart and how well your hearts chambers and valves are working. This procedure takes approximately one hour. There are no restrictions for this procedure. To be done in April 2023.    Follow-Up: At [facility], you and your health needs are our priority.  As part of our continuing mission to provide you with exceptional heart care, we have created designated Provider Care Teams.  These Care Teams include your primary Cardiologist (physician) and Advanced Practice Providers (APPs -  Physician Assistants and Nurse Practitioners) who all work together to provide you with the care you need, when you need it.  We recommend signing up for the patient portal called "MyChart".  Sign up information is provided on this After Visit Summary.  MyChart is used to connect with patients for Virtual Visits (Telemedicine).  Patients are able to view lab/test results, encounter notes, upcoming appointments, etc.  Non-urgent messages can be sent to your provider as well.   To learn more about what you can do with MyChart, go to [email or web address].    Your next appointment:   3 month(s)  The format for your next  appointment:   In Person  Provider:   Berniece Salines, DO    Adopting a Healthy Lifestyle.  Know what a healthy weight is for you (roughly BMI <25) and aim to maintain this   Aim for 7+ servings of fruits and vegetables daily   65-80+ fluid ounces of water or unsweet tea for healthy kidneys   Limit to max 1 drink of alcohol per day; avoid smoking/tobacco   Limit animal fats in diet for cholesterol and heart health - choose grass fed whenever available   Avoid highly processed foods, and foods high in saturated/trans fats   Aim for low stress - take time to unwind and care for your mental health   Aim for 150 min of moderate intensity exercise weekly for heart health, and weights twice weekly for bone health   Aim for 7-9 hours of sleep daily   When it comes to diets, agreement about the perfect plan isnt easy to find, even among the experts. Experts at the [employer] developed an idea known as the Healthy Eating Plate. Just imagine a plate divided into logical, healthy portions.   The emphasis is on diet quality:   Load up on vegetables and fruits - one-half of your plate: Aim for color and variety, and remember that potatoes dont count.   Go for whole grains - one-quarter of your plate: Whole wheat, barley, wheat berries, quinoa, oats, brown rice, and foods made with them. If you want pasta, go with whole wheat pasta.   Protein power - one-quarter of your plate: Fish, chicken, beans, and nuts are all healthy, versatile protein sources. Limit red meat.   The diet, however, does go beyond the plate, offering a few other suggestions.   Use healthy plant oils, such as olive, canola, soy, corn, sunflower and peanut. Check the labels, and avoid partially hydrogenated oil, which have unhealthy trans fats.   If youre thirsty, drink water. Coffee and  tea are good in moderation, but skip sugary drinks and limit milk and dairy products to one or two daily  servings.   The type of carbohydrate in the diet is more important than the amount. Some sources of carbohydrates, such as vegetables, fruits, whole grains, and beans-are healthier than others.   Finally, stay active  Signed, Berniece Salines, DO  03/06/2021 2:17 PM    [facility]

## 2021-03-06 NOTE — Patient Instructions (Addendum)
Medication Instructions:  Your physician recommends that you continue on your current medications as directed. Please refer to the Current Medication list given to you today.  *If you need a refill on your cardiac medications before your next appointment, please call your pharmacy*   Testing/Procedures: Your physician has requested that you have an echocardiogram. Echocardiography is a painless test that uses sound waves to create images of your heart. It provides your doctor with information about the size and shape of your heart and how well your hearts chambers and valves are working. This procedure takes approximately one hour. There are no restrictions for this procedure. To be done in April 2023.    Follow-Up: At [facility], you and your health needs are our priority.  As part of our continuing mission to provide you with exceptional heart care, we have created designated Provider Care Teams.  These Care Teams include your primary Cardiologist (physician) and Advanced Practice Providers (APPs -  Physician Assistants and Nurse Practitioners) who all work together to provide you with the care you need, when you need it.  We recommend signing up for the patient portal called "MyChart".  Sign up information is provided on this After Visit Summary.  MyChart is used to connect with patients for Virtual Visits (Telemedicine).  Patients are able to view lab/test results, encounter notes, upcoming appointments, etc.  Non-urgent messages can be sent to your provider as well.   To learn more about what you can do with MyChart, go to [email or web address].    Your next appointment:   3 month(s)  The format for your next appointment:   In Person  Provider:   Berniece Salines, DO

## 2021-03-09 ENCOUNTER — Ambulatory Visit: Payer: PPO | Admitting: Oncology

## 2021-03-09 ENCOUNTER — Encounter (HOSPITAL_COMMUNITY): Payer: HMO

## 2021-03-12 ENCOUNTER — Telehealth: Payer: BC Managed Care – PPO

## 2021-03-15 NOTE — Progress Notes (Signed)
Please let patient know CXR showed interval resolution of left-sided pneumothorax. Stable mild bronchitic changes.

## 2021-03-18 DIAGNOSIS — J069 Acute upper respiratory infection, unspecified: Secondary | ICD-10-CM | POA: Insufficient documentation

## 2021-03-18 HISTORY — DX: Acute upper respiratory infection, unspecified: J06.9

## 2021-03-18 NOTE — Assessment & Plan Note (Signed)
Your COVID test is positive. You should remain isolated and quarantine for at least 5 days from start of symptoms. You must be feeling better and be fever free without any fever reducers for at least 24 hours as well. You should wear a mask at all times when out of your home or around others for 5 days after leaving isolation.  Your household contacts should be tested as well as work contacts. If you feel worse or have increasing shortness of breath, you should be seen in person at urgent care or the emergency room.   Rx: molnupiravir.   COVID-19 home recommendations: Recommend plenty of rest. Recommend drink plenty of fluids. Recommend eat 3 meals a day. Recommend over-the-counter fever medications. Recommend over-the-counter cough and congestion medications.

## 2021-03-21 ENCOUNTER — Telehealth: Payer: BC Managed Care – PPO

## 2021-03-29 ENCOUNTER — Telehealth: Payer: Self-pay | Admitting: Primary Care

## 2021-03-30 NOTE — Telephone Encounter (Signed)
Martyn Ehrich, NP  03/15/2021 10:41 AM EST     Please let patient know CXR showed interval resolution of left-sided pneumothorax. Stable mild bronchitic changes     Called and spoke with pt letting her know the results of cxr and she verbalized understanding. Nothing further needed.

## 2021-03-30 NOTE — Telephone Encounter (Signed)
Patient checking on chest xray results. Patient phone number is [phone] c or [phone] h.

## 2021-04-13 ENCOUNTER — Ambulatory Visit (INDEPENDENT_AMBULATORY_CARE_PROVIDER_SITE_OTHER): Payer: BC Managed Care – PPO

## 2021-04-13 NOTE — Progress Notes (Signed)
Chronic Care Management Pharmacy Note  04/13/2021 Name:  Audrey Ruiz MRN:  [id number] DOB:  1951-01-14   Summary: Pleasant 71 year old female presents for f/u CCM visit. She has a degree in Psych and a Masters in [employer]. 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 Because of this, she was able to travel to [place], [place], and [place] (And all over the country). Her husband worked for a Theatre stage manager. They have 2 children. 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 Audrey Ruiz is working with a Theatre stage manager. Audrey Ruiz takes care of both of her parents which is very stressful. Her husband was also recently Dx with Early Onset Alzheimers which is causing a lot of anxiety as well. She feels overwhelmed taking care of the healthcare of so many people.    Recommendations: Coordinated with team to get DEXA scan Coordinated with [facility] to get [facility], will ask PCP to send in Lorazepam Patient's mental health is worsening, would like to try alternative to Fluoxetine, will ask PCP to send new med in,   Subjective: Audrey Ruiz is an 71 y.o. year old female who is a primary patient of Cox, Kirsten, MD.  The CCM team was consulted for assistance with disease management and care coordination needs.    Engaged with patient face to face for follow up visit in response to provider referral for pharmacy case management and/or care coordination services.   Consent to Services:  The patient was given the following information about Chronic Care Management services today, agreed to services, and gave verbal consent: 1. CCM service includes personalized support from designated clinical staff supervised by the primary care provider, including individualized plan of  care and coordination with other care providers 2. 24/7 contact phone numbers for assistance for urgent and routine care needs. 3. Service will only be billed when office clinical staff spend 20 minutes or more in a month to coordinate care. 4. Only one practitioner may furnish and bill the service in a calendar month. 5.The patient may stop CCM services at any time (effective at the end of the month) by phone call to the office staff. 6. The patient will be responsible for cost sharing (co-pay) of up to 20% of the service fee (after annual deductible is met). Patient agreed to services and consent obtained.  Patient Care Team: [name], MD as PCP - General (Family Medicine) Berniece Salines, DO as PCP - Cardiology (Cardiology) Marice Potter, MD as Consulting Physician (Oncology) Berniece Salines, DO as Consulting Physician (Cardiology) Brynda Peon, [place] as Physician Assistant (Orthopedic Surgery) Lynnell Dike, [place] (Optometry) Lane Hacker, [facility] (Pharmacist)  Recent office visits:  09/21/2020- [name], MD (PCP)- Patient presented for follow up visit on Gastroesophageal reflux disease without esophagitis, Mixed hyperlipidemia, Nonischemic cardiomyopathy ([place]), Prediabetes, Chronic systolic congestive heart failure ([place]), Depression, major, recurrent, mild ([place]), LBBB (left bundle branch block). Fluoxetine changed from 10 mg to 20 mg- 1 capsule daily. Ambulatory Referral to [facility] Coordination placed, CMP, A1C, Lipid panel, Cardiovascular Risk and CBC ordered. Return in about 3 months (around 12/22/2020) for fasting.   06/01/2020- Jerrell Belfast, NP (PCP)- Patient presented for a follow up visit on Mitral valve prolapse, Type 2 Diabetes mellitus, Hyperlipidemia, GERD.  Fluoxetine changed from 20 mg to 10 mg- 1 tablet daily.  A1C, CBC, CMP, TSH ordered. Ambulatory referral to Cardiology placed.    Recent consult visits:  10/06/2020- Gertie Fey, PA ([facility])- Patient  presented for follow up visit for Left wrist pain/ Closed fracture of distal end of left radius.  Left Wrist Xray Ordered. Follow up in 4-6 weeks. (See Media for notes).   8/03/2022Maude Leriche, MD (Cardiology)- Patient presented for follow up visit on Nonischemic cardiomyopathy [facility]), LBBB (left bundle branch block), Diabetes mellitus without complication ([place]), Hyperlipidemia associated with type 2 diabetes mellitus ([place]). MR Cardiac Morphology w wo contrast ordered. No medication changes. Follow up in 1 month.    09/06/2020- Echocardiogram complete performed.   08/17/2020- Berniece Salines, DO (Cardiology)- Patient presented for follow up visit on Nonischemic cardiomyopathy [facility]), Depressed left ventricular ejection fraction, Dilated cardiomyopathy ([place]),             Diabetes mellitus without complication ([place]), Hyperlipidemia associated with type 2 diabetes mellitus ([place]), LBBB (left bundle branch block). Aspirin 81 mg discontinued. Spironolactone 12.5 mg - 1 tablet daily added. Follow up in 3 months or sooner if needed.    08/16/2020- Gertie Fey, PA ([facility])- Notes not available.   08/12/2020- Darien Ramus, MD- Notes not available.   08/08/2020- [name], MD (Cardiology)- LEFT HEART CATH AND CORONARY ANGIOGRAPHY.  Post- op diagnosis: Angiographically minimal CAD with short subendocardial portion of the proximal LAD that is relatively diminutive, but widely patent. Very tortuous vessels. Normal to low LVEDP. Nonischemic Cardiomyopathy-question if this is related to Left Bundle Bran, ch Block with False Positive Stress Test related to Left Bundle Branch Block Wall Motion.   08/01/2020- Godfrey Pick Tobb, DO (Cardiology)- Patient presented for follow up visit on Depressed left ventricular ejection fraction, Dilated cardiomyopathy ([place]), Hyperlipidemia associated with type 2 diabetes mellitus ([place]), Hypertension, unspecified type, Dyspnea on exertion. Entresto 24/26 mg - 1 tablet 2 times  daily added. BMP, CBC, Magnesium ordered. Follow up in 2 weeks.    07/13/2020- Shirlee More, MD (Cardiology)- Notes not available.   06/29/2020- Berniece Salines, DO (Cardiology)- Patient presented for Initial visit on Mitral valve prolapse,   Other chest pain, Hyperlipidemia associated with type 2 diabetes mellitus ([place]), SOB (shortness of breath), Vitamin D deficiency, Fatigue, unspecified type. No medication changes, Vitamin D, Echocardiogram, EKG, Myocardial Perfusion Ordered. Follow up in 3 months.        Hospital visits:  Medication Reconciliation was completed by comparing discharge summary, patients EMR and Pharmacy list, and upon discussion with patient.   Admitted to the hospital on 08/08/2020 due to Dilated Cardiomyopathy. Discharge date was 08/08/2020. Discharged from [place]?Medications Started at [facility] Discharge:?? -started: None   Medication Changes at Hospital Discharge: -Changed: None   Medications Discontinued at Hospital Discharge: -Stopped: None   Medications that remain the same after Hospital Discharge:??  -All other medications will remain the same.     Objective:  Lab Results  Component Value Date   CREATININE 0.68 01/10/2021   BUN 16 01/10/2021   GFRNONAA 90 10/19/2019   GFRAA 104 10/19/2019   NA 139 01/10/2021   K 4.7 01/10/2021   CALCIUM 10.3 01/10/2021   CO2 24 01/10/2021   GLUCOSE 91 01/10/2021    Lab Results  Component Value Date/Time   HGBA1C 5.7 (H) 12/25/2020 09:14 AM   HGBA1C 6.0 (H) 09/21/2020 08:47 AM    Last diabetic Eye exam: No results found  for: HMDIABEYEEXA  Last diabetic Foot exam: No results found for: HMDIABFOOTEX   Lab Results  Component Value Date   CHOL 133 12/25/2020   HDL 73 12/25/2020   LDLCALC 45 12/25/2020   TRIG 78 12/25/2020   CHOLHDL 1.8 12/25/2020    Hepatic Function Latest Ref Rng & Units 12/25/2020 09/21/2020 06/01/2020  Total Protein 6.0 - 8.5 g/dL 6.3 6.1 6.7  Albumin 3.7 - 4.7  g/dL 4.6 4.4 4.3  AST 0 - 40 IU/L 32 21 39  ALT 0 - 32 IU/L 32 16 20  Alk Phosphatase 44 - 121 IU/L 49 64 54  Total Bilirubin 0.0 - 1.2 mg/dL 0.8 0.6 0.9    Lab Results  Component Value Date/Time   TSH 2.490 12/25/2020 09:14 AM   TSH 2.150 06/01/2020 10:21 AM    CBC Latest Ref Rng & Units 01/16/2021 01/10/2021 12/25/2020  WBC 3.4 - 10.8 x10E3/uL 8.0 CANCELED 5.7  Hemoglobin 11.1 - 15.9 g/dL 11.9 - 12.1  Hematocrit 34.0 - 46.6 % 37.1 - 38.7  Platelets 150 - 450 x10E3/uL 306 CANCELED 274    Lab Results  Component Value Date/Time   VD25OH 74.6 06/29/2020 11:17 AM   VD25OH 71.1 10/19/2019 09:14 AM    Clinical ASCVD: No  The 10-year ASCVD risk score (Arnett DK, et al., 2019) is: 16.1%   Values used to calculate the score:     Age: 41 years     Sex: Female     Is Non-Hispanic African American: No     Diabetic: Yes     Tobacco smoker: No     Systolic Blood Pressure: 767 mmHg     Is BP treated: Yes     HDL Cholesterol: 73 mg/dL     Total Cholesterol: 133 mg/dL    Depression screen [facility] 2/9 12/25/2020 10/26/2020 09/21/2020  Decreased Interest 0 0 0  Down, Depressed, Hopeless 0 0 0  PHQ - 2 Score 0 0 0  Altered sleeping 0 - -  Tired, decreased energy 2 - -  Change in appetite 0 - -  Feeling bad or failure about yourself  0 - -  Trouble concentrating 0 - -  Moving slowly or fidgety/restless 1 - -  Suicidal thoughts 0 - -  PHQ-9 Score 3 - -  Difficult doing work/chores Somewhat difficult - -     Other: (CHADS2VASc if Afib, MMRC or CAT for COPD, ACT, DEXA)  Social History   Tobacco Use  Smoking Status Never  Smokeless Tobacco Never   BP Readings from Last 3 Encounters:  03/06/21 100/60  02/15/21 (!) 110/58  02/08/21 102/64   Pulse Readings from Last 3 Encounters:  03/06/21 68  02/15/21 (!) 53  02/08/21 69   Wt Readings from Last 3 Encounters:  03/06/21 131 lb (59.4 kg)  02/23/21 126 lb (57.2 kg)  02/15/21 132 lb (59.9 kg)   BMI Readings from Last 3 Encounters:   03/06/21 20.52 kg/m  02/23/21 20.03 kg/m  02/15/21 20.67 kg/m    Assessment/Interventions: Review of patient past medical history, allergies, medications, health status, including review of consultants reports, laboratory and other test data, was performed as part of comprehensive evaluation and provision of chronic care management services.   SDOH:  (Social Determinants of Health) assessments and interventions performed: Yes SDOH Interventions    Flowsheet Row Most Recent Value  SDOH Interventions   Financial Strain Interventions Intervention Not Indicated  Transportation Interventions Intervention Not Indicated      SDOH Screenings  Alcohol Screen: Low Risk    Last Alcohol Screening Score (AUDIT): 2  Depression (PHQ2-9): Low Risk    PHQ-2 Score: 3  Financial Resource Strain: Low Risk    Difficulty of Paying Living Expenses: Not hard at all  Food Insecurity: No Food Insecurity   Worried About Charity fundraiser in the Last Year: Never true   Ran Out of Food in the Last Year: Never true  Housing: Low Risk    Last Housing Risk Score: 0  Physical Activity: Sufficiently Active   Days of Exercise per Week: 6 days   Minutes of Exercise per Session: 30 min  Social Connections: Not on file  Stress: Not on file  Tobacco Use: Low Risk    Smoking Tobacco Use: Never   Smokeless Tobacco Use: Never   Passive Exposure: Not on file  Transportation Needs: No Transportation Needs   Lack of Transportation (Medical): No   Lack of Transportation (Non-Medical): No    CCM Care Plan  No Known Allergies  Medications Reviewed Today     Reviewed by Lane Hacker, [facility] (Pharmacist) on 04/13/21 at 1013  Med List Status: <None>   Medication Order Taking? Sig Documenting Provider Last Dose Status Informant  cetirizine (ZYRTEC) 10 MG tablet [id number] Yes Take 10 mg by mouth daily. [provider] Taking Active Self  ezetimibe (ZETIA) 10 MG tablet [id number]  Take 1 tablet by  mouth once daily Cox, Kirsten, MD  Active   FLUoxetine (PROZAC) 20 MG capsule [id number]  Take 1 capsule by mouth once daily Cox, Kirsten, MD  Active Self  fluticasone Asencion Islam) 50 MCG/ACT nasal spray [id number] Yes Use 2 spray(s) in each nostril once daily [name], MD Taking Active Self  Glucosamine-Chondroitin-MSM 1500-1200-500 MG PACK [id number]  Take 1 tablet by mouth daily. [provider]  Active Self  ibuprofen (ADVIL) 200 MG tablet [id number]  Take 200 mg by mouth every 8 (eight) hours as needed for moderate pain. [provider]  Active Self  LORazepam (ATIVAN) 0.5 MG tablet [id number]  Take 1 tablet (0.5 mg total) by mouth at bedtime as needed for anxiety. [name], MD  Active Self  Melatonin 10 MG CAPS [id number]  Take 10 mg by mouth at bedtime. [provider]  Active Self  metoprolol succinate (TOPROL XL) 25 MG 24 hr tablet [id number] Yes Take 0.5 tablets (12.5 mg total) by mouth daily. Berniece Salines, DO Taking Active Self  Multiple Vitamin (MULTIVITAMIN) capsule [id number]  Take 1 capsule by mouth daily. [provider]  Active Self  Omega-3 Fatty Acids (FISH OIL) 1000 MG CAPS [id number]  Take 1,000 mg by mouth 2 (two) times daily. [provider]  Active Self  omeprazole (PRILOSEC) 20 MG capsule [id number] Yes Take 1 capsule by mouth once daily Cox, Kirsten, MD Taking Active   rosuvastatin (CRESTOR) 40 MG tablet [id number] Yes Take 1 tablet by mouth once daily Cox, Kirsten, MD Taking Active   sacubitril-valsartan (ENTRESTO) 24-26 MG [id number]  Take 1 tablet by mouth 2 (two) times daily. Tobb, Kardie, DO  Active Self  spironolactone (ALDACTONE) 25 MG tablet [id number]  Take 12.5 mg by mouth daily. [provider]  Active Self  Vitamin D, Cholecalciferol, 25 MCG (1000 UT) CAPS [id number]  Take 1,000 Units by mouth daily. [provider]  Active Self            Patient Active Problem List   Diagnosis Date Noted   Upper  respiratory tract infection due to  COVID-19 virus 03/18/2021   Hydropneumothorax 02/08/2021   CHF (congestive heart failure) ([place]) 02/05/2021   Prediabetes 84/53/6468   Chronic systolic congestive heart failure ([place]) 12/25/2020   Depression, major, recurrent, mild ([place]) 12/25/2020   Ganglion cyst of right foot 12/25/2020   Nonischemic cardiomyopathy ([place]) 08/17/2020   LBBB (left bundle branch block) 08/17/2020   Closed fracture of distal end of left radius 08/16/2020   Dilated cardiomyopathy ([place]) 08/08/2020   Hyperlipidemia    Mitral valve prolapse    Encounter for Medicare annual wellness exam 10/19/2019   Gastroesophageal reflux disease 10/19/2019   Hyperlipidemia associated with type 2 diabetes mellitus ([place]) 10/19/2019   Osteopenia 10/19/2019    Immunization History  Administered Date(s) Administered   Fluad Quad(high Dose 65+) 10/07/2018, 10/20/2019, 10/19/2020   Influenza, High Dose Seasonal PF 12/02/2017   Influenza, Seasonal, Injecte, Preservative Fre 11/22/2013   Influenza,inj,Quad PF,6-35 Mos 11/26/2012   Influenza-Unspecified 11/27/2009, 11/22/2010, 10/28/2011, 11/21/2014, 12/14/2015, 10/17/2016   Moderna Sars-Covid-2 Vaccination 04/16/2019, 05/17/2019, 01/17/2020   Pfizer Covid-19 Vaccine Bivalent Booster 53yr & up 12/25/2020   Pneumococcal Conjugate-13 12/22/2014   Pneumococcal Polysaccharide-23 03/22/2010, 10/19/2020   Tdap 11/30/2012    Conditions to be addressed/monitored:  Hyperlipidemia, Diabetes, GERD, and Depression  There are no care plans that you recently modified to display for this patient.     Medication Assistance: None required.  Patient affirms current coverage meets needs.  Compliance/Adherence/Medication fill history: Care Gaps: FOOT EXAM- Overdue - never done  OPHTHALMOLOGY EXAM (Yearly)  -Never done  Hepatitis C Screening (Once)- Never done Zoster Vaccines- Shingrix (1 of 2)- Never done PNA vac Low Risk Adult (2 of 2 - PPSV23)- Last  completed: Dec 22, 2014 COVID-19 Vaccine (4 - Booster for [employer])- Last completed: Jan 17, 2020 INFLUENZA VACCINE (Every 8 Months, August to March)- Last completed: Oct 20, 2019 Annual Wellness Exam scheduled for 10/26/2020.   Star Rating Drugs: Rosuvastatin 40 mg- [facility]   [facility] [place] [address]Phone: [phone]: [phone]  Uses pill box? Yes Pt endorses good compliance  We discussed: Benefits of medication synchronization, packaging and delivery as well as enhanced pharmacist oversight with [facility]. Patient decided to: Utilize [facility] pharmacy for medication synchronization, packaging and delivery -Verbal consent obtained for [facility] enhanced pharmacy services (medication synchronization, adherence packaging, delivery coordination). A medication sync plan was created to allow patient to get all medications delivered once every 30 to 90 days per patient preference. Patient understands they have freedom to choose pharmacy and clinical pharmacist will coordinate care between all prescribers and [facility].   Care Plan and Follow Up Patient Decision:  Patient agrees to Care Plan and Follow-up.  Plan: Telephone follow up appointment with care management team member scheduled for:  01/2021

## 2021-04-13 NOTE — Patient Instructions (Signed)
Visit Information   Goals Addressed   None    There are no care plans to display for this patient.   Audrey Ruiz was given information about Chronic Care Management services today including:  CCM service includes personalized support from designated clinical staff supervised by her physician, including individualized plan of care and coordination with other care providers 24/7 contact phone numbers for assistance for urgent and routine care needs. Standard insurance, coinsurance, copays and deductibles apply for chronic care management only during months in which we provide at least 20 minutes of these services. Most insurances cover these services at 100%, however patients may be responsible for any copay, coinsurance and/or deductible if applicable. This service may help you avoid the need for more expensive face-to-face services. Only one practitioner may furnish and bill the service in a calendar month. The patient may stop CCM services at any time (effective at the end of the month) by phone call to the office staff.  Patient agreed to services and verbal consent obtained.   The patient verbalized understanding of instructions, educational materials, and care plan provided today and declined offer to receive copy of patient instructions, educational materials, and care plan.  The pharmacy team will reach out to the patient again over the next 60 days.   Lane Hacker, [place]

## 2021-04-16 ENCOUNTER — Other Ambulatory Visit: Payer: Self-pay

## 2021-04-16 DIAGNOSIS — I341 Nonrheumatic mitral (valve) prolapse: Secondary | ICD-10-CM

## 2021-04-16 DIAGNOSIS — R9430 Abnormal result of cardiovascular function study, unspecified: Secondary | ICD-10-CM

## 2021-04-16 MED ORDER — SPIRONOLACTONE 25 MG PO TABS: 12.5000 mg | ORAL_TABLET | Freq: Every day | ORAL | 3 refills | Status: DC

## 2021-04-16 MED ORDER — EZETIMIBE 10 MG PO TABS: 10.0000 mg | ORAL_TABLET | Freq: Every day | ORAL | 3 refills | Status: DC

## 2021-04-16 MED ORDER — METOPROLOL SUCCINATE ER 25 MG PO TB24: 12.5000 mg | ORAL_TABLET | Freq: Every day | ORAL | 3 refills | Status: DC

## 2021-04-16 MED ORDER — ROSUVASTATIN CALCIUM 40 MG PO TABS: 40.0000 mg | ORAL_TABLET | Freq: Every day | ORAL | 3 refills | Status: DC

## 2021-04-16 MED ORDER — ENTRESTO 24-26 MG PO TABS: 1.0000 | ORAL_TABLET | Freq: Two times a day (BID) | ORAL | 3 refills | Status: DC

## 2021-04-16 NOTE — Progress Notes (Signed)
Refills send to [facility].

## 2021-04-17 ENCOUNTER — Other Ambulatory Visit: Payer: Self-pay | Admitting: Family Medicine

## 2021-04-17 DIAGNOSIS — K219 Gastro-esophageal reflux disease without esophagitis: Secondary | ICD-10-CM

## 2021-04-17 DIAGNOSIS — F330 Major depressive disorder, recurrent, mild: Secondary | ICD-10-CM

## 2021-04-17 DIAGNOSIS — E782 Mixed hyperlipidemia: Secondary | ICD-10-CM

## 2021-04-17 MED ORDER — OMEPRAZOLE 20 MG PO CPDR: 20.0000 mg | DELAYED_RELEASE_CAPSULE | Freq: Every day | ORAL | 3 refills | Status: DC

## 2021-04-17 MED ORDER — FLUTICASONE PROPIONATE 50 MCG/ACT NA SUSP: NASAL | 3 refills | Status: DC

## 2021-04-18 ENCOUNTER — Telehealth: Payer: Self-pay

## 2021-04-18 NOTE — Chronic Care Management (AMB) (Signed)
? ? ?  Chronic Care Management ?Pharmacy Assistant  ? ?Name: Audrey Ruiz  MRN: [id number] DOB: 12-14-1950 ? ?Reason for Encounter: Prior Authorization ?  ?04/18/2021- Prior Authorization started for Entresto through Covermymeds. Unable to move to clinical questions, per [place] Medicare Prior Authorization not required for patient/medication. Checking with [facility] to see if they are running as brand name or generic. ? ?Per [facility], insurance information just needed to be updated, they were able to process Entresto with no copay.  ? ? ?Medications: ?Outpatient Encounter Medications as of 04/18/2021  ?Medication Sig  ? cetirizine (ZYRTEC) 10 MG tablet Take 10 mg by mouth daily.  ? ezetimibe (ZETIA) 10 MG tablet Take 1 tablet (10 mg total) by mouth daily.  ? FLUoxetine (PROZAC) 20 MG capsule Take 1 capsule by mouth once daily  ? fluticasone (FLONASE) 50 MCG/ACT nasal spray Use 2 spray(s) in each nostril once daily  ? Glucosamine-Chondroitin-MSM 1500-1200-500 MG PACK Take 1 tablet by mouth daily.  ? ibuprofen (ADVIL) 200 MG tablet Take 200 mg by mouth every 8 (eight) hours as needed for moderate pain.  ? LORazepam (ATIVAN) 0.5 MG tablet Take 1 tablet (0.5 mg total) by mouth at bedtime as needed for anxiety.  ? Melatonin 10 MG CAPS Take 10 mg by mouth at bedtime.  ? metoprolol succinate (TOPROL XL) 25 MG 24 hr tablet Take 0.5 tablets (12.5 mg total) by mouth daily.  ? Multiple Vitamin (MULTIVITAMIN) capsule Take 1 capsule by mouth daily.  ? Omega-3 Fatty Acids (FISH OIL) 1000 MG CAPS Take 1,000 mg by mouth 2 (two) times daily.  ? omeprazole (PRILOSEC) 20 MG capsule Take 1 capsule (20 mg total) by mouth daily.  ? rosuvastatin (CRESTOR) 40 MG tablet Take 1 tablet (40 mg total) by mouth daily.  ? sacubitril-valsartan (ENTRESTO) 24-26 MG Take 1 tablet by mouth 2 (two) times daily.  ? spironolactone (ALDACTONE) 25 MG tablet Take 0.5 tablets (12.5 mg total) by mouth daily.  ?  Vitamin D, Cholecalciferol, 25 MCG (1000 UT) CAPS Take 1,000 Units by mouth daily.  ? ?No facility-administered encounter medications on file as of 04/18/2021.  ? ? ?Pattricia Boss, CMA ?Clinical Pharmacist Assistant ?[phone] ? ?

## 2021-04-22 ENCOUNTER — Other Ambulatory Visit: Payer: Self-pay | Admitting: Family Medicine

## 2021-04-22 MED ORDER — LORAZEPAM 0.5 MG PO TABS: 0.5000 mg | ORAL_TABLET | Freq: Every evening | ORAL | 2 refills | Status: DC | PRN

## 2021-04-23 ENCOUNTER — Telehealth: Payer: Self-pay

## 2021-04-23 ENCOUNTER — Other Ambulatory Visit: Payer: Self-pay

## 2021-04-23 MED ORDER — FLUOXETINE HCL 40 MG PO CAPS: 40.0000 mg | ORAL_CAPSULE | Freq: Every day | ORAL | 1 refills | Status: DC

## 2021-04-23 NOTE — Telephone Encounter (Signed)
PCP asked me to talk with patient about increasing Fluoxetine '20mg'$ ->'40mg'$ . Patient was in favor of this. Let PCP know and asked [facility] to send in script ? ?Patient counseled that this will take about 6 weeks to see and effect and will call me end of April to let me know either way if it helped or not ?

## 2021-05-07 DIAGNOSIS — T1512XA Foreign body in conjunctival sac, left eye, initial encounter: Secondary | ICD-10-CM | POA: Diagnosis not present

## 2021-05-08 ENCOUNTER — Ambulatory Visit (INDEPENDENT_AMBULATORY_CARE_PROVIDER_SITE_OTHER): Payer: HMO

## 2021-05-08 DIAGNOSIS — I428 Other cardiomyopathies: Secondary | ICD-10-CM | POA: Diagnosis not present

## 2021-05-09 LAB — CUP PACEART REMOTE DEVICE CHECK
Battery Remaining Longevity: 56 mo
Battery Remaining Percentage: 93 %
Battery Voltage: 2.98 V
Brady Statistic AP VP Percent: 1.2 %
Brady Statistic AP VS Percent: 1 %
Brady Statistic AS VP Percent: 98 %
Brady Statistic AS VS Percent: 1 %
Brady Statistic RA Percent Paced: 1.3 %
Date Time Interrogation Session: 20230321030011
HighPow Impedance: 57 Ohm
Implantable Lead Implant Date: 20221219
Implantable Lead Implant Date: 20221219
Implantable Lead Implant Date: 20221219
Implantable Lead Location: 753858
Implantable Lead Location: 753859
Implantable Lead Location: 753860
Implantable Pulse Generator Implant Date: 20221219
Lead Channel Impedance Value: 410 Ohm
Lead Channel Impedance Value: 430 Ohm
Lead Channel Impedance Value: 640 Ohm
Lead Channel Pacing Threshold Amplitude: 0.75 V
Lead Channel Pacing Threshold Amplitude: 0.75 V
Lead Channel Pacing Threshold Amplitude: 1 V
Lead Channel Pacing Threshold Pulse Width: 0.5 ms
Lead Channel Pacing Threshold Pulse Width: 0.5 ms
Lead Channel Pacing Threshold Pulse Width: 0.5 ms
Lead Channel Sensing Intrinsic Amplitude: 12 mV
Lead Channel Sensing Intrinsic Amplitude: 2.7 mV
Lead Channel Setting Pacing Amplitude: 3.5 V
Lead Channel Setting Pacing Amplitude: 3.5 V
Lead Channel Setting Pacing Amplitude: 3.5 V
Lead Channel Setting Pacing Pulse Width: 0.5 ms
Lead Channel Setting Pacing Pulse Width: 0.5 ms
Lead Channel Setting Sensing Sensitivity: 0.5 mV
Pulse Gen Serial Number: 111053351

## 2021-05-14 ENCOUNTER — Encounter: Payer: Self-pay | Admitting: Cardiology

## 2021-05-14 ENCOUNTER — Ambulatory Visit (INDEPENDENT_AMBULATORY_CARE_PROVIDER_SITE_OTHER): Payer: HMO | Admitting: Cardiology

## 2021-05-14 ENCOUNTER — Other Ambulatory Visit: Payer: Self-pay

## 2021-05-14 VITALS — BP 90/52 | HR 67 | Ht 67.0 in | Wt 132.0 lb

## 2021-05-14 DIAGNOSIS — I5022 Chronic systolic (congestive) heart failure: Secondary | ICD-10-CM | POA: Diagnosis not present

## 2021-05-14 LAB — CUP PACEART INCLINIC DEVICE CHECK
Battery Remaining Longevity: 91 mo
Brady Statistic RA Percent Paced: 1.2 %
Brady Statistic RV Percent Paced: 99 %
Date Time Interrogation Session: 20230327160342
HighPow Impedance: 58.5 Ohm
Implantable Lead Implant Date: 20221219
Implantable Lead Implant Date: 20221219
Implantable Lead Implant Date: 20221219
Implantable Lead Location: 753858
Implantable Lead Location: 753859
Implantable Lead Location: 753860
Implantable Pulse Generator Implant Date: 20221219
Lead Channel Impedance Value: 412.5 Ohm
Lead Channel Impedance Value: 425 Ohm
Lead Channel Impedance Value: 637.5 Ohm
Lead Channel Pacing Threshold Amplitude: 0.625 V
Lead Channel Pacing Threshold Amplitude: 0.75 V
Lead Channel Pacing Threshold Amplitude: 0.75 V
Lead Channel Pacing Threshold Amplitude: 0.75 V
Lead Channel Pacing Threshold Amplitude: 1.25 V
Lead Channel Pacing Threshold Amplitude: 1.5 V
Lead Channel Pacing Threshold Pulse Width: 0.5 ms
Lead Channel Pacing Threshold Pulse Width: 0.5 ms
Lead Channel Pacing Threshold Pulse Width: 0.5 ms
Lead Channel Pacing Threshold Pulse Width: 0.5 ms
Lead Channel Pacing Threshold Pulse Width: 0.5 ms
Lead Channel Pacing Threshold Pulse Width: 0.5 ms
Lead Channel Sensing Intrinsic Amplitude: 1.7 mV
Lead Channel Sensing Intrinsic Amplitude: 12 mV
Lead Channel Setting Pacing Amplitude: 1.625
Lead Channel Setting Pacing Amplitude: 2 V
Lead Channel Setting Pacing Amplitude: 2 V
Lead Channel Setting Pacing Pulse Width: 0.5 ms
Lead Channel Setting Pacing Pulse Width: 0.5 ms
Lead Channel Setting Sensing Sensitivity: 0.5 mV
Pulse Gen Serial Number: 111053351

## 2021-05-14 NOTE — Progress Notes (Signed)
? ?Electrophysiology Office Note ? ? ?Date:  05/14/2021  ? ?ID:  Audrey Ruiz, DOB 1950/02/28, MRN [id number] ? ?PCP:  Audrey Brome, MD  ?Cardiologist:  Audrey Ruiz ?Primary Electrophysiologist:  Audrey Siebenaler Meredith Leeds, MD   ? ?Chief Complaint: CHF ?  ?History of Present Illness: ?Audrey Ruiz is a 71 y.o. female who is being seen today for the evaluation of CHF at the request of Cox, Kirsten, MD. Presenting today for electrophysiology evaluation. ? ?She has a history significant for hyperlipidemia, type 2 diabetes, nonischemic cardiomyopathy, breast cancer status postchemotherapy.  She received Adriamycin 10 years ago for her breast cancer.  She had a left heart catheterization that showed no evidence of coronary artery disease.  She has been on optimal medical therapy though an echo shows a persistently reduced ejection fraction.  She is now status post Abbott CRT-D implanted 02/05/2021. ? ?Today, denies symptoms of palpitations, chest pain, shortness of breath, orthopnea, PND, lower extremity edema, claudication, dizziness, presyncope, syncope, bleeding, or neurologic sequela. The patient is tolerating medications without difficulties.  Since her device was implanted she has done well.  She does continue to have some days with fatigue, but overall has been doing well.  She states that her shortness of breath has also improved.  She is overall happy with how she has been doing. ? ?Past Medical History:  ?Diagnosis Date  ? Abnormal nuclear stress test 08/08/2020  ? Abnormal nuclear stress test 08/08/2020  ? Anxiety   ? Closed fracture of distal end of left radius 08/16/2020  ? Depressed left ventricular ejection fraction   ? Depression   ? Diabetes mellitus without complication ([place])   ? Dilated cardiomyopathy ([place]) 08/08/2020  ? Encounter for Medicare annual wellness exam 10/19/2019  ? Gastroesophageal reflux disease 10/19/2019  ? GERD (gastroesophageal reflux disease)   ? Hyperlipidemia   ?  Hyperlipidemia associated with type 2 diabetes mellitus ([place]) 10/19/2019  ? LBBB (left bundle branch block) 08/17/2020  ? Mitral valve prolapse   ? Nonischemic cardiomyopathy ([place]) 08/17/2020  ? Osteopenia 10/19/2019  ? Sinusitis 08/16/2019  ? ?Past Surgical History:  ?Procedure Laterality Date  ? BIV ICD INSERTION CRT-D N/A 02/05/2021  ? Procedure: BIV ICD INSERTION CRT-D;  Surgeon: [name], MD;  Location: [facility];  Service: Cardiovascular;  Laterality: N/A;  ? BREAST LUMPECTOMY Left 2010  ? LEFT HEART CATH AND CORONARY ANGIOGRAPHY N/A 08/08/2020  ? Procedure: LEFT HEART CATH AND CORONARY ANGIOGRAPHY;  Surgeon: Leonie Man, MD;  Location: [facility];  Service: Cardiovascular;  Laterality: N/A;  ? ? ? ?Current Outpatient Medications  ?Medication Sig Dispense Refill  ? cetirizine (ZYRTEC) 10 MG tablet Take 10 mg by mouth daily.    ? ezetimibe (ZETIA) 10 MG tablet Take 1 tablet (10 mg total) by mouth daily. 90 tablet 3  ? FLUoxetine (PROZAC) 40 MG capsule Take 1 capsule (40 mg total) by mouth daily. 90 capsule 1  ? fluticasone (FLONASE) 50 MCG/ACT nasal spray Use 2 spray(s) in each nostril once daily 48 g 3  ? Glucosamine-Chondroitin-MSM 1500-1200-500 MG PACK Take 1 tablet by mouth daily.    ? ibuprofen (ADVIL) 200 MG tablet Take 200 mg by mouth every 8 (eight) hours as needed for moderate pain.    ? LORazepam (ATIVAN) 0.5 MG tablet Take 1 tablet (0.5 mg total) by mouth at bedtime as needed for anxiety. 30 tablet 2  ? Melatonin 10 MG CAPS Take 5 mg by mouth at  bedtime.    ? metoprolol succinate (TOPROL XL) 25 MG 24 hr tablet Take 0.5 tablets (12.5 mg total) by mouth daily. 45 tablet 3  ? Multiple Vitamin (MULTIVITAMIN) capsule Take 1 capsule by mouth daily.    ? Omega-3 Fatty Acids (FISH OIL) 1000 MG CAPS Take 1,000 mg by mouth 2 (two) times daily.    ? omeprazole (PRILOSEC) 20 MG capsule Take 1 capsule (20 mg total) by mouth daily. 90 capsule 3  ? rosuvastatin (CRESTOR) 40 MG tablet  Take 1 tablet (40 mg total) by mouth daily. 90 tablet 3  ? sacubitril-valsartan (ENTRESTO) 24-26 MG Take 1 tablet by mouth 2 (two) times daily. 180 tablet 3  ? spironolactone (ALDACTONE) 25 MG tablet Take 0.5 tablets (12.5 mg total) by mouth daily. 45 tablet 3  ? Vitamin D, Cholecalciferol, 25 MCG (1000 UT) CAPS Take 1,000 Units by mouth daily.    ? ?No current facility-administered medications for this visit.  ? ? ?Allergies:   Patient has no known allergies.  ? ?Social History:  The patient  reports that she has never smoked. She has never used smokeless tobacco. She reports current alcohol use of about 2.0 standard drinks per week. She reports that she does not use drugs.  ? ?Family History:  The patient's family history includes Breast cancer in her daughter; Coronary artery disease in her father; Diabetes in her maternal grandfather and sister; Osteoporosis in her mother.  ? ?ROS:  Please see the history of present illness.   Otherwise, review of systems is positive for none.   All other systems are reviewed and negative.  ? ?PHYSICAL EXAM: ?VS:  BP (!) 90/52   Pulse 67   Ht '5\' 7"'$  (1.702 m)   Wt 132 lb (59.9 kg)   SpO2 98%   BMI 20.67 kg/m?  , BMI Body mass index is 20.67 kg/m?. ?GEN: Well nourished, well developed, in no acute distress  ?HEENT: normal  ?Neck: no JVD, carotid bruits, or masses ?Cardiac: RRR; no murmurs, rubs, or gallops,no edema  ?Respiratory:  clear to auscultation bilaterally, normal work of breathing ?GI: soft, nontender, nondistended, + BS ?MS: no deformity or atrophy  ?Skin: warm and dry, device site well healed ?Neuro:  Strength and sensation are intact ?Psych: euthymic mood, full affect ? ?EKG:  EKG is ordered today. ?Personal review of the ekg ordered shows atrial sensed, ventricular paced ? ?Personal review of the device interrogation today. Results in [place]  ? ?Recent Labs: ?08/17/2020: Magnesium 2.0 ?12/25/2020: ALT 32; TSH 2.490 ?01/10/2021: BUN 16; Creatinine, Ser 0.68;  Potassium 4.7; Sodium 139 ?01/16/2021: Hemoglobin 11.9; Platelets 306  ? ? ?Lipid Panel  ?   ?Component Value Date/Time  ? CHOL 133 12/25/2020 0914  ? TRIG 78 12/25/2020 0914  ? HDL 73 12/25/2020 0914  ? CHOLHDL 1.8 12/25/2020 0914  ? LDLCALC 45 12/25/2020 0914  ? ? ? ?Wt Readings from Last 3 Encounters:  ?05/14/21 132 lb (59.9 kg)  ?03/06/21 131 lb (59.4 kg)  ?02/23/21 126 lb (57.2 kg)  ?  ? ? ?Other studies Reviewed: ?Additional studies/ records that were reviewed today include: TTE 09/06/20  ?Review of the above records today demonstrates:  ? 1. Dysynchronous LV most likely secondary to IVCD. In view of this, EF  ?assessment is not optimal. May consider other modalities for better  ?assessment.. Left ventricular ejection fraction, by estimation, is 30 to  ?35%. The left ventricle has moderately  ?decreased function. The left ventricle has no regional wall  motion  ?abnormalities. Left ventricular diastolic parameters are consistent with  ?Grade I diastolic dysfunction (impaired relaxation).  ? 2. Right ventricular systolic function is normal. The right ventricular  ?size is normal. There is normal pulmonary artery systolic pressure.  ? 3. The mitral valve is degenerative. No evidence of mitral valve  ?regurgitation. No evidence of mitral stenosis.  ? 4. The aortic valve is normal in structure. Aortic valve regurgitation is  ?not visualized. No aortic stenosis is present.  ? 5. The inferior vena cava is normal in size with greater than 50%  ?respiratory variability, suggesting right atrial pressure of 3 mmHg.  ? ?[place] 08/08/20 ?Angiographically minimal CAD with short subepicardial portion of the proximal LAD that is relatively diminutive, but widely patent.  Very tortuous vessels. ?Normal to low LVEDP. ?Nonischemic Cardiomyopathy: question if this is related to Left Bundle Branch Block with False Positive Stress Test related to Left Bundle Branch Block Wall Motion ? ? ?ASSESSMENT AND PLAN: ? ?1.  Chronic systolic  heart failure due to ischemic cardiomyopathy: Currently on Entresto 24/26 mg twice daily, Toprol-XL 12.5 mg daily.  She is now status post [place] CRT-D implanted 02/05/2021.  Device functioning appropriately.  No changes at

## 2021-05-14 NOTE — Patient Instructions (Signed)
Medication Instructions:  ?Your physician recommends that you continue on your current medications as directed. Please refer to the Current Medication list given to you today. ? ?*If you need a refill on your cardiac medications before your next appointment, please call your pharmacy* ? ? ?Lab Work: ?None ordered ? ? ?Testing/Procedures: ?None ordered ? ? ?Follow-Up: ?At [facility], you and your health needs are our priority.  As part of our continuing mission to provide you with exceptional heart care, we have created designated Provider Care Teams.  These Care Teams include your primary Cardiologist (physician) and Advanced Practice Providers (APPs -  Physician Assistants and Nurse Practitioners) who all work together to provide you with the care you need, when you need it. ? ? ?Remote monitoring is used to monitor your Pacemaker or ICD from home. This monitoring reduces the number of office visits required to check your device to one time per year. It allows [place] to keep an eye on the functioning of your device to ensure it is working properly. You are scheduled for a device check from home on 08/07/21. You may send your transmission at any time that day. If you have a wireless device, the transmission will be sent automatically. After your physician reviews your transmission, you will receive a postcard with your next transmission date. ? ?Your next appointment:   ?9 month(s) ? ?The format for your next appointment:   ?In Person ? ?Provider:   ?Dr. Curt Bears ? ? ?Thank you for choosing [facility]!! ? ? ?Trinidad Curet, RN ?[phone] ? ? ? ?

## 2021-05-23 NOTE — Progress Notes (Signed)
Remote ICD transmission.   

## 2021-05-30 ENCOUNTER — Ambulatory Visit (INDEPENDENT_AMBULATORY_CARE_PROVIDER_SITE_OTHER): Payer: HMO

## 2021-05-30 DIAGNOSIS — I420 Dilated cardiomyopathy: Secondary | ICD-10-CM

## 2021-05-30 DIAGNOSIS — I428 Other cardiomyopathies: Secondary | ICD-10-CM

## 2021-05-30 LAB — ECHOCARDIOGRAM COMPLETE
Area-P 1/2: 4.15 cm2
S' Lateral: 3.2 cm

## 2021-06-06 ENCOUNTER — Encounter: Payer: Self-pay | Admitting: Cardiology

## 2021-06-06 ENCOUNTER — Ambulatory Visit (INDEPENDENT_AMBULATORY_CARE_PROVIDER_SITE_OTHER): Payer: HMO | Admitting: Cardiology

## 2021-06-06 VITALS — BP 120/68 | HR 75 | Ht 67.0 in | Wt 132.8 lb

## 2021-06-06 DIAGNOSIS — I428 Other cardiomyopathies: Secondary | ICD-10-CM | POA: Diagnosis not present

## 2021-06-06 DIAGNOSIS — E782 Mixed hyperlipidemia: Secondary | ICD-10-CM | POA: Diagnosis not present

## 2021-06-06 DIAGNOSIS — E785 Hyperlipidemia, unspecified: Secondary | ICD-10-CM | POA: Diagnosis not present

## 2021-06-06 DIAGNOSIS — Z95810 Presence of automatic (implantable) cardiac defibrillator: Secondary | ICD-10-CM

## 2021-06-06 DIAGNOSIS — E1169 Type 2 diabetes mellitus with other specified complication: Secondary | ICD-10-CM

## 2021-06-06 DIAGNOSIS — I5022 Chronic systolic (congestive) heart failure: Secondary | ICD-10-CM

## 2021-06-06 DIAGNOSIS — R7303 Prediabetes: Secondary | ICD-10-CM

## 2021-06-06 NOTE — Patient Instructions (Signed)
Medication Instructions:  Your physician recommends that you continue on your current medications as directed. Please refer to the Current Medication list given to you today.  *If you need a refill on your cardiac medications before your next appointment, please call your pharmacy*   Lab Work: None If you have labs (blood work) drawn today and your tests are completely normal, you will receive your results only by: MyChart Message (if you have MyChart) OR A paper copy in the mail If you have any lab test that is abnormal or we need to change your treatment, we will call you to review the results.   Testing/Procedures: None   Follow-Up: At [facility], you and your health needs are our priority.  As part of our continuing mission to provide you with exceptional heart care, we have created designated Provider Care Teams.  These Care Teams include your primary Cardiologist (physician) and Advanced Practice Providers (APPs -  Physician Assistants and Nurse Practitioners) who all work together to provide you with the care you need, when you need it.  We recommend signing up for the patient portal called "MyChart".  Sign up information is provided on this After Visit Summary.  MyChart is used to connect with patients for Virtual Visits (Telemedicine).  Patients are able to view lab/test results, encounter notes, upcoming appointments, etc.  Non-urgent messages can be sent to your provider as well.   To learn more about what you can do with MyChart, go to [email or web address].    Your next appointment:   9 month(s)  The format for your next appointment:   In Person  Provider:   Kardie Tobb, DO     Other Instructions   Important Information About Sugar       

## 2021-06-06 NOTE — Progress Notes (Signed)
?Cardiology Office Note:   ? ?Date:  06/06/2021  ? ?ID:  [name], DOB 1950/05/01, MRN [id number] ? ?PCP:  [name], MD  ?Cardiologist:  Berniece Salines, DO  ?Electrophysiologist:  None  ? ?Referring MD: [name], MD  ? ?" I am doing fine" ? ?History of Present Illness:   ? ?Audrey Ruiz is a 71 y.o. female with a hx of hyperlipidemia, diabetes TYPE II, history of chronic systolic heart failure/nonischemic cardiomyopathy thought to be due to Adriamycin accessory status post BiV ICD with improved EF on her most recent echocardiogram 50 to 55%, here today for follow-up visit. ? ?At her last visit on March 06, 2021 she appeared to be stable from a cardiovascular standpoint.  She had reported some symptomatic relief.  We will plan for an echocardiogram prior to her visit.  She was able to get her echocardiogram which has shown improved ejection fraction. ? ?She offers no complaints at this time.  She is here today with her husband. ? ?Her visit with EP back in March she was hypotensive metoprolol as well as Aldactone was stopped.  Her blood pressure is good today. ? ?Past Medical History:  ?Diagnosis Date  ? Abnormal nuclear stress test 08/08/2020  ? Abnormal nuclear stress test 08/08/2020  ? Anxiety   ? Closed fracture of distal end of left radius 08/16/2020  ? Depressed left ventricular ejection fraction   ? Depression   ? Diabetes mellitus without complication ([place])   ? Dilated cardiomyopathy ([place]) 08/08/2020  ? Encounter for Medicare annual wellness exam 10/19/2019  ? Gastroesophageal reflux disease 10/19/2019  ? GERD (gastroesophageal reflux disease)   ? Hyperlipidemia   ? Hyperlipidemia associated with type 2 diabetes mellitus ([place]) 10/19/2019  ? LBBB (left bundle branch block) 08/17/2020  ? Mitral valve prolapse   ? Nonischemic cardiomyopathy ([place]) 08/17/2020  ? Osteopenia 10/19/2019  ? Sinusitis 08/16/2019  ? ? ?Past Surgical History:  ?Procedure Laterality Date  ? BIV ICD INSERTION  CRT-D N/A 02/05/2021  ? Procedure: BIV ICD INSERTION CRT-D;  Surgeon: [name], MD;  Location: [facility];  Service: Cardiovascular;  Laterality: N/A;  ? BREAST LUMPECTOMY Left 2010  ? LEFT HEART CATH AND CORONARY ANGIOGRAPHY N/A 08/08/2020  ? Procedure: LEFT HEART CATH AND CORONARY ANGIOGRAPHY;  Surgeon: Leonie Man, MD;  Location: [place] [facility];  Service: Cardiovascular;  Laterality: N/A;  ? ? ?Current Medications: ?Current Meds  ?Medication Sig  ? cetirizine (ZYRTEC) 10 MG tablet Take 10 mg by mouth daily.  ? ezetimibe (ZETIA) 10 MG tablet Take 1 tablet (10 mg total) by mouth daily.  ? FLUoxetine (PROZAC) 40 MG capsule Take 1 capsule (40 mg total) by mouth daily.  ? fluticasone (FLONASE) 50 MCG/ACT nasal spray Use 2 spray(s) in each nostril once daily  ? Glucosamine-Chondroitin-MSM 1500-1200-500 MG PACK Take 1 tablet by mouth daily.  ? ibuprofen (ADVIL) 200 MG tablet Take 200 mg by mouth every 8 (eight) hours as needed for moderate pain.  ? LORazepam (ATIVAN) 0.5 MG tablet Take 1 tablet (0.5 mg total) by mouth at bedtime as needed for anxiety.  ? Melatonin 10 MG CAPS Take 5 mg by mouth at bedtime.  ? Multiple Vitamin (MULTIVITAMIN) capsule Take 1 capsule by mouth daily.  ? Omega-3 Fatty Acids (FISH OIL) 1000 MG CAPS Take 1,000 mg by mouth 2 (two) times daily.  ? omeprazole (PRILOSEC) 20 MG capsule Take 1 capsule (20 mg total) by mouth daily.  ?  rosuvastatin (CRESTOR) 40 MG tablet Take 1 tablet (40 mg total) by mouth daily.  ? sacubitril-valsartan (ENTRESTO) 24-26 MG Take 1 tablet by mouth 2 (two) times daily.  ? Vitamin D, Cholecalciferol, 25 MCG (1000 UT) CAPS Take 1,000 Units by mouth daily.  ?  ? ?Allergies:   Patient has no known allergies.  ? ?Social History  ? ?Socioeconomic History  ? Marital status: Married  ?  Spouse name: [name]  ? Number of children: 2  ? Years of education: Not on file  ? Highest education level: Master's degree (e.g., MA, MS, MEng, MEd, MSW, MBA)   ?Occupational History  ? Occupation: Retired  ?Tobacco Use  ? Smoking status: Never  ? Smokeless tobacco: Never  ?Vaping Use  ? Vaping Use: Never used  ?Substance and Sexual Activity  ? Alcohol use: Yes  ?  Alcohol/week: 2.0 standard drinks  ?  Types: 2 Glasses of wine per week  ?  Comment: occasional (white wine)  ? Drug use: Never  ? Sexual activity: Not Currently  ?Other Topics Concern  ? Not on file  ?Social History Narrative  ? Son lives in [place], Daughter lives in [place]  ? Patient 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 mother and father as well as two aunts  ? ?Social Determinants of Health  ? ?Financial Resource Strain: Low Risk   ? Difficulty of Paying Living Expenses: Not hard at all  ?Food Insecurity: No Food Insecurity  ? Worried About Charity fundraiser in the Last Year: Never true  ? Ran Out of Food in the Last Year: Never true  ?Transportation Needs: No Transportation Needs  ? Lack of Transportation (Medical): No  ? Lack of Transportation (Non-Medical): No  ?Physical Activity: Sufficiently Active  ? Days of Exercise per Week: 6 days  ? Minutes of Exercise per Session: 30 min  ?Stress: Not on file  ?Social Connections: Not on file  ?  ? ?Family History: ?The patient's family history includes Breast cancer in her daughter; Coronary artery disease in her father; Diabetes in her maternal grandfather and sister; Osteoporosis in her mother. ? ?ROS:   ?Review of Systems  ?Constitution: Negative for decreased appetite, fever and weight gain.  ?HENT: Negative for congestion, ear discharge, hoarse voice and sore throat.   ?Eyes: Negative for discharge, redness, vision loss in right eye and visual halos.  ?Cardiovascular: Negative for chest pain, dyspnea on exertion, leg swelling, orthopnea and palpitations.  ?Respiratory: Negative for cough, hemoptysis, shortness of breath and snoring.   ?Endocrine: Negative for heat intolerance and polyphagia.  ?Hematologic/Lymphatic: Negative for bleeding problem. Does not  bruise/bleed easily.  ?Skin: Negative for flushing, nail changes, rash and suspicious lesions.  ?Musculoskeletal: Negative for arthritis, joint pain, muscle cramps, myalgias, neck pain and stiffness.  ?Gastrointestinal: Negative for abdominal pain, bowel incontinence, diarrhea and excessive appetite.  ?Genitourinary: Negative for decreased libido, genital sores and incomplete emptying.  ?Neurological: Negative for brief paralysis, focal weakness, headaches and loss of balance.  ?Psychiatric/Behavioral: Negative for altered mental status, depression and suicidal ideas.  ?Allergic/Immunologic: Negative for HIV exposure and persistent infections.  ? ? ?EKGs/Labs/Other Studies Reviewed:   ? ?The following studies were reviewed today: ? ? ?EKG:  None today  ? ?TTE 05/30/2021 ?IMPRESSIONS  ? ? ? 1. GLS -13.7. Left ventricular ejection fraction, by estimation, is 50 to  ?55%. The left ventricle has low normal function. The left ventricle has no  ?regional wall motion abnormalities. Left ventricular diastolic parameters  ?are consistent  with Grade I  ?diastolic dysfunction (impaired relaxation).  ? 2. Right ventricular systolic function is normal. The right ventricular  ?size is normal. There is normal pulmonary artery systolic pressure.  ? 3. The mitral valve is normal in structure. No evidence of mitral valve  ?regurgitation. No evidence of mitral stenosis.  ? 4. The aortic valve is normal in structure. Aortic valve regurgitation is  ?not visualized. No aortic stenosis is present.  ? 5. The inferior vena cava is normal in size with greater than 50%  ?respiratory variability, suggesting right atrial pressure of 3 mmHg.  ? ?FINDINGS  ? Left Ventricle: GLS -13.7. Left ventricular ejection fraction, by  ?estimation, is 50 to 55%. The left ventricle has low normal function. The  ?left ventricle has no regional wall motion abnormalities. The left  ?ventricular internal cavity size was normal in  ? size. There is no left  ventricular hypertrophy. Left ventricular  ?diastolic parameters are consistent with Grade I diastolic dysfunction  ?(impaired relaxation).  ? ?Right Ventricle: The right ventricular size is normal. No increase in  ?right ventri

## 2021-06-22 DIAGNOSIS — Z1231 Encounter for screening mammogram for malignant neoplasm of breast: Secondary | ICD-10-CM | POA: Diagnosis not present

## 2021-06-22 DIAGNOSIS — R928 Other abnormal and inconclusive findings on diagnostic imaging of breast: Secondary | ICD-10-CM | POA: Diagnosis not present

## 2021-06-24 NOTE — Progress Notes (Signed)
?[facility]  ?[address] ?[place],  [place]  [id number] ?[phone] ? ?Clinic Day:  06/25/2021 ? ?Referring physician: [name], MD ? ? ?HISTORY OF PRESENT ILLNESS:  ?The patient is a 71 y.o. woman with stage IIA (T3 N0 M0) hormone positive breast cancer, status post a lumpectomy in November 2010. This was followed by adjuvant chemotherapy and radiation.  She also took anastrozole for 6+ years for her adjuvant hormonal management.  She comes in today for routine annual followup.  Since her last visit, the patient has been doing very well.  She denies having any particular changes in her breasts which concern her for disease recurrence.  Of note, her annual mammogram done last week showed a suspicious asymmetry in her left breast that warranted wanted further evaluation. ? ? ?PHYSICAL EXAM:  ?Blood pressure 133/64, pulse 74, temperature 97.7 ?F (36.5 ?C), resp. rate 14, height '5\' 7"'$  (1.702 m), weight 132 lb 3.2 oz (60 kg), SpO2 98 %. ?Wt Readings from Last 3 Encounters:  ?06/25/21 132 lb 3.2 oz (60 kg)  ?06/06/21 132 lb 12.8 oz (60.2 kg)  ?05/14/21 132 lb (59.9 kg)  ? ?Body mass index is 20.71 kg/m?Marland Kitchen ?Performance status (ECOG): 0 - Asymptomatic ? ?Physical Exam ?Constitutional:   ?   Appearance: Normal appearance. She is not ill-appearing.  ?HENT:  ?   Mouth/Throat:  ?   Mouth: Mucous membranes are moist.  ?   Pharynx: Oropharynx is clear. No oropharyngeal exudate or posterior oropharyngeal erythema.  ?Cardiovascular:  ?   Rate and Rhythm: Normal rate and regular rhythm.  ?   Heart sounds: No murmur heard. ?  No friction rub. No gallop.  ?Pulmonary:  ?   Effort: Pulmonary effort is normal. No respiratory distress.  ?   Breath sounds: Normal breath sounds. No wheezing, rhonchi or rales.  ?Chest:  ?Breasts: ?   Right: No swelling, bleeding, inverted nipple, mass, nipple discharge or skin change.  ?   Left: No swelling, bleeding, inverted nipple, mass, nipple discharge or skin  change.  ?Abdominal:  ?   General: Bowel sounds are normal. There is no distension.  ?   Palpations: Abdomen is soft. There is no mass.  ?   Tenderness: There is no abdominal tenderness.  ?Musculoskeletal:     ?   General: No swelling or tenderness.  ?   Cervical back: Normal range of motion and neck supple.  ?   Right lower leg: No edema.  ?   Left lower leg: No edema.  ?Lymphadenopathy:  ?   Cervical: No cervical adenopathy.  ?   Right cervical: No superficial, deep or posterior cervical adenopathy. ?   Left cervical: No superficial, deep or posterior cervical adenopathy.  ?   Upper Body:  ?   Right upper body: No supraclavicular or axillary adenopathy.  ?   Left upper body: No supraclavicular or axillary adenopathy.  ?   Lower Body: No right inguinal adenopathy. No left inguinal adenopathy.  ?Skin: ?   General: Skin is warm.  ?   Coloration: Skin is not jaundiced.  ?   Findings: No lesion or rash.  ?Neurological:  ?   General: No focal deficit present.  ?   Mental Status: She is alert and oriented to person, place, and time. Mental status is at baseline.  ?Psychiatric:     ?   Mood and Affect: Mood normal.     ?   Behavior: Behavior normal.     ?  Thought Content: Thought content normal.     ?   Judgment: Judgment normal.  ? ?STUDIES:  ?  ?FINDINGS:  ?In the left breast, a possible asymmetry warrants further  ?evaluation. In the right breast, no findings suspicious for  ?malignancy.  ?IMPRESSION:  ?Further evaluation is suggested for possible asymmetry in the left  ?breast.  ? ?RECOMMENDATION:  ?Diagnostic mammogram and possibly ultrasound of the left breast.  ?(Code:FI-L-33M)  ?The patient will be contacted regarding the findings, and additional  ?imaging will be scheduled.  ?BI-RADS CATEGORY 0: Incomplete. Need additional  ? ?ASSESSMENT & PLAN:  ?Assessment/Plan:  A 71 y.o. woman with stage IIA hormone positive breast cancer, who is over 12 years out from her lumpectomy.  Although her clinical breast exam  today is normal, her screening mammogram done last week showed asymmetry in her left breast.  Based upon this, I will order a diagnostic mammogram for further evaluation.  If abnormal, she understands a biopsy would be done to rule out disease recurrence.  Clinically, the patient is doing well.  I will see her back in 1 year unless her follow-up studies show disease recurrence to where reintroduction of some form of breast cancer therapy needs to be considered.  I will keep her informed of her mammogram results as soon as they become available.  The patient understands all the plans discussed today and is in agreement with them. ? ?Marquie Aderhold Macarthur Critchley, MD   ? ? ?  ?

## 2021-06-25 ENCOUNTER — Inpatient Hospital Stay: Payer: HMO | Attending: Oncology | Admitting: Oncology

## 2021-06-25 VITALS — BP 133/64 | HR 74 | Temp 97.7°F | Resp 14 | Ht 67.0 in | Wt 132.2 lb

## 2021-06-25 DIAGNOSIS — Z170 Estrogen receptor positive status [ER+]: Secondary | ICD-10-CM

## 2021-06-25 DIAGNOSIS — C50211 Malignant neoplasm of upper-inner quadrant of right female breast: Secondary | ICD-10-CM | POA: Diagnosis not present

## 2021-06-27 ENCOUNTER — Encounter: Payer: Self-pay | Admitting: Oncology

## 2021-06-27 DIAGNOSIS — R928 Other abnormal and inconclusive findings on diagnostic imaging of breast: Secondary | ICD-10-CM | POA: Diagnosis not present

## 2021-06-27 DIAGNOSIS — N649 Disorder of breast, unspecified: Secondary | ICD-10-CM | POA: Diagnosis not present

## 2021-06-28 ENCOUNTER — Telehealth: Payer: Self-pay

## 2021-06-28 NOTE — Progress Notes (Signed)
? ? ?Chronic Care Management ?Pharmacy Assistant  ? ?Name: Eldene Plocher  MRN: [id number] DOB: 1950-12-10 ? ? ?Reason for Encounter: Medication Coordination for [facility]  ?  ?Recent office visits:  ?None ? ?Recent consult visits:  ?06/25/21 (Oncology) Lavera Guise MD. Seen for Malignant Neoplasm. No med changes. ? ?06/06/21 (Cardiology) Berniece Salines DO. Seen for Nonischemic Cardiomyopathy. No med changes. ? ?05/14/21 (Cardiology) Meredith Leeds MD. Seen for CHF. No med changes. ? ?Hospital visits:  ?None ? ?Medications: ?Outpatient Encounter Medications as of 06/28/2021  ?Medication Sig  ? cetirizine (ZYRTEC) 10 MG tablet Take 10 mg by mouth daily.  ? ezetimibe (ZETIA) 10 MG tablet Take 1 tablet (10 mg total) by mouth daily.  ? FLUoxetine (PROZAC) 40 MG capsule Take 1 capsule (40 mg total) by mouth daily.  ? fluticasone (FLONASE) 50 MCG/ACT nasal spray Use 2 spray(s) in each nostril once daily  ? Glucosamine-Chondroitin-MSM 1500-1200-500 MG PACK Take 1 tablet by mouth daily.  ? ibuprofen (ADVIL) 200 MG tablet Take 200 mg by mouth every 8 (eight) hours as needed for moderate pain.  ? LORazepam (ATIVAN) 0.5 MG tablet Take 1 tablet (0.5 mg total) by mouth at bedtime as needed for anxiety.  ? Melatonin 10 MG CAPS Take 5 mg by mouth at bedtime.  ? metoprolol succinate (TOPROL XL) 25 MG 24 hr tablet Take 0.5 tablets (12.5 mg total) by mouth daily. (Patient not taking: Reported on 06/06/2021)  ? Multiple Vitamin (MULTIVITAMIN) capsule Take 1 capsule by mouth daily.  ? Omega-3 Fatty Acids (FISH OIL) 1000 MG CAPS Take 1,000 mg by mouth 2 (two) times daily.  ? omeprazole (PRILOSEC) 20 MG capsule Take 1 capsule (20 mg total) by mouth daily.  ? rosuvastatin (CRESTOR) 40 MG tablet Take 1 tablet (40 mg total) by mouth daily.  ? sacubitril-valsartan (ENTRESTO) 24-26 MG Take 1 tablet by mouth 2 (two) times daily.  ? spironolactone (ALDACTONE) 25 MG tablet Take 0.5 tablets (12.5 mg total) by mouth daily. (Patient not  taking: Reported on 06/06/2021)  ? Vitamin D, Cholecalciferol, 25 MCG (1000 UT) CAPS Take 1,000 Units by mouth daily.  ? ?No facility-administered encounter medications on file as of 06/28/2021.  ? ? ?Reviewed chart for medication changes ahead of medication coordination call. ? ?No OVs, or hospital visits since last care coordination call/Pharmacist visit.  ? ?No medication changes indicated OR if recent visit, treatment plan here. ? ?BP Readings from Last 3 Encounters:  ?06/25/21 133/64  ?06/06/21 120/68  ?05/14/21 (!) 90/52  ?  ?Lab Results  ?Component Value Date  ? HGBA1C 5.7 (H) 12/25/2020  ?  ? ?Patient obtains medications through Adherence Packaging  90 Days  ? ?Patient is due for her first adherence delivery on: 07/10/21. ?Called patient and reviewed medications and coordinated delivery. ? ?This delivery to include: ?Flonase Nasal Spray 72mg- 2 sprays each nostril daily  ?Cetirizine '10mg'$  1 at B ?Ezetimibe '10mg'$  1 at BT ?Omeprazole '20mg'$ - 1 B ?Rosuvastatin '40mg'$  1 at BT ?Entresto 24-'26mg'$  1 at B 1 at BT ?Vitamin D3 263m 1 at B ?Fish Oil '1000mg'$  1 at B and 1 at BT ?Fluoxetine '40mg'$ - 1 at B ? ?Patient declined the following medications  ?Metoprolol '25mg'$ - D/C by Cardiologist due to low BP ?Spironolactone '25mg'$ - D/C by Cardiologist due to low BP ?Mel[facility] '10mg'$ - [facility] - Pt only uses PRN and still has plenty on hand ?Glucosamine-Chondroitin 1500-1200 ? ?Patient needs refills  ?None ? ?Confirmed delivery date of  07/10/21, advised patient that pharmacy will contact them the morning of delivery. ? ? ?[name], CMA ?Clinical Pharmacist Assistant  ?[phone]  ?

## 2021-06-28 NOTE — Telephone Encounter (Signed)
DC Metoprolol and Spironolactone per Cardio note on 06/06/21 ?

## 2021-07-18 ENCOUNTER — Telehealth: Payer: Self-pay

## 2021-07-18 NOTE — Telephone Encounter (Signed)
Called and spoke with [name] at [facility]. She was attempting to help with the prior auth but decided to fax the message instead. She requested documentation of a decreased EF of the pt. Will fax the notes and results to the proper place once I receive it.

## 2021-08-06 ENCOUNTER — Ambulatory Visit (INDEPENDENT_AMBULATORY_CARE_PROVIDER_SITE_OTHER): Payer: HMO | Admitting: Family Medicine

## 2021-08-06 VITALS — BP 110/60 | HR 72 | Temp 97.1°F | Resp 16 | Ht 67.0 in | Wt 133.0 lb

## 2021-08-06 DIAGNOSIS — R5383 Other fatigue: Secondary | ICD-10-CM | POA: Diagnosis not present

## 2021-08-06 DIAGNOSIS — R202 Paresthesia of skin: Secondary | ICD-10-CM | POA: Diagnosis not present

## 2021-08-06 NOTE — Progress Notes (Unsigned)
Acute Office Visit  Subjective:    Patient ID: Audrey Ruiz, female    DOB: February 03, 1951, 71 y.o.   MRN: [id number]  Chief Complaint  Patient presents with   Fatigue    HPI: Patient is in today for fatigue.sleeping okay. Gets 10 hrs of sleep. Pacemaker has helped.  Having chest congestion. Has post nasal drainage. NO chest pain or dyspnea.   Increased prozac 40 mg before bed. Heavy feeling.  Numbness in feet from chemo. Has improve.  No changes in weight.   Past Medical History:  Diagnosis Date   Abnormal nuclear stress test 08/08/2020   Abnormal nuclear stress test 08/08/2020   Anxiety    Closed fracture of distal end of left radius 08/16/2020   Depressed left ventricular ejection fraction    Depression    Diabetes mellitus without complication ([place])    Dilated cardiomyopathy ([place]) 08/08/2020   Encounter for Medicare annual wellness exam 10/19/2019   Gastroesophageal reflux disease 10/19/2019   GERD (gastroesophageal reflux disease)    Hyperlipidemia    Hyperlipidemia associated with type 2 diabetes mellitus ([place]) 10/19/2019   LBBB (left bundle branch block) 08/17/2020   Mitral valve prolapse    Nonischemic cardiomyopathy ([place]) 08/17/2020   Osteopenia 10/19/2019   Sinusitis 08/16/2019    Past Surgical History:  Procedure Laterality Date   BIV ICD INSERTION CRT-D N/A 02/05/2021   Procedure: BIV ICD INSERTION CRT-D;  Surgeon: [name], MD;  Location: [facility];  Service: Cardiovascular;  Laterality: N/A;   BREAST LUMPECTOMY Left 2010   LEFT HEART CATH AND CORONARY ANGIOGRAPHY N/A 08/08/2020   Procedure: LEFT HEART CATH AND CORONARY ANGIOGRAPHY;  Surgeon: Leonie Man, MD;  Location: [facility];  Service: Cardiovascular;  Laterality: N/A;    Family History  Problem Relation Age of Onset   Osteoporosis Mother    Coronary artery disease Father    Diabetes Sister    Breast cancer Daughter    Diabetes Maternal Grandfather      Social History   Socioeconomic History   Marital status: Married    Spouse name: [name]   Number of children: 2   Years of education: Not on file   Highest education level: Master's degree (e.g., MA, MS, MEng, MEd, MSW, MBA)  Occupational History   Occupation: Retired  Tobacco Use   Smoking status: Never   Smokeless tobacco: Never  Vaping Use   Vaping Use: Never used  Substance and Sexual Activity   Alcohol use: Yes    Alcohol/week: 2.0 standard drinks of alcohol    Types: 2 Glasses of wine per week    Comment: occasional (white wine)   Drug use: Never   Sexual activity: Not Currently  Other Topics Concern   Not on file  Social History Narrative   Son lives in [place], Daughter lives in [place]   Patient 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 mother and father as well as two aunts   Social Determinants of Health   Financial Resource Strain: Low Risk  (04/13/2021)   Overall Financial Resource Strain (CARDIA)    Difficulty of Paying Living Expenses: Not hard at all  Food Insecurity: No Food Insecurity (10/18/2020)   Hunger Vital Sign    Worried About Running Out of Food in the Last Year: Never true    Waves in the Last Year: Never true  Transportation Needs: No Transportation Needs (04/13/2021)   PRAPARE - Transportation    Lack  of Transportation (Medical): No    Lack of Transportation (Non-Medical): No  Physical Activity: Sufficiently Active (10/26/2020)   Exercise Vital Sign    Days of Exercise per Week: 6 days    Minutes of Exercise per Session: 30 min  Stress: Not on file  Social Connections: Not on file  Intimate Partner Violence: Not At Risk (10/26/2020)   Humiliation, Afraid, Rape, and Kick questionnaire    Fear of Current or Ex-Partner: No    Emotionally Abused: No    Physically Abused: No    Sexually Abused: No    Outpatient Medications Prior to Visit  Medication Sig Dispense Refill   cetirizine (ZYRTEC) 10 MG tablet Take 10 mg by mouth daily.      ezetimibe (ZETIA) 10 MG tablet Take 1 tablet (10 mg total) by mouth daily. 90 tablet 3   FLUoxetine (PROZAC) 40 MG capsule Take 1 capsule (40 mg total) by mouth daily. 90 capsule 1   fluticasone (FLONASE) 50 MCG/ACT nasal spray Use 2 spray(s) in each nostril once daily 48 g 3   ibuprofen (ADVIL) 200 MG tablet Take 200 mg by mouth every 8 (eight) hours as needed for moderate pain.     LORazepam (ATIVAN) 0.5 MG tablet Take 1 tablet (0.5 mg total) by mouth at bedtime as needed for anxiety. 30 tablet 2   Multiple Vitamin (MULTIVITAMIN) capsule Take 1 capsule by mouth daily.     Omega-3 Fatty Acids (FISH OIL) 1000 MG CAPS Take 1,000 mg by mouth 2 (two) times daily.     omeprazole (PRILOSEC) 20 MG capsule Take 1 capsule (20 mg total) by mouth daily. 90 capsule 3   rosuvastatin (CRESTOR) 40 MG tablet Take 1 tablet (40 mg total) by mouth daily. 90 tablet 3   sacubitril-valsartan (ENTRESTO) 24-26 MG Take 1 tablet by mouth 2 (two) times daily. 180 tablet 3   Vitamin D, Cholecalciferol, 25 MCG (1000 UT) CAPS Take 1,000 Units by mouth daily.     Glucosamine-Chondroitin-MSM 1500-1200-500 MG PACK Take 1 tablet by mouth daily. (Patient not taking: Reported on 08/06/2021)     Melatonin 10 MG CAPS Take 5 mg by mouth at bedtime.     metoprolol succinate (TOPROL XL) 25 MG 24 hr tablet Take 0.5 tablets (12.5 mg total) by mouth daily. (Patient not taking: Reported on 06/28/2021) 45 tablet 3   spironolactone (ALDACTONE) 25 MG tablet Take 0.5 tablets (12.5 mg total) by mouth daily. (Patient not taking: Reported on 06/28/2021) 45 tablet 3   No facility-administered medications prior to visit.    No Known Allergies  Review of Systems  Constitutional:  Positive for fatigue.  HENT:  Positive for postnasal drip and sneezing. Negative for congestion and ear pain.   Respiratory:  Positive for cough. Negative for shortness of breath.   Cardiovascular:  Negative for chest pain and palpitations.  Gastrointestinal:  Negative  for abdominal pain, constipation, diarrhea, nausea and vomiting.  Genitourinary:  Negative for dysuria and frequency.  Musculoskeletal:  Positive for arthralgias (right knee pain).  Neurological:  Positive for dizziness, weakness and headaches (sometimes.).       Balance issues   Psychiatric/Behavioral:  Positive for dysphoric mood.        Objective:    Physical Exam  BP 110/60   Pulse 72   Temp (!) 97.1 F (36.2 C)   Resp 16   Ht _0  (1.702 m)   Wt 133 lb (60.3 kg)   BMI 20.83 kg/m  Wt Readings from  Last 3 Encounters:  08/06/21 133 lb (60.3 kg)  06/25/21 132 lb 3.2 oz (60 kg)  06/06/21 132 lb 12.8 oz (60.2 kg)    Health Maintenance Due  Topic Date Due   FOOT EXAM  Never done   OPHTHALMOLOGY EXAM  Never done   Hepatitis C Screening  Never done   Zoster Vaccines- Shingrix (1 of 2) Never done   HEMOGLOBIN A1C  06/24/2021    There are no preventive care reminders to display for this patient.   Lab Results  Component Value Date   TSH 2.490 12/25/2020   Lab Results  Component Value Date   WBC 8.0 01/16/2021   HGB 11.9 01/16/2021   HCT 37.1 01/16/2021   MCV 98 (H) 01/16/2021   PLT 306 01/16/2021   Lab Results  Component Value Date   NA 139 01/10/2021   K 4.7 01/10/2021   CO2 24 01/10/2021   GLUCOSE 91 01/10/2021   BUN 16 01/10/2021   CREATININE 0.68 01/10/2021   BILITOT 0.8 12/25/2020   ALKPHOS 49 12/25/2020   AST 32 12/25/2020   ALT 32 12/25/2020   PROT 6.3 12/25/2020   ALBUMIN 4.6 12/25/2020   CALCIUM 10.3 01/10/2021   EGFR 92 01/10/2021   Lab Results  Component Value Date   CHOL 133 12/25/2020   Lab Results  Component Value Date   HDL 73 12/25/2020   Lab Results  Component Value Date   LDLCALC 45 12/25/2020   Lab Results  Component Value Date   TRIG 78 12/25/2020   Lab Results  Component Value Date   CHOLHDL 1.8 12/25/2020   Lab Results  Component Value Date   HGBA1C 5.7 (H) 12/25/2020       Assessment & Plan:   Problem  List Items Addressed This Visit   None  No orders of the defined types were placed in this encounter.   No orders of the defined types were placed in this encounter.    Follow-up: No follow-ups on file.  An After Visit Summary was printed and given to the patient.  [name], MD [facility] [phone]

## 2021-08-07 ENCOUNTER — Ambulatory Visit (INDEPENDENT_AMBULATORY_CARE_PROVIDER_SITE_OTHER): Payer: HMO

## 2021-08-07 DIAGNOSIS — I428 Other cardiomyopathies: Secondary | ICD-10-CM | POA: Diagnosis not present

## 2021-08-08 LAB — CUP PACEART REMOTE DEVICE CHECK
Battery Remaining Longevity: 84 mo
Battery Remaining Percentage: 90 %
Battery Voltage: 2.98 V
Brady Statistic AP VP Percent: 1 %
Brady Statistic AP VS Percent: 1 %
Brady Statistic AS VP Percent: 96 %
Brady Statistic AS VS Percent: 2.5 %
Brady Statistic RA Percent Paced: 1 %
Date Time Interrogation Session: 20230620020052
HighPow Impedance: 60 Ohm
Implantable Lead Implant Date: 20221219
Implantable Lead Implant Date: 20221219
Implantable Lead Implant Date: 20221219
Implantable Lead Location: 753858
Implantable Lead Location: 753859
Implantable Lead Location: 753860
Implantable Pulse Generator Implant Date: 20221219
Lead Channel Impedance Value: 360 Ohm
Lead Channel Impedance Value: 390 Ohm
Lead Channel Impedance Value: 600 Ohm
Lead Channel Pacing Threshold Amplitude: 0.625 V
Lead Channel Pacing Threshold Amplitude: 0.75 V
Lead Channel Pacing Threshold Amplitude: 1.75 V
Lead Channel Pacing Threshold Pulse Width: 0.5 ms
Lead Channel Pacing Threshold Pulse Width: 0.5 ms
Lead Channel Pacing Threshold Pulse Width: 0.5 ms
Lead Channel Sensing Intrinsic Amplitude: 12 mV
Lead Channel Sensing Intrinsic Amplitude: 2.2 mV
Lead Channel Setting Pacing Amplitude: 1.625
Lead Channel Setting Pacing Amplitude: 2 V
Lead Channel Setting Pacing Amplitude: 2.25 V
Lead Channel Setting Pacing Pulse Width: 0.5 ms
Lead Channel Setting Pacing Pulse Width: 0.5 ms
Lead Channel Setting Sensing Sensitivity: 0.5 mV
Pulse Gen Serial Number: 111053351

## 2021-08-08 LAB — CBC WITH DIFFERENTIAL/PLATELET
Basophils Absolute: 0.1 10*3/uL (ref 0.0–0.2)
Basos: 1 %
EOS (ABSOLUTE): 0.1 10*3/uL (ref 0.0–0.4)
Eos: 3 %
Hematocrit: 36.7 % (ref 34.0–46.6)
Hemoglobin: 12 g/dL (ref 11.1–15.9)
Immature Grans (Abs): 0 10*3/uL (ref 0.0–0.1)
Immature Granulocytes: 0 %
Lymphocytes Absolute: 1.6 10*3/uL (ref 0.7–3.1)
Lymphs: 31 %
MCH: 31.6 pg (ref 26.6–33.0)
MCHC: 32.7 g/dL (ref 31.5–35.7)
MCV: 97 fL (ref 79–97)
Monocytes Absolute: 0.4 10*3/uL (ref 0.1–0.9)
Monocytes: 7 %
Neutrophils Absolute: 3 10*3/uL (ref 1.4–7.0)
Neutrophils: 58 %
Platelets: 263 10*3/uL (ref 150–450)
RBC: 3.8 x10E6/uL (ref 3.77–5.28)
RDW: 12.3 % (ref 11.7–15.4)
WBC: 5.1 10*3/uL (ref 3.4–10.8)

## 2021-08-08 LAB — COMPREHENSIVE METABOLIC PANEL
ALT: 40 IU/L — ABNORMAL HIGH (ref 0–32)
AST: 36 IU/L (ref 0–40)
Albumin/Globulin Ratio: 2.2 (ref 1.2–2.2)
Albumin: 4.4 g/dL (ref 3.7–4.7)
Alkaline Phosphatase: 49 IU/L (ref 44–121)
BUN/Creatinine Ratio: 23 (ref 12–28)
BUN: 16 mg/dL (ref 8–27)
Bilirubin Total: 0.7 mg/dL (ref 0.0–1.2)
CO2: 24 mmol/L (ref 20–29)
Calcium: 10.5 mg/dL — ABNORMAL HIGH (ref 8.7–10.3)
Chloride: 103 mmol/L (ref 96–106)
Creatinine, Ser: 0.7 mg/dL (ref 0.57–1.00)
Globulin, Total: 2 g/dL (ref 1.5–4.5)
Glucose: 80 mg/dL (ref 70–99)
Potassium: 4.9 mmol/L (ref 3.5–5.2)
Sodium: 138 mmol/L (ref 134–144)
Total Protein: 6.4 g/dL (ref 6.0–8.5)
eGFR: 92 mL/min/{1.73_m2} (ref >59)

## 2021-08-08 LAB — B12 AND FOLATE PANEL
Folate: >20 ng/mL (ref >3.0)
Vitamin B-12: 862 pg/mL (ref 232–1245)

## 2021-08-08 LAB — VITAMIN D 25 HYDROXY (VIT D DEFICIENCY, FRACTURES): Vit D, 25-Hydroxy: 62.8 ng/mL (ref 30.0–100.0)

## 2021-08-08 LAB — METHYLMALONIC ACID, SERUM: Methylmalonic Acid: 154 nmol/L (ref 0–378)

## 2021-08-08 LAB — TSH: TSH: 1.89 u[IU]/mL (ref 0.450–4.500)

## 2021-08-10 DIAGNOSIS — R202 Paresthesia of skin: Secondary | ICD-10-CM | POA: Insufficient documentation

## 2021-08-10 DIAGNOSIS — R5383 Other fatigue: Secondary | ICD-10-CM | POA: Insufficient documentation

## 2021-08-10 NOTE — Assessment & Plan Note (Signed)
 Checked labs.

## 2021-08-12 ENCOUNTER — Encounter: Payer: Self-pay | Admitting: Family Medicine

## 2021-08-14 ENCOUNTER — Telehealth: Payer: HMO

## 2021-08-23 NOTE — Progress Notes (Signed)
Remote ICD transmission.   

## 2021-09-23 ENCOUNTER — Encounter: Payer: Self-pay | Admitting: Family Medicine

## 2021-09-23 NOTE — Progress Notes (Signed)
Subjective:  Patient ID: Audrey Ruiz, female    DOB: [birth year]  Age: 71 y.o. MRN: [id number]  Chief Complaint  Patient presents with   Hyperlipidemia   Prediabetes    HPI Depression: Fluoxetine 40 mg daily. Lorazeapam rarely ever uses. Melatonin helps sleep      08/06/2021   11:21 AM 12/25/2020    8:33 AM 10/26/2020   10:41 AM  PHQ9 SCORE ONLY  PHQ-9 Total Score 6 3 0   Hyperlipidemia: Current medications: Zetia 10 mg daily, Fish Oil 1000 mg twice a day, rosuvastatin 40 mg daily.  CHF: Current medications: Entresto 24-26 1 tablet twice a day. Patient is post BiV ICD with improved EF on her most recent echocardiogram 50 to 55% 05/2021. Patient has heart fluttering at about 12:30 pm and at 8:30 pm.  GERD: Taking Omeprazole 20 mg daily.  Current Outpatient Medications on File Prior to Visit  Medication Sig Dispense Refill   cetirizine (ZYRTEC) 10 MG tablet Take 10 mg by mouth daily.     ezetimibe (ZETIA) 10 MG tablet Take 1 tablet (10 mg total) by mouth daily. 90 tablet 3   fluticasone (FLONASE) 50 MCG/ACT nasal spray Use 2 spray(s) in each nostril once daily 48 g 3   Glucosamine-Chondroitin-MSM 1500-1200-500 MG PACK Take 1 tablet by mouth daily.     ibuprofen (ADVIL) 200 MG tablet Take 200 mg by mouth every 8 (eight) hours as needed for moderate pain.     LORazepam (ATIVAN) 0.5 MG tablet Take 1 tablet (0.5 mg total) by mouth at bedtime as needed for anxiety. 30 tablet 2   Melatonin 10 MG CAPS Take 5 mg by mouth at bedtime.     Multiple Vitamin (MULTIVITAMIN) capsule Take 1 capsule by mouth daily.     Omega-3 Fatty Acids (FISH OIL) 1000 MG CAPS Take 1,000 mg by mouth 2 (two) times daily.     omeprazole (PRILOSEC) 20 MG capsule Take 1 capsule (20 mg total) by mouth daily. 90 capsule 3   rosuvastatin (CRESTOR) 40 MG tablet Take 1 tablet (40 mg total) by mouth daily. 90 tablet 3   sacubitril-valsartan (ENTRESTO) 24-26 MG Take 1 tablet by mouth 2 (two) times daily. 180  tablet 3   Vitamin D, Cholecalciferol, 25 MCG (1000 UT) CAPS Take 1,000 Units by mouth daily.     No current facility-administered medications on file prior to visit.   Past Medical History:  Diagnosis Date   Abnormal nuclear stress test 08/08/2020   Abnormal nuclear stress test 08/08/2020   Anxiety    Closed fracture of distal end of left radius 08/16/2020   Depressed left ventricular ejection fraction    Depression    Diabetes mellitus without complication ([place])    Dilated cardiomyopathy ([place]) 08/08/2020   Encounter for Medicare annual wellness exam 10/19/2019   Gastroesophageal reflux disease 10/19/2019   GERD (gastroesophageal reflux disease)    Hydropneumothorax 02/08/2021   Hyperlipidemia    Hyperlipidemia associated with type 2 diabetes mellitus ([place]) 10/19/2019   LBBB (left bundle branch block) 08/17/2020   Mitral valve prolapse    Nonischemic cardiomyopathy ([place]) 08/17/2020   Osteopenia 10/19/2019   Sinusitis 08/16/2019   Upper respiratory tract infection due to COVID-19 virus 03/18/2021   Past Surgical History:  Procedure Laterality Date   BIV ICD INSERTION CRT-D N/A 02/05/2021   Procedure: BIV ICD INSERTION CRT-D;  Surgeon: [name], MD;  Location: [facility];  Service: Cardiovascular;  Laterality: N/A;  BREAST LUMPECTOMY Left 2010   CATARACT EXTRACTION BILATERAL W/ ANTERIOR VITRECTOMY  2021   LEFT HEART CATH AND CORONARY ANGIOGRAPHY N/A 08/08/2020   Procedure: LEFT HEART CATH AND CORONARY ANGIOGRAPHY;  Surgeon: Leonie Man, MD;  Location: [facility];  Service: Cardiovascular;  Laterality: N/A;    Family History  Problem Relation Age of Onset   Osteoporosis Mother    Heart disease Father    Hyperlipidemia Father    Coronary artery disease Father    Diabetes Sister    Breast cancer Daughter    Diabetes Maternal Grandfather    Social History   Socioeconomic History   Marital status: Married    Spouse name: [name]   Number of  children: 2   Years of education: Not on file   Highest education level: Master's degree (e.g., MA, MS, MEng, MEd, MSW, MBA)  Occupational History   Occupation: Retired  Tobacco Use   Smoking status: Never   Smokeless tobacco: Never  Vaping Use   Vaping Use: Never used  Substance and Sexual Activity   Alcohol use: Yes    Alcohol/week: 2.0 standard drinks of alcohol    Types: 2 Glasses of wine per week    Comment: occasional (white wine)   Drug use: Never   Sexual activity: Not Currently  Other Topics Concern   Not on file  Social History Narrative   Son lives in [place], Daughter lives in [place]   Patient 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 mother and father as well as two aunts   Social Determinants of Health   Financial Resource Strain: Low Risk  (04/13/2021)   Overall Financial Resource Strain (CARDIA)    Difficulty of Paying Living Expenses: Not hard at all  Food Insecurity: No Food Insecurity (10/18/2020)   Hunger Vital Sign    Worried About Running Out of Food in the Last Year: Never true    [place] in the Last Year: Never true  Transportation Needs: No Transportation Needs (04/13/2021)   PRAPARE - Hydrologist (Medical): No    Lack of Transportation (Non-Medical): No  Physical Activity: Sufficiently Active (10/26/2020)   Exercise Vital Sign    Days of Exercise per Week: 6 days    Minutes of Exercise per Session: 30 min  Stress: Not on file  Social Connections: Not on file    Review of Systems  Constitutional:  Negative for chills, fatigue and fever.  HENT:  Negative for congestion, ear pain and sore throat.   Respiratory:  Negative for cough and shortness of breath.   Cardiovascular:  Positive for palpitations. Negative for chest pain.  Gastrointestinal:  Negative for abdominal pain, constipation, diarrhea, nausea and vomiting.  Endocrine: Negative for polydipsia, polyphagia and polyuria.  Genitourinary:  Negative for difficulty  urinating and dysuria.  Musculoskeletal:  Negative for arthralgias, back pain and myalgias.  Skin:  Negative for rash.  Neurological:  Negative for headaches.  Psychiatric/Behavioral:  Negative for dysphoric mood. The patient is not nervous/anxious.      Objective:  BP (!) 100/50   Pulse 78   Temp 98 F (36.7 C)   Resp 14   Ht '5\' 7"'$  (1.702 m)   Wt 134 lb (60.8 kg)   SpO2 100%   BMI 20.99 kg/m      09/24/2021    8:35 AM 08/06/2021   11:13 AM 06/25/2021   10:28 AM  BP/Weight  Systolic BP [phone]  Diastolic  BP 50 60 64  Wt. (Lbs) 134 133 132.2  BMI 20.99 kg/m2 20.83 kg/m2 20.71 kg/m2    Physical Exam Vitals reviewed.  Constitutional:      Appearance: Normal appearance. She is normal weight.  Neck:     Vascular: No carotid bruit.  Cardiovascular:     Rate and Rhythm: Normal rate and regular rhythm.     Heart sounds: Normal heart sounds.  Pulmonary:     Effort: Pulmonary effort is normal. No respiratory distress.     Breath sounds: Normal breath sounds.  Abdominal:     General: Abdomen is flat. Bowel sounds are normal.     Palpations: Abdomen is soft.     Tenderness: There is no abdominal tenderness.  Neurological:     Mental Status: She is alert and oriented to person, place, and time.  Psychiatric:        Mood and Affect: Mood normal.        Behavior: Behavior normal.     Diabetic Foot Exam - Simple   No data filed      Lab Results  Component Value Date   WBC 3.5 09/24/2021   HGB 12.1 09/24/2021   HCT 37.1 09/24/2021   PLT 244 09/24/2021   GLUCOSE 84 09/24/2021   CHOL 146 09/24/2021   TRIG 49 09/24/2021   HDL 85 09/24/2021   LDLCALC 50 09/24/2021   ALT 38 (H) 09/24/2021   AST 45 (H) 09/24/2021   NA 142 09/24/2021   K 4.5 09/24/2021   CL 105 09/24/2021   CREATININE 0.72 09/24/2021   BUN 12 09/24/2021   CO2 21 09/24/2021   TSH 1.890 08/06/2021   HGBA1C 5.5 09/24/2021      Assessment & Plan:   Problem List Items Addressed This Visit        Cardiovascular and Mediastinum   Mitral valve prolapse    Management per specialist.      Chronic systolic congestive heart failure ([place]) - Primary    Management per specialist. The current medical regimen is effective;  continue present plan and medications. Taking Entresto 24-26 mg daily.       Relevant Orders   CBC with Differential/Platelet (Completed)     Digestive   Gastroesophageal reflux disease    The current medical regimen is effective;  continue present plan and medications. Taking Omeprazole 20 mg daily        Musculoskeletal and Integument   Osteopenia    Check labs. Recommend continue to work on eating healthy diet and exercise.      Relevant Orders   VITAMIN D 25 Hydroxy (Vit-D Deficiency, Fractures) (Completed)     Other   Hyperlipidemia    Well controlled.  No changes to medicines.  Continue to work on eating a healthy diet and exercise.  Labs drawn today.       Relevant Orders   Lipid panel (Completed)   Prediabetes    Hemoglobin A1c 5.7%, 3 month avg of blood sugars, is in prediabetic range.  In order to prevent progression to diabetes, recommend low carb diet and regular exercise      Relevant Orders   Comprehensive metabolic panel (Completed)   Hemoglobin A1c (Completed)   Depression, major, recurrent, mild (HCC)    The current medical regimen is effective;  continue present plan and medications. Fluoxetine 40 mg daily. Lorazeapam rarely ever uses. Melatonin helps sleep      ICD (implantable cardioverter-defibrillator), biventricular, in situ    CONTACT CARDIOLOGY  ABOUT PALPITATIONS.      Other Visit Diagnoses     Need for hepatitis C screening test       Relevant Orders   HCV Ab w Reflex to Quant PCR (Completed)      Orders Placed This Encounter  Procedures   Comprehensive metabolic panel   Hemoglobin A1c   Lipid panel   CBC with Differential/Platelet   VITAMIN D 25 Hydroxy (Vit-D Deficiency, Fractures)   HCV Ab w  Reflex to Quant PCR    Follow-up: Return in about 6 months (around 03/27/2022) for chronic fasting.  An After Visit Summary was printed and given to the patient.  [name], MD [facility] [phone]

## 2021-09-24 ENCOUNTER — Ambulatory Visit (INDEPENDENT_AMBULATORY_CARE_PROVIDER_SITE_OTHER): Payer: HMO | Admitting: Family Medicine

## 2021-09-24 ENCOUNTER — Encounter: Payer: Self-pay | Admitting: Family Medicine

## 2021-09-24 VITALS — BP 100/50 | HR 78 | Temp 98.0°F | Resp 14 | Ht 67.0 in | Wt 134.0 lb

## 2021-09-24 DIAGNOSIS — R7303 Prediabetes: Secondary | ICD-10-CM | POA: Diagnosis not present

## 2021-09-24 DIAGNOSIS — E782 Mixed hyperlipidemia: Secondary | ICD-10-CM | POA: Diagnosis not present

## 2021-09-24 DIAGNOSIS — F330 Major depressive disorder, recurrent, mild: Secondary | ICD-10-CM | POA: Diagnosis not present

## 2021-09-24 DIAGNOSIS — I341 Nonrheumatic mitral (valve) prolapse: Secondary | ICD-10-CM

## 2021-09-24 DIAGNOSIS — Z95810 Presence of automatic (implantable) cardiac defibrillator: Secondary | ICD-10-CM | POA: Diagnosis not present

## 2021-09-24 DIAGNOSIS — M8580 Other specified disorders of bone density and structure, unspecified site: Secondary | ICD-10-CM

## 2021-09-24 DIAGNOSIS — K219 Gastro-esophageal reflux disease without esophagitis: Secondary | ICD-10-CM | POA: Diagnosis not present

## 2021-09-24 DIAGNOSIS — Z1159 Encounter for screening for other viral diseases: Secondary | ICD-10-CM | POA: Diagnosis not present

## 2021-09-24 DIAGNOSIS — I5022 Chronic systolic (congestive) heart failure: Secondary | ICD-10-CM | POA: Diagnosis not present

## 2021-09-24 NOTE — Patient Instructions (Signed)
CONTACT CARDIOLOGY ABOUT PALPITATIONS.   Needs shingrix vaccine.

## 2021-09-24 NOTE — Assessment & Plan Note (Signed)
The current medical regimen is effective;  continue present plan and medications. Taking Omeprazole 20 mg daily 

## 2021-09-24 NOTE — Assessment & Plan Note (Signed)
Management per specialist. The current medical regimen is effective;  continue present plan and medications. Taking Entresto 24-26 mg daily.  

## 2021-09-24 NOTE — Assessment & Plan Note (Signed)
Hemoglobin A1c 5.7%, 3 month avg of blood sugars, is in prediabetic range.  In order to prevent progression to diabetes, recommend low carb diet and regular exercise  

## 2021-09-24 NOTE — Assessment & Plan Note (Signed)
Check labs. Recommend continue to work on eating healthy diet and exercise. ° °

## 2021-09-24 NOTE — Assessment & Plan Note (Signed)
Management per specialist. 

## 2021-09-24 NOTE — Assessment & Plan Note (Signed)
CONTACT CARDIOLOGY ABOUT PALPITATIONS.

## 2021-09-24 NOTE — Assessment & Plan Note (Signed)
>>  ASSESSMENT AND PLAN FOR PREDIABETES WRITTEN ON 09/24/2021  9:18 AM BY LEAL-BORJAS, [place] I, CMA  Hemoglobin A1c 5.7%, 3 month avg of blood sugars, is in prediabetic range.  In order to prevent progression to diabetes, recommend low carb diet and regular exercise

## 2021-09-24 NOTE — Assessment & Plan Note (Signed)
Well controlled.  ?No changes to medicines.  ?Continue to work on eating a healthy diet and exercise.  ?Labs drawn today.  ?

## 2021-09-24 NOTE — Assessment & Plan Note (Signed)
The current medical regimen is effective;  continue present plan and medications. Fluoxetine 40 mg daily. Lorazeapam rarely ever uses. Melatonin helps sleep

## 2021-09-26 ENCOUNTER — Other Ambulatory Visit: Payer: Self-pay

## 2021-09-26 ENCOUNTER — Telehealth: Payer: Self-pay

## 2021-09-26 MED ORDER — FLUOXETINE HCL 40 MG PO CAPS: 40.0000 mg | ORAL_CAPSULE | Freq: Every day | ORAL | 1 refills | Status: DC

## 2021-09-26 NOTE — Progress Notes (Signed)
Chronic Care Management Pharmacy Assistant   Name: Audrey Ruiz  MRN: [id number] DOB: [birth year]   Reason for Encounter: Medication Coordination for [place]    Recent office visits:  09/24/21 [name] MD. Seen for routine visit. No med changes.  08/06/21 [name] MD. Seen for fatigue. D/C Metoprolol Succinate 12.'5mg'$  and Spironolactone 12.'5mg'$ .  Recent consult visits:  None  Hospital visits:  None  Medications: Outpatient Encounter Medications as of 09/26/2021  Medication Sig   cetirizine (ZYRTEC) 10 MG tablet Take 10 mg by mouth daily.   ezetimibe (ZETIA) 10 MG tablet Take 1 tablet (10 mg total) by mouth daily.   FLUoxetine (PROZAC) 40 MG capsule Take 1 capsule (40 mg total) by mouth daily.   fluticasone (FLONASE) 50 MCG/ACT nasal spray Use 2 spray(s) in each nostril once daily   Glucosamine-Chondroitin-MSM 1500-1200-500 MG PACK Take 1 tablet by mouth daily.   ibuprofen (ADVIL) 200 MG tablet Take 200 mg by mouth every 8 (eight) hours as needed for moderate pain.   LORazepam (ATIVAN) 0.5 MG tablet Take 1 tablet (0.5 mg total) by mouth at bedtime as needed for anxiety.   Melatonin 10 MG CAPS Take 5 mg by mouth at bedtime.   Multiple Vitamin (MULTIVITAMIN) capsule Take 1 capsule by mouth daily.   Omega-3 Fatty Acids (FISH OIL) 1000 MG CAPS Take 1,000 mg by mouth 2 (two) times daily.   omeprazole (PRILOSEC) 20 MG capsule Take 1 capsule (20 mg total) by mouth daily.   rosuvastatin (CRESTOR) 40 MG tablet Take 1 tablet (40 mg total) by mouth daily.   sacubitril-valsartan (ENTRESTO) 24-26 MG Take 1 tablet by mouth 2 (two) times daily.   Vitamin D, Cholecalciferol, 25 MCG (1000 UT) CAPS Take 1,000 Units by mouth daily.   No facility-administered encounter medications on file as of 09/26/2021.    Reviewed chart for medication changes ahead of medication coordination call.  No Consults, or hospital visits since last care coordination call/Pharmacist visit.    BP  Readings from Last 3 Encounters:  09/24/21 (!) 100/50  08/06/21 110/60  06/25/21 133/64    Lab Results  Component Value Date   HGBA1C 5.7 (H) 12/25/2020     Patient obtains medications through Adherence Packaging  90 Days   Last adherence delivery included:  Flonase Nasal Spray 77mg- 2 sprays each nostril daily       Cetirizine '10mg'$  1 at B Ezetimibe '10mg'$  1 at BT Omeprazole '20mg'$ - 1 B Rosuvastatin '40mg'$  1 at BT Entresto 24-'26mg'$  1 at B 1 at BT Vitamin D3 260m 1 at B Fish Oil '1000mg'$  1 at B and 1 at BT Fluoxetine '40mg'$ - 1 at B  Patient declined (meds) last delivery Metoprolol '25mg'$ - D/C by Cardiologist due to low BP Spironolactone '25mg'$ - D/C by Cardiologist due to low BP Melatonin '10mg'$ - Wants to continue getting at [facility] Lorazepam 0.'5mg'$ - Pt only uses PRN and still has plenty on hand Glucosamine-Chondroitin 1500-1200  Patient is due for next adherence delivery on: 10/08/21. Called patient and reviewed medications and coordinated delivery.  This delivery to include: Flonase Nasal Spray 5083m 2 sprays each nostril daily       Cetirizine '10mg'$  1 at B Ezetimibe '10mg'$  1 at BT Omeprazole '20mg'$ - 1 B Rosuvastatin '40mg'$  1 at BT Entresto 24-'26mg'$  1 at B 1 at BT Vitamin D3 42m62m at B Fish Oil '1000mg'$  1 at B and 1 at BT Fluoxetine '40mg'$ - 1 at B  Patient declined the following medications  Melatonin '10mg'$ -  Wants to continue getting at [facility] Lorazepam 0.'5mg'$ - Pt only uses PRN and still has plenty on hand Glucosamine-Chondroitin 1500-1200  Patient needs refills-Request Sent  Fluoxetine '40mg'$   Confirmed delivery date of 10/08/21, advised patient that pharmacy will contact them the morning of delivery.   [name], [place] Pharmacist Assistant  [phone]

## 2021-09-27 LAB — CBC WITH DIFFERENTIAL/PLATELET
Basophils Absolute: 0.1 10*3/uL (ref 0.0–0.2)
Basos: 2 %
EOS (ABSOLUTE): 0.2 10*3/uL (ref 0.0–0.4)
Eos: 5 %
Hematocrit: 37.1 % (ref 34.0–46.6)
Hemoglobin: 12.1 g/dL (ref 11.1–15.9)
Immature Grans (Abs): 0 10*3/uL (ref 0.0–0.1)
Immature Granulocytes: 0 %
Lymphocytes Absolute: 1.5 10*3/uL (ref 0.7–3.1)
Lymphs: 42 %
MCH: 32 pg (ref 26.6–33.0)
MCHC: 32.6 g/dL (ref 31.5–35.7)
MCV: 98 fL — ABNORMAL HIGH (ref 79–97)
Monocytes Absolute: 0.2 10*3/uL (ref 0.1–0.9)
Monocytes: 7 %
Neutrophils Absolute: 1.6 10*3/uL (ref 1.4–7.0)
Neutrophils: 44 %
Platelets: 244 10*3/uL (ref 150–450)
RBC: 3.78 x10E6/uL (ref 3.77–5.28)
RDW: 13.5 % (ref 11.7–15.4)
WBC: 3.5 10*3/uL (ref 3.4–10.8)

## 2021-09-27 LAB — COMPREHENSIVE METABOLIC PANEL
ALT: 38 IU/L — ABNORMAL HIGH (ref 0–32)
AST: 45 IU/L — ABNORMAL HIGH (ref 0–40)
Albumin/Globulin Ratio: 2 (ref 1.2–2.2)
Albumin: 4.3 g/dL (ref 3.8–4.8)
Alkaline Phosphatase: 50 IU/L (ref 44–121)
BUN/Creatinine Ratio: 17 (ref 12–28)
BUN: 12 mg/dL (ref 8–27)
Bilirubin Total: 0.9 mg/dL (ref 0.0–1.2)
CO2: 21 mmol/L (ref 20–29)
Calcium: 10.7 mg/dL — ABNORMAL HIGH (ref 8.7–10.3)
Chloride: 105 mmol/L (ref 96–106)
Creatinine, Ser: 0.72 mg/dL (ref 0.57–1.00)
Globulin, Total: 2.2 g/dL (ref 1.5–4.5)
Glucose: 84 mg/dL (ref 70–99)
Potassium: 4.5 mmol/L (ref 3.5–5.2)
Sodium: 142 mmol/L (ref 134–144)
Total Protein: 6.5 g/dL (ref 6.0–8.5)
eGFR: 89 mL/min/{1.73_m2} (ref >59)

## 2021-09-27 LAB — VITAMIN D 25 HYDROXY (VIT D DEFICIENCY, FRACTURES): Vit D, 25-Hydroxy: 56.7 ng/mL (ref 30.0–100.0)

## 2021-09-27 LAB — HEMOGLOBIN A1C
Est. average glucose Bld gHb Est-mCnc: 111 mg/dL
Hgb A1c MFr Bld: 5.5 % (ref 4.8–5.6)

## 2021-09-27 LAB — LIPID PANEL
Chol/HDL Ratio: 1.7 ratio (ref 0.0–4.4)
Cholesterol, Total: 146 mg/dL (ref 100–199)
HDL: 85 mg/dL (ref >39)
LDL Chol Calc (NIH): 50 mg/dL (ref 0–99)
Triglycerides: 49 mg/dL (ref 0–149)
VLDL Cholesterol Cal: 11 mg/dL (ref 5–40)

## 2021-09-27 LAB — HCV AB W REFLEX TO QUANT PCR: HCV Ab: NONREACTIVE

## 2021-09-28 ENCOUNTER — Other Ambulatory Visit: Payer: Self-pay

## 2021-10-01 ENCOUNTER — Other Ambulatory Visit: Payer: HMO

## 2021-10-02 LAB — PTH, INTACT AND CALCIUM
Calcium: 10 mg/dL (ref 8.7–10.3)
PTH: 49 pg/mL (ref 15–65)

## 2021-10-03 NOTE — Progress Notes (Signed)
Calcium and pth normal.

## 2021-10-30 ENCOUNTER — Ambulatory Visit (INDEPENDENT_AMBULATORY_CARE_PROVIDER_SITE_OTHER): Payer: HMO

## 2021-10-30 VITALS — BP 104/60 | HR 78 | Resp 16 | Ht 67.0 in | Wt 134.2 lb

## 2021-10-30 DIAGNOSIS — Z23 Encounter for immunization: Secondary | ICD-10-CM

## 2021-10-30 DIAGNOSIS — Z0000 Encounter for general adult medical examination without abnormal findings: Secondary | ICD-10-CM

## 2021-10-30 NOTE — Progress Notes (Signed)
Subjective:   Audrey Ruiz is a 71 y.o. female who presents for Medicare Annual (Subsequent) preventive examination.  This wellness visit is conducted by a nurse.  The patient's medications were reviewed and reconciled since the patient's last visit.  History details were provided by the patient.  The history appears to be reliable.    Medical History: Patient history and Family history was reviewed  Medications, Allergies, and preventative health maintenance was reviewed and updated.  Cardiac Risk Factors include: advanced age (>51mn, >>38women)     Objective:    Today's Vitals   10/30/21 1420  BP: 104/60  Pulse: 78  Resp: 16  Weight: 134 lb 3.2 oz (60.9 kg)  Height: '5\' 7"'$  (1.702 m)  PainSc: 0-No pain   Body mass index is 21.02 kg/m.     02/05/2021   10:42 AM 10/26/2020   10:47 AM 08/08/2020    8:40 AM  Advanced Directives  Does Patient Have a Medical Advance Directive? Yes Yes Yes  Type of AParamedicof [place] will [place] Does patient want to make changes to medical advance directive? No - Patient declined No - Patient declined   Copy of HOkeenein Chart? No - copy requested No - copy requested     Current Medications (verified) Outpatient Encounter Medications as of 10/30/2021  Medication Sig   cetirizine (ZYRTEC) 10 MG tablet Take 10 mg by mouth daily.   ezetimibe (ZETIA) 10 MG tablet Take 1 tablet (10 mg total) by mouth daily.   FLUoxetine (PROZAC) 40 MG capsule Take 1 capsule (40 mg total) by mouth daily.   fluticasone (FLONASE) 50 MCG/ACT nasal spray Use 2 spray(s) in each nostril once daily   Glucosamine-Chondroitin-MSM 1500-1200-500 MG PACK Take 1 tablet by mouth daily.   ibuprofen (ADVIL) 200 MG tablet Take 200 mg by mouth every 8 (eight) hours as needed for moderate pain.   LORazepam (ATIVAN) 0.5 MG tablet Take 1 tablet (0.5 mg total) by mouth  at bedtime as needed for anxiety.   Melatonin 10 MG CAPS Take 5 mg by mouth at bedtime.   Multiple Vitamin (MULTIVITAMIN) capsule Take 1 capsule by mouth daily.   Omega-3 Fatty Acids (FISH OIL) 1000 MG CAPS Take 1,000 mg by mouth 2 (two) times daily.   omeprazole (PRILOSEC) 20 MG capsule Take 1 capsule (20 mg total) by mouth daily.   rosuvastatin (CRESTOR) 40 MG tablet Take 1 tablet (40 mg total) by mouth daily.   sacubitril-valsartan (ENTRESTO) 24-26 MG Take 1 tablet by mouth 2 (two) times daily.   Vitamin D, Cholecalciferol, 25 MCG (1000 UT) CAPS Take 1,000 Units by mouth daily.   No facility-administered encounter medications on file as of 10/30/2021.    Allergies (verified) Patient has no known allergies.   History: Past Medical History:  Diagnosis Date   Abnormal nuclear stress test 08/08/2020   Abnormal nuclear stress test 08/08/2020   Anxiety    Closed fracture of distal end of left radius 08/16/2020   Depressed left ventricular ejection fraction    Depression    Diabetes mellitus without complication ([facility]    Dilated cardiomyopathy ([place] 08/08/2020   Encounter for Medicare annual wellness exam 10/19/2019   Gastroesophageal reflux disease 10/19/2019   GERD (gastroesophageal reflux disease)    Hydropneumothorax 02/08/2021   Hyperlipidemia    Hyperlipidemia associated with type 2 diabetes mellitus ([place] 10/19/2019   LBBB (left bundle branch  block) 08/17/2020   Mitral valve prolapse    Nonischemic cardiomyopathy ([place]) 08/17/2020   Osteopenia 10/19/2019   Sinusitis 08/16/2019   Upper respiratory tract infection due to COVID-19 virus 03/18/2021   Past Surgical History:  Procedure Laterality Date   BIV ICD INSERTION CRT-D N/A 02/05/2021   Procedure: BIV ICD INSERTION CRT-D;  Surgeon: [name], MD;  Location: [facility];  Service: Cardiovascular;  Laterality: N/A;   BREAST LUMPECTOMY Left 2010   CATARACT EXTRACTION BILATERAL W/ ANTERIOR VITRECTOMY  2021    LEFT HEART CATH AND CORONARY ANGIOGRAPHY N/A 08/08/2020   Procedure: LEFT HEART CATH AND CORONARY ANGIOGRAPHY;  Surgeon: Leonie Man, MD;  Location: [facility];  Service: Cardiovascular;  Laterality: N/A;   Family History  Problem Relation Age of Onset   Osteoporosis Mother    Heart disease Father    Hyperlipidemia Father    Coronary artery disease Father    Diabetes Sister    Breast cancer Daughter    Diabetes Maternal Grandfather    Social History   Socioeconomic History   Marital status: Married    Spouse name: [name]   Number of children: 2   Years of education: Not on file   Highest education level: Master's degree (e.g., MA, MS, MEng, MEd, MSW, MBA)  Occupational History   Occupation: Retired  Tobacco Use   Smoking status: Never   Smokeless tobacco: Never  Vaping Use   Vaping Use: Never used  Substance and Sexual Activity   Alcohol use: Yes    Alcohol/week: 2.0 standard drinks of alcohol    Types: 2 Glasses of wine per week    Comment: occasional (white wine)   Drug use: Never   Sexual activity: Not Currently  Other Topics Concern   Not on file  Social History Narrative   Son lives in [place], Daughter lives in [place]   Patient 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 mother and father as well as two aunts   Social Determinants of Health   Financial Resource Strain: Low Risk  (04/13/2021)   Overall Financial Resource Strain (CARDIA)    Difficulty of Paying Living Expenses: Not hard at all  Food Insecurity: No Food Insecurity (10/18/2020)   Hunger Vital Sign    Worried About Running Out of Food in the Last Year: Never true    [place] in the Last Year: Never true  Transportation Needs: No Transportation Needs (04/13/2021)   PRAPARE - Hydrologist (Medical): No    Lack of Transportation (Non-Medical): No  Physical Activity: Sufficiently Active (10/26/2020)   Exercise Vital Sign    Days of Exercise per Week: 6 days    Minutes  of Exercise per Session: 30 min  Stress: Not on file  Social Connections: Not on file    Tobacco Counseling Counseling given: Not Answered   Clinical Intake:  Pre-visit preparation completed: Yes  Pain : No/denies pain Pain Score: 0-No pain     BMI - recorded: 21.02 Nutritional Status: BMI of 19-24  Normal Nutritional Risks: None Diabetes: No  How often do you need to have someone help you when you read instructions, pamphlets, or other written materials from your doctor or pharmacy?: 1 - Never Interpreter Needed?: No   Activities of Daily Living    10/30/2021    2:30 PM  In your present state of health, do you have any difficulty performing the following activities:  Hearing? 0  Vision?  0  Difficulty concentrating or making decisions? 0  Walking or climbing stairs? 0  Dressing or bathing? 0  Doing errands, shopping? 0  Preparing Food and eating ? N  Using the Toilet? N  In the past six months, have you accidently leaked urine? Y  Do you have problems with loss of bowel control? N  Managing your Medications? N  Managing your Finances? N  Housekeeping or managing your Housekeeping? N    Patient Care Team: [name], MD as PCP - General (Family Medicine) Berniece Salines, DO as PCP - Cardiology (Cardiology) Marice Potter, MD as Consulting Physician (Oncology) Berniece Salines, DO as Consulting Physician (Cardiology) Brynda Peon, PA as Physician Assistant (Orthopedic Surgery) Lynnell Dike, [place] (Optometry) Lane Hacker, [facility] (Pharmacist)      Assessment:   This is a routine wellness examination for Abrial.  Hearing/Vision screen No results found.  Dietary issues and exercise activities discussed: Current Exercise Habits: Home exercise routine, Type of exercise: walking, Time (Minutes): 40, Frequency (Times/Week): 6, Weekly Exercise (Minutes/Week): 240, Intensity: Mild   Goals Addressed   None    Depression Screen    10/30/2021    2:30  PM 08/06/2021   11:21 AM 12/25/2020    8:33 AM 10/26/2020   10:41 AM 09/21/2020    8:12 AM 06/01/2020    9:34 AM 10/19/2019    8:16 AM  PHQ 2/9 Scores  PHQ - 2 Score 0 2 0 0 0 2 0  PHQ- 9 Score  '6 3   3 1    '$ Fall Risk    10/30/2021    2:29 PM 10/26/2020   10:48 AM 09/30/2020    9:12 PM 09/21/2020    8:12 AM 10/19/2019    8:15 AM  Fall Risk   Falls in the past year? 0 1  1 0  Number falls in past yr: 0 1  1 0  Injury with Fall? 0 1  1 0  Risk for fall due to : No Fall Risks History of fall(s) History of fall(s)    Follow up Falls evaluation completed;Education provided Falls evaluation completed;Education provided Falls evaluation completed;Falls prevention discussed      FALL RISK PREVENTION PERTAINING TO THE HOME:  Any stairs in or around the home? Yes  If so, are there any without handrails? No  Home free of loose throw rugs in walkways, pet beds, electrical cords, etc? Yes  Adequate lighting in your home to reduce risk of falls? Yes   ASSISTIVE DEVICES UTILIZED TO PREVENT FALLS:  Life alert? No  Use of a cane, walker or w/c? No  Grab bars in the bathroom? No  Shower chair or bench in shower? No  Elevated toilet seat or a handicapped toilet? No   Gait steady and fast without assistive device  Cognitive Function:        10/30/2021    2:31 PM 10/26/2020   10:49 AM  6CIT Screen  What Year? 0 points 0 points  What month? 0 points 0 points  What time? 0 points 0 points  Count back from 20 0 points 0 points  Months in reverse 0 points 0 points  Repeat phrase 0 points 0 points  Total Score 0 points 0 points    Immunizations Immunization History  Administered Date(s) Administered   Fluad Quad(high Dose 65+) 10/07/2018, 10/20/2019, 10/19/2020, 10/30/2021   Influenza, High Dose Seasonal PF 12/02/2017   Influenza, Seasonal, Injecte, Preservative Fre 11/22/2013   Influenza,inj,Quad PF,6-35  Mos 11/26/2012   Influenza-Unspecified 11/27/2009, 11/22/2010, 10/28/2011, 11/21/2014,  12/14/2015, 10/17/2016   Moderna Sars-Covid-2 Vaccination 04/16/2019, 05/17/2019, 01/17/2020   Pfizer Covid-19 Vaccine Bivalent Booster 46yr & up 12/25/2020   Pneumococcal Conjugate-13 12/22/2014   Pneumococcal Polysaccharide-23 03/22/2010, 10/19/2020   Tdap 11/30/2012    TDAP status: Up to date  Flu Vaccine status: Completed at today's visit  Pneumococcal vaccine status: Up to date  Covid-19 vaccine status: Information provided on how to obtain vaccines.   Qualifies for Shingles Vaccine? Yes   Zostavax completed No   Shingrix Completed?: No.    Education has been provided regarding the importance of this vaccine. Patient has been advised to call insurance company to determine out of pocket expense if they have not yet received this vaccine. Advised may also receive vaccine at local pharmacy or Health Dept. Verbalized acceptance and understanding.  Screening Tests Health Maintenance  Topic Date Due   FOOT EXAM  Never done   OPHTHALMOLOGY EXAM  Never done   Zoster Vaccines- Shingrix (1 of 2) Never done   COVID-19 Vaccine (5 - Moderna risk series) 02/19/2021   HEMOGLOBIN A1C  03/27/2022   TETANUS/TDAP  12/01/2022   MAMMOGRAM  06/28/2023   COLONOSCOPY (Pts [id number] coverage will need to be confirmed)  06/22/2026   Pneumonia Vaccine 6545Years old  Completed   INFLUENZA VACCINE  Completed   DEXA SCAN  Completed   Hepatitis C Screening  Completed   HPV VACCINES  Aged Out    Health Maintenance  Health Maintenance Due  Topic Date Due   FOOT EXAM  Never done   OPHTHALMOLOGY EXAM  Never done   Zoster Vaccines- Shingrix (1 of 2) Never done   COVID-19 Vaccine (5 - Moderna risk series) 02/19/2021    Colorectal cancer screening: Type of screening: Colonoscopy. Completed 06/21/16. Repeat every 10 years  Mammogram status: Completed 06/27/21. Repeat every year  Lung Cancer Screening: (Low Dose CT Chest recommended if Age 73-80ears, 30 pack-year currently smoking OR have  quit w/in 15years.) does not qualify.   Additional Screening:  Vision Screening: Recommended annual ophthalmology exams for early detection of glaucoma and other disorders of the eye. Is the patient up to date with their annual eye exam?  No  Who is the provider or what is the name of the office in which the patient attends annual eye exams? Dr SnRenaldo FiddlerDental Screening: Recommended annual dental exams for proper oral hygiene  Community Resource Referral / Chronic Care Management: CRR required this visit?  No   CCM required this visit?  No      Plan:    1- Get Shingrix vaccine at the pharmacy 2- Call to schedule annual eye exam with Dr SnRenaldo FiddlerI have personally reviewed and noted the following in the patient's chart:   Medical and social history Use of alcohol, tobacco or illicit drugs  Current medications and supplements including opioid prescriptions. Patient is not currently taking opioid prescriptions. Functional ability and status Nutritional status Physical activity Advanced directives List of other physicians Hospitalizations, surgeries, and ER visits in previous 12 months Vitals Screenings to include cognitive, depression, and falls Referrals and appointments  In addition, I have reviewed and discussed with patient certain preventive protocols, quality metrics, and best practice recommendations. A written personalized care plan for preventive services as well as general preventive health recommendations were provided to patient.     KiErie NoeLPN   10/20/30/4401

## 2021-10-30 NOTE — Patient Instructions (Signed)
Audrey Ruiz , Thank you for taking time to come for your Medicare Wellness Visit. I appreciate your ongoing commitment to your health goals. Please review the following plan we discussed and let me know if I can assist you in the future.   Screening recommendations/referrals: Colonoscopy: Done 06/21/16 Mammogram: Done 06/27/21 - recommended yearly Bone Density: Done 03/18/2018 Recommended yearly ophthalmology/optometry visit for glaucoma screening and checkup Recommended yearly dental visit for hygiene and checkup  Vaccinations: Influenza vaccine: completed today Pneumococcal vaccine: up to date Tdap vaccine: due next October Shingles vaccine: Due - you can get this at the pharmacy    Advanced directives: Please bring a copy for your medical record    Preventive Care 65 Years and Older, Female   Preventive care refers to lifestyle choices and visits with your health care provider that can promote health and wellness.  What does preventive care include? A yearly physical exam. This is also called an annual well check. Dental exams once or twice a year. Routine eye exams. Ask your health care provider how often you should have your eyes checked. Personal lifestyle choices, including: Daily care of your teeth and gums. Regular physical activity. Eating a healthy diet. Avoiding tobacco and drug use. Limiting alcohol use. Practicing safe sex. Taking low-dose aspirin every day. Taking vitamin and mineral supplements as recommended by your health care provider.  What happens during an annual well check? The services and screenings done by your health care provider during your annual well check will depend on your age, overall health, lifestyle risk factors, and family history of disease.  Counseling Your health care provider may ask you questions about your: Alcohol use. Tobacco use. Drug use. Emotional well-being. Home and relationship well-being. Sexual activity. Eating  habits. History of falls. Memory and ability to understand (cognition). Work and work Statistician. Reproductive health.  Screening You may have the following tests or measurements: Height, weight, and BMI. Blood pressure. Lipid and cholesterol levels. These may be checked every 5 years, or more frequently if you are over 53 years old. Skin check. Lung cancer screening. You may have this screening every year starting at age 84 if you have a 30-pack-year history of smoking and currently smoke or have quit within the past 15 years. Fecal occult blood test (FOBT) of the stool. You may have this test every year starting at age 73. Flexible sigmoidoscopy or colonoscopy. You may have a sigmoidoscopy every 5 years or a colonoscopy every 10 years starting at age 54. Hepatitis C blood test. Hepatitis B blood test. Sexually transmitted disease (STD) testing. Diabetes screening. This is done by checking your blood sugar (glucose) after you have not eaten for a while (fasting). You may have this done every 1-3 years. Bone density scan. This is done to screen for osteoporosis. You may have this done starting at age 30. Mammogram. This may be done every 1-2 years. Talk to your health care provider about how often you should have regular mammograms. Talk with your health care provider about your test results, treatment options, and if necessary, the need for more tests.  Vaccines Your health care provider may recommend certain vaccines, such as: Influenza vaccine. This is recommended every year. Tetanus, diphtheria, and acellular pertussis (Tdap, Td) vaccine. You may need a Td booster every 10 years. Zoster vaccine. You may need this after age 52. Pneumococcal 13-valent conjugate (PCV13) vaccine. One dose is recommended after age 57. Pneumococcal polysaccharide (PPSV23) vaccine. One dose is recommended after age 51.  Talk to your health care provider about which screenings and vaccines you need and how  often you need them.  This information is not intended to replace advice given to you by your health care provider. Make sure you discuss any questions you have with your health care provider. Document Released: 03/03/2015 Document Revised: 10/25/2015 Document Reviewed: 12/06/2014 Elsevier Interactive Patient Education  2017 [place] Prevention in the Home  Falls can cause injuries. They can happen to people of all ages. There are many things you can do to make your home safe and to help prevent falls.  What can I do on the outside of my home? Regularly fix the edges of walkways and driveways and fix any cracks. Remove anything that might make you trip as you walk through a door, such as a raised step or threshold. Trim any bushes or trees on the path to your home. Use bright outdoor lighting. Clear any walking paths of anything that might make someone trip, such as rocks or tools. Regularly check to see if handrails are loose or broken. Make sure that both sides of any steps have handrails. Any raised decks and porches should have guardrails on the edges. Have any leaves, snow, or ice cleared regularly. Use sand or salt on walking paths during winter. Clean up any spills in your garage right away. This includes oil or grease spills.  What can I do in the bathroom? Use night lights. Install grab bars by the toilet and in the tub and shower. Do not use towel bars as grab bars. Use non-skid mats or decals in the tub or shower. If you need to sit down in the shower, use a plastic, non-slip stool. Keep the floor dry. Clean up any water that spills on the floor as soon as it happens. Remove soap buildup in the tub or shower regularly. Attach bath mats securely with double-sided non-slip rug tape. Do not have throw rugs and other things on the floor that can make you trip.  What can I do in the bedroom? Use night lights. Make sure that you have a light by your bed that is  easy to reach. Do not use any sheets or blankets that are too big for your bed. They should not hang down onto the floor. Have a firm chair that has side arms. You can use this for support while you get dressed. Do not have throw rugs and other things on the floor that can make you trip.  What can I do in the kitchen? Clean up any spills right away. Avoid walking on wet floors. Keep items that you use a lot in easy-to-reach places. If you need to reach something above you, use a strong step stool that has a grab bar. Keep electrical cords out of the way. Do not use floor polish or wax that makes floors slippery. If you must use wax, use non-skid floor wax. Do not have throw rugs and other things on the floor that can make you trip.  What can I do with my stairs? Do not leave any items on the stairs. Make sure that there are handrails on both sides of the stairs and use them. Fix handrails that are broken or loose. Make sure that handrails are as long as the stairways. Check any carpeting to make sure that it is firmly attached to the stairs. Fix any carpet that is loose or worn. Avoid having throw rugs at the top or bottom  of the stairs. If you do have throw rugs, attach them to the floor with carpet tape. Make sure that you have a light switch at the top of the stairs and the bottom of the stairs. If you do not have them, ask someone to add them for you.  What else can I do to help prevent falls? Wear shoes that: Do not have high heels. Have rubber bottoms. Are comfortable and fit you well. Are closed at the toe. Do not wear sandals. If you use a stepladder: Make sure that it is fully opened. Do not climb a closed stepladder. Make sure that both sides of the stepladder are locked into place. Ask someone to hold it for you, if possible. Clearly mark and make sure that you can see: Any grab bars or handrails. First and last steps. Where the edge of each step is. Use tools that help  you move around (mobility aids) if they are needed. These include: Canes. Walkers. Scooters. Crutches. Turn on the lights when you go into a dark area. Replace any light bulbs as soon as they burn out. Set up your furniture so you have a clear path. Avoid moving your furniture around. If any of your floors are uneven, fix them. If there are any pets around you, be aware of where they are. Review your medicines with your doctor. Some medicines can make you feel dizzy. This can increase your chance of falling. Ask your doctor what other things that you can do to help prevent falls.  This information is not intended to replace advice given to you by your health care provider. Make sure you discuss any questions you have with your health care provider. Document Released: 12/01/2008 Document Revised: 07/13/2015 Document Reviewed: 03/11/2014 Elsevier Interactive Patient Education  2017 [employer].

## 2021-11-06 ENCOUNTER — Ambulatory Visit (INDEPENDENT_AMBULATORY_CARE_PROVIDER_SITE_OTHER): Payer: HMO

## 2021-11-06 DIAGNOSIS — I428 Other cardiomyopathies: Secondary | ICD-10-CM

## 2021-11-06 LAB — CUP PACEART REMOTE DEVICE CHECK
Battery Remaining Longevity: 84 mo
Battery Remaining Percentage: 87 %
Battery Voltage: 2.98 V
Brady Statistic AP VP Percent: 1 %
Brady Statistic AP VS Percent: 1 %
Brady Statistic AS VP Percent: 95 %
Brady Statistic AS VS Percent: 2.3 %
Brady Statistic RA Percent Paced: 1 %
Date Time Interrogation Session: 20230919020043
HighPow Impedance: 62 Ohm
Implantable Lead Implant Date: 20221219
Implantable Lead Implant Date: 20221219
Implantable Lead Implant Date: 20221219
Implantable Lead Location: 753858
Implantable Lead Location: 753859
Implantable Lead Location: 753860
Implantable Pulse Generator Implant Date: 20221219
Lead Channel Impedance Value: 360 Ohm
Lead Channel Impedance Value: 440 Ohm
Lead Channel Impedance Value: 790 Ohm
Lead Channel Pacing Threshold Amplitude: 0.75 V
Lead Channel Pacing Threshold Amplitude: 0.75 V
Lead Channel Pacing Threshold Amplitude: 1.5 V
Lead Channel Pacing Threshold Pulse Width: 0.5 ms
Lead Channel Pacing Threshold Pulse Width: 0.5 ms
Lead Channel Pacing Threshold Pulse Width: 0.5 ms
Lead Channel Sensing Intrinsic Amplitude: 12 mV
Lead Channel Sensing Intrinsic Amplitude: 2.9 mV
Lead Channel Setting Pacing Amplitude: 1.75 V
Lead Channel Setting Pacing Amplitude: 2 V
Lead Channel Setting Pacing Amplitude: 2 V
Lead Channel Setting Pacing Pulse Width: 0.5 ms
Lead Channel Setting Pacing Pulse Width: 0.5 ms
Lead Channel Setting Sensing Sensitivity: 0.5 mV
Pulse Gen Serial Number: 111053351

## 2021-11-20 NOTE — Progress Notes (Signed)
Remote ICD transmission.   

## 2021-11-29 DIAGNOSIS — I495 Sick sinus syndrome: Secondary | ICD-10-CM | POA: Diagnosis not present

## 2021-11-29 DIAGNOSIS — F3342 Major depressive disorder, recurrent, in full remission: Secondary | ICD-10-CM | POA: Diagnosis not present

## 2021-11-29 DIAGNOSIS — Z6820 Body mass index (BMI) 20.0-20.9, adult: Secondary | ICD-10-CM | POA: Diagnosis not present

## 2021-11-29 DIAGNOSIS — D692 Other nonthrombocytopenic purpura: Secondary | ICD-10-CM | POA: Diagnosis not present

## 2021-11-29 DIAGNOSIS — Z9221 Personal history of antineoplastic chemotherapy: Secondary | ICD-10-CM | POA: Diagnosis not present

## 2021-11-29 DIAGNOSIS — I509 Heart failure, unspecified: Secondary | ICD-10-CM | POA: Diagnosis not present

## 2021-11-29 DIAGNOSIS — Z9012 Acquired absence of left breast and nipple: Secondary | ICD-10-CM | POA: Diagnosis not present

## 2021-11-29 DIAGNOSIS — Z853 Personal history of malignant neoplasm of breast: Secondary | ICD-10-CM | POA: Diagnosis not present

## 2021-11-29 DIAGNOSIS — K219 Gastro-esophageal reflux disease without esophagitis: Secondary | ICD-10-CM | POA: Diagnosis not present

## 2021-11-29 DIAGNOSIS — I110 Hypertensive heart disease with heart failure: Secondary | ICD-10-CM | POA: Diagnosis not present

## 2021-11-29 DIAGNOSIS — Z923 Personal history of irradiation: Secondary | ICD-10-CM | POA: Diagnosis not present

## 2021-11-29 DIAGNOSIS — E785 Hyperlipidemia, unspecified: Secondary | ICD-10-CM | POA: Diagnosis not present

## 2021-12-07 ENCOUNTER — Ambulatory Visit (INDEPENDENT_AMBULATORY_CARE_PROVIDER_SITE_OTHER): Payer: HMO

## 2021-12-07 NOTE — Patient Instructions (Signed)
Visit Information   Goals Addressed   None    There are no care plans to display for this patient.   Audrey Ruiz was given information about Chronic Care Management services today including:  CCM service includes personalized support from designated clinical staff supervised by her physician, including individualized plan of care and coordination with other care providers 24/7 contact phone numbers for assistance for urgent and routine care needs. Standard insurance, coinsurance, copays and deductibles apply for chronic care management only during months in which we provide at least 20 minutes of these services. Most insurances cover these services at 100%, however patients may be responsible for any copay, coinsurance and/or deductible if applicable. This service may help you avoid the need for more expensive face-to-face services. Only one practitioner may furnish and bill the service in a calendar month. The patient may stop CCM services at any time (effective at the end of the month) by phone call to the office staff.  Patient agreed to services and verbal consent obtained.   The patient verbalized understanding of instructions, educational materials, and care plan provided today and DECLINED offer to receive copy of patient instructions, educational materials, and care plan.  The pharmacy team will reach out to the patient again over the next 30 days.   Audrey Ruiz, [facility]

## 2021-12-07 NOTE — Progress Notes (Signed)
Chronic Care Management Pharmacy Note  12/07/2021 Name:  Audrey Ruiz MRN:  [id number] DOB:  Jun 13, 1950   Summary: Pleasant 71 year old female presents for f/u CCM visit. She has a degree in Psych and a Masters in [employer]. 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 Because of this, she was able to travel to [place], [place], and [place] (And all over the country). Her husband worked for a Theatre stage manager. They have 2 children. [name] is a Theme park manager and defending her doctorate on 04/13/21. [name] is working with a Theatre stage manager. Danelle takes care of both of her parents which is very stressful. Her husband was also recently Dx with Early Onset Alzheimers which is causing a lot of anxiety as well. She feels overwhelmed taking care of the healthcare of so many people.  Update: Daughter defended thesis and is now a doctor!   Recommendations: Due for DEXA Scan   Subjective: Audrey Ruiz is an 71 y.o. year old female who is a primary patient of Cox, Kirsten, MD.  The CCM team was consulted for assistance with disease management and care coordination needs.    Engaged with patient face to face for follow up visit in response to provider referral for pharmacy case management and/or care coordination services.   Consent to Services:  The patient was given the following information about Chronic Care Management services today, agreed to services, and gave verbal consent: 1. CCM service includes personalized support from designated clinical staff supervised by the primary care provider, including individualized plan of care and coordination with other care providers 2. 24/7 contact phone numbers for assistance for urgent and routine care needs. 3. Service will only be billed when office  clinical staff spend 20 minutes or more in a month to coordinate care. 4. Only one practitioner may furnish and bill the service in a calendar month. 5.The patient may stop CCM services at any time (effective at the end of the month) by phone call to the office staff. 6. The patient will be responsible for cost sharing (co-pay) of up to 20% of the service fee (after annual deductible is met). Patient agreed to services and consent obtained.  Patient Care Team: [name], MD as PCP - General (Family Medicine) Berniece Salines, DO as PCP - Cardiology (Cardiology) Marice Potter, MD as Consulting Physician (Oncology) Berniece Salines, DO as Consulting Physician (Cardiology) Brynda Peon, [place] as Physician Assistant (Orthopedic Surgery) Lynnell Dike, OD (Optometry) Lane Hacker, [facility] (Pharmacist) Recent office visits:  09/24/21 [name] MD. Seen for routine visit. No med changes.   08/06/21 [name] MD. Seen for fatigue. D/C Metoprolol Succinate 12.20m and Spironolactone 12.565m   Recent consult visits:  None   Hospital visits:  None   Objective:  Lab Results  Component Value Date   CREATININE 0.72 09/24/2021   BUN 12 09/24/2021   GFRNONAA 90 10/19/2019   GFRAA 104 10/19/2019   NA 142 09/24/2021   K 4.5 09/24/2021   CALCIUM 10.0 10/01/2021   CO2 21 09/24/2021   GLUCOSE 84 09/24/2021    Lab Results  Component Value Date/Time   HGBA1C 5.5 09/24/2021 09:06 AM   HGBA1C 5.7 (H) 12/25/2020 09:14 AM    Last diabetic Eye exam: No results found for: "HMDIABEYEEXA"  Last diabetic Foot exam: No results found for: "HMDIABFOOTEX"   Lab Results  Component Value Date   CHOL 146 09/24/2021   HDL 85 09/24/2021   LDLCALC 50 09/24/2021   TRIG 49 09/24/2021   CHOLHDL 1.7 09/24/2021       Latest Ref Rng & Units 09/24/2021    9:06 AM 08/06/2021   12:07 PM 12/25/2020    9:14 AM  Hepatic Function  Total Protein 6.0 - 8.5 g/dL 6.5  6.4  6.3   Albumin 3.8 - 4.8 g/dL 4.3   4.4  4.6   AST 0 - 40 IU/L 45  36  32   ALT 0 - 32 IU/L 38  40  32   Alk Phosphatase 44 - 121 IU/L 50  49  49   Total Bilirubin 0.0 - 1.2 mg/dL 0.9  0.7  0.8     Lab Results  Component Value Date/Time   TSH 1.890 08/06/2021 12:07 PM   TSH 2.490 12/25/2020 09:14 AM       Latest Ref Rng & Units 09/24/2021    9:06 AM 08/06/2021   12:07 PM 01/16/2021   12:00 AM  CBC  WBC 3.4 - 10.8 x10E3/uL 3.5  5.1  8.0   Hemoglobin 11.1 - 15.9 g/dL 12.1  12.0  11.9   Hematocrit 34.0 - 46.6 % 37.1  36.7  37.1   Platelets 150 - 450 x10E3/uL 244  263  306     Lab Results  Component Value Date/Time   VD25OH 56.7 09/24/2021 09:06 AM   VD25OH 62.8 08/06/2021 12:07 PM    Clinical ASCVD: No  The 10-year ASCVD risk score (Arnett DK, et al., 2019) is: 13.3%   Values used to calculate the score:     Age: 9 years     Sex: Female     Is Non-Hispanic African American: No     Diabetic: Yes     Tobacco smoker: No     Systolic Blood Pressure: 536 mmHg     Is BP treated: No     HDL Cholesterol: 85 mg/dL     Total Cholesterol: 146 mg/dL       10/30/2021    2:30 PM 08/06/2021   11:21 AM 12/25/2020    8:33 AM  Depression screen PHQ 2/9  Decreased Interest 0 1 0  Down, Depressed, Hopeless 0 1 0  PHQ - 2 Score 0 2 0  Altered sleeping  0 0  Tired, decreased energy  2 2  Change in appetite  0 0  Feeling bad or failure about yourself   1 0  Trouble concentrating  1 0  Moving slowly or fidgety/restless  0 1  Suicidal thoughts  0 0  PHQ-9 Score  6 3  Difficult doing work/chores  Somewhat difficult Somewhat difficult     Other: (CHADS2VASc if Afib, MMRC or CAT for COPD, ACT, DEXA)  Social History   Tobacco Use  Smoking Status Never  Smokeless Tobacco Never   BP Readings from Last 3 Encounters:  10/30/21 104/60  09/24/21 (!) 100/50  08/06/21 110/60   Pulse Readings from Last 3 Encounters:  10/30/21 78  09/24/21 78  08/06/21 72   Wt Readings from Last 3 Encounters:  10/30/21 134 lb 3.2 oz  (60.9 kg)  09/24/21 134 lb (60.8 kg)  08/06/21 133 lb (60.3 kg)   BMI Readings from Last 3 Encounters:  10/30/21 21.02 kg/m  09/24/21 20.99 kg/m  08/06/21 20.83 kg/m    Assessment/Interventions:  Review of patient past medical history, allergies, medications, health status, including review of consultants reports, laboratory and other test data, was performed as part of comprehensive evaluation and provision of chronic care management services.   SDOH:  (Social Determinants of Health) assessments and interventions performed: Yes SDOH Interventions    Flowsheet Row Chronic Care Management from 12/07/2021 in [place] Management from 04/13/2021 in [place] Office Visit from 12/25/2020 in [name] from 10/26/2020 in [place] Interventions      Transportation Interventions Intervention Not Indicated Intervention Not Indicated -- Intervention Not Indicated  Depression Interventions/Treatment  -- -- Medication --  Financial Strain Interventions Intervention Not Indicated Intervention Not Indicated -- --  Physical Activity Interventions -- -- -- Intervention Not Indicated      SDOH Screenings   Food Insecurity: No Food Insecurity (10/18/2020)  Housing: Low Risk  (10/18/2020)  Transportation Needs: No Transportation Needs (12/07/2021)  Alcohol Screen: Low Risk  (10/26/2020)  Depression (PHQ2-9): Low Risk  (10/30/2021)  Recent Concern: Depression (PHQ2-9) - Medium Risk (08/06/2021)  Financial Resource Strain: Medium Risk (12/07/2021)  Physical Activity: Sufficiently Active (10/26/2020)  Tobacco Use: Low Risk  (10/30/2021)    [place]  No Known Allergies  Medications Reviewed Today     Reviewed by Lane Hacker, [facility] (Pharmacist) on 12/07/21 at 1008  Med List Status: <None>   Medication Order Taking? Sig Documenting Provider Last Dose Status Informant  cetirizine (ZYRTEC) 10 MG tablet [id number] No Take 10  mg by mouth daily. [provider] Taking Active Self  ezetimibe (ZETIA) 10 MG tablet [id number] No Take 1 tablet (10 mg total) by mouth daily. Tobb, Kardie, DO Taking Active   FLUoxetine (PROZAC) 40 MG capsule [id number] No Take 1 capsule (40 mg total) by mouth daily. Cox, Kirsten, MD Taking Active   fluticasone [facility]) 50 MCG/ACT nasal spray [id number] No Use 2 spray(s) in each nostril once daily [name], MD Taking Active   Glucosamine-Chondroitin-MSM 1500-1200-500 MG PACK [id number] No Take 1 tablet by mouth daily. [provider] Taking Active Self  ibuprofen (ADVIL) 200 MG tablet [id number] No Take 200 mg by mouth every 8 (eight) hours as needed for moderate pain. [provider] Taking Active Self  LORazepam (ATIVAN) 0.5 MG tablet [id number] No Take 1 tablet (0.5 mg total) by mouth at bedtime as needed for anxiety. [name], MD Taking Active   Melatonin 10 MG CAPS [id number] No Take 5 mg by mouth at bedtime. [provider] Taking Active Self  Multiple Vitamin (MULTIVITAMIN) capsule [id number] No Take 1 capsule by mouth daily. [provider] Taking Active Self  Omega-3 Fatty Acids (FISH OIL) 1000 MG CAPS [id number] No Take 1,000 mg by mouth 2 (two) times daily. [provider] Taking Active Self  omeprazole (PRILOSEC) 20 MG capsule [id number] No Take 1 capsule (20 mg total) by mouth daily. Cox, Kirsten, MD Taking Active   rosuvastatin (CRESTOR) 40 MG tablet [id number] No Take 1 tablet (40 mg total) by mouth daily. Tobb, Kardie, DO Taking Active   sacubitril-valsartan (ENTRESTO) 24-26 MG [id number] No Take 1 tablet by mouth 2 (two) times daily. Tobb, Godfrey Pick, DO Taking Active   Vitamin D, Cholecalciferol, 25 MCG (1000 UT) CAPS [id number] No Take 1,000 Units by mouth daily. [provider] Taking Active Self            Patient Active Problem List   Diagnosis Date Noted   Other fatigue  08/10/2021   Paresthesia 08/10/2021   ICD  (implantable cardioverter-defibrillator), biventricular, in situ 06/06/2021   Prediabetes 16/55/3748   Chronic systolic congestive heart failure ([place]) 12/25/2020   Depression, major, recurrent, mild ([place]) 12/25/2020   Ganglion cyst of right foot 12/25/2020   Nonischemic cardiomyopathy ([place]) 08/17/2020   LBBB (left bundle branch block) 08/17/2020   Closed fracture of distal end of left radius 08/16/2020   Dilated cardiomyopathy ([place]) 08/08/2020   Hyperlipidemia    Mitral valve prolapse    Gastroesophageal reflux disease 10/19/2019   Hyperlipidemia associated with type 2 diabetes mellitus ([place]) 10/19/2019   Osteopenia 10/19/2019    Immunization History  Administered Date(s) Administered   Fluad Quad(high Dose 65+) 10/07/2018, 10/20/2019, 10/19/2020, 10/30/2021   Influenza, High Dose Seasonal PF 12/02/2017   Influenza, Seasonal, Injecte, Preservative Fre 11/22/2013   Influenza,inj,Quad PF,6-35 Mos 11/26/2012   Influenza-Unspecified 11/27/2009, 11/22/2010, 10/28/2011, 11/21/2014, 12/14/2015, 10/17/2016   Moderna Sars-Covid-2 Vaccination 04/16/2019, 05/17/2019, 01/17/2020   Pfizer Covid-19 Vaccine Bivalent Booster 10yr & up 12/25/2020   Pneumococcal Conjugate-13 12/22/2014   Pneumococcal Polysaccharide-23 03/22/2010, 10/19/2020   Tdap 11/30/2012    Conditions to be addressed/monitored:  Hyperlipidemia, Diabetes, GERD, and Depression  There are no care plans that you recently modified to display for this patient.     Medication Assistance: None required.  Patient affirms current coverage meets needs.      Patient's preferred pharmacy is:  [facility] [place] [address] [id number]: [phone]: [phone]  Uses pill box? Yes Pt endorses good compliance  We discussed: Benefits of medication synchronization, packaging and delivery as well as enhanced pharmacist oversight with  [facility]. Patient decided to: Utilize [facility] for medication synchronization, packaging and delivery -Verbal consent obtained for [facility] enhanced pharmacy services (medication synchronization, adherence packaging, delivery coordination). A medication sync plan was created to allow patient to get all medications delivered once every 30 to 90 days per patient preference. Patient understands they have freedom to choose pharmacy and clinical pharmacist will coordinate care between all prescribers and [facility].   Care Plan and Follow Up Patient Decision:  Patient agrees to Care Plan and Follow-up.  Plan: Telephone follow up appointment with care management team member scheduled for:  prn  NArizona Constable Pharm.D. -- [phone]

## 2021-12-18 DIAGNOSIS — E782 Mixed hyperlipidemia: Secondary | ICD-10-CM

## 2021-12-18 DIAGNOSIS — I5022 Chronic systolic (congestive) heart failure: Secondary | ICD-10-CM

## 2021-12-25 ENCOUNTER — Other Ambulatory Visit: Payer: Self-pay | Admitting: Family Medicine

## 2021-12-25 ENCOUNTER — Telehealth: Payer: Self-pay | Admitting: Family Medicine

## 2021-12-25 DIAGNOSIS — Z13820 Encounter for screening for osteoporosis: Secondary | ICD-10-CM

## 2021-12-25 NOTE — Progress Notes (Signed)
Chronic Care Management Pharmacy Assistant   Name: Felicidad Sugarman  MRN: [id number] DOB: [birth year]   Reason for Encounter: Medication Coordination for [facility]   Recent office visits:  None  Recent consult visits:  None  Hospital visits:  None  Medications: Outpatient Encounter Medications as of 12/25/2021  Medication Sig   cetirizine (ZYRTEC) 10 MG tablet Take 10 mg by mouth daily.   ezetimibe (ZETIA) 10 MG tablet Take 1 tablet (10 mg total) by mouth daily.   FLUoxetine (PROZAC) 40 MG capsule Take 1 capsule (40 mg total) by mouth daily.   fluticasone (FLONASE) 50 MCG/ACT nasal spray Use 2 spray(s) in each nostril once daily   Glucosamine-Chondroitin-MSM 1500-1200-500 MG PACK Take 1 tablet by mouth daily.   ibuprofen (ADVIL) 200 MG tablet Take 200 mg by mouth every 8 (eight) hours as needed for moderate pain.   LORazepam (ATIVAN) 0.5 MG tablet Take 1 tablet (0.5 mg total) by mouth at bedtime as needed for anxiety.   Melatonin 10 MG CAPS Take 5 mg by mouth at bedtime.   Multiple Vitamin (MULTIVITAMIN) capsule Take 1 capsule by mouth daily.   Omega-3 Fatty Acids (FISH OIL) 1000 MG CAPS Take 1,000 mg by mouth 2 (two) times daily.   omeprazole (PRILOSEC) 20 MG capsule Take 1 capsule (20 mg total) by mouth daily.   rosuvastatin (CRESTOR) 40 MG tablet Take 1 tablet (40 mg total) by mouth daily.   sacubitril-valsartan (ENTRESTO) 24-26 MG Take 1 tablet by mouth 2 (two) times daily.   Vitamin D, Cholecalciferol, 25 MCG (1000 UT) CAPS Take 1,000 Units by mouth daily.   No facility-administered encounter medications on file as of 12/25/2021.    Reviewed chart for medication changes ahead of medication coordination call.  No OVs, Consults, or hospital visits since last care coordination call/Pharmacist visit.   No medication changes indicated OR if recent visit, treatment plan here.  BP Readings from Last 3 Encounters:  10/30/21 104/60  09/24/21 (!) 100/50  08/06/21  110/60    Lab Results  Component Value Date   HGBA1C 5.5 09/24/2021     Patient obtains medications through Adherence Packaging  90 Days   Last adherence delivery included:  Flonase Nasal Spray 81mg- 2 sprays each nostril daily       Cetirizine '10mg'$  1 at B Ezetimibe '10mg'$  1 at BT Omeprazole '20mg'$ - 1 B Rosuvastatin '40mg'$  1 at BT Entresto 24-'26mg'$  1 at B 1 at BT Vitamin D3 267m 1 at B Fish Oil '1000mg'$  1 at B and 1 at BT Fluoxetine '40mg'$ - 1 at B  Patient declined (meds) last delivery Melatonin '10mg'$ - Wants to continue getting at [facility] Lorazepam 0.'5mg'$ - Pt only uses PRN and still has plenty on hand Glucosamine-Chondroitin 1500-1200  Patient is due for next adherence delivery on: 01/04/22. Called patient and reviewed medications and coordinated delivery.  This delivery to include: Flonase Nasal Spray 5062m 2 sprays each nostril daily       Cetirizine '10mg'$  1 at B Ezetimibe '10mg'$  1 at BT Omeprazole '20mg'$ - 1 B Rosuvastatin '40mg'$  1 at BT Entresto 24-'26mg'$  1 B 1 BT Vitamin D3 64m98m at B Fish Oil '1000mg'$  1 at B and 1 at BT Fluoxetine '40mg'$ - 1 at B Glucosamine-Chondroitin 1500-1200- 1 B Melatonin '10mg'$ - 1 at bedtime (Vials)  Patient declined the following medications  Lorazepam 0.'5mg'$ - Pt only uses PRN and still has plenty on hand  Patient needs refills  None  Confirmed delivery date of 01/04/22, advised patient that  pharmacy will contact them the morning of delivery.   [name], [place] Pharmacist Assistant  [phone]

## 2021-12-29 ENCOUNTER — Other Ambulatory Visit: Payer: Self-pay | Admitting: Family Medicine

## 2021-12-29 DIAGNOSIS — R7303 Prediabetes: Secondary | ICD-10-CM

## 2021-12-29 DIAGNOSIS — E1169 Type 2 diabetes mellitus with other specified complication: Secondary | ICD-10-CM

## 2021-12-29 DIAGNOSIS — I5022 Chronic systolic (congestive) heart failure: Secondary | ICD-10-CM

## 2021-12-29 DIAGNOSIS — K219 Gastro-esophageal reflux disease without esophagitis: Secondary | ICD-10-CM

## 2022-01-14 ENCOUNTER — Encounter: Payer: Self-pay | Admitting: Family Medicine

## 2022-01-17 ENCOUNTER — Telehealth: Payer: Self-pay

## 2022-01-17 NOTE — Progress Notes (Signed)
  Chronic Care Management   Note  01/17/2022 Name: Audrey Ruiz MRN: [id number] DOB: 08-21-1950  Audrey Ruiz is a 71 y.o. year old female who is a primary care patient of Cox, Kirsten, MD. I reached out to [name] by phone today in response to a referral sent by Ms. Audrey Ruiz's PCP.  Audrey Ruiz was not successfully contacted today. A HIPAA compliant voice message was left requesting a return call.   Follow up plan: Additional outreach attempts will be made.  Noreene Larsson, [place], [place] [address] Direct Dial: [phone] [email or web address]

## 2022-01-22 ENCOUNTER — Telehealth: Payer: Self-pay

## 2022-01-22 NOTE — Telephone Encounter (Signed)
   CCM RN Visit Note   01-22-2022 Name: Audrey Ruiz MRN: [id number]      DOB: February 23, 1950  Subjective: Audrey Ruiz is a 71 y.o. year old female who is a primary care patient of Dr. [name] The patient was referred to the Chronic Care Management team for assistance with care management needs subsequent to provider initiation of CCM services and plan of care.      An unsuccessful telephone outreach was attempted today to contact the patient about Chronic Care Management needs.  The patient answered but she states she did not have time to talk today as she was getting groceries put up for her elderly parents. She says that around 10 am is good time to reach her. Gave permission to put her on the schedule to call around 10 am for follow up.   Plan:The care management team will reach out to the patient again over the next 30 days.  Audrey Larsson RN, MSN, CCM RN Care Manager  Chronic Care Management Direct Number: [phone]

## 2022-01-23 ENCOUNTER — Telehealth: Payer: HMO

## 2022-01-23 ENCOUNTER — Ambulatory Visit (INDEPENDENT_AMBULATORY_CARE_PROVIDER_SITE_OTHER): Payer: HMO

## 2022-01-23 DIAGNOSIS — E1169 Type 2 diabetes mellitus with other specified complication: Secondary | ICD-10-CM

## 2022-01-23 DIAGNOSIS — I5022 Chronic systolic (congestive) heart failure: Secondary | ICD-10-CM

## 2022-01-23 NOTE — Plan of Care (Signed)
Chronic Care Management Provider Comprehensive Care Plan    01/23/2022 Name: Audrey Ruiz MRN: [id number] DOB: [birth year]  Referral to Chronic Care Management (CCM) services was placed by Provider:  Dr. [name] on Date: 12-29-2021.  Chronic Condition 1: Heart Failure Provider Assessment and Plan  Management per specialist. The current medical regimen is effective;  continue present plan and medications. Taking Entresto 24-26 mg daily.          Relevant Orders    CBC with Differential/Platelet (Completed)         Expected Outcome/Goals Addressed This Visit (Provider CCM goals/Provider Assessment and plan   CCM (HEART FAILURE)  EXPECTED OUTCOME:  MONITOR, SELF-MANAGE AND REDUCE SYMPTOMS OF HEART FAILURE  Symptom Management Condition 1: Take all medications as prescribed Attend all scheduled provider appointments Call provider office for new concerns or questions  call the Suicide and Crisis Lifeline: 988 call the [place] National Suicide Prevention Lifeline: 226 450 6811 or TTY: (438)264-8991 TTY (570)132-5873) to talk to a trained counselor call 1-800-273-TALK (toll free, 24 hour hotline) if experiencing a Mental Health or [place]  call office if I gain more than 2 pounds in one day or 5 pounds in one week use salt in moderation watch for swelling in feet, ankles and legs every day weigh myself daily develop a rescue plan follow rescue plan if symptoms flare-up track symptoms and what helps feel better or worse dress right for the weather, hot or cold  Chronic Condition 2: HLD Provider Assessment and Plan   Well controlled.  No changes to medicines.  Continue to work on eating a healthy diet and exercise.  Labs drawn today.          Relevant Orders    Lipid panel (Completed)     Expected Outcome/Goals Addressed This Visit (Provider CCM goals/Provider Assessment and plan   CCM (HLD)  EXPECTED OUTCOME:  MONITOR, SELF-MANAGE AND REDUCE  SYMPTOMS OF Hyperlipidema  Symptom Management Condition 2: Take all medications as prescribed Attend all scheduled provider appointments Call provider office for new concerns or questions  call the Suicide and Crisis Lifeline: 988 call the [place] National Suicide Prevention Lifeline: 270-558-8239 or TTY: (709) 107-5956 TTY 825 642 7924) to talk to a trained counselor call 1-800-273-TALK (toll free, 24 hour hotline) if experiencing a Mental Health or [place]  call for medicine refill 2 or 3 days before it runs out take all medications exactly as prescribed call doctor with any symptoms you believe are related to your medicine call doctor when you experience any new symptoms go to all doctor appointments as scheduled adhere to prescribed diet: heart healthy  Problem List Patient Active Problem List   Diagnosis Date Noted   Other fatigue 08/10/2021   Paresthesia 08/10/2021   ICD (implantable cardioverter-defibrillator), biventricular, in situ 06/06/2021   Prediabetes 57/32/2025   Chronic systolic congestive heart failure ([place]) 12/25/2020   Depression, major, recurrent, mild ([place]) 12/25/2020   Ganglion cyst of right foot 12/25/2020   Nonischemic cardiomyopathy ([place]) 08/17/2020   LBBB (left bundle branch block) 08/17/2020   Closed fracture of distal end of left radius 08/16/2020   Dilated cardiomyopathy ([place]) 08/08/2020   Hyperlipidemia    Mitral valve prolapse    Gastroesophageal reflux disease 10/19/2019   Hyperlipidemia associated with type 2 diabetes mellitus ([place]) 10/19/2019   Osteopenia 10/19/2019    Medication Management  Current Outpatient Medications:    cetirizine (ZYRTEC) 10 MG tablet, Take 10 mg by mouth daily., Disp: , Rfl:  ezetimibe (ZETIA) 10 MG tablet, Take 1 tablet (10 mg total) by mouth daily., Disp: 90 tablet, Rfl: 3   FLUoxetine (PROZAC) 40 MG capsule, Take 1 capsule (40 mg total) by mouth daily., Disp: 90 capsule, Rfl: 1   fluticasone  (FLONASE) 50 MCG/ACT nasal spray, Use 2 spray(s) in each nostril once daily, Disp: 48 g, Rfl: 3   Glucosamine-Chondroitin-MSM 1500-1200-500 MG PACK, Take 1 tablet by mouth daily., Disp: , Rfl:    ibuprofen (ADVIL) 200 MG tablet, Take 200 mg by mouth every 8 (eight) hours as needed for moderate pain., Disp: , Rfl:    LORazepam (ATIVAN) 0.5 MG tablet, Take 1 tablet (0.5 mg total) by mouth at bedtime as needed for anxiety., Disp: 30 tablet, Rfl: 2   Melatonin 10 MG CAPS, Take 5 mg by mouth at bedtime., Disp: , Rfl:    Multiple Vitamin (MULTIVITAMIN) capsule, Take 1 capsule by mouth daily., Disp: , Rfl:    Omega-3 Fatty Acids (FISH OIL) 1000 MG CAPS, Take 1,000 mg by mouth 2 (two) times daily., Disp: , Rfl:    omeprazole (PRILOSEC) 20 MG capsule, Take 1 capsule (20 mg total) by mouth daily., Disp: 90 capsule, Rfl: 3   rosuvastatin (CRESTOR) 40 MG tablet, Take 1 tablet (40 mg total) by mouth daily., Disp: 90 tablet, Rfl: 3   sacubitril-valsartan (ENTRESTO) 24-26 MG, Take 1 tablet by mouth 2 (two) times daily., Disp: 180 tablet, Rfl: 3   Vitamin D, Cholecalciferol, 25 MCG (1000 UT) CAPS, Take 1,000 Units by mouth daily., Disp: , Rfl:   Cognitive Assessment Identity Confirmed: : Name; DOB Cognitive Status: Normal   Functional Assessment Hearing Difficulty or Deaf: no Wear Glasses or Blind: yes Vision Management: wears reading glasses, had catarat sx a few years ago Concentrating, Remembering or Making Decisions Difficulty (CP): no Difficulty Communicating: no Difficulty Eating/Swallowing: no Walking or Climbing Stairs Difficulty: no Dressing/Bathing Difficulty: no Doing Errands Independently Difficulty (such as shopping) (CP): no Change in Functional Status Since Onset of Current Illness/Injury: no   Caregiver Assessment  Primary Source of Support/Comfort: spouse; child(ren) Name of Support/Comfort Primary Source: Richardson Landry- husband People in Home: spouse Name(s) of People in Home:  [place] if Needed: none Primary Roles/Responsibilities: caregiver for other(s) (takes care of her elderly parents)   Planned Interventions  Basic overview and discussion of pathophysiology of Heart Failure reviewed Provided education on low sodium diet. States her appetite is good, denies any issues with heart healthy diet, monitors sugar content as well Reviewed Heart Failure Action Plan in depth and provided written copy Assessed need for readable accurate scales in home Provided education about placing scale on hard, flat surface Advised patient to weigh each morning after emptying bladder Discussed importance of daily weight and advised patient to weigh and record daily Reviewed role of diuretics in prevention of fluid overload and management of heart failure Discussed the importance of keeping all appointments with provider Provided patient with education about the role of exercise in the management of heart failure Advised patient to discuss changes in HF, questions or concerns about heart health with provider Screening for signs and symptoms of depression related to chronic disease state  Assessed social determinant of health barriers EF 50-55% 06-06-2021 Pacemaker placement in early 2023 Provider established cholesterol goals reviewed; Counseled on importance of regular laboratory monitoring as prescribed; Provided HLD educational materials; Reviewed role and benefits of statin for ASCVD risk reduction; Discussed strategies to manage statin-induced myalgias; Reviewed importance of limiting foods high in  cholesterol; Reviewed exercise goals and target of 150 minutes per week; Screening for signs and symptoms of depression related to chronic disease state;  Assessed social determinant of health barriers;  Discussed resources available to help with needs as they arise. Discussed self care and making sure she carves out time for self care and her own needs. The patient  assist with the care of her elderly parents, mother in law, and husband. Also helps with the care of elderly aunt and recently moving an aunt to the [place] area. Discussed making sure she is monitoring her stress level and health and well being.   Interaction and coordination with outside resources, practitioners, and providers See CCM Referral  Care Plan: Available in MyChart

## 2022-01-23 NOTE — Patient Instructions (Signed)
Please call the care guide team at [phone] if you need to cancel or reschedule your appointment.   If you are experiencing a Mental Health or [place] or need someone to talk to, please call the Suicide and Crisis Lifeline: 988 call the [place] National Suicide Prevention Lifeline: 820-509-7692 or TTY: 737-103-2674 TTY 450-168-9317) to talk to a trained counselor call 1-800-273-TALK (toll free, 24 hour hotline)   Following is a copy of your full provider care plan:   Goals Addressed             This Visit's Progress    CCM Expected Outcome:  Monitor, Self-Manage and Reduce Symptoms of Heart Failure       Current Barriers:  Chronic Disease Management support and education needs related to effective management of HF  Planned Interventions: Basic overview and discussion of pathophysiology of Heart Failure reviewed Provided education on low sodium diet. States her appetite is good, denies any issues with heart healthy diet, monitors sugar content as well Reviewed Heart Failure Action Plan in depth and provided written copy Assessed need for readable accurate scales in home Provided education about placing scale on hard, flat surface Advised patient to weigh each morning after emptying bladder Discussed importance of daily weight and advised patient to weigh and record daily Reviewed role of diuretics in prevention of fluid overload and management of heart failure Discussed the importance of keeping all appointments with provider Provided patient with education about the role of exercise in the management of heart failure Advised patient to discuss changes in HF, questions or concerns about heart health with provider Screening for signs and symptoms of depression related to chronic disease state  Assessed social determinant of health barriers EF 50-55% 06-06-2021 Pacemaker placement in early 2023  Symptom Management: Take medications as prescribed   Attend all  scheduled provider appointments Call provider office for new concerns or questions  call the Suicide and Crisis Lifeline: 988 call the [place] National Suicide Prevention Lifeline: (639)277-9602 or TTY: 769-428-9300 TTY 818-703-1963) to talk to a trained counselor call 1-800-273-TALK (toll free, 24 hour hotline) if experiencing a Mental Health or [place]  call office if I gain more than 2 pounds in one day or 5 pounds in one week use salt in moderation watch for swelling in feet, ankles and legs every day weigh myself daily develop a rescue plan follow rescue plan if symptoms flare-up track symptoms and what helps feel better or worse dress right for the weather, hot or cold  Follow Up Plan: Telephone follow up appointment with care management team member scheduled for: 04-10-2022 at 0945 am       CCM Expected Outcome:  Monitor, Self-Manage and Reduce Symptoms of:HLD       Current Barriers:  Chronic Disease Management support and education needs related to HLD Caregiver stress: The patient assist and cares for her elderly parents and several others  Planned Interventions: Provider established cholesterol goals reviewed; Counseled on importance of regular laboratory monitoring as prescribed; Provided HLD educational materials; Reviewed role and benefits of statin for ASCVD risk reduction; Discussed strategies to manage statin-induced myalgias; Reviewed importance of limiting foods high in cholesterol; Reviewed exercise goals and target of 150 minutes per week; Screening for signs and symptoms of depression related to chronic disease state;  Assessed social determinant of health barriers;  Discussed resources available to help with needs as they arise. Discussed self care and making sure she carves out time for self care and  her own needs. The patient assist with the care of her elderly parents, mother in law, and husband. Also helps with the care of elderly aunt and  recently moving an aunt to the [place] area. Discussed making sure she is monitoring her stress level and health and well being.  Symptom Management: Take medications as prescribed   Attend all scheduled provider appointments Call provider office for new concerns or questions  call the Suicide and Crisis Lifeline: 988 call the [place] National Suicide Prevention Lifeline: 380-153-0356 or TTY: 727-661-5977 TTY 7165469669) to talk to a trained counselor call 1-800-273-TALK (toll free, 24 hour hotline) if experiencing a Mental Health or [place]  - call for medicine refill 2 or 3 days before it runs out - take all medications exactly as prescribed - call doctor with any symptoms you believe are related to your medicine - call doctor when you experience any new symptoms - go to all doctor appointments as scheduled - adhere to prescribed diet: heart healthy diet  Follow Up Plan: Telephone follow up appointment with care management team member scheduled for: 04-10-2022 at 0945 am          Patient verbalizes understanding of instructions and care plan provided today and agrees to view in [place]. Active MyChart status and patient understanding of how to access instructions and care plan via MyChart confirmed with patient.     Telephone follow up appointment with care management team member scheduled for: 04-10-2022 at 0945 am

## 2022-01-23 NOTE — Chronic Care Management (AMB) (Signed)
Chronic Care Management   CCM RN Visit Note  01/23/2022 Name: Audrey Ruiz MRN: [id number] DOB: August 12, 1950  Subjective: Audrey Ruiz is a 71 y.o. year old female who is a primary care patient of Cox, Kirsten, MD. The patient was referred to the Chronic Care Management team for assistance with care management needs subsequent to provider initiation of CCM services and plan of care.    Today's Visit:  Engaged with patient by telephone for initial visit.     SDOH Interventions Today    Flowsheet Row Most Recent Value  SDOH Interventions   Food Insecurity Interventions Intervention Not Indicated  Housing Interventions Intervention Not Indicated  Transportation Interventions Intervention Not Indicated  Utilities Interventions Intervention Not Indicated  Alcohol Usage Interventions Intervention Not Indicated (Score <7)  Financial Strain Interventions Intervention Not Indicated, Other (Comment)  [works with pharm D for medications assistance]  Physical Activity Interventions Intervention Not Indicated  Stress Interventions Other (Comment)  [has a lot on her plate, caregvier of others, talked about self care, states she is a calm person]         Goals Addressed             This Visit's Progress    CCM Expected Outcome:  Monitor, Self-Manage and Reduce Symptoms of Heart Failure       Current Barriers:  Chronic Disease Management support and education needs related to effective management of HF  Planned Interventions: Basic overview and discussion of pathophysiology of Heart Failure reviewed Provided education on low sodium diet. States her appetite is good, denies any issues with heart healthy diet, monitors sugar content as well Reviewed Heart Failure Action Plan in depth and provided written copy Assessed need for readable accurate scales in home Provided education about placing scale on hard, flat surface Advised patient to weigh each morning after  emptying bladder Discussed importance of daily weight and advised patient to weigh and record daily Reviewed role of diuretics in prevention of fluid overload and management of heart failure Discussed the importance of keeping all appointments with provider Provided patient with education about the role of exercise in the management of heart failure Advised patient to discuss changes in HF, questions or concerns about heart health with provider Screening for signs and symptoms of depression related to chronic disease state  Assessed social determinant of health barriers EF 50-55% 06-06-2021 Pacemaker placement in early 2023  Symptom Management: Take medications as prescribed   Attend all scheduled provider appointments Call provider office for new concerns or questions  call the Suicide and Crisis Lifeline: 988 call the [place] National Suicide Prevention Lifeline: (916)448-1414 or TTY: 551-688-9675 TTY (504)245-7814) to talk to a trained counselor call 1-800-273-TALK (toll free, 24 hour hotline) if experiencing a Mental Health or [place]  call office if I gain more than 2 pounds in one day or 5 pounds in one week use salt in moderation watch for swelling in feet, ankles and legs every day weigh myself daily develop a rescue plan follow rescue plan if symptoms flare-up track symptoms and what helps feel better or worse dress right for the weather, hot or cold  Follow Up Plan: Telephone follow up appointment with care management team member scheduled for: 04-10-2022 at 0945 am       CCM Expected Outcome:  Monitor, Self-Manage and Reduce Symptoms of:HLD       Current Barriers:  Chronic Disease Management support and education needs related to HLD Caregiver stress: The  patient assist and cares for her elderly parents and several others  Planned Interventions: Provider established cholesterol goals reviewed; Counseled on importance of regular laboratory monitoring as  prescribed; Provided HLD educational materials; Reviewed role and benefits of statin for ASCVD risk reduction; Discussed strategies to manage statin-induced myalgias; Reviewed importance of limiting foods high in cholesterol; Reviewed exercise goals and target of 150 minutes per week; Screening for signs and symptoms of depression related to chronic disease state;  Assessed social determinant of health barriers;  Discussed resources available to help with needs as they arise. Discussed self care and making sure she carves out time for self care and her own needs. The patient assist with the care of her elderly parents, mother in law, and husband. Also helps with the care of elderly aunt and recently moving an aunt to the [place] area. Discussed making sure she is monitoring her stress level and health and well being.  Symptom Management: Take medications as prescribed   Attend all scheduled provider appointments Call provider office for new concerns or questions  call the Suicide and Crisis Lifeline: 988 call the [place] National Suicide Prevention Lifeline: (838)363-8561 or TTY: (703)491-8306 TTY 212-092-5698) to talk to a trained counselor call 1-800-273-TALK (toll free, 24 hour hotline) if experiencing a Mental Health or [place]  - call for medicine refill 2 or 3 days before it runs out - take all medications exactly as prescribed - call doctor with any symptoms you believe are related to your medicine - call doctor when you experience any new symptoms - go to all doctor appointments as scheduled - adhere to prescribed diet: heart healthy diet  Follow Up Plan: Telephone follow up appointment with care management team member scheduled for: 04-10-2022 at 0945 am          Plan:Telephone follow up appointment with care management team member scheduled for:  04-10-2022 at [place] am  Noreene Larsson RN, MSN, CCM RN Care Manager  Chronic Care Management Direct Number:  [phone]

## 2022-02-05 ENCOUNTER — Ambulatory Visit (INDEPENDENT_AMBULATORY_CARE_PROVIDER_SITE_OTHER): Payer: HMO

## 2022-02-05 DIAGNOSIS — I428 Other cardiomyopathies: Secondary | ICD-10-CM

## 2022-02-05 LAB — CUP PACEART REMOTE DEVICE CHECK
Battery Remaining Longevity: 80 mo
Battery Remaining Percentage: 84 %
Battery Voltage: 2.98 V
Brady Statistic AP VP Percent: 1 %
Brady Statistic AP VS Percent: 1 %
Brady Statistic AS VP Percent: 95 %
Brady Statistic AS VS Percent: 2.4 %
Brady Statistic RA Percent Paced: 1 %
Date Time Interrogation Session: 20231219010058
HighPow Impedance: 71 Ohm
Implantable Lead Connection Status: 753985
Implantable Lead Connection Status: 753985
Implantable Lead Connection Status: 753985
Implantable Lead Implant Date: 20221219
Implantable Lead Implant Date: 20221219
Implantable Lead Implant Date: 20221219
Implantable Lead Location: 753858
Implantable Lead Location: 753859
Implantable Lead Location: 753860
Implantable Pulse Generator Implant Date: 20221219
Lead Channel Impedance Value: 340 Ohm
Lead Channel Impedance Value: 440 Ohm
Lead Channel Impedance Value: 680 Ohm
Lead Channel Pacing Threshold Amplitude: 0.625 V
Lead Channel Pacing Threshold Amplitude: 0.75 V
Lead Channel Pacing Threshold Amplitude: 1.5 V
Lead Channel Pacing Threshold Pulse Width: 0.5 ms
Lead Channel Pacing Threshold Pulse Width: 0.5 ms
Lead Channel Pacing Threshold Pulse Width: 0.5 ms
Lead Channel Sensing Intrinsic Amplitude: 12 mV
Lead Channel Sensing Intrinsic Amplitude: 2.6 mV
Lead Channel Setting Pacing Amplitude: 1.625
Lead Channel Setting Pacing Amplitude: 2 V
Lead Channel Setting Pacing Amplitude: 2 V
Lead Channel Setting Pacing Pulse Width: 0.5 ms
Lead Channel Setting Pacing Pulse Width: 0.5 ms
Lead Channel Setting Sensing Sensitivity: 0.5 mV
Pulse Gen Serial Number: 111053351
Zone Setting Status: 755011

## 2022-02-17 DIAGNOSIS — I5020 Unspecified systolic (congestive) heart failure: Secondary | ICD-10-CM

## 2022-02-17 DIAGNOSIS — E785 Hyperlipidemia, unspecified: Secondary | ICD-10-CM

## 2022-02-19 DIAGNOSIS — R051 Acute cough: Secondary | ICD-10-CM | POA: Diagnosis not present

## 2022-02-19 DIAGNOSIS — J069 Acute upper respiratory infection, unspecified: Secondary | ICD-10-CM | POA: Diagnosis not present

## 2022-02-19 DIAGNOSIS — R0981 Nasal congestion: Secondary | ICD-10-CM | POA: Diagnosis not present

## 2022-02-25 ENCOUNTER — Ambulatory Visit (INDEPENDENT_AMBULATORY_CARE_PROVIDER_SITE_OTHER): Payer: PPO | Admitting: Physician Assistant

## 2022-02-25 ENCOUNTER — Encounter: Payer: Self-pay | Admitting: Physician Assistant

## 2022-02-25 VITALS — BP 110/60 | HR 71 | Temp 97.3°F | Ht 67.0 in | Wt 135.8 lb

## 2022-02-25 DIAGNOSIS — J069 Acute upper respiratory infection, unspecified: Secondary | ICD-10-CM | POA: Diagnosis not present

## 2022-02-25 DIAGNOSIS — J40 Bronchitis, not specified as acute or chronic: Secondary | ICD-10-CM

## 2022-02-25 LAB — POC COVID19 BINAXNOW: SARS Coronavirus 2 Ag: NEGATIVE

## 2022-02-25 LAB — POCT RESPIRATORY SYNCYTIAL VIRUS: RSV Rapid Ag: NEGATIVE

## 2022-02-25 MED ORDER — PREDNISONE 20 MG PO TABS: ORAL_TABLET | ORAL | 0 refills | Status: AC

## 2022-02-25 MED ORDER — AZITHROMYCIN 250 MG PO TABS: ORAL_TABLET | ORAL | 0 refills | Status: AC

## 2022-02-25 MED ORDER — HYDROCODONE BIT-HOMATROP MBR 5-1.5 MG/5ML PO SOLN: 5.0000 mL | Freq: Four times a day (QID) | ORAL | 0 refills | Status: DC | PRN

## 2022-02-25 NOTE — Progress Notes (Signed)
Acute Office Visit  Subjective:    Patient ID: Audrey Ruiz, female    DOB: 11/25/1950, 72 y.o.   MRN: [id number]  Chief Complaint  Patient presents with   Cough   Nasal Congestion    HPI: Patient is in today for complaints of chest congestion, cough that has been productive and intermittent fever over the past week - she was seen at Urgent care earlier last week and tested negative for flu and covid - has been taking otc nyquil and tessalon perles with minimal relief  Past Medical History:  Diagnosis Date   Abnormal nuclear stress test 08/08/2020   Abnormal nuclear stress test 08/08/2020   Anxiety    Closed fracture of distal end of left radius 08/16/2020   Depressed left ventricular ejection fraction    Depression    Diabetes mellitus without complication ([place])    Dilated cardiomyopathy ([place]) 08/08/2020   Encounter for Medicare annual wellness exam 10/19/2019   Gastroesophageal reflux disease 10/19/2019   GERD (gastroesophageal reflux disease)    Hydropneumothorax 02/08/2021   Hyperlipidemia    Hyperlipidemia associated with type 2 diabetes mellitus ([place]) 10/19/2019   LBBB (left bundle branch block) 08/17/2020   Mitral valve prolapse    Nonischemic cardiomyopathy ([place]) 08/17/2020   Osteopenia 10/19/2019   Sinusitis 08/16/2019   Upper respiratory tract infection due to COVID-19 virus 03/18/2021    Past Surgical History:  Procedure Laterality Date   BIV ICD INSERTION CRT-D N/A 02/05/2021   Procedure: BIV ICD INSERTION CRT-D;  Surgeon: [name], MD;  Location: [facility];  Service: Cardiovascular;  Laterality: N/A;   BREAST LUMPECTOMY Left 2010   CATARACT EXTRACTION BILATERAL W/ ANTERIOR VITRECTOMY  2021   LEFT HEART CATH AND CORONARY ANGIOGRAPHY N/A 08/08/2020   Procedure: LEFT HEART CATH AND CORONARY ANGIOGRAPHY;  Surgeon: Leonie Man, MD;  Location: [facility];  Service: Cardiovascular;  Laterality: N/A;    Family  History  Problem Relation Age of Onset   Osteoporosis Mother    Heart disease Father    Hyperlipidemia Father    Coronary artery disease Father    Diabetes Sister    Breast cancer Daughter    Diabetes Maternal Grandfather     Social History   Socioeconomic History   Marital status: Married    Spouse name: [name]   Number of children: 2   Years of education: Not on file   Highest education level: Master's degree (e.g., MA, MS, MEng, MEd, MSW, MBA)  Occupational History   Occupation: Retired  Tobacco Use   Smoking status: Never   Smokeless tobacco: Never  Vaping Use   Vaping Use: Never used  Substance and Sexual Activity   Alcohol use: Yes    Alcohol/week: 2.0 standard drinks of alcohol    Types: 2 Glasses of wine per week    Comment: occasional (white wine)   Drug use: Never   Sexual activity: Not Currently  Other Topics Concern   Not on file  Social History Narrative   Son lives in [place], Daughter lives in [place]   Patient 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 mother and father as well as two aunts   Social Determinants of Health   Financial Resource Strain: Medium Risk (01/23/2022)   Overall Financial Resource Strain (CARDIA)    Difficulty of Paying Living Expenses: Somewhat hard  Food Insecurity: No Food Insecurity (01/23/2022)   Hunger Vital Sign    Worried About Running Out of Food  in the Last Year: Never true    [place] in the Last Year: Never true  Transportation Needs: No Transportation Needs (01/23/2022)   PRAPARE - Hydrologist (Medical): No    Lack of Transportation (Non-Medical): No  Physical Activity: Sufficiently Active (01/23/2022)   Exercise Vital Sign    Days of Exercise per Week: 6 days    Minutes of Exercise per Session: 30 min  Stress: Stress Concern Present (01/23/2022)   [place]    Feeling of Stress : To some extent  Social Connections: Unknown  (01/23/2022)   Social Connection and Isolation Panel [NHANES]    Frequency of Communication with Friends and Family: More than three times a week    Frequency of Social Gatherings with Friends and Family: More than three times a week    Attends Religious Services: Not on file    Active Member of Clubs or Organizations: Not on file    Attends Archivist Meetings: Not on file    Marital Status: Married  Intimate Partner Violence: Not At Risk (01/23/2022)   Humiliation, Afraid, Rape, and Kick questionnaire    Fear of Current or Ex-Partner: No    Emotionally Abused: No    Physically Abused: No    Sexually Abused: No    Outpatient Medications Prior to Visit  Medication Sig Dispense Refill   cetirizine (ZYRTEC) 10 MG tablet Take 10 mg by mouth daily.     ezetimibe (ZETIA) 10 MG tablet Take 1 tablet (10 mg total) by mouth daily. 90 tablet 3   FLUoxetine (PROZAC) 40 MG capsule Take 1 capsule (40 mg total) by mouth daily. 90 capsule 1   fluticasone (FLONASE) 50 MCG/ACT nasal spray Use 2 spray(s) in each nostril once daily 48 g 3   Glucosamine-Chondroitin-MSM 1500-1200-500 MG PACK Take 1 tablet by mouth daily.     ibuprofen (ADVIL) 200 MG tablet Take 200 mg by mouth every 8 (eight) hours as needed for moderate pain.     LORazepam (ATIVAN) 0.5 MG tablet Take 1 tablet (0.5 mg total) by mouth at bedtime as needed for anxiety. 30 tablet 2   Melatonin 10 MG CAPS Take 5 mg by mouth at bedtime.     Multiple Vitamin (MULTIVITAMIN) capsule Take 1 capsule by mouth daily.     Omega-3 Fatty Acids (FISH OIL) 1000 MG CAPS Take 1,000 mg by mouth 2 (two) times daily.     omeprazole (PRILOSEC) 20 MG capsule Take 1 capsule (20 mg total) by mouth daily. 90 capsule 3   rosuvastatin (CRESTOR) 40 MG tablet Take 1 tablet (40 mg total) by mouth daily. 90 tablet 3   sacubitril-valsartan (ENTRESTO) 24-26 MG Take 1 tablet by mouth 2 (two) times daily. 180 tablet 3   Vitamin D, Cholecalciferol, 25 MCG (1000 UT)  CAPS Take 1,000 Units by mouth daily.     No facility-administered medications prior to visit.    No Known Allergies  Review of Systems CONSTITUTIONAL: see HPI E/N/T: has had pnd, mild sore throat CARDIOVASCULAR: Negative for chest pain, dizziness,  RESPIRATORY: see HPI GASTROINTESTINAL: Negative for abdominal pain, acid reflux symptoms, constipation, diarrhea, nausea and vomiting.          Objective:  PHYSICAL EXAM:   VS: BP 110/60 (BP Location: Left Arm, Patient Position: Sitting, Cuff Size: Normal)   Pulse 71   Temp (!) 97.3 F (36.3 C) (Temporal)   Ht 5'  7" (1.702 m)   Wt 135 lb 12.8 oz (61.6 kg)   SpO2 97%   BMI 21.27 kg/m   GEN: Well nourished, well developed, in no acute distress  HEENT: normal external ears and nose - normal external auditory canals and TMS -- Lips, Teeth and Gums - normal  Oropharynx -erythema/pnd Cardiac: RRR; no murmurs, rubs, Respiratory:  scattered rhonchi/rales - clears with cough - minimal wheeze Skin: warm and dry, no rash   Office Visit on 02/25/2022  Component Date Value Ref Range Status   SARS Coronavirus 2 Ag 02/25/2022 Negative  Negative Final   RSV Rapid Ag 02/25/2022 negative   Final      Health Maintenance Due  Topic Date Due   FOOT EXAM  Never done   OPHTHALMOLOGY EXAM  Never done   Diabetic kidney evaluation - Urine ACR  Never done   Zoster Vaccines- Shingrix (1 of 2) Never done   COVID-19 Vaccine (5 - 2023-24 season) 10/19/2021    There are no preventive care reminders to display for this patient.   Lab Results  Component Value Date   TSH 1.890 08/06/2021   Lab Results  Component Value Date   WBC 3.5 09/24/2021   HGB 12.1 09/24/2021   HCT 37.1 09/24/2021   MCV 98 (H) 09/24/2021   PLT 244 09/24/2021   Lab Results  Component Value Date   NA 142 09/24/2021   K 4.5 09/24/2021   CO2 21 09/24/2021   GLUCOSE 84 09/24/2021   BUN 12 09/24/2021   CREATININE 0.72 09/24/2021   BILITOT 0.9 09/24/2021    ALKPHOS 50 09/24/2021   AST 45 (H) 09/24/2021   ALT 38 (H) 09/24/2021   PROT 6.5 09/24/2021   ALBUMIN 4.3 09/24/2021   CALCIUM 10.0 10/01/2021   EGFR 89 09/24/2021   Lab Results  Component Value Date   CHOL 146 09/24/2021   Lab Results  Component Value Date   HDL 85 09/24/2021   Lab Results  Component Value Date   LDLCALC 50 09/24/2021   Lab Results  Component Value Date   TRIG 49 09/24/2021   Lab Results  Component Value Date   CHOLHDL 1.7 09/24/2021   Lab Results  Component Value Date   HGBA1C 5.5 09/24/2021       Assessment & Plan:   Problem List Items Addressed This Visit   None Visit Diagnoses     Acute upper respiratory infection    -  Primary   Relevant Medications   azithromycin (ZITHROMAX) 250 MG tablet   predniSONE (DELTASONE) 20 MG tablet   HYDROcodone bit-homatropine (HYDROMET) 5-1.5 MG/5ML syrup   Other Relevant Orders   POC COVID-19 BinaxNow (Completed)   POCT respiratory syncytial virus (Completed)   Bronchitis       Relevant Medications   azithromycin (ZITHROMAX) 250 MG tablet   predniSONE (DELTASONE) 20 MG tablet   HYDROcodone bit-homatropine (HYDROMET) 5-1.5 MG/5ML syrup      Meds ordered this encounter  Medications   azithromycin (ZITHROMAX) 250 MG tablet    Sig: Take 2 tablets on day 1, then 1 tablet daily on days 2 through 5    Dispense:  6 tablet    Refill:  0    Order Specific Question:   Supervising Provider    Answer:   [name] [[id number]]   predniSONE (DELTASONE) 20 MG tablet    Sig: Take 3 tablets (60 mg total) by mouth daily with breakfast for 3 days, THEN 2 tablets (40 mg  total) daily with breakfast for 3 days, THEN 1 tablet (20 mg total) daily with breakfast for 3 days.    Dispense:  18 tablet    Refill:  0    Order Specific Question:   Supervising Provider    Answer:   COX, Lynder Parents   HYDROcodone bit-homatropine (HYDROMET) 5-1.5 MG/5ML syrup    Sig: Take 5 mLs by mouth every 6 (six) hours as needed.     Dispense:  120 mL    Refill:  0    Order Specific Question:   Supervising Provider    AnswerShelton Silvas    Orders Placed This Encounter  Procedures   POC COVID-19 BinaxNow   POCT respiratory syncytial virus     Follow-up: Return if symptoms worsen or fail to improve.  An After Visit Summary was printed and given to the patient.  Yetta Flock [facility] [phone]

## 2022-03-04 NOTE — Progress Notes (Signed)
Remote ICD transmission.   

## 2022-03-06 ENCOUNTER — Encounter: Payer: Self-pay | Admitting: Family Medicine

## 2022-03-06 ENCOUNTER — Ambulatory Visit (INDEPENDENT_AMBULATORY_CARE_PROVIDER_SITE_OTHER): Payer: PPO | Admitting: Family Medicine

## 2022-03-06 VITALS — BP 100/60 | HR 72 | Temp 97.6°F | Resp 15 | Ht 67.0 in | Wt 139.0 lb

## 2022-03-06 DIAGNOSIS — J189 Pneumonia, unspecified organism: Secondary | ICD-10-CM

## 2022-03-06 DIAGNOSIS — R052 Subacute cough: Secondary | ICD-10-CM | POA: Diagnosis not present

## 2022-03-06 NOTE — Progress Notes (Unsigned)
Acute Office Visit  Subjective:    Patient ID: Audrey Ruiz, female    DOB: [birth year], 72 y.o.   MRN: [id number]  Chief Complaint  Patient presents with   Cough   Chest congestion    HPI: Patient is in today for dry/productive cough. She mentioned to wake her up. She takes something for cough, but in the morning starts again. She denies fever, chills.  Patient was seen on February 25, 2022 by Adron Bene here in our office.  1 week prior to that she was seen in urgent care and was tested for flu and COVID and were negative.  Patient was treated with Zithromax, prednisone, Hydromet.  COVID-19 and RSV were repeated.  Chest congestion has improved. Still feels rattly in chest and severe fatigue. Dry cough. She has coughed som much she has pulled a muscle in her lower right back.   Past Medical History:  Diagnosis Date   Abnormal nuclear stress test 08/08/2020   Abnormal nuclear stress test 08/08/2020   Anxiety    Closed fracture of distal end of left radius 08/16/2020   Depressed left ventricular ejection fraction    Depression    Diabetes mellitus without complication ([place])    Dilated cardiomyopathy ([place]) 08/08/2020   Encounter for Medicare annual wellness exam 10/19/2019   Gastroesophageal reflux disease 10/19/2019   GERD (gastroesophageal reflux disease)    Hydropneumothorax 02/08/2021   Hyperlipidemia    Hyperlipidemia associated with type 2 diabetes mellitus ([place]) 10/19/2019   LBBB (left bundle branch block) 08/17/2020   Mitral valve prolapse    Nonischemic cardiomyopathy ([place]) 08/17/2020   Osteopenia 10/19/2019   Sinusitis 08/16/2019   Upper respiratory tract infection due to COVID-19 virus 03/18/2021    Past Surgical History:  Procedure Laterality Date   BIV ICD INSERTION CRT-D N/A 02/05/2021   Procedure: BIV ICD INSERTION CRT-D;  Surgeon: [name], MD;  Location: [facility];  Service: Cardiovascular;  Laterality: N/A;   BREAST  LUMPECTOMY Left 2010   CATARACT EXTRACTION BILATERAL W/ ANTERIOR VITRECTOMY  2021   LEFT HEART CATH AND CORONARY ANGIOGRAPHY N/A 08/08/2020   Procedure: LEFT HEART CATH AND CORONARY ANGIOGRAPHY;  Surgeon: Leonie Man, MD;  Location: [facility];  Service: Cardiovascular;  Laterality: N/A;    Family History  Problem Relation Age of Onset   Osteoporosis Mother    Heart disease Father    Hyperlipidemia Father    Coronary artery disease Father    Diabetes Sister    Breast cancer Daughter    Diabetes Maternal Grandfather     Social History   Socioeconomic History   Marital status: Married    Spouse name: [name]   Number of children: 2   Years of education: Not on file   Highest education level: Master's degree (e.g., MA, MS, MEng, MEd, MSW, MBA)  Occupational History   Occupation: Retired  Tobacco Use   Smoking status: Never   Smokeless tobacco: Never  Vaping Use   Vaping Use: Never used  Substance and Sexual Activity   Alcohol use: Yes    Alcohol/week: 2.0 standard drinks of alcohol    Types: 2 Glasses of wine per week    Comment: occasional (white wine)   Drug use: Never   Sexual activity: Not Currently  Other Topics Concern   Not on file  Social History Narrative   Son lives in [place], Daughter lives in [place]   Patient 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  mother and father as well as two aunts   Social Determinants of Health   Financial Resource Strain: Medium Risk (01/23/2022)   Overall Financial Resource Strain (CARDIA)    Difficulty of Paying Living Expenses: Somewhat hard  Food Insecurity: No Food Insecurity (01/23/2022)   Hunger Vital Sign    Worried About Running Out of Food in the Last Year: Never true    [place] in the Last Year: Never true  Transportation Needs: No Transportation Needs (01/23/2022)   PRAPARE - Hydrologist (Medical): No    Lack of Transportation (Non-Medical): No  Physical Activity: Sufficiently  Active (01/23/2022)   Exercise Vital Sign    Days of Exercise per Week: 6 days    Minutes of Exercise per Session: 30 min  Stress: Stress Concern Present (01/23/2022)   [place]    Feeling of Stress : To some extent  Social Connections: Unknown (01/23/2022)   Social Connection and Isolation Panel [NHANES]    Frequency of Communication with Friends and Family: More than three times a week    Frequency of Social Gatherings with Friends and Family: More than three times a week    Attends Religious Services: Not on file    Active Member of Clubs or Organizations: Not on file    Attends Archivist Meetings: Not on file    Marital Status: Married  Intimate Partner Violence: Not At Risk (01/23/2022)   Humiliation, Afraid, Rape, and Kick questionnaire    Fear of Current or Ex-Partner: No    Emotionally Abused: No    Physically Abused: No    Sexually Abused: No    Outpatient Medications Prior to Visit  Medication Sig Dispense Refill   cetirizine (ZYRTEC) 10 MG tablet Take 10 mg by mouth daily.     ezetimibe (ZETIA) 10 MG tablet Take 1 tablet (10 mg total) by mouth daily. 90 tablet 3   FLUoxetine (PROZAC) 40 MG capsule Take 1 capsule (40 mg total) by mouth daily. 90 capsule 1   fluticasone (FLONASE) 50 MCG/ACT nasal spray Use 2 spray(s) in each nostril once daily 48 g 3   Glucosamine-Chondroitin-MSM 1500-1200-500 MG PACK Take 1 tablet by mouth daily.     ibuprofen (ADVIL) 200 MG tablet Take 200 mg by mouth every 8 (eight) hours as needed for moderate pain.     LORazepam (ATIVAN) 0.5 MG tablet Take 1 tablet (0.5 mg total) by mouth at bedtime as needed for anxiety. 30 tablet 2   Multiple Vitamin (MULTIVITAMIN) capsule Take 1 capsule by mouth daily.     Omega-3 Fatty Acids (FISH OIL) 1000 MG CAPS Take 1,000 mg by mouth 2 (two) times daily.     omeprazole (PRILOSEC) 20 MG capsule Take 1 capsule (20 mg total) by mouth daily.  90 capsule 3   rosuvastatin (CRESTOR) 40 MG tablet Take 1 tablet (40 mg total) by mouth daily. 90 tablet 3   sacubitril-valsartan (ENTRESTO) 24-26 MG Take 1 tablet by mouth 2 (two) times daily. 180 tablet 3   Vitamin D, Cholecalciferol, 25 MCG (1000 UT) CAPS Take 1,000 Units by mouth daily.     Melatonin 10 MG CAPS Take 5 mg by mouth at bedtime. (Patient not taking: Reported on 03/06/2022)     HYDROcodone bit-homatropine (HYDROMET) 5-1.5 MG/5ML syrup Take 5 mLs by mouth every 6 (six) hours as needed. (Patient not taking: Reported on 03/06/2022) 120 mL 0  No facility-administered medications prior to visit.    No Known Allergies  Review of Systems  Constitutional:  Negative for chills, fatigue and fever.  HENT:  Negative for congestion, ear pain and sore throat.   Respiratory:  Positive for cough. Negative for shortness of breath.   Cardiovascular:  Negative for chest pain and palpitations.  Gastrointestinal:  Negative for abdominal pain, constipation, diarrhea, nausea and vomiting.  Endocrine: Negative for polydipsia, polyphagia and polyuria.  Genitourinary:  Negative for difficulty urinating and dysuria.  Musculoskeletal:  Negative for arthralgias, back pain and myalgias.  Skin:  Negative for rash.  Neurological:  Negative for headaches.  Psychiatric/Behavioral:  Negative for dysphoric mood. The patient is not nervous/anxious.        Objective:    Physical Exam Vitals reviewed.  Constitutional:      Appearance: Normal appearance. She is ill-appearing.  HENT:     Right Ear: Tympanic membrane, ear canal and external ear normal.     Left Ear: Tympanic membrane, ear canal and external ear normal.     Nose: Congestion present.     Mouth/Throat:     Pharynx: Oropharynx is clear.  Cardiovascular:     Rate and Rhythm: Normal rate and regular rhythm.     Heart sounds: Normal heart sounds. No murmur heard. Pulmonary:     Effort: Pulmonary effort is normal. No respiratory distress.      Breath sounds: Wheezing and rhonchi present.  Lymphadenopathy:     Cervical: No cervical adenopathy.  Neurological:     Mental Status: She is alert and oriented to person, place, and time.  Psychiatric:        Mood and Affect: Mood normal.        Behavior: Behavior normal.     BP 100/60   Pulse 72   Temp 97.6 F (36.4 C)   Resp 15   Ht '5\' 7"'$  (1.702 m)   Wt 139 lb (63 kg)   SpO2 97%   BMI 21.77 kg/m  Wt Readings from Last 3 Encounters:  03/06/22 139 lb (63 kg)  02/25/22 135 lb 12.8 oz (61.6 kg)  10/30/21 134 lb 3.2 oz (60.9 kg)    Health Maintenance Due  Topic Date Due   FOOT EXAM  Never done   OPHTHALMOLOGY EXAM  Never done   Diabetic kidney evaluation - Urine ACR  Never done   Zoster Vaccines- Shingrix (1 of 2) Never done   COVID-19 Vaccine (5 - 2023-24 season) 10/19/2021    There are no preventive care reminders to display for this patient.   Lab Results  Component Value Date   TSH 1.890 08/06/2021   Lab Results  Component Value Date   WBC 3.5 09/24/2021   HGB 12.1 09/24/2021   HCT 37.1 09/24/2021   MCV 98 (H) 09/24/2021   PLT 244 09/24/2021   Lab Results  Component Value Date   NA 142 09/24/2021   K 4.5 09/24/2021   CO2 21 09/24/2021   GLUCOSE 84 09/24/2021   BUN 12 09/24/2021   CREATININE 0.72 09/24/2021   BILITOT 0.9 09/24/2021   ALKPHOS 50 09/24/2021   AST 45 (H) 09/24/2021   ALT 38 (H) 09/24/2021   PROT 6.5 09/24/2021   ALBUMIN 4.3 09/24/2021   CALCIUM 10.0 10/01/2021   EGFR 89 09/24/2021   Lab Results  Component Value Date   CHOL 146 09/24/2021   Lab Results  Component Value Date   HDL 85 09/24/2021   Lab Results  Component Value Date   LDLCALC 50 09/24/2021   Lab Results  Component Value Date   TRIG 49 09/24/2021   Lab Results  Component Value Date   CHOLHDL 1.7 09/24/2021   Lab Results  Component Value Date   HGBA1C 5.5 09/24/2021       Assessment & Plan:  Iqra was seen today for cough and chest  congestion.  Pneumonia of right lower lobe due to infectious organism Assessment & Plan: Ordered a chest x-ray.  It showed pneumonia. Sent prescription for Augmentin and Hydromet cough syrup. Recommend repeat chest x-ray in 4 to 8 weeks.    Orders: -     DG Chest 2 View; Future    No orders of the defined types were placed in this encounter.   Orders Placed This Encounter  Procedures   DG Chest 2 View     Follow-up: No follow-ups on file.  An After Visit Summary was printed and given to the patient.  [name], MD [facility] [phone]

## 2022-03-07 ENCOUNTER — Other Ambulatory Visit: Payer: Self-pay | Admitting: Family Medicine

## 2022-03-07 DIAGNOSIS — J069 Acute upper respiratory infection, unspecified: Secondary | ICD-10-CM

## 2022-03-07 DIAGNOSIS — J40 Bronchitis, not specified as acute or chronic: Secondary | ICD-10-CM

## 2022-03-07 MED ORDER — HYDROCODONE BIT-HOMATROP MBR 5-1.5 MG/5ML PO SOLN: 5.0000 mL | Freq: Four times a day (QID) | ORAL | 0 refills | Status: DC | PRN

## 2022-03-07 MED ORDER — AMOXICILLIN-POT CLAVULANATE 875-125 MG PO TABS: 1.0000 | ORAL_TABLET | Freq: Two times a day (BID) | ORAL | 0 refills | Status: DC

## 2022-03-13 DIAGNOSIS — J189 Pneumonia, unspecified organism: Secondary | ICD-10-CM | POA: Insufficient documentation

## 2022-03-13 NOTE — Assessment & Plan Note (Signed)
Ordered a chest x-ray.  It showed pneumonia. Sent prescription for Augmentin and Hydromet cough syrup. Recommend repeat chest x-ray in 4 to 8 weeks.

## 2022-03-20 ENCOUNTER — Ambulatory Visit (INDEPENDENT_AMBULATORY_CARE_PROVIDER_SITE_OTHER): Payer: PPO | Admitting: Family Medicine

## 2022-03-20 VITALS — BP 108/68 | HR 85 | Temp 97.5°F | Ht 66.5 in | Wt 135.0 lb

## 2022-03-20 DIAGNOSIS — J189 Pneumonia, unspecified organism: Secondary | ICD-10-CM | POA: Diagnosis not present

## 2022-03-20 DIAGNOSIS — E538 Deficiency of other specified B group vitamins: Secondary | ICD-10-CM | POA: Diagnosis not present

## 2022-03-20 DIAGNOSIS — H6122 Impacted cerumen, left ear: Secondary | ICD-10-CM | POA: Insufficient documentation

## 2022-03-20 DIAGNOSIS — D649 Anemia, unspecified: Secondary | ICD-10-CM | POA: Diagnosis not present

## 2022-03-20 DIAGNOSIS — R5383 Other fatigue: Secondary | ICD-10-CM | POA: Diagnosis not present

## 2022-03-20 NOTE — Progress Notes (Signed)
Acute Office Visit  Subjective:    Patient ID: Audrey Ruiz, female    DOB: Jul 09, 1950, 72 y.o.   MRN: [id number]  Chief Complaint  Patient presents with   Ear Fullness    HPI: Patient is in today for ear irrigation.  Patient's been using Debrox for the last week.  Patient also was treated for pneumonia and was having some left lower lumbar buttock pain from coughing she feels like.  She says she leaned over and has had a sudden pain near and now nothing seems to be helping it.  I noticed the patient was very pale and she is complaining of fatigue.  Past Medical History:  Diagnosis Date   Abnormal nuclear stress test 08/08/2020   Abnormal nuclear stress test 08/08/2020   Anxiety    Closed fracture of distal end of left radius 08/16/2020   Depressed left ventricular ejection fraction    Depression    Diabetes mellitus without complication ([place])    Dilated cardiomyopathy ([place]) 08/08/2020   Encounter for Medicare annual wellness exam 10/19/2019   Gastroesophageal reflux disease 10/19/2019   GERD (gastroesophageal reflux disease)    Hydropneumothorax 02/08/2021   Hyperlipidemia    Hyperlipidemia associated with type 2 diabetes mellitus ([place]) 10/19/2019   LBBB (left bundle branch block) 08/17/2020   Mitral valve prolapse    Nonischemic cardiomyopathy ([place]) 08/17/2020   Osteopenia 10/19/2019   Sinusitis 08/16/2019   Upper respiratory tract infection due to COVID-19 virus 03/18/2021    Past Surgical History:  Procedure Laterality Date   BIV ICD INSERTION CRT-D N/A 02/05/2021   Procedure: BIV ICD INSERTION CRT-D;  Surgeon: [name], MD;  Location: [facility];  Service: Cardiovascular;  Laterality: N/A;   BREAST LUMPECTOMY Left 2010   CATARACT EXTRACTION BILATERAL W/ ANTERIOR VITRECTOMY  2021   LEFT HEART CATH AND CORONARY ANGIOGRAPHY N/A 08/08/2020   Procedure: LEFT HEART CATH AND CORONARY ANGIOGRAPHY;  Surgeon: Leonie Man, MD;  Location:  [facility];  Service: Cardiovascular;  Laterality: N/A;    Family History  Problem Relation Age of Onset   Osteoporosis Mother    Heart disease Father    Hyperlipidemia Father    Coronary artery disease Father    Diabetes Sister    Breast cancer Daughter    Diabetes Maternal Grandfather     Social History   Socioeconomic History   Marital status: Married    Spouse name: [name]   Number of children: 2   Years of education: Not on file   Highest education level: Master's degree (e.g., MA, MS, MEng, MEd, MSW, MBA)  Occupational History   Occupation: Retired  Tobacco Use   Smoking status: Never   Smokeless tobacco: Never  Vaping Use   Vaping Use: Never used  Substance and Sexual Activity   Alcohol use: Yes    Alcohol/week: 2.0 standard drinks of alcohol    Types: 2 Glasses of wine per week    Comment: occasional (white wine)   Drug use: Never   Sexual activity: Not Currently  Other Topics Concern   Not on file  Social History Narrative   Son lives in [place], Daughter lives in [place]   Patient 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 mother and father as well as two aunts   Social Determinants of Health   Financial Resource Strain: Medium Risk (01/23/2022)   Overall Financial Resource Strain (CARDIA)    Difficulty of Paying Living Expenses: Somewhat hard  Food  Insecurity: No Food Insecurity (01/23/2022)   Hunger Vital Sign    Worried About Running Out of Food in the Last Year: Never true    Ran Out of Food in the Last Year: Never true  Transportation Needs: No Transportation Needs (01/23/2022)   PRAPARE - Hydrologist (Medical): No    Lack of Transportation (Non-Medical): No  Physical Activity: Sufficiently Active (01/23/2022)   Exercise Vital Sign    Days of Exercise per Week: 6 days    Minutes of Exercise per Session: 30 min  Stress: Stress Concern Present (01/23/2022)   [place]    Feeling of Stress : To some extent  Social Connections: Unknown (01/23/2022)   Social Connection and Isolation Panel [NHANES]    Frequency of Communication with Friends and Family: More than three times a week    Frequency of Social Gatherings with Friends and Family: More than three times a week    Attends Religious Services: Not on file    Active Member of Clubs or Organizations: Not on file    Attends Archivist Meetings: Not on file    Marital Status: Married  Intimate Partner Violence: Not At Risk (01/23/2022)   Humiliation, Afraid, Rape, and Kick questionnaire    Fear of Current or Ex-Partner: No    Emotionally Abused: No    Physically Abused: No    Sexually Abused: No    Outpatient Medications Prior to Visit  Medication Sig Dispense Refill   amoxicillin-clavulanate (AUGMENTIN) 875-125 MG tablet Take 1 tablet by mouth 2 (two) times daily. 20 tablet 0   cetirizine (ZYRTEC) 10 MG tablet Take 10 mg by mouth daily.     ezetimibe (ZETIA) 10 MG tablet Take 1 tablet (10 mg total) by mouth daily. 90 tablet 3   FLUoxetine (PROZAC) 40 MG capsule Take 1 capsule (40 mg total) by mouth daily. 90 capsule 1   fluticasone (FLONASE) 50 MCG/ACT nasal spray Use 2 spray(s) in each nostril once daily 48 g 3   Glucosamine-Chondroitin-MSM 1500-1200-500 MG PACK Take 1 tablet by mouth daily.     HYDROcodone bit-homatropine (HYDROMET) 5-1.5 MG/5ML syrup Take 5 mLs by mouth every 6 (six) hours as needed. 120 mL 0   ibuprofen (ADVIL) 200 MG tablet Take 200 mg by mouth every 8 (eight) hours as needed for moderate pain.     LORazepam (ATIVAN) 0.5 MG tablet Take 1 tablet (0.5 mg total) by mouth at bedtime as needed for anxiety. 30 tablet 2   Melatonin 10 MG CAPS Take 5 mg by mouth at bedtime. (Patient not taking: Reported on 03/06/2022)     Multiple Vitamin (MULTIVITAMIN) capsule Take 1 capsule by mouth daily.     Omega-3 Fatty Acids (FISH OIL) 1000 MG CAPS Take 1,000 mg by mouth 2  (two) times daily.     omeprazole (PRILOSEC) 20 MG capsule Take 1 capsule (20 mg total) by mouth daily. 90 capsule 3   rosuvastatin (CRESTOR) 40 MG tablet Take 1 tablet (40 mg total) by mouth daily. 90 tablet 3   sacubitril-valsartan (ENTRESTO) 24-26 MG Take 1 tablet by mouth 2 (two) times daily. 180 tablet 3   Vitamin D, Cholecalciferol, 25 MCG (1000 UT) CAPS Take 1,000 Units by mouth daily.     No facility-administered medications prior to visit.    No Known Allergies  Review of Systems  Constitutional:  Positive for fatigue.  HENT:  Negative for ear  pain, sinus pain and sore throat.   Respiratory:  Positive for cough. Negative for shortness of breath.   Cardiovascular:  Negative for chest pain.  Musculoskeletal:  Positive for back pain.       Objective:        03/20/2022    1:13 PM 03/06/2022    1:50 PM 02/25/2022    4:10 PM  Vitals with BMI  Height 5' 6.5" '5\' 7"'$  '5\' 7"'$   Weight 135 lbs 139 lbs 135 lbs 13 oz  BMI 21.47 25.42 70.62  Systolic 376 283 151  Diastolic 68 60 60  Pulse 85 72 71    No data found.   Physical Exam Vitals reviewed.  Constitutional:      Appearance: Normal appearance.  HENT:     Right Ear: External ear normal.     Left Ear: There is impacted cerumen.     Nose: Nose normal.     Mouth/Throat:     Pharynx: No oropharyngeal exudate or posterior oropharyngeal erythema.  Eyes:     Comments: Pale conjunctiva.  Yesterday yesterday or Monday  Cardiovascular:     Rate and Rhythm: Normal rate and regular rhythm.     Heart sounds: Normal heart sounds.  Pulmonary:     Effort: Pulmonary effort is normal.     Breath sounds: Normal breath sounds.  Neurological:     Mental Status: She is alert and oriented to person, place, and time.  Psychiatric:        Mood and Affect: Mood normal.        Behavior: Behavior normal.     Health Maintenance Due  Topic Date Due   FOOT EXAM  Never done   OPHTHALMOLOGY EXAM  Never done   Diabetic kidney evaluation -  Urine ACR  Never done   Zoster Vaccines- Shingrix (1 of 2) Never done   COVID-19 Vaccine (5 - 2023-24 season) 10/19/2021    There are no preventive care reminders to display for this patient.   Lab Results  Component Value Date   TSH 1.890 08/06/2021   Lab Results  Component Value Date   WBC 5.3 03/20/2022   HGB 10.9 (L) 03/20/2022   HCT 34.4 03/20/2022   MCV 96 03/20/2022   PLT 212 03/20/2022   Lab Results  Component Value Date   NA 140 03/20/2022   K 4.9 03/20/2022   CO2 23 03/20/2022   GLUCOSE 78 03/20/2022   BUN 10 03/20/2022   CREATININE 0.65 03/20/2022   BILITOT 0.5 03/20/2022   ALKPHOS 89 03/20/2022   AST 23 03/20/2022   ALT 29 03/20/2022   PROT 6.0 03/20/2022   ALBUMIN 4.0 03/20/2022   CALCIUM 10.2 03/20/2022   EGFR 93 03/20/2022   Lab Results  Component Value Date   CHOL 146 09/24/2021   Lab Results  Component Value Date   HDL 85 09/24/2021   Lab Results  Component Value Date   LDLCALC 50 09/24/2021   Lab Results  Component Value Date   TRIG 49 09/24/2021   Lab Results  Component Value Date   CHOLHDL 1.7 09/24/2021   Lab Results  Component Value Date   HGBA1C 5.5 09/24/2021       Assessment & Plan:  Anemia, normocytic normochromic Hb came back low. Add b12, folate.  Hearing loss of left ear due to cerumen impaction Irrigation successful.  Other fatigue Check labs  Pneumonia of right lower lobe due to infectious organism Complete antibiotic.     No  orders of the defined types were placed in this encounter.   Orders Placed This Encounter  Procedures   CBC with Differential/Platelet   Comprehensive metabolic panel     Follow-up: Return if symptoms worsen or fail to improve.  An After Visit Summary was printed and given to the patient.  [name], MD [facility] [phone]

## 2022-03-21 ENCOUNTER — Other Ambulatory Visit: Payer: Self-pay

## 2022-03-21 DIAGNOSIS — J189 Pneumonia, unspecified organism: Secondary | ICD-10-CM

## 2022-03-21 LAB — CBC WITH DIFFERENTIAL/PLATELET
Basophils Absolute: 0.1 10*3/uL (ref 0.0–0.2)
Basos: 1 %
EOS (ABSOLUTE): 0.3 10*3/uL (ref 0.0–0.4)
Eos: 6 %
Hematocrit: 34.4 % (ref 34.0–46.6)
Hemoglobin: 10.9 g/dL — ABNORMAL LOW (ref 11.1–15.9)
Immature Grans (Abs): 0 10*3/uL (ref 0.0–0.1)
Immature Granulocytes: 0 %
Lymphocytes Absolute: 1.3 10*3/uL (ref 0.7–3.1)
Lymphs: 24 %
MCH: 30.5 pg (ref 26.6–33.0)
MCHC: 31.7 g/dL (ref 31.5–35.7)
MCV: 96 fL (ref 79–97)
Monocytes Absolute: 0.4 10*3/uL (ref 0.1–0.9)
Monocytes: 7 %
Neutrophils Absolute: 3.3 10*3/uL (ref 1.4–7.0)
Neutrophils: 62 %
Platelets: 212 10*3/uL (ref 150–450)
RBC: 3.57 x10E6/uL — ABNORMAL LOW (ref 3.77–5.28)
RDW: 12.5 % (ref 11.7–15.4)
WBC: 5.3 10*3/uL (ref 3.4–10.8)

## 2022-03-21 LAB — COMPREHENSIVE METABOLIC PANEL
ALT: 29 IU/L (ref 0–32)
AST: 23 IU/L (ref 0–40)
Albumin/Globulin Ratio: 2 (ref 1.2–2.2)
Albumin: 4 g/dL (ref 3.8–4.8)
Alkaline Phosphatase: 89 IU/L (ref 44–121)
BUN/Creatinine Ratio: 15 (ref 12–28)
BUN: 10 mg/dL (ref 8–27)
Bilirubin Total: 0.5 mg/dL (ref 0.0–1.2)
CO2: 23 mmol/L (ref 20–29)
Calcium: 10.2 mg/dL (ref 8.7–10.3)
Chloride: 104 mmol/L (ref 96–106)
Creatinine, Ser: 0.65 mg/dL (ref 0.57–1.00)
Globulin, Total: 2 g/dL (ref 1.5–4.5)
Glucose: 78 mg/dL (ref 70–99)
Potassium: 4.9 mmol/L (ref 3.5–5.2)
Sodium: 140 mmol/L (ref 134–144)
Total Protein: 6 g/dL (ref 6.0–8.5)
eGFR: 93 mL/min/{1.73_m2} (ref >59)

## 2022-03-22 ENCOUNTER — Other Ambulatory Visit: Payer: Self-pay | Admitting: Cardiology

## 2022-03-22 ENCOUNTER — Other Ambulatory Visit: Payer: Self-pay

## 2022-03-24 ENCOUNTER — Encounter: Payer: Self-pay | Admitting: Family Medicine

## 2022-03-24 DIAGNOSIS — D649 Anemia, unspecified: Secondary | ICD-10-CM | POA: Insufficient documentation

## 2022-03-24 NOTE — Assessment & Plan Note (Signed)
Hb came back low. Add b12, folate.

## 2022-03-24 NOTE — Assessment & Plan Note (Signed)
Irrigation successful.

## 2022-03-24 NOTE — Assessment & Plan Note (Signed)
Check labs 

## 2022-03-24 NOTE — Assessment & Plan Note (Signed)
Complete antibiotic.

## 2022-03-26 LAB — SPECIMEN STATUS REPORT

## 2022-03-26 LAB — B12 AND FOLATE PANEL
Folate: 3.9 ng/mL (ref >3.0)
Vitamin B-12: 627 pg/mL (ref 232–1245)

## 2022-03-26 LAB — IRON,TIBC AND FERRITIN PANEL
Ferritin: 544 ng/mL — ABNORMAL HIGH (ref 15–150)
Iron Saturation: 25 % (ref 15–55)
Iron: 64 ug/dL (ref 27–139)
Total Iron Binding Capacity: 260 ug/dL (ref 250–450)
UIBC: 196 ug/dL (ref 118–369)

## 2022-03-28 ENCOUNTER — Other Ambulatory Visit: Payer: Self-pay | Admitting: Family Medicine

## 2022-03-28 DIAGNOSIS — K219 Gastro-esophageal reflux disease without esophagitis: Secondary | ICD-10-CM

## 2022-04-01 ENCOUNTER — Other Ambulatory Visit: Payer: Self-pay | Admitting: Oncology

## 2022-04-01 ENCOUNTER — Other Ambulatory Visit: Payer: Self-pay

## 2022-04-01 DIAGNOSIS — D649 Anemia, unspecified: Secondary | ICD-10-CM

## 2022-04-01 DIAGNOSIS — D508 Other iron deficiency anemias: Secondary | ICD-10-CM

## 2022-04-01 NOTE — Progress Notes (Signed)
[facility]  [address] [place],  [place]  [address] [phone]  Clinic Day:  04/02/2022  Referring physician: [name], MD   HISTORY OF PRESENT ILLNESS:  The patient is a 72 y.o. woman who I was asked to see for anemia.  Recent labs at her primary care office showed a low hemoglobin of 10.9.  However, iron, B12, and folate levels came back completely normal.  According to the patient, she has felt weak over the past few weeks.  Some of this could be attributed to an upper respiratory infection, for which she has been on 2 different rounds of antibiotics to help alleviate.  She denies having any overt forms of blood loss to explain her anemia.  Of note, this patient was also diagnosed with stage IIA (T3 N0 M0) hormone positive breast cancer, status post a lumpectomy in November 2010. This was followed by adjuvant chemotherapy and radiation.  She also took anastrozole for 6+ years for her adjuvant endocrine management.  Mammograms since then have shown no evidence of disease recurrence.   PHYSICAL EXAM:  Blood pressure 137/72, pulse 80, temperature 98 F (36.7 C), resp. rate 14, height 5' 6.5" (1.689 m), weight 138 lb 9.6 oz (62.9 kg), SpO2 92 %. Wt Readings from Last 3 Encounters:  04/02/22 138 lb 9.6 oz (62.9 kg)  03/20/22 135 lb (61.2 kg)  03/06/22 139 lb (63 kg)   Body mass index is 22.04 kg/m. Performance status (ECOG): 0 - Asymptomatic  Physical Exam Constitutional:      Appearance: Normal appearance. She is not ill-appearing.  HENT:     Mouth/Throat:     Mouth: Mucous membranes are moist.     Pharynx: Oropharynx is clear. No oropharyngeal exudate or posterior oropharyngeal erythema.  Cardiovascular:     Rate and Rhythm: Normal rate and regular rhythm.     Heart sounds: No murmur heard.    No friction rub. No gallop.  Pulmonary:     Effort: Pulmonary effort is normal. No respiratory distress.     Breath sounds: Normal breath  sounds. No wheezing, rhonchi or rales.  Chest:  Breasts:    Right: No swelling, bleeding, inverted nipple, mass, nipple discharge or skin change.     Left: No swelling, bleeding, inverted nipple, mass, nipple discharge or skin change.  Abdominal:     General: Bowel sounds are normal. There is no distension.     Palpations: Abdomen is soft. There is no mass.     Tenderness: There is no abdominal tenderness.  Musculoskeletal:        General: No swelling or tenderness.     Cervical back: Normal range of motion and neck supple.     Right lower leg: No edema.     Left lower leg: No edema.  Lymphadenopathy:     Cervical: No cervical adenopathy.     Right cervical: No superficial, deep or posterior cervical adenopathy.    Left cervical: No superficial, deep or posterior cervical adenopathy.     Upper Body:     Right upper body: No supraclavicular or axillary adenopathy.     Left upper body: No supraclavicular or axillary adenopathy.     Lower Body: No right inguinal adenopathy. No left inguinal adenopathy.  Skin:    General: Skin is warm.     Coloration: Skin is not jaundiced.     Findings: No lesion or rash.  Neurological:     General: No focal deficit present.  Mental Status: She is alert and oriented to person, place, and time. Mental status is at baseline.  Psychiatric:        Mood and Affect: Mood normal.        Behavior: Behavior normal.        Thought Content: Thought content normal.        Judgment: Judgment normal.    LABS:     Latest Reference Range & Units 03/20/22 11:51  Iron 27 - 139 ug/dL 64  UIBC 118 - 369 ug/dL 196  TIBC 250 - 450 ug/dL 260  Ferritin 15 - 150 ng/mL 544 (H)  Iron Saturation 15 - 55 % 25  Folate >3.0 ng/mL 3.9  Vitamin B12 232 - 1,245 pg/mL 627  (H): Data is abnormally high  ASSESSMENT & PLAN:  Assessment/Plan:  A 72 y.o. woman who I was asked to reevaluate for anemia.  Overall, I am pleased as her hemoglobin has risen to 11.6 from 10.9 in  just 2 weeks.  Recent labs suggested no form of nutritional deficiency factoring into her anemia.  I will check a TSH level, as well as a serum protein electrophoresis, to ensure either severe thyroid disease or a plasma cell dyscrasia is not factoring into her mild anemia.  Overall, there is nothing per her physical exam or history to suggest something ominous is present.  As it pertains to her stage IIA hormone positive breast cancer, she is over 13 years out from her surgery.  There is nothing to suggest her cancer is back or may be playing some type of role into her anemia.  I will see her back in 6 weeks for repeat clinical assessment.  The patient understands all the plans discussed today and is in agreement with them.  Takia Runyon Macarthur Critchley, MD

## 2022-04-02 ENCOUNTER — Inpatient Hospital Stay: Payer: PPO | Attending: Oncology | Admitting: Oncology

## 2022-04-02 ENCOUNTER — Inpatient Hospital Stay: Payer: PPO

## 2022-04-02 ENCOUNTER — Other Ambulatory Visit: Payer: Self-pay | Admitting: Oncology

## 2022-04-02 VITALS — BP 137/72 | HR 80 | Temp 98.0°F | Resp 14 | Ht 66.5 in | Wt 138.6 lb

## 2022-04-02 DIAGNOSIS — D649 Anemia, unspecified: Secondary | ICD-10-CM

## 2022-04-02 DIAGNOSIS — Z853 Personal history of malignant neoplasm of breast: Secondary | ICD-10-CM | POA: Diagnosis not present

## 2022-04-02 DIAGNOSIS — Z79899 Other long term (current) drug therapy: Secondary | ICD-10-CM | POA: Diagnosis not present

## 2022-04-02 LAB — CMP (CANCER CENTER ONLY)
ALT: 25 U/L (ref 0–44)
AST: 29 U/L (ref 15–41)
Albumin: 4.1 g/dL (ref 3.5–5.0)
Alkaline Phosphatase: 85 U/L (ref 38–126)
Anion gap: 8 (ref 5–15)
BUN: 13 mg/dL (ref 8–23)
CO2: 25 mmol/L (ref 22–32)
Calcium: 9.9 mg/dL (ref 8.9–10.3)
Chloride: 102 mmol/L (ref 98–111)
Creatinine: 0.73 mg/dL (ref 0.44–1.00)
GFR, Estimated: >60 mL/min (ref >60)
Glucose, Bld: 91 mg/dL (ref 70–99)
Potassium: 3.9 mmol/L (ref 3.5–5.1)
Sodium: 135 mmol/L (ref 135–145)
Total Bilirubin: 0.6 mg/dL (ref 0.3–1.2)
Total Protein: 7.2 g/dL (ref 6.5–8.1)

## 2022-04-02 LAB — CBC AND DIFFERENTIAL
HCT: 35 — AB (ref 36–46)
Hemoglobin: 11.6 — AB (ref 12.0–16.0)
Neutrophils Absolute: 2.28
Platelets: 289 10*3/uL (ref 150–400)
WBC: 4.3

## 2022-04-02 LAB — CBC: RBC: 3.78 — AB (ref 3.87–5.11)

## 2022-04-02 LAB — TSH: TSH: 4.845 u[IU]/mL — ABNORMAL HIGH (ref 0.350–4.500)

## 2022-04-03 NOTE — Assessment & Plan Note (Signed)
Well controlled.  No changes to medicines. Zetia 10 mg daily, rosuvastatin 40 mg daily, fish oil 1000 mg BID. Continue to work on eating a healthy diet and exercise.  Labs drawn today.

## 2022-04-03 NOTE — Progress Notes (Signed)
Subjective:  Patient ID: Audrey Ruiz, female    DOB: 1950/04/30  Age: 72 y.o. MRN: [id number]  Chief Complaint  Patient presents with   Hyperlipidemia   Congestive Heart Failure    HPI Pre- Diabetes: Last Hba1c: 5.5  Nonischemic cardiomyopathy thought to be due to adriamycin. CORONARY ARTERY DISEASE noncritical cad, CONGESTIVE HEART FAILURE - on entresto. Patient is status post BiV ICD 2022 with improved EF on her most recent echocardiogram 50 to 55%   Hyperlipidemia: on zetia 10 mg once daily, on crestor 40 mg once daily at night.   GERD: Currently on omeprazole 20 mg once daily.  Depression, mild, recurrent: Currently on Prozac 40 mg once daily.    Insomnia: takes melatonin. would like lorazepam.  Patient was given a prescription for lorazepam nearly 2 years ago for 30 pills and has not used them up.         04/04/2022    8:52 AM 01/23/2022   10:27 AM 10/30/2021    2:30 PM 08/06/2021   11:21 AM 12/25/2020    8:33 AM  Depression screen PHQ 2/9  Decreased Interest 0 0 0 1 0  Down, Depressed, Hopeless 0 0 0 1 0  PHQ - 2 Score 0 0 0 2 0  Altered sleeping 0   0 0  Tired, decreased energy 0   2 2  Change in appetite 0   0 0  Feeling bad or failure about yourself  0   1 0  Trouble concentrating 0   1 0  Moving slowly or fidgety/restless 0   0 1  Suicidal thoughts 0   0 0  PHQ-9 Score 0   6 3  Difficult doing work/chores Not difficult at all   Somewhat difficult Somewhat difficult         02/06/2021    8:00 AM 06/25/2021   10:28 AM 10/30/2021    2:29 PM 01/23/2022   10:31 AM 04/04/2022    8:53 AM  Fall Risk  Falls in the past year?   0 0 0  Was there an injury with Fall?   0 0 0  Fall Risk Category Calculator   0 0 0  Fall Risk Category (Retired)   Low Low   (RETIRED) Patient Fall Risk Level High fall risk Low fall risk Low fall risk Low fall risk   Patient at Risk for Falls Due to   No Fall Risks No Fall Risks No Fall Risks  Fall risk Follow up   Falls  evaluation completed;Education provided Falls evaluation completed;Education provided Falls evaluation completed      Review of Systems  Constitutional:  Negative for appetite change, fatigue and fever.  HENT:  Negative for congestion, ear pain, sinus pressure and sore throat.   Respiratory:  Negative for cough, chest tightness, shortness of breath and wheezing.   Cardiovascular:  Negative for chest pain and palpitations.  Gastrointestinal:  Negative for abdominal pain, constipation, diarrhea, nausea and vomiting.  Genitourinary:  Negative for dysuria and hematuria.  Musculoskeletal:  Negative for arthralgias, back pain, joint swelling and myalgias.  Skin:  Negative for rash.  Neurological:  Negative for dizziness, weakness and headaches.  Psychiatric/Behavioral:  Negative for dysphoric mood. The patient is not nervous/anxious.     Current Outpatient Medications on File Prior to Visit  Medication Sig Dispense Refill   cetirizine (ZYRTEC) 10 MG tablet Take 10 mg by mouth daily.     ENTRESTO 24-26 MG TAKE ONE TABLET BY  MOUTH EVERY MORNING and TAKE ONE TABLET BY MOUTH EVERYDAY AT BEDTIME 180 tablet 3   ezetimibe (ZETIA) 10 MG tablet TAKE ONE TABLET BY MOUTH EVERYDAY AT BEDTIME 90 tablet 3   FLUoxetine (PROZAC) 40 MG capsule TAKE ONE CAPSULE BY MOUTH ONCE DAILY 90 capsule 0   fluticasone (FLONASE) 50 MCG/ACT nasal spray USE TWO SPRAYS IN EACH NOSTRIL ONCE DAILY 48 g 3   Glucosamine-Chondroitin-MSM 1500-1200-500 MG PACK Take 1 tablet by mouth daily.     ibuprofen (ADVIL) 200 MG tablet Take 200 mg by mouth every 8 (eight) hours as needed for moderate pain.     LORazepam (ATIVAN) 0.5 MG tablet Take 1 tablet (0.5 mg total) by mouth at bedtime as needed for anxiety. 30 tablet 2   Multiple Vitamin (MULTIVITAMIN) capsule Take 1 capsule by mouth daily.     Omega-3 Fatty Acids (FISH OIL) 1000 MG CAPS Take 1,000 mg by mouth 2 (two) times daily.     omeprazole (PRILOSEC) 20 MG capsule TAKE ONE CAPSULE  BY MOUTH ONCE DAILY 90 capsule 0   rosuvastatin (CRESTOR) 40 MG tablet TAKE ONE TABLET BY MOUTH EVERYDAY AT BEDTIME 90 tablet 3   Vitamin D, Cholecalciferol, 25 MCG (1000 UT) CAPS Take 1,000 Units by mouth daily.     Melatonin 10 MG CAPS Take 5 mg by mouth at bedtime. (Patient not taking: Reported on 03/06/2022)     No current facility-administered medications on file prior to visit.   Past Medical History:  Diagnosis Date   Abnormal nuclear stress test 08/08/2020   Abnormal nuclear stress test 08/08/2020   Anxiety    Closed fracture of distal end of left radius 08/16/2020   Depressed left ventricular ejection fraction    Depression    Diabetes mellitus without complication ([place])    Dilated cardiomyopathy ([place]) 08/08/2020   Encounter for Medicare annual wellness exam 10/19/2019   Gastroesophageal reflux disease 10/19/2019   GERD (gastroesophageal reflux disease)    Hydropneumothorax 02/08/2021   Hyperlipidemia    Hyperlipidemia associated with type 2 diabetes mellitus ([place]) 10/19/2019   LBBB (left bundle branch block) 08/17/2020   Mitral valve prolapse    Nonischemic cardiomyopathy ([place]) 08/17/2020   Osteopenia 10/19/2019   Sinusitis 08/16/2019   Upper respiratory tract infection due to COVID-19 virus 03/18/2021   Past Surgical History:  Procedure Laterality Date   BIV ICD INSERTION CRT-D N/A 02/05/2021   Procedure: BIV ICD INSERTION CRT-D;  Surgeon: [name], MD;  Location: [facility];  Service: Cardiovascular;  Laterality: N/A;   BREAST LUMPECTOMY Left 2010   CATARACT EXTRACTION BILATERAL W/ ANTERIOR VITRECTOMY  2021   LEFT HEART CATH AND CORONARY ANGIOGRAPHY N/A 08/08/2020   Procedure: LEFT HEART CATH AND CORONARY ANGIOGRAPHY;  Surgeon: Leonie Man, MD;  Location: [facility];  Service: Cardiovascular;  Laterality: N/A;    Family History  Problem Relation Age of Onset   Osteoporosis Mother    Heart disease Father    Hyperlipidemia Father     Coronary artery disease Father    Diabetes Sister    Breast cancer Daughter    Diabetes Maternal Grandfather    Social History   Socioeconomic History   Marital status: Married    Spouse name: [name]   Number of children: 2   Years of education: Not on file   Highest education level: Master's degree (e.g., MA, MS, MEng, MEd, MSW, MBA)  Occupational History   Occupation: Retired  Tobacco Use  Smoking status: Never   Smokeless tobacco: Never  Vaping Use   Vaping Use: Never used  Substance and Sexual Activity   Alcohol use: Yes    Alcohol/week: 2.0 standard drinks of alcohol    Types: 2 Glasses of wine per week    Comment: occasional (white wine)   Drug use: Never   Sexual activity: Not Currently  Other Topics Concern   Not on file  Social History Narrative   Son lives in [place], Daughter lives in [place]   Patient 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 mother and father as well as two aunts   Social Determinants of Health   Financial Resource Strain: Medium Risk (01/23/2022)   Overall Financial Resource Strain (CARDIA)    Difficulty of Paying Living Expenses: Somewhat hard  Food Insecurity: No Food Insecurity (01/23/2022)   Hunger Vital Sign    Worried About Running Out of Food in the Last Year: Never true    [place] in the Last Year: Never true  Transportation Needs: No Transportation Needs (01/23/2022)   PRAPARE - Hydrologist (Medical): No    Lack of Transportation (Non-Medical): No  Physical Activity: Sufficiently Active (01/23/2022)   Exercise Vital Sign    Days of Exercise per Week: 6 days    Minutes of Exercise per Session: 30 min  Stress: Stress Concern Present (01/23/2022)   [place]    Feeling of Stress : To some extent  Social Connections: Unknown (01/23/2022)   Social Connection and Isolation Panel [NHANES]    Frequency of Communication with Friends and Family:  More than three times a week    Frequency of Social Gatherings with Friends and Family: More than three times a week    Attends Religious Services: Not on Advertising copywriter or Organizations: Not on file    Attends Music therapist: Not on file    Marital Status: Married    Objective:  BP 102/68 (BP Location: Left Arm, Patient Position: Sitting)   Pulse 62   Temp 97.6 F (36.4 C) (Temporal)   Ht 5' 6.5" (1.689 m)   Wt 134 lb (60.8 kg)   SpO2 100%   BMI 21.30 kg/m      04/04/2022    8:54 AM 04/02/2022   11:39 AM 03/20/2022    1:13 PM  BP/Weight  Systolic BP A999333 [id number]  Diastolic BP 68 72 68  Wt. (Lbs) 134 138.6 135  BMI 21.3 kg/m2 22.04 kg/m2 21.46 kg/m2    Physical Exam Vitals reviewed.  Constitutional:      Appearance: Normal appearance. She is normal weight.  Neck:     Vascular: No carotid bruit.  Cardiovascular:     Rate and Rhythm: Normal rate and regular rhythm.     Heart sounds: Normal heart sounds.  Pulmonary:     Effort: Pulmonary effort is normal.     Breath sounds: Normal breath sounds.  Abdominal:     General: Abdomen is flat. Bowel sounds are normal.     Palpations: Abdomen is soft.     Tenderness: There is no abdominal tenderness.  Neurological:     Mental Status: She is alert and oriented to person, place, and time.  Psychiatric:        Mood and Affect: Mood normal.        Behavior: Behavior normal.     Diabetic Foot Exam -  Simple   No data filed      Lab Results  Component Value Date   WBC 4.3 04/02/2022   HGB 11.6 (A) 04/02/2022   HCT 35 (A) 04/02/2022   PLT 289 04/02/2022   GLUCOSE 91 04/02/2022   CHOL 137 04/04/2022   TRIG 84 04/04/2022   HDL 61 04/04/2022   LDLCALC 60 04/04/2022   ALT 25 04/02/2022   AST 29 04/02/2022   NA 135 04/02/2022   K 3.9 04/02/2022   CL 102 04/02/2022   CREATININE 0.73 04/02/2022   BUN 13 04/02/2022   CO2 25 04/02/2022   TSH 4.845 (H) 04/02/2022   HGBA1C 6.1 (H)  04/04/2022      Assessment & Plan:    Natosha was seen today for hyperlipidemia and congestive heart failure.  Chronic systolic congestive heart failure (HCC) Overview: Nonischemic cardiomyopathy due to adriamycin use.  2.   05/2021:Echo: EF nearly normal. 50-55%. No valvular issues.  3.  01/2021: BiV ICD.  4.   07/2020 [facility] SUMMARY Angiographically minimal CAD with short subepicardial portion of the proximal LAD that is relatively diminutive, but widely patent.  Very tortuous vessels. Normal to low LVEDP. Nonischemic Cardiomyopathy: question if this is related to Left Bundle Branch Block with False Positive Stress Test related to Left Bundle Branch Block Wall Motion.  Assessment & Plan: Management per specialist. The current medical regimen is effective;  continue present plan and medications. Taking Entresto 24-26 mg daily.   Orders: -     Cardiovascular Risk Assessment  Gastroesophageal reflux disease without esophagitis Assessment & Plan: The current medical regimen is effective;  continue present plan and medications. Taking Omeprazole 20 mg daily   Mixed hyperlipidemia Assessment & Plan: Well controlled.  No changes to medicines. Zetia 10 mg daily, rosuvastatin 40 mg daily, fish oil 1000 mg BID. Continue to work on eating a healthy diet and exercise.  Labs drawn today.   Orders: -     Lipid panel  Chronic cough Assessment & Plan: Due to pneumonia. Has completed antibiotics.  Ordered Chest Xray, patient instructed to get the Xray done next week to conform resolution of pneumonia.  Refilled hydromet.   Orders: -     DG Chest 2 View  Other specified hypothyroidism Assessment & Plan: Labs done by Oncology showed that patient is Hypo. Start on Synthroid 25 mcg once daily.  Orders: -     Levothyroxine Sodium; Take 1 tablet (25 mcg total) by mouth daily.  Dispense: 90 tablet; Refill: 0  Prediabetes Assessment & Plan: Recommend continue to work on eating healthy  diet and exercise. No meds.  Orders: -     Hemoglobin A1c  Personal history of breast cancer Overview: Stage IIA (T3 N0 M0) hormone positive breast cancer, status post a lumpectomy in November 2010. This was followed by adjuvant chemotherapy and radiation.  She also took anastrozole for 6+ years for her adjuvant endocrine management.  Mammograms since then have shown no evidence of disease recurrence.   Assessment & Plan: Patient is 13 years out from diagnosis. No signs of recurrence.    Anemia, normocytic normochromic Assessment & Plan: No etiology identified.  Saw. Hematology 2 days ago.  Defer management to them.    Depression, major, recurrent, mild ([place]) Assessment & Plan: At goal. Recommend continue prozac.  Rx for lorazepam refilled for anxiety and occasional use for sleep.    ICD (implantable cardioverter-defibrillator), biventricular, in situ Assessment & Plan: Monitored by cardiology   Nonischemic cardiomyopathy (  [place]) Assessment & Plan: Secondary to adriamycin.    LBBB (left bundle branch block)      Meds ordered this encounter  Medications   levothyroxine (SYNTHROID) 25 MCG tablet    Sig: Take 1 tablet (25 mcg total) by mouth daily.    Dispense:  90 tablet    Refill:  0    Orders Placed This Encounter  Procedures   DG Chest 2 View   Hemoglobin A1c   Lipid panel   Cardiovascular Risk Assessment     Follow-up: Return in about 3 months (around 07/03/2022) for chronic fasting.   I,Audrey Ruiz,acting as a scribe for [name], MD.,have documented all relevant documentation on the behalf of [name], MD,as directed by  [name], MD while in the presence of [name], MD.   An After Visit Summary was printed and given to the patient.  I attest that I have reviewed this visit and agree with the plan scribed by my staff.   [name], MD [facility] [phone]

## 2022-04-03 NOTE — Assessment & Plan Note (Signed)
Management per specialist. The current medical regimen is effective;  continue present plan and medications. Taking Entresto 24-26 mg daily.

## 2022-04-03 NOTE — Assessment & Plan Note (Addendum)
Recommend continue to work on eating healthy diet and exercise. No meds.

## 2022-04-03 NOTE — Assessment & Plan Note (Signed)
The current medical regimen is effective;  continue present plan and medications. Taking Omeprazole 20 mg daily

## 2022-04-04 ENCOUNTER — Encounter: Payer: Self-pay | Admitting: Family Medicine

## 2022-04-04 ENCOUNTER — Ambulatory Visit (INDEPENDENT_AMBULATORY_CARE_PROVIDER_SITE_OTHER): Payer: PPO | Admitting: Family Medicine

## 2022-04-04 VITALS — BP 102/68 | HR 62 | Temp 97.6°F | Ht 66.5 in | Wt 134.0 lb

## 2022-04-04 DIAGNOSIS — I447 Left bundle-branch block, unspecified: Secondary | ICD-10-CM

## 2022-04-04 DIAGNOSIS — E038 Other specified hypothyroidism: Secondary | ICD-10-CM

## 2022-04-04 DIAGNOSIS — Z95810 Presence of automatic (implantable) cardiac defibrillator: Secondary | ICD-10-CM | POA: Diagnosis not present

## 2022-04-04 DIAGNOSIS — D649 Anemia, unspecified: Secondary | ICD-10-CM

## 2022-04-04 DIAGNOSIS — F330 Major depressive disorder, recurrent, mild: Secondary | ICD-10-CM | POA: Diagnosis not present

## 2022-04-04 DIAGNOSIS — R053 Chronic cough: Secondary | ICD-10-CM

## 2022-04-04 DIAGNOSIS — I5022 Chronic systolic (congestive) heart failure: Secondary | ICD-10-CM

## 2022-04-04 DIAGNOSIS — Z853 Personal history of malignant neoplasm of breast: Secondary | ICD-10-CM

## 2022-04-04 DIAGNOSIS — R7303 Prediabetes: Secondary | ICD-10-CM

## 2022-04-04 DIAGNOSIS — E782 Mixed hyperlipidemia: Secondary | ICD-10-CM | POA: Diagnosis not present

## 2022-04-04 DIAGNOSIS — K219 Gastro-esophageal reflux disease without esophagitis: Secondary | ICD-10-CM | POA: Diagnosis not present

## 2022-04-04 DIAGNOSIS — E1169 Type 2 diabetes mellitus with other specified complication: Secondary | ICD-10-CM | POA: Diagnosis not present

## 2022-04-04 DIAGNOSIS — I428 Other cardiomyopathies: Secondary | ICD-10-CM

## 2022-04-04 DIAGNOSIS — E785 Hyperlipidemia, unspecified: Secondary | ICD-10-CM | POA: Diagnosis not present

## 2022-04-04 MED ORDER — LEVOTHYROXINE SODIUM 25 MCG PO TABS: 25.0000 ug | ORAL_TABLET | Freq: Every day | ORAL | 0 refills | Status: DC

## 2022-04-04 NOTE — Patient Instructions (Signed)
Start levothyroxine 25 mcg once daily in am.

## 2022-04-04 NOTE — Assessment & Plan Note (Addendum)
Due to pneumonia. Has completed antibiotics.  Ordered Chest Xray, patient instructed to get the Xray done next week to conform resolution of pneumonia.  Refilled hydromet.

## 2022-04-04 NOTE — Assessment & Plan Note (Signed)
Labs done by Oncology showed that patient is Hypo. Start on Synthroid 25 mcg once daily.

## 2022-04-05 ENCOUNTER — Other Ambulatory Visit: Payer: Self-pay | Admitting: Family Medicine

## 2022-04-05 LAB — HEMOGLOBIN A1C
Est. average glucose Bld gHb Est-mCnc: 128 mg/dL
Hgb A1c MFr Bld: 6.1 % — ABNORMAL HIGH (ref 4.8–5.6)

## 2022-04-05 LAB — PROTEIN ELECTROPHORESIS, SERUM
A/G Ratio: 1.3 (ref 0.7–1.7)
Albumin ELP: 3.6 g/dL (ref 2.9–4.4)
Alpha-1-Globulin: 0.3 g/dL (ref 0.0–0.4)
Alpha-2-Globulin: 0.8 g/dL (ref 0.4–1.0)
Beta Globulin: 0.9 g/dL (ref 0.7–1.3)
Gamma Globulin: 0.7 g/dL (ref 0.4–1.8)
Globulin, Total: 2.7 g/dL (ref 2.2–3.9)
Total Protein ELP: 6.3 g/dL (ref 6.0–8.5)

## 2022-04-05 LAB — LIPID PANEL
Chol/HDL Ratio: 2.2 ratio (ref 0.0–4.4)
Cholesterol, Total: 137 mg/dL (ref 100–199)
HDL: 61 mg/dL (ref >39)
LDL Chol Calc (NIH): 60 mg/dL (ref 0–99)
Triglycerides: 84 mg/dL (ref 0–149)
VLDL Cholesterol Cal: 16 mg/dL (ref 5–40)

## 2022-04-05 LAB — CARDIOVASCULAR RISK ASSESSMENT

## 2022-04-05 MED ORDER — HYDROCODONE BIT-HOMATROP MBR 5-1.5 MG/5ML PO SOLN: 5.0000 mL | Freq: Two times a day (BID) | ORAL | 0 refills | Status: DC | PRN

## 2022-04-07 DIAGNOSIS — Z853 Personal history of malignant neoplasm of breast: Secondary | ICD-10-CM | POA: Insufficient documentation

## 2022-04-07 NOTE — Assessment & Plan Note (Signed)
Secondary to adriamycin.

## 2022-04-07 NOTE — Assessment & Plan Note (Signed)
Monitored by cardiology

## 2022-04-07 NOTE — Assessment & Plan Note (Signed)
At goal. Recommend continue prozac.  Rx for lorazepam refilled for anxiety and occasional use for sleep.

## 2022-04-07 NOTE — Assessment & Plan Note (Signed)
Patient is 13 years out from diagnosis. No signs of recurrence.

## 2022-04-07 NOTE — Progress Notes (Signed)
Cholesterol: Great HBA1C: 6.1.  Worsened from 5.5.  Patient has been sick which could contribute.  Recommend work on eating healthy diet and exercise.

## 2022-04-07 NOTE — Assessment & Plan Note (Signed)
No etiology identified.  Saw. Hematology 2 days ago.  Defer management to them.

## 2022-04-09 DIAGNOSIS — R053 Chronic cough: Secondary | ICD-10-CM | POA: Diagnosis not present

## 2022-04-10 ENCOUNTER — Ambulatory Visit (INDEPENDENT_AMBULATORY_CARE_PROVIDER_SITE_OTHER): Payer: PPO

## 2022-04-10 ENCOUNTER — Telehealth: Payer: HMO

## 2022-04-10 DIAGNOSIS — E782 Mixed hyperlipidemia: Secondary | ICD-10-CM

## 2022-04-10 DIAGNOSIS — I5022 Chronic systolic (congestive) heart failure: Secondary | ICD-10-CM

## 2022-04-10 NOTE — Patient Instructions (Addendum)
Please call the care guide team at [phone] if you need to cancel or reschedule your appointment.   If you are experiencing a Mental Health or [place] or need someone to talk to, please call the Suicide and Crisis Lifeline: 988 call the [place] National Suicide Prevention Lifeline: 579-702-7898 or TTY: (773)012-1102 TTY 440-284-4227) to talk to a trained counselor call 1-800-273-TALK (toll free, 24 hour hotline)   Following is a copy of the CCM Program Consent:  CCM service includes personalized support from designated clinical staff supervised by the physician, including individualized plan of care and coordination with other care providers 24/7 contact phone numbers for assistance for urgent and routine care needs. Service will only be billed when office clinical staff spend 20 minutes or more in a month to coordinate care. Only one practitioner may furnish and bill the service in a calendar month. The patient may stop CCM services at amy time (effective at the end of the month) by phone call to the office staff. The patient will be responsible for cost sharing (co-pay) or up to 20% of the service fee (after annual deductible is met)  Following is a copy of your full provider care plan:   Goals Addressed             This Visit's Progress    CCM Expected Outcome:  Monitor, Self-Manage and Reduce Symptoms of Heart Failure       Current Barriers:  Chronic Disease Management support and education needs related to effective management of HF  Planned Interventions: Basic overview and discussion of pathophysiology of Heart Failure reviewed. The patient has a good understanding of her cardiac conditions and follows up as directed. Blood pressures are stable. Review of when to call the provider for changes. Also encouraged the patient to write down questions to ask the providers when she has appointments.  Provided education on low sodium diet. States her appetite is good,  denies any issues with heart healthy diet, monitors sugar content as well Reviewed Heart Failure Action Plan in depth and provided written copy Assessed need for readable accurate scales in home Provided education about placing scale on hard, flat surface Advised patient to weigh each morning after emptying bladder Discussed importance of daily weight and advised patient to weigh and record daily Reviewed role of diuretics in prevention of fluid overload and management of heart failure Medication management: The patient takes Entresto 24/26 mg as directed. Denies any issues with medication cost or obtaining medications. Will continue to monitor for new needs or concerns.  Discussed the importance of keeping all appointments with provider. The patient is compliant with appointments. Saw the pcp on 04-04-2022 and will see again on 07-08-2022 at 2 pm Provided patient with education about the role of exercise in the management of heart failure Advised patient to discuss changes in HF, questions or concerns about heart health with provider Screening for signs and symptoms of depression related to chronic disease state  Assessed social determinant of health barriers EF 50-55% 06-06-2021 Pacemaker placement in early 2023  Symptom Management: Take medications as prescribed   Attend all scheduled provider appointments Call provider office for new concerns or questions  call the Suicide and Crisis Lifeline: 988 call the [place] National Suicide Prevention Lifeline: 564-209-1348 or TTY: 941-391-1641 TTY 781-144-6194) to talk to a trained counselor call 1-800-273-TALK (toll free, 24 hour hotline) if experiencing a Mental Health or [place]  call office if I gain more than 2 pounds in  one day or 5 pounds in one week use salt in moderation watch for swelling in feet, ankles and legs every day weigh myself daily develop a rescue plan follow rescue plan if symptoms flare-up track symptoms  and what helps feel better or worse dress right for the weather, hot or cold  Follow Up Plan: Telephone follow up appointment with care management team member scheduled for: 06-12-2022 at 0945 am       CCM Expected Outcome:  Monitor, Self-Manage and Reduce Symptoms of:HLD       Current Barriers:  Chronic Disease Management support and education needs related to HLD Caregiver stress: The patient assist and cares for her elderly parents and several others Lab Results  Component Value Date   CHOL 137 04/04/2022   HDL 61 04/04/2022   LDLCALC 60 04/04/2022   TRIG 84 04/04/2022   CHOLHDL 2.2 04/04/2022     Planned Interventions: Provider established cholesterol goals reviewed. Review of most recent lab work. The patient is at goal. Did discuss her Thyroid level also and it being out of range. The patient has started on Synthroid and will have follow up in 4 weeks. She has only taken 2 doses of Synthroid; Counseled on importance of regular laboratory monitoring as prescribed. Review and education provided; Provided HLD educational materials; Reviewed role and benefits of statin for ASCVD risk reduction; Discussed strategies to manage statin-induced myalgias. The patient takes Zetia 10 mg daily, Crestor 40 mg at night, and Fish Oil 1000 mg BID. She denies any issues with current medications regimen. ; Reviewed importance of limiting foods high in cholesterol. Review of heart healthy/ADA diet. ; Reviewed exercise goals and target of 150 minutes per week; Screening for signs and symptoms of depression related to chronic disease state;  Assessed social determinant of health barriers;  Discussed resources available to help with needs as they arise. Discussed self care and making sure she carves out time for self care and her own needs. The patient assist with the care of her elderly parents, mother in law, and husband. Also helps with the care of elderly aunt and recently moving an aunt to the  [place] area. Discussed making sure she is monitoring her stress level and health and well being. The patient recently started Levothyroxine 25 mcg for an elevated Thyroid level of 4.845 on 04-02-2022. The patient is concerned about the lingering cough and clearing of throat that she is having. The patient ask oncology about additional testing of her Thyroid but she feels it was not addressed fully. Did ask the RNCM to ask pcp for recommendation. Secure message sent to the pcp asking for recommendation on additional testing to check her thyroid. Extensive education on how Levothyroxine replaces the hormone thyroxine in the body and helps the thyroid work normally. Education also on regular lab work and getting medications to a therapeutic level. Will collaborate with the pcp for recommendations and follow up with the pcp according. Will send educational information in myChart for the patient.   Symptom Management: Take medications as prescribed   Attend all scheduled provider appointments Call provider office for new concerns or questions  call the Suicide and Crisis Lifeline: 988 call the [place] National Suicide Prevention Lifeline: 726-694-5852 or TTY: 7167064027 TTY 808-793-5134) to talk to a trained counselor call 1-800-273-TALK (toll free, 24 hour hotline) if experiencing a Mental Health or [place]  - call for medicine refill 2 or 3 days before it runs out - take all medications exactly  as prescribed - call doctor with any symptoms you believe are related to your medicine - call doctor when you experience any new symptoms - go to all doctor appointments as scheduled - adhere to prescribed diet: heart healthy diet  Follow Up Plan: Telephone follow up appointment with care management team member scheduled for: 06-12-2022 at 0945 am          Patient verbalizes understanding of instructions and care plan provided today and agrees to view in [place]. Active MyChart status  and patient understanding of how to access instructions and care plan via MyChart confirmed with patient.     Telephone follow up appointment with care management team member scheduled for: 06-12-2022 at 0945 am  Levothyroxine Capsules What is this medication? LEVOTHYROXINE (lee voe thye ROX een) treats low thyroid levels (hypothyroidism) in your body. It works by replacing a thyroid hormone normally made by the body. Thyroid hormones play an important role in your overall health. They help support metabolism and energy levels. This medicine may be used for other purposes; ask your health care provider or pharmacist if you have questions. COMMON BRAND NAME(S): TIROSINT What should I tell my care team before I take this medication? They need to know if you have any of these conditions: Addison's disease or other adrenal gland problem Angina Bone problems Dieting or on a weight loss program Fertility problems Heart disease High blood sugar (diabetes) Pituitary gland problem Take medications that treat or prevent blood clots An unusual or allergic reaction to levothyroxine, thyroid hormones, glycerin, glycerol, other medications, foods, dyes, or preservatives Pregnant or trying to get pregnant Breast-feeding How should I use this medication? Take this medication by mouth with a glass of water. It is best to take on an empty stomach, at least 30 minutes to one hour before breakfast. Avoid taking antacids containing aluminum or magnesium, simethicone, bile acid sequestrants, calcium carbonate, sodium polystyrene sulfonate, ferrous sulfate, sevelamer, lanthanum, or sucralfate within 4 hours of taking this medication. Do not cut, crush or chew this medication. Follow the directions on the prescription label. Take at the same time each day. Do not take your medication more often than directed. Talk to your care team regarding the use of this medication in children. While this medication may be  prescribed for selected conditions, precautions do apply. Since the capsules cannot be crushed or placed in water, they may only be given to infants and children who are able to swallow an intact capsule. Overdosage: If you think you have taken too much of this medicine contact a poison control center or emergency room at once. NOTE: This medicine is only for you. Do not share this medicine with others. What if I miss a dose? If you miss a dose, take it as soon as you can. If it is almost time for your next dose, take only that dose. Do not take double or extra doses. What may interact with this medication? Amiodarone Antacids Calcium supplements Carbamazepine Certain medications for depression Certain medications to treat cancer Cholestyramine Clofibrate Colesevelam Colestipol Digoxin Female hormones, like estrogens and birth control pills, patches, rings, or injections Glyoxylate Iron supplements Ketamine Lanthanum Liquid nutrition products like Ensure Lithium Medications for colds and breathing difficulties Medications for diabetes Medications for hyperthyroidism Medications or dietary supplements for weight loss Methadone Niacin Orlistat Oxandrolone Phenobarbital or other barbiturates Phenytoin Rifampin Sevelamer Simethicone Sodium polystyrene sulfonate Soy isoflavones Steroid medications like prednisone or cortisone Sucralfate Testosterone Theophylline Warfarin This list may not describe all  possible interactions. Give your health care provider a list of all the medicines, herbs, non-prescription drugs, or dietary supplements you use. Also tell them if you smoke, drink alcohol, or use illegal drugs. Some items may interact with your medicine. What should I watch for while using this medication? Do not switch brands of this medication unless your care team agrees with the change. Ask questions if you are uncertain. You will need regular exams and occasional blood  tests to check the response to treatment. If you are receiving this medication for an underactive thyroid, it may be several weeks before you notice an improvement. Check with your care team if your symptoms do not improve. It may be necessary for you to take this medication for the rest of your life. Do not stop using this medication unless your care team advises you to. This medication can affect blood sugar levels. If you have diabetes, check your blood sugar as directed. You may lose some of your hair when you first start treatment. With time, this usually corrects itself. If you are going to have surgery, tell your care team that you are taking this medication. What side effects may I notice from receiving this medication? Side effects that you should report to your care team as soon as possible: Allergic reactions--skin rash, itching, hives, swelling of the face, lips, tongue, or throat Anxiety, nervousness Excessive sweating or sensitivity to heat Fever Heart palpitations--rapid, pounding, or irregular heartbeat Heart rhythm changes--fast or irregular heartbeat, dizziness, feeling faint or lightheaded, chest pain, trouble breathing Irregular menstrual cycles or spotting Severe diarrhea Tremors or shaking Trouble sleeping Side effects that usually do not require medical attention (report to your care team if they continue or are bothersome): Changes in appetite Hair loss Headache Nausea Vomiting This list may not describe all possible side effects. Call your doctor for medical advice about side effects. You may report side effects to FDA at 1-800-FDA-1088. Where should I keep my medication? Keep out of the reach of children and pets. Store at room temperature between 15 and 30 degrees C (59 and 86 degrees F). Protect from light and moisture. Keep container tightly closed. Throw away any unused medication after the expiration date. NOTE: This sheet is a summary. It may not cover all  possible information. If you have questions about this medicine, talk to your doctor, pharmacist, or health care provider.  2023 Elsevier/Gold Standard (2020-04-19 00:00:00) Hypothyroidism  Hypothyroidism is when the thyroid gland does not make enough of certain hormones. This is called an underactive thyroid. The thyroid gland is a small gland located in the lower front part of the neck, just in front of the windpipe (trachea). This gland makes hormones that help control how the body uses food for energy (metabolism) as well as how the heart and brain function. These hormones also play a role in keeping your bones strong. When the thyroid is underactive, it produces too little of the hormones thyroxine (T4) and triiodothyronine (T3). What are the causes? This condition may be caused by: Hashimoto's disease. This is a disease in which the body's disease-fighting system (immune system) attacks the thyroid gland. This is the most common cause. Viral infections. Pregnancy. Certain medicines. Birth defects. Problems with a gland in the center of the brain (pituitary gland). Lack of enough iodine in the diet. Other causes may include: Past radiation treatments to the head or neck for cancer. Past treatment with radioactive iodine. Past exposure to radiation in the environment. Past surgical  removal of part or all of the thyroid. What increases the risk? You are more likely to develop this condition if: You are female. You have a family history of thyroid conditions. You use a medicine called lithium. You take medicines that affect the immune system (immunosuppressants). What are the signs or symptoms? Common symptoms of this condition include: Not being able to tolerate cold. Feeling as though you have no energy (lethargy). Lack of appetite. Constipation. Sadness or depression. Weight gain that is not explained by a change in diet or exercise habits. Menstrual irregularity. Dry skin,  coarse hair, or brittle nails. Other symptoms may include: Muscle pain. Slowing of thought processes. Poor memory. How is this diagnosed? This condition may be diagnosed based on: Your symptoms, your medical history, and a physical exam. Blood tests. You may also have imaging tests, such as an ultrasound or MRI. How is this treated? This condition is treated with medicine that replaces the thyroid hormones that your body does not make. After you begin treatment, it may take several weeks for symptoms to go away. Follow these instructions at home: Take over-the-counter and prescription medicines only as told by your health care provider. If you start taking any new medicines, tell your health care provider. Keep all follow-up visits as told by your health care provider. This is important. As your condition improves, your dosage of thyroid hormone medicine may change. You will need to have blood tests regularly so that your health care provider can monitor your condition. Contact a health care provider if: Your symptoms do not get better with treatment. You are taking thyroid hormone replacement medicine and you: Sweat a lot. Have tremors. Feel anxious. Lose weight rapidly. Cannot tolerate heat. Have emotional swings. Have diarrhea. Feel weak. Get help right away if: You have chest pain. You have an irregular heartbeat. You have a rapid heartbeat. You have difficulty breathing. These symptoms may be an emergency. Get help right away. Call 911. Do not wait to see if the symptoms will go away. Do not drive yourself to the hospital. Summary Hypothyroidism is when the thyroid gland does not make enough of certain hormones (it is underactive). When the thyroid is underactive, it produces too little of the hormones thyroxine (T4) and triiodothyronine (T3). The most common cause is Hashimoto's disease, a disease in which the body's disease-fighting system (immune system) attacks the  thyroid gland. The condition can also be caused by viral infections, medicine, pregnancy, or past radiation treatment to the head or neck. Symptoms may include weight gain, dry skin, constipation, feeling as though you do not have energy, and not being able to tolerate cold. This condition is treated with medicine to replace the thyroid hormones that your body does not make. This information is not intended to replace advice given to you by your health care provider. Make sure you discuss any questions you have with your health care provider. Document Revised: 02/06/2021 Document Reviewed: 02/06/2021 Elsevier Patient Education  [place].

## 2022-04-10 NOTE — Chronic Care Management (AMB) (Signed)
Chronic Care Management   CCM RN Visit Note  04/10/2022 Name: Adreona Devary MRN: [id number] DOB: November 17, 1950  Subjective: [name] Amelyah Bargerstock is a 72 y.o. year old female who is a primary care patient of Cox, Kirsten, MD. The patient was referred to the Chronic Care Management team for assistance with care management needs subsequent to provider initiation of CCM services and plan of care.    Today's Visit:  Engaged with patient by telephone for follow up visit.        Goals Addressed             This Visit's Progress    CCM Expected Outcome:  Monitor, Self-Manage and Reduce Symptoms of Heart Failure       Current Barriers:  Chronic Disease Management support and education needs related to effective management of HF  Planned Interventions: Basic overview and discussion of pathophysiology of Heart Failure reviewed. The patient has a good understanding of her cardiac conditions and follows up as directed. Blood pressures are stable. Review of when to call the provider for changes. Also encouraged the patient to write down questions to ask the providers when she has appointments.  Provided education on low sodium diet. States her appetite is good, denies any issues with heart healthy diet, monitors sugar content as well Reviewed Heart Failure Action Plan in depth and provided written copy Assessed need for readable accurate scales in home Provided education about placing scale on hard, flat surface Advised patient to weigh each morning after emptying bladder Discussed importance of daily weight and advised patient to weigh and record daily Reviewed role of diuretics in prevention of fluid overload and management of heart failure Medication management: The patient takes Entresto 24/26 mg as directed. Denies any issues with medication cost or obtaining medications. Will continue to monitor for new needs or concerns.  Discussed the importance of keeping all appointments  with provider. The patient is compliant with appointments. Saw the pcp on 04-04-2022 and will see again on 07-08-2022 at 2 pm Provided patient with education about the role of exercise in the management of heart failure Advised patient to discuss changes in HF, questions or concerns about heart health with provider Screening for signs and symptoms of depression related to chronic disease state  Assessed social determinant of health barriers EF 50-55% 06-06-2021 Pacemaker placement in early 2023  Symptom Management: Take medications as prescribed   Attend all scheduled provider appointments Call provider office for new concerns or questions  call the Suicide and Crisis Lifeline: 988 call the [place] National Suicide Prevention Lifeline: (336)627-0406 or TTY: 5705784917 TTY 639-574-4349) to talk to a trained counselor call 1-800-273-TALK (toll free, 24 hour hotline) if experiencing a Mental Health or [place]  call office if I gain more than 2 pounds in one day or 5 pounds in one week use salt in moderation watch for swelling in feet, ankles and legs every day weigh myself daily develop a rescue plan follow rescue plan if symptoms flare-up track symptoms and what helps feel better or worse dress right for the weather, hot or cold  Follow Up Plan: Telephone follow up appointment with care management team member scheduled for: 06-12-2022 at 0945 am       CCM Expected Outcome:  Monitor, Self-Manage and Reduce Symptoms of:HLD       Current Barriers:  Chronic Disease Management support and education needs related to HLD Caregiver stress: The patient assist and cares for her elderly parents  and several others Lab Results  Component Value Date   CHOL 137 04/04/2022   HDL 61 04/04/2022   LDLCALC 60 04/04/2022   TRIG 84 04/04/2022   CHOLHDL 2.2 04/04/2022     Planned Interventions: Provider established cholesterol goals reviewed. Review of most recent lab work. The patient  is at goal. Did discuss her Thyroid level also and it being out of range. The patient has started on Synthroid and will have follow up in 4 weeks. She has only taken 2 doses of Synthroid; Counseled on importance of regular laboratory monitoring as prescribed. Review and education provided; Provided HLD educational materials; Reviewed role and benefits of statin for ASCVD risk reduction; Discussed strategies to manage statin-induced myalgias. The patient takes Zetia 10 mg daily, Crestor 40 mg at night, and Fish Oil 1000 mg BID. She denies any issues with current medications regimen. ; Reviewed importance of limiting foods high in cholesterol. Review of heart healthy/ADA diet. ; Reviewed exercise goals and target of 150 minutes per week; Screening for signs and symptoms of depression related to chronic disease state;  Assessed social determinant of health barriers;  Discussed resources available to help with needs as they arise. Discussed self care and making sure she carves out time for self care and her own needs. The patient assist with the care of her elderly parents, mother in law, and husband. Also helps with the care of elderly aunt and recently moving an aunt to the [place] area. Discussed making sure she is monitoring her stress level and health and well being. The patient recently started Levothyroxine 25 mcg for an elevated Thyroid level of 4.845 on 04-02-2022. The patient is concerned about the lingering cough and clearing of throat that she is having. The patient ask oncology about additional testing of her Thyroid but she feels it was not addressed fully. Did ask the RNCM to ask pcp for recommendation. Secure message sent to the pcp asking for recommendation on additional testing to check her thyroid. Extensive education on how Levothyroxine replaces the hormone thyroxine in the body and helps the thyroid work normally. Education also on regular lab work and getting medications to a therapeutic  level. Will collaborate with the pcp for recommendations and follow up with the pcp according. Will send educational information in myChart for the patient.   Symptom Management: Take medications as prescribed   Attend all scheduled provider appointments Call provider office for new concerns or questions  call the Suicide and Crisis Lifeline: 988 call the [place] National Suicide Prevention Lifeline: 220-153-1690 or TTY: 646-307-6430 TTY 416-827-4837) to talk to a trained counselor call 1-800-273-TALK (toll free, 24 hour hotline) if experiencing a Mental Health or [place]  - call for medicine refill 2 or 3 days before it runs out - take all medications exactly as prescribed - call doctor with any symptoms you believe are related to your medicine - call doctor when you experience any new symptoms - go to all doctor appointments as scheduled - adhere to prescribed diet: heart healthy diet  Follow Up Plan: Telephone follow up appointment with care management team member scheduled for: 06-12-2022 at 0945 am          Plan:Telephone follow up appointment with care management team member scheduled for:  06-12-2022 at [place] am  Noreene Larsson RN, MSN, CCM RN Care Manager  Chronic Care Management Direct Number: [phone]

## 2022-04-11 ENCOUNTER — Other Ambulatory Visit: Payer: Self-pay | Admitting: Family Medicine

## 2022-04-12 ENCOUNTER — Ambulatory Visit: Payer: Self-pay

## 2022-04-12 DIAGNOSIS — I5022 Chronic systolic (congestive) heart failure: Secondary | ICD-10-CM

## 2022-04-12 DIAGNOSIS — E782 Mixed hyperlipidemia: Secondary | ICD-10-CM

## 2022-04-12 NOTE — Patient Instructions (Signed)
Please call the care guide team at [phone] if you need to cancel or reschedule your appointment.   If you are experiencing a Mental Health or [place] or need someone to talk to, please call the Suicide and Crisis Lifeline: 988 call the [place] National Suicide Prevention Lifeline: 475 382 9686 or TTY: 614-648-3370 TTY 951-232-9080) to talk to a trained counselor call 1-800-273-TALK (toll free, 24 hour hotline)   Following is a copy of the CCM Program Consent:  CCM service includes personalized support from designated clinical staff supervised by the physician, including individualized plan of care and coordination with other care providers 24/7 contact phone numbers for assistance for urgent and routine care needs. Service will only be billed when office clinical staff spend 20 minutes or more in a month to coordinate care. Only one practitioner may furnish and bill the service in a calendar month. The patient may stop CCM services at amy time (effective at the end of the month) by phone call to the office staff. The patient will be responsible for cost sharing (co-pay) or up to 20% of the service fee (after annual deductible is met)  Following is a copy of your full provider care plan:   Goals Addressed             This Visit's Progress    CCM Expected Outcome:  Monitor, Self-Manage and Reduce Symptoms of Heart Failure       Current Barriers:  Chronic Disease Management support and education needs related to effective management of HF  Planned Interventions: Basic overview and discussion of pathophysiology of Heart Failure reviewed. The patient has a good understanding of her cardiac conditions and follows up as directed. Blood pressures are stable. Review of when to call the provider for changes. Also encouraged the patient to write down questions to ask the providers when she has appointments.  Provided education on low sodium diet. States her appetite is good,  denies any issues with heart healthy diet, monitors sugar content as well Reviewed Heart Failure Action Plan in depth and provided written copy Assessed need for readable accurate scales in home Provided education about placing scale on hard, flat surface Advised patient to weigh each morning after emptying bladder Discussed importance of daily weight and advised patient to weigh and record daily Reviewed role of diuretics in prevention of fluid overload and management of heart failure Medication management: The patient takes Entresto 24/26 mg as directed. Denies any issues with medication cost or obtaining medications. Will continue to monitor for new needs or concerns.  Discussed the importance of keeping all appointments with provider. The patient is compliant with appointments. Saw the pcp on 04-04-2022 and will see again on 07-08-2022 at 2 pm. Will call the office to get a brief appointment to see the pcp concerning questions about her thyroid. Education and support provided.  Provided patient with education about the role of exercise in the management of heart failure Advised patient to discuss changes in HF, questions or concerns about heart health with provider Screening for signs and symptoms of depression related to chronic disease state  Assessed social determinant of health barriers EF 50-55% 06-06-2021 Pacemaker placement in early 2023  Symptom Management: Take medications as prescribed   Attend all scheduled provider appointments Call provider office for new concerns or questions  call the Suicide and Crisis Lifeline: 988 call the [place] National Suicide Prevention Lifeline: 361 140 8055 or TTY: (303)662-2912 TTY 617-713-8388) to talk to a trained counselor call 1-800-273-TALK (toll free,  24 hour hotline) if experiencing a Mental Health or [place]  call office if I gain more than 2 pounds in one day or 5 pounds in one week use salt in moderation watch for swelling  in feet, ankles and legs every day weigh myself daily develop a rescue plan follow rescue plan if symptoms flare-up track symptoms and what helps feel better or worse dress right for the weather, hot or cold  Follow Up Plan: Telephone follow up appointment with care management team member scheduled for: 06-12-2022 at 0945 am       CCM Expected Outcome:  Monitor, Self-Manage and Reduce Symptoms of:HLD       Current Barriers:  Chronic Disease Management support and education needs related to HLD Caregiver stress: The patient assist and cares for her elderly parents and several others Lab Results  Component Value Date   CHOL 137 04/04/2022   HDL 61 04/04/2022   LDLCALC 60 04/04/2022   TRIG 84 04/04/2022   CHOLHDL 2.2 04/04/2022     Planned Interventions: Provider established cholesterol goals reviewed. Review of most recent lab work. The patient is at goal. Did discuss her Thyroid level also and it being out of range. The patient has started on Synthroid and will have follow up in 4 weeks. She has only taken 2 doses of Synthroid; Counseled on importance of regular laboratory monitoring as prescribed. Review and education provided; Provided HLD educational materials; Reviewed role and benefits of statin for ASCVD risk reduction; Discussed strategies to manage statin-induced myalgias. The patient takes Zetia 10 mg daily, Crestor 40 mg at night, and Fish Oil 1000 mg BID. She denies any issues with current medications regimen. ; Reviewed importance of limiting foods high in cholesterol. Review of heart healthy/ADA diet. ; Reviewed exercise goals and target of 150 minutes per week; Screening for signs and symptoms of depression related to chronic disease state;  Assessed social determinant of health barriers;  Discussed resources available to help with needs as they arise. Discussed self care and making sure she carves out time for self care and her own needs. The patient assist with the care  of her elderly parents, mother in law, and husband. Also helps with the care of elderly aunt and recently moving an aunt to the [place] area. Discussed making sure she is monitoring her stress level and health and well being. The patient recently started Levothyroxine 25 mcg for an elevated Thyroid level of 4.845 on 04-02-2022. The patient is concerned about the lingering cough and clearing of throat that she is having. The patient ask oncology about additional testing of her Thyroid but she feels it was not addressed fully. Did ask the RNCM to ask pcp for recommendation. Secure message sent to the pcp asking for recommendation on additional testing to check her thyroid. Extensive education on how Levothyroxine replaces the hormone thyroxine in the body and helps the thyroid work normally. Education also on regular lab work and getting medications to a therapeutic level. Will collaborate with the pcp for recommendations and follow up with the pcp according. Will send educational information in myChart for the patient. Call made back to the patient today and gave her recommendations from Dr. Tobie Poet. Dr. Tobie Poet would like the patient to come in for a brief appointment to evaluate her tyroid and if it is enlarged she would like to order an ultrasound if it is warranted. The patient given instructions and will call the office to set up an appointment to  see Dr. Tobie Poet for evaluation and recommendations. The patient is thankful for the support and states she will call the office and secure an appointment to be seen.    Symptom Management: Take medications as prescribed   Attend all scheduled provider appointments Call provider office for new concerns or questions  call the Suicide and Crisis Lifeline: 988 call the [place] National Suicide Prevention Lifeline: (508)788-7686 or TTY: (548) 516-6062 TTY 9385368129) to talk to a trained counselor call 1-800-273-TALK (toll free, 24 hour hotline) if experiencing a Mental Health  or [place]  - call for medicine refill 2 or 3 days before it runs out - take all medications exactly as prescribed - call doctor with any symptoms you believe are related to your medicine - call doctor when you experience any new symptoms - go to all doctor appointments as scheduled - adhere to prescribed diet: heart healthy diet  Follow Up Plan: Telephone follow up appointment with care management team member scheduled for: 06-12-2022 at 0945 am          Patient verbalizes understanding of instructions and care plan provided today and agrees to view in [place]. Active MyChart status and patient understanding of how to access instructions and care plan via MyChart confirmed with patient.     Telephone follow up appointment with care management team member scheduled for: 06-12-2022 at 0945 am

## 2022-04-12 NOTE — Chronic Care Management (AMB) (Signed)
Chronic Care Management   CCM RN Visit Note  04/12/2022 Name: Audrey Ruiz MRN: [id number] DOB: 12-Aug-1950  Subjective: Audrey Ruiz Audrey Ruiz is a 72 y.o. year old female who is a primary care patient of Cox, Kirsten, MD. The patient was referred to the Chronic Care Management team for assistance with care management needs subsequent to provider initiation of CCM services and plan of care.    Today's Visit:  Engaged with patient by telephone for follow up visit.        Goals Addressed             This Visit's Progress    CCM Expected Outcome:  Monitor, Self-Manage and Reduce Symptoms of Heart Failure       Current Barriers:  Chronic Disease Management support and education needs related to effective management of HF  Planned Interventions: Basic overview and discussion of pathophysiology of Heart Failure reviewed. The patient has a good understanding of her cardiac conditions and follows up as directed. Blood pressures are stable. Review of when to call the provider for changes. Also encouraged the patient to write down questions to ask the providers when she has appointments.  Provided education on low sodium diet. States her appetite is good, denies any issues with heart healthy diet, monitors sugar content as well Reviewed Heart Failure Action Plan in depth and provided written copy Assessed need for readable accurate scales in home Provided education about placing scale on hard, flat surface Advised patient to weigh each morning after emptying bladder Discussed importance of daily weight and advised patient to weigh and record daily Reviewed role of diuretics in prevention of fluid overload and management of heart failure Medication management: The patient takes Entresto 24/26 mg as directed. Denies any issues with medication cost or obtaining medications. Will continue to monitor for new needs or concerns.  Discussed the importance of keeping all appointments  with provider. The patient is compliant with appointments. Saw the pcp on 04-04-2022 and will see again on 07-08-2022 at 2 pm. Will call the office to get a brief appointment to see the pcp concerning questions about her thyroid. Education and support provided.  Provided patient with education about the role of exercise in the management of heart failure Advised patient to discuss changes in HF, questions or concerns about heart health with provider Screening for signs and symptoms of depression related to chronic disease state  Assessed social determinant of health barriers EF 50-55% 06-06-2021 Pacemaker placement in early 2023  Symptom Management: Take medications as prescribed   Attend all scheduled provider appointments Call provider office for new concerns or questions  call the Suicide and Crisis Lifeline: 988 call the [place] National Suicide Prevention Lifeline: (216)050-3804 or TTY: 623-162-2799 TTY 620-124-5992) to talk to a trained counselor call 1-800-273-TALK (toll free, 24 hour hotline) if experiencing a Mental Health or [place]  call office if I gain more than 2 pounds in one day or 5 pounds in one week use salt in moderation watch for swelling in feet, ankles and legs every day weigh myself daily develop a rescue plan follow rescue plan if symptoms flare-up track symptoms and what helps feel better or worse dress right for the weather, hot or cold  Follow Up Plan: Telephone follow up appointment with care management team member scheduled for: 06-12-2022 at 0945 am       CCM Expected Outcome:  Monitor, Self-Manage and Reduce Symptoms of:HLD       Current  Barriers:  Chronic Disease Management support and education needs related to HLD Caregiver stress: The patient assist and cares for her elderly parents and several others Lab Results  Component Value Date   CHOL 137 04/04/2022   HDL 61 04/04/2022   LDLCALC 60 04/04/2022   TRIG 84 04/04/2022   CHOLHDL  2.2 04/04/2022     Planned Interventions: Provider established cholesterol goals reviewed. Review of most recent lab work. The patient is at goal. Did discuss her Thyroid level also and it being out of range. The patient has started on Synthroid and will have follow up in 4 weeks. She has only taken 2 doses of Synthroid; Counseled on importance of regular laboratory monitoring as prescribed. Review and education provided; Provided HLD educational materials; Reviewed role and benefits of statin for ASCVD risk reduction; Discussed strategies to manage statin-induced myalgias. The patient takes Zetia 10 mg daily, Crestor 40 mg at night, and Fish Oil 1000 mg BID. She denies any issues with current medications regimen. ; Reviewed importance of limiting foods high in cholesterol. Review of heart healthy/ADA diet. ; Reviewed exercise goals and target of 150 minutes per week; Screening for signs and symptoms of depression related to chronic disease state;  Assessed social determinant of health barriers;  Discussed resources available to help with needs as they arise. Discussed self care and making sure she carves out time for self care and her own needs. The patient assist with the care of her elderly parents, mother in law, and husband. Also helps with the care of elderly aunt and recently moving an aunt to the [place] area. Discussed making sure she is monitoring her stress level and health and well being. The patient recently started Levothyroxine 25 mcg for an elevated Thyroid level of 4.845 on 04-02-2022. The patient is concerned about the lingering cough and clearing of throat that she is having. The patient ask oncology about additional testing of her Thyroid but she feels it was not addressed fully. Did ask the RNCM to ask pcp for recommendation. Secure message sent to the pcp asking for recommendation on additional testing to check her thyroid. Extensive education on how Levothyroxine replaces the  hormone thyroxine in the body and helps the thyroid work normally. Education also on regular lab work and getting medications to a therapeutic level. Will collaborate with the pcp for recommendations and follow up with the pcp according. Will send educational information in myChart for the patient. Call made back to the patient today and gave her recommendations from Dr. Tobie Poet. Dr. Tobie Poet would like the patient to come in for a brief appointment to evaluate her tyroid and if it is enlarged she would like to order an ultrasound if it is warranted. The patient given instructions and will call the office to set up an appointment to see Dr. Tobie Poet for evaluation and recommendations. The patient is thankful for the support and states she will call the office and secure an appointment to be seen.    Symptom Management: Take medications as prescribed   Attend all scheduled provider appointments Call provider office for new concerns or questions  call the Suicide and Crisis Lifeline: 988 call the [place] National Suicide Prevention Lifeline: 3230513011 or TTY: (984) 831-8682 TTY 928-661-3330) to talk to a trained counselor call 1-800-273-TALK (toll free, 24 hour hotline) if experiencing a Mental Health or [place]  - call for medicine refill 2 or 3 days before it runs out - take all medications exactly as prescribed -  call doctor with any symptoms you believe are related to your medicine - call doctor when you experience any new symptoms - go to all doctor appointments as scheduled - adhere to prescribed diet: heart healthy diet  Follow Up Plan: Telephone follow up appointment with care management team member scheduled for: 06-12-2022 at 0945 am          Plan:Telephone follow up appointment with care management team member scheduled for:  06-12-2022 at [place] am  Noreene Larsson RN, MSN, CCM RN Care Manager  Chronic Care Management Direct Number: [phone]

## 2022-04-16 ENCOUNTER — Other Ambulatory Visit: Payer: Self-pay | Admitting: Family Medicine

## 2022-04-16 NOTE — Progress Notes (Signed)
Patient stopped by so I could do a neck exam. She was concerned about her continued hoarseness following bronchitis and wanted a thyroid US. I do not feel any thyromegaly or nodules and the patient reports she had a small knot, but it has resolved.  Reassurance given. Dr. Tobie Poet

## 2022-04-18 DIAGNOSIS — E785 Hyperlipidemia, unspecified: Secondary | ICD-10-CM | POA: Diagnosis not present

## 2022-04-18 DIAGNOSIS — I5020 Unspecified systolic (congestive) heart failure: Secondary | ICD-10-CM

## 2022-04-26 ENCOUNTER — Telehealth: Payer: Self-pay | Admitting: Oncology

## 2022-04-26 NOTE — Telephone Encounter (Signed)
04/26/22 Spoke with patient and scheduled mammogram and dexa scan on 06/28/22 arrive at 1230pm

## 2022-05-06 ENCOUNTER — Ambulatory Visit: Payer: PPO | Attending: Cardiology | Admitting: Cardiology

## 2022-05-06 ENCOUNTER — Encounter: Payer: Self-pay | Admitting: Cardiology

## 2022-05-06 VITALS — BP 98/72 | HR 75 | Ht 66.5 in | Wt 135.4 lb

## 2022-05-06 DIAGNOSIS — I5022 Chronic systolic (congestive) heart failure: Secondary | ICD-10-CM | POA: Diagnosis not present

## 2022-05-06 LAB — CUP PACEART INCLINIC DEVICE CHECK
Battery Remaining Longevity: 79 mo
Brady Statistic RA Percent Paced: 0.99 %
Brady Statistic RV Percent Paced: 95 %
Date Time Interrogation Session: 20240318092138
HighPow Impedance: 67.5 Ohm
Implantable Lead Connection Status: 753985
Implantable Lead Connection Status: 753985
Implantable Lead Connection Status: 753985
Implantable Lead Implant Date: 20221219
Implantable Lead Implant Date: 20221219
Implantable Lead Implant Date: 20221219
Implantable Lead Location: 753858
Implantable Lead Location: 753859
Implantable Lead Location: 753860
Implantable Pulse Generator Implant Date: 20221219
Lead Channel Impedance Value: 337.5 Ohm
Lead Channel Impedance Value: 425 Ohm
Lead Channel Impedance Value: 650 Ohm
Lead Channel Pacing Threshold Amplitude: 0.75 V
Lead Channel Pacing Threshold Amplitude: 0.75 V
Lead Channel Pacing Threshold Amplitude: 0.75 V
Lead Channel Pacing Threshold Amplitude: 0.75 V
Lead Channel Pacing Threshold Amplitude: 1.25 V
Lead Channel Pacing Threshold Amplitude: 1.25 V
Lead Channel Pacing Threshold Pulse Width: 0.5 ms
Lead Channel Pacing Threshold Pulse Width: 0.5 ms
Lead Channel Pacing Threshold Pulse Width: 0.5 ms
Lead Channel Pacing Threshold Pulse Width: 0.5 ms
Lead Channel Pacing Threshold Pulse Width: 0.5 ms
Lead Channel Pacing Threshold Pulse Width: 0.5 ms
Lead Channel Sensing Intrinsic Amplitude: 12 mV
Lead Channel Sensing Intrinsic Amplitude: 2.8 mV
Lead Channel Setting Pacing Amplitude: 1.625
Lead Channel Setting Pacing Amplitude: 2 V
Lead Channel Setting Pacing Amplitude: 2.5 V
Lead Channel Setting Pacing Pulse Width: 0.5 ms
Lead Channel Setting Pacing Pulse Width: 0.5 ms
Lead Channel Setting Sensing Sensitivity: 0.5 mV
Pulse Gen Serial Number: 111053351
Zone Setting Status: 755011

## 2022-05-06 NOTE — Patient Instructions (Signed)
Medication Instructions:  Your physician recommends that you continue on your current medications as directed. Please refer to the Current Medication list given to you today.  *If you need a refill on your cardiac medications before your next appointment, please call your pharmacy*   Lab Work: None ordered   Testing/Procedures: None ordered   Follow-Up: At [facility], you and your health needs are our priority.  As part of our continuing mission to provide you with exceptional heart care, we have created designated Provider Care Teams.  These Care Teams include your primary Cardiologist (physician) and Advanced Practice Providers (APPs -  Physician Assistants and Nurse Practitioners) who all work together to provide you with the care you need, when you need it.  Remote monitoring is used to monitor your Pacemaker or ICD from home. This monitoring reduces the number of office visits required to check your device to one time per year. It allows [place] to keep an eye on the functioning of your device to ensure it is working properly. You are scheduled for a device check from home on 3/19, 6/18. You may send your transmission at any time that day. If you have a wireless device, the transmission will be sent automatically. After your physician reviews your transmission, you will receive a postcard with your next transmission date.  Your next appointment:   1 year(s)  The format for your next appointment:   In Person  Provider:   Allegra Lai, MD    Thank you for choosing [facility]!!   Trinidad Curet, RN [phone]

## 2022-05-06 NOTE — Progress Notes (Signed)
Electrophysiology Office Note   Date:  05/06/2022   ID:  Audrey Ruiz, DOB [birth year], MRN [id number]  PCP:  [name], MD  Cardiologist:  [place] Primary Electrophysiologist:  Kalasia Crafton Meredith Leeds, MD    Chief Complaint: CHF   History of Present Illness: Audrey Ruiz is a 72 y.o. female who is being seen today for the evaluation of CHF at the request of Cox, Elnita Maxwell, MD. Presenting today for electrophysiology evaluation.  She has a history significant hyperlipidemia, type 2 diabetes, chronic systolic heart failure due to nonischemic cardiomyopathy, breast cancer postchemotherapy.  She received Adriamycin 10 years ago for breast cancer.  She had left heart catheterization that showed no evidence of coronary artery disease.  She is status post Abbott CRT-D implanted 02/05/2021.  Today, denies symptoms of palpitations, chest pain, shortness of breath, orthopnea, PND, lower extremity edema, claudication, dizziness, presyncope, syncope, bleeding, or neurologic sequela. The patient is tolerating medications without difficulties.  She currently feels well.  She has no complaints.  She was recently diagnosed with hypothyroidism and is now on Synthroid with greatly improved her energy level.  She continues to be able to do her daily activities and has been walking for exercise.  She has some swelling in her knee but no swelling in her ankles.  She Bernis Schreur discuss this with her primary physician.   Past Medical History:  Diagnosis Date   Abnormal nuclear stress test 08/08/2020   Abnormal nuclear stress test 08/08/2020   Anxiety    Closed fracture of distal end of left radius 08/16/2020   Depressed left ventricular ejection fraction    Depression    Diabetes mellitus without complication ([place])    Dilated cardiomyopathy ([place]) 08/08/2020   Encounter for Medicare annual wellness exam 10/19/2019   Gastroesophageal reflux disease 10/19/2019   GERD (gastroesophageal reflux  disease)    Hydropneumothorax 02/08/2021   Hyperlipidemia    Hyperlipidemia associated with type 2 diabetes mellitus ([place]) 10/19/2019   LBBB (left bundle branch block) 08/17/2020   Mitral valve prolapse    Nonischemic cardiomyopathy ([place]) 08/17/2020   Osteopenia 10/19/2019   Sinusitis 08/16/2019   Upper respiratory tract infection due to COVID-19 virus 03/18/2021   Past Surgical History:  Procedure Laterality Date   BIV ICD INSERTION CRT-D N/A 02/05/2021   Procedure: BIV ICD INSERTION CRT-D;  Surgeon: [name], MD;  Location: [facility];  Service: Cardiovascular;  Laterality: N/A;   BREAST LUMPECTOMY Left 2010   CATARACT EXTRACTION BILATERAL W/ ANTERIOR VITRECTOMY  2021   LEFT HEART CATH AND CORONARY ANGIOGRAPHY N/A 08/08/2020   Procedure: LEFT HEART CATH AND CORONARY ANGIOGRAPHY;  Surgeon: Leonie Man, MD;  Location: [facility];  Service: Cardiovascular;  Laterality: N/A;     Current Outpatient Medications  Medication Sig Dispense Refill   cetirizine (ZYRTEC) 10 MG tablet Take 10 mg by mouth daily.     ENTRESTO 24-26 MG TAKE ONE TABLET BY MOUTH EVERY MORNING and TAKE ONE TABLET BY MOUTH EVERYDAY AT BEDTIME 180 tablet 3   ezetimibe (ZETIA) 10 MG tablet TAKE ONE TABLET BY MOUTH EVERYDAY AT BEDTIME 90 tablet 3   FLUoxetine (PROZAC) 40 MG capsule TAKE ONE CAPSULE BY MOUTH ONCE DAILY 90 capsule 0   fluticasone (FLONASE) 50 MCG/ACT nasal spray USE TWO SPRAYS IN EACH NOSTRIL ONCE DAILY 48 g 3   Glucosamine-Chondroitin-MSM 1500-1200-500 MG PACK Take 1 tablet by mouth daily.     HYDROcodone bit-homatropine (HYDROMET) 5-1.5 MG/5ML syrup  Take 5 mLs by mouth 2 (two) times daily as needed for cough. 120 mL 0   ibuprofen (ADVIL) 200 MG tablet Take 200 mg by mouth every 8 (eight) hours as needed for moderate pain.     levothyroxine (SYNTHROID) 25 MCG tablet Take 1 tablet (25 mcg total) by mouth daily. 90 tablet 0   LORazepam (ATIVAN) 0.5 MG tablet Take 1 tablet  (0.5 mg total) by mouth at bedtime as needed for anxiety. 30 tablet 2   Melatonin 10 MG CAPS Take 5 mg by mouth at bedtime.     Multiple Vitamin (MULTIVITAMIN) capsule Take 1 capsule by mouth daily.     Omega-3 Fatty Acids (FISH OIL) 1000 MG CAPS Take 1,000 mg by mouth 2 (two) times daily.     omeprazole (PRILOSEC) 20 MG capsule TAKE ONE CAPSULE BY MOUTH ONCE DAILY 90 capsule 0   rosuvastatin (CRESTOR) 40 MG tablet TAKE ONE TABLET BY MOUTH EVERYDAY AT BEDTIME 90 tablet 3   Vitamin D, Cholecalciferol, 25 MCG (1000 UT) CAPS Take 1,000 Units by mouth daily.     No current facility-administered medications for this visit.    Allergies:   Patient has no known allergies.   Social History:  The patient  reports that she has never smoked. She has never used smokeless tobacco. She reports current alcohol use of about 2.0 standard drinks of alcohol per week. She reports that she does not use drugs.   Family History:  The patient's family history includes Breast cancer in her daughter; Coronary artery disease in her father; Diabetes in her maternal grandfather and sister; Heart disease in her father; Hyperlipidemia in her father; Osteoporosis in her mother.   ROS:  Please see the history of present illness.   Otherwise, review of systems is positive for none.   All other systems are reviewed and negative.   PHYSICAL EXAM: VS:  BP 98/72   Pulse 75   Ht 5' 6.5" (1.689 m)   Wt 135 lb 6.4 oz (61.4 kg)   SpO2 94%   BMI 21.53 kg/m  , BMI Body mass index is 21.53 kg/m. GEN: Well nourished, well developed, in no acute distress  HEENT: normal  Neck: no JVD, carotid bruits, or masses Cardiac: RRR; no murmurs, rubs, or gallops,no edema  Respiratory:  clear to auscultation bilaterally, normal work of breathing GI: soft, nontender, nondistended, + BS MS: no deformity or atrophy  Skin: warm and dry, device site well healed Neuro:  Strength and sensation are intact Psych: euthymic mood, full  affect  EKG:  EKG is ordered today. Personal review of the ekg ordered shows A sense, V pace  Personal review of the device interrogation today. Results in [place]: 04/02/2022: ALT 25; BUN 13; Creatinine 0.73; Hemoglobin 11.6; Platelets 289; Potassium 3.9; Sodium 135; TSH 4.845    Lipid Panel     Component Value Date/Time   CHOL 137 04/04/2022 0850   TRIG 84 04/04/2022 0850   HDL 61 04/04/2022 0850   CHOLHDL 2.2 04/04/2022 0850   LDLCALC 60 04/04/2022 0850     Wt Readings from Last 3 Encounters:  05/06/22 135 lb 6.4 oz (61.4 kg)  04/04/22 134 lb (60.8 kg)  04/02/22 138 lb 9.6 oz (62.9 kg)      Other studies Reviewed: Additional studies/ records that were reviewed today include: TTE 05/30/21 Review of the above records today demonstrates:   1. GLS -13.7. Left ventricular ejection fraction, by estimation, is 50 to  55%. The left ventricle has low normal function. The left ventricle has no  regional wall motion abnormalities. Left ventricular diastolic parameters  are consistent with Grade I  diastolic dysfunction (impaired relaxation).   2. Right ventricular systolic function is normal. The right ventricular  size is normal. There is normal pulmonary artery systolic pressure.   3. The mitral valve is normal in structure. No evidence of mitral valve  regurgitation. No evidence of mitral stenosis.   4. The aortic valve is normal in structure. Aortic valve regurgitation is  not visualized. No aortic stenosis is present.   5. The inferior vena cava is normal in size with greater than 50%  respiratory variability, suggesting right atrial pressure of 3 mmHg.   LHC 08/08/20 Angiographically minimal CAD with short subepicardial portion of the proximal LAD that is relatively diminutive, but widely patent.  Very tortuous vessels. Normal to low LVEDP. Nonischemic Cardiomyopathy: question if this is related to Left Bundle Branch Block with False Positive Stress Test  related to Left Bundle Branch Block Wall Motion   ASSESSMENT AND PLAN:  1.  Chronic systolic heart failure: Due to nonischemic cardiomyopathy.  Currently on Entresto, Toprol-XL.  Status post Saint Jude CRT-D implanted 02/05/2021.  Device function appropriately.  Threshold, sensing, impedance within normal limits and stable.  No changes.  2.  Hyperlipidemia: Continue statin per primary cardiology   Current medicines are reviewed at length with the patient today.   The patient does not have concerns regarding her medicines.  The following changes were made today:  none  Labs/ tests ordered today include:  Orders Placed This Encounter  Procedures   EKG 12-Lead     Disposition:   FU 12 months  Signed, Kaileena Obi Meredith Leeds, MD  05/06/2022 8:47 AM     [facility] [address] [place] [address] [phone] (office) [phone] (fax)

## 2022-05-07 ENCOUNTER — Ambulatory Visit: Payer: HMO

## 2022-05-14 ENCOUNTER — Other Ambulatory Visit: Payer: Self-pay | Admitting: Oncology

## 2022-05-14 ENCOUNTER — Inpatient Hospital Stay: Payer: PPO

## 2022-05-14 ENCOUNTER — Inpatient Hospital Stay: Payer: PPO | Attending: Oncology | Admitting: Oncology

## 2022-05-14 VITALS — BP 131/60 | HR 79 | Temp 97.5°F | Resp 16 | Ht 66.5 in | Wt 135.8 lb

## 2022-05-14 DIAGNOSIS — D649 Anemia, unspecified: Secondary | ICD-10-CM

## 2022-05-14 DIAGNOSIS — Z853 Personal history of malignant neoplasm of breast: Secondary | ICD-10-CM | POA: Diagnosis not present

## 2022-05-14 DIAGNOSIS — Z79890 Hormone replacement therapy (postmenopausal): Secondary | ICD-10-CM | POA: Diagnosis not present

## 2022-05-14 DIAGNOSIS — E039 Hypothyroidism, unspecified: Secondary | ICD-10-CM | POA: Diagnosis not present

## 2022-05-14 DIAGNOSIS — C50212 Malignant neoplasm of upper-inner quadrant of left female breast: Secondary | ICD-10-CM | POA: Diagnosis not present

## 2022-05-14 LAB — CBC AND DIFFERENTIAL
HCT: 36 (ref 36–46)
Hemoglobin: 11.8 — AB (ref 12.0–16.0)
Neutrophils Absolute: 1.85
Platelets: 252 10*3/uL (ref 150–400)
WBC: 4.3

## 2022-05-14 LAB — CBC: RBC: 3.78 — AB (ref 3.87–5.11)

## 2022-05-14 LAB — TSH: TSH: 1.253 u[IU]/mL (ref 0.350–4.500)

## 2022-05-14 NOTE — Progress Notes (Signed)
[facility]  [address] [place],  [place]  [address] [phone]  Clinic Day:  05/14/2022  Referring physician: [name], MD   HISTORY OF PRESENT ILLNESS:  The patient is a 72 y.o. woman who I follow for mild anemia.  She comes in today to see what her hemoglobin is currently.  At her last visit, her hemoglobin was decent at 11.6.  Extensive labs done in the past did not elucidate any obvious reason behind her anemia.  Of note, her TSH level did come back mildly elevated recently, for which she is now on levothyroxine for hypothyroidism.  She denies having increased fatigue or any overt forms of blood loss which concern for progressive anemia.  Of note, this patient was also diagnosed with stage IIA (T3 N0 M0) hormone positive breast cancer, status post a lumpectomy in November 2010. This was followed by adjuvant chemotherapy and radiation.  She also took anastrozole for 6+ years for her adjuvant endocrine management.  Mammograms since then have shown no evidence of disease recurrence.   PHYSICAL EXAM:  Blood pressure 131/60, pulse 79, temperature (!) 97.5 F (36.4 C), resp. rate 16, height 5' 6.5" (1.689 m), weight 135 lb 12.8 oz (61.6 kg), SpO2 100 %. Wt Readings from Last 3 Encounters:  05/14/22 135 lb 12.8 oz (61.6 kg)  05/06/22 135 lb 6.4 oz (61.4 kg)  04/04/22 134 lb (60.8 kg)   Body mass index is 21.59 kg/m. Performance status (ECOG): 0 - Asymptomatic  Physical Exam Constitutional:      Appearance: Normal appearance. She is not ill-appearing.  HENT:     Mouth/Throat:     Mouth: Mucous membranes are moist.     Pharynx: Oropharynx is clear. No oropharyngeal exudate or posterior oropharyngeal erythema.  Cardiovascular:     Rate and Rhythm: Normal rate and regular rhythm.     Heart sounds: No murmur heard.    No friction rub. No gallop.  Pulmonary:     Effort: Pulmonary effort is normal. No respiratory distress.     Breath sounds:  Normal breath sounds. No wheezing, rhonchi or rales.  Chest:  Breasts:    Right: No swelling, bleeding, inverted nipple, mass, nipple discharge or skin change.     Left: No swelling, bleeding, inverted nipple, mass, nipple discharge or skin change.  Abdominal:     General: Bowel sounds are normal. There is no distension.     Palpations: Abdomen is soft. There is no mass.     Tenderness: There is no abdominal tenderness.  Musculoskeletal:        General: No swelling or tenderness.     Cervical back: Normal range of motion and neck supple.     Right lower leg: No edema.     Left lower leg: No edema.  Lymphadenopathy:     Cervical: No cervical adenopathy.     Right cervical: No superficial, deep or posterior cervical adenopathy.    Left cervical: No superficial, deep or posterior cervical adenopathy.     Upper Body:     Right upper body: No supraclavicular or axillary adenopathy.     Left upper body: No supraclavicular or axillary adenopathy.     Lower Body: No right inguinal adenopathy. No left inguinal adenopathy.  Skin:    General: Skin is warm.     Coloration: Skin is not jaundiced.     Findings: No lesion or rash.  Neurological:     General: No focal deficit present.  Mental Status: She is alert and oriented to person, place, and time. Mental status is at baseline.  Psychiatric:        Mood and Affect: Mood normal.        Behavior: Behavior normal.        Thought Content: Thought content normal.        Judgment: Judgment normal.    LABS:    ASSESSMENT & PLAN:  Assessment/Plan:  A 72 y.o. woman who I follow for mild anemia.  I am pleased that her hemoglobin of 11.8 is even better than what it was 6 weeks ago.  As mentioned previously, numerous labs demonstrated being behind her anemia.  As it is near normal she is clinically doing well, her mild anemia will continue to be followed conservatively.  I will see her back in 4 months for repeat clinical assessment.  The patient  understands all the plans discussed today and is in agreement with them.  Audrey Stansbery Macarthur Critchley, MD

## 2022-06-12 ENCOUNTER — Telehealth: Payer: PPO

## 2022-06-12 ENCOUNTER — Ambulatory Visit (INDEPENDENT_AMBULATORY_CARE_PROVIDER_SITE_OTHER): Payer: PPO

## 2022-06-12 DIAGNOSIS — E782 Mixed hyperlipidemia: Secondary | ICD-10-CM

## 2022-06-12 DIAGNOSIS — I5022 Chronic systolic (congestive) heart failure: Secondary | ICD-10-CM

## 2022-06-12 NOTE — Patient Instructions (Signed)
Please call the care guide team at [phone] if you need to cancel or reschedule your appointment.   If you are experiencing a Mental Health or Behavioral Health Crisis or need someone to talk to, please call the Suicide and Crisis Lifeline: 988 call the [place] National Suicide Prevention Lifeline: 727-245-5510 or TTY: 9032484647 TTY (970) 675-1448) to talk to a trained counselor call 1-800-273-TALK (toll free, 24 hour hotline) go to [facility] [address] [place] [phone])   Following is a copy of the CCM Program Consent:  CCM service includes personalized support from designated clinical staff supervised by the physician, including individualized plan of care and coordination with other care providers 24/7 contact phone numbers for assistance for urgent and routine care needs. Service will only be billed when office clinical staff spend 20 minutes or more in a month to coordinate care. Only one practitioner may furnish and bill the service in a calendar month. The patient may stop CCM services at amy time (effective at the end of the month) by phone call to the office staff. The patient will be responsible for cost sharing (co-pay) or up to 20% of the service fee (after annual deductible is met)  Following is a copy of your full provider care plan:   Goals Addressed             This Visit's Progress    CCM Expected Outcome:  Monitor, Self-Manage and Reduce Symptoms of Heart Failure       Current Barriers:  Chronic Disease Management support and education needs related to effective management of HF Wt Readings from Last 3 Encounters:  05/14/22 135 lb 12.8 oz (61.6 kg)  05/06/22 135 lb 6.4 oz (61.4 kg)  04/04/22 134 lb (60.8 kg)     Planned Interventions: Basic overview and discussion of pathophysiology of Heart Failure reviewed. The patient has a good understanding of her cardiac conditions and follows up as directed. Blood  pressures are stable. Weight is stable. She saw cardiologist on 05-06-2022 and got a good report.  Review of when to call the provider for changes. Also encouraged the patient to write down questions to ask the providers when she has appointments.  Provided education on low sodium diet. States her appetite is good, denies any issues with heart healthy diet, monitors sugar content as well Reviewed Heart Failure Action Plan in depth and provided written copy Assessed need for readable accurate scales in home Provided education about placing scale on hard, flat surface Advised patient to weigh each morning after emptying bladder Discussed importance of daily weight and advised patient to weigh and record daily Reviewed role of diuretics in prevention of fluid overload and management of heart failure Medication management: The patient takes Entresto 24/26 mg as directed. Denies any issues with medication cost or obtaining medications. Will continue to monitor for new needs or concerns.  Discussed the importance of keeping all appointments with provider. The patient is compliant with appointments.Saw the cardiologist on 05-06-2022,  will see pcp again on 07-08-2022 at 2 pm. Knows to call for changes or new needs between visits.  Provided patient with education about the role of exercise in the management of heart failure Advised patient to discuss changes in HF, questions or concerns about heart health with provider Screening for signs and symptoms of depression related to chronic disease state  Assessed social determinant of health barriers EF 50-55% 06-06-2021 Pacemaker placement in early 2023  Symptom Management: Take medications as prescribed  Attend all scheduled provider appointments Call provider office for new concerns or questions  call the Suicide and Crisis Lifeline: 988 call the [place] National Suicide Prevention Lifeline: (763)140-3634 or TTY: (817)732-6023 TTY 856-090-8925) to talk to a  trained counselor call 1-800-273-TALK (toll free, 24 hour hotline) if experiencing a Mental Health or Behavioral Health Crisis  call office if I gain more than 2 pounds in one day or 5 pounds in one week use salt in moderation watch for swelling in feet, ankles and legs every day weigh myself daily develop a rescue plan follow rescue plan if symptoms flare-up track symptoms and what helps feel better or worse dress right for the weather, hot or cold  Follow Up Plan: Telephone follow up appointment with care management team member scheduled for: 08-21-2022 at 0945 am       CCM Expected Outcome:  Monitor, Self-Manage and Reduce Symptoms of:HLD       Current Barriers:  Chronic Disease Management support and education needs related to HLD Caregiver stress: The patient assist and cares for her elderly parents and several others Lab Results  Component Value Date   CHOL 137 04/04/2022   HDL 61 04/04/2022   LDLCALC 60 04/04/2022   TRIG 84 04/04/2022   CHOLHDL 2.2 04/04/2022     Planned Interventions: Provider established cholesterol goals reviewed. Review of most recent lab work. The patient is at goal. Did discuss her Thyroid level also and it being out of range. The patient has started on Synthroid and she states that she can tell a positive difference in her energy level and how she feels. She states that she is doing well for the most part. 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 Education and support given. She will follow up with the pcp in May.  Counseled on importance of regular laboratory monitoring as prescribed. Review and education provided. Next fasting labs scheduled for May 17th; Provided HLD educational materials; Reviewed role and benefits of statin for ASCVD risk reduction; Discussed strategies to manage statin-induced myalgias. The patient takes Zetia 10 mg daily, Crestor 40 mg at night, and Fish Oil 1000 mg  BID. She denies any issues with current medications regimen. ; Reviewed importance of limiting foods high in cholesterol. Review of heart healthy/ADA diet. ; Reviewed exercise goals and target of 150 minutes per week; Screening for signs and symptoms of depression related to chronic disease state;  Assessed social determinant of health barriers;  Discussed resources available to help with needs as they arise. Discussed self care and making sure she carves out time for self care and her own needs. The patient assist with the care of her elderly parents, mother in law, and husband. Also helps with the care of elderly aunt and recently moving an aunt to the [place] area. Discussed making sure she is monitoring her stress level and health and well being. The patient recently started Levothyroxine 25 mcg for an elevated Thyroid level of 4.845 on 04-02-2022. The patient has follow up with Dr. Sedalia Muta on 07-08-2022 at 2 pm. She will have labs on 07-05-2022 and she says that she can tell a positive difference since taking the "knock off" of synthroid.  Education and support given.   Symptom Management: Take medications as prescribed   Attend all scheduled provider appointments Call provider office for new concerns or questions  call the Suicide and Crisis Lifeline: 988 call the  [place] National Suicide Prevention Lifeline: (574)785-6226 or TTY: 8726704436 TTY 870-311-5722) to talk to a trained counselor call 1-800-273-TALK (toll free, 24 hour hotline) if experiencing a Mental Health or Behavioral Health Crisis  - call for medicine refill 2 or 3 days before it runs out - take all medications exactly as prescribed - call doctor with any symptoms you believe are related to your medicine - call doctor when you experience any new symptoms - go to all doctor appointments as scheduled - adhere to prescribed diet: heart healthy diet  Follow Up Plan: Telephone follow up appointment with care management team member  scheduled for: 08-21-2022 at 0945 am          Patient verbalizes understanding of instructions and care plan provided today and agrees to view in MyChart. Active MyChart status and patient understanding of how to access instructions and care plan via MyChart confirmed with patient.  Telephone follow up appointment with care management team member scheduled for: 08-21-2022 at 0945 am

## 2022-06-12 NOTE — Chronic Care Management (AMB) (Signed)
Chronic Care Management   CCM RN Visit Note  06/12/2022 Name: Audrey Ruiz MRN: [id number] DOB: [birth year]  Subjective: Audrey Ruiz Audrey Ruiz is a 72 y.o. year old female who is a primary care patient of Cox, Kirsten, MD. The patient was referred to the Chronic Care Management team for assistance with care management needs subsequent to provider initiation of CCM services and plan of care.    Today's Visit:  Engaged with patient by telephone for follow up visit.     SDOH Interventions Today    Flowsheet Row Most Recent Value  SDOH Interventions   Social Connections Interventions Intervention Not Indicated         Goals Addressed             This Visit's Progress    CCM Expected Outcome:  Monitor, Self-Manage and Reduce Symptoms of Heart Failure       Current Barriers:  Chronic Disease Management support and education needs related to effective management of HF Wt Readings from Last 3 Encounters:  05/14/22 135 lb 12.8 oz (61.6 kg)  05/06/22 135 lb 6.4 oz (61.4 kg)  04/04/22 134 lb (60.8 kg)     Planned Interventions: Basic overview and discussion of pathophysiology of Heart Failure reviewed. The patient has a good understanding of her cardiac conditions and follows up as directed. Blood pressures are stable. Weight is stable. She saw cardiologist on 05-06-2022 and got a good report.  Review of when to call the provider for changes. Also encouraged the patient to write down questions to ask the providers when she has appointments.  Provided education on low sodium diet. States her appetite is good, denies any issues with heart healthy diet, monitors sugar content as well Reviewed Heart Failure Action Plan in depth and provided written copy Assessed need for readable accurate scales in home Provided education about placing scale on hard, flat surface Advised patient to weigh each morning after emptying bladder Discussed importance of daily weight and advised  patient to weigh and record daily Reviewed role of diuretics in prevention of fluid overload and management of heart failure Medication management: The patient takes Entresto 24/26 mg as directed. Denies any issues with medication cost or obtaining medications. Will continue to monitor for new needs or concerns.  Discussed the importance of keeping all appointments with provider. The patient is compliant with appointments.Saw the cardiologist on 05-06-2022,  will see pcp again on 07-08-2022 at 2 pm. Knows to call for changes or new needs between visits.  Provided patient with education about the role of exercise in the management of heart failure Advised patient to discuss changes in HF, questions or concerns about heart health with provider Screening for signs and symptoms of depression related to chronic disease state  Assessed social determinant of health barriers EF 50-55% 06-06-2021 Pacemaker placement in early 2023  Symptom Management: Take medications as prescribed   Attend all scheduled provider appointments Call provider office for new concerns or questions  call the Suicide and Crisis Lifeline: 988 call the [place] National Suicide Prevention Lifeline: 850-319-2233 or TTY: 450-298-7502 TTY (854)161-5714) to talk to a trained counselor call 1-800-273-TALK (toll free, 24 hour hotline) if experiencing a Mental Health or Behavioral Health Crisis  call office if I gain more than 2 pounds in one day or 5 pounds in one week use salt in moderation watch for swelling in feet, ankles and legs every day weigh myself daily develop a rescue plan follow rescue plan if symptoms  flare-up track symptoms and what helps feel better or worse dress right for the weather, hot or cold  Follow Up Plan: Telephone follow up appointment with care management team member scheduled for: 08-21-2022 at 0945 am       CCM Expected Outcome:  Monitor, Self-Manage and Reduce Symptoms of:HLD       Current Barriers:   Chronic Disease Management support and education needs related to HLD Caregiver stress: The patient assist and cares for her elderly parents and several others Lab Results  Component Value Date   CHOL 137 04/04/2022   HDL 61 04/04/2022   LDLCALC 60 04/04/2022   TRIG 84 04/04/2022   CHOLHDL 2.2 04/04/2022     Planned Interventions: Provider established cholesterol goals reviewed. Review of most recent lab work. The patient is at goal. Did discuss her Thyroid level also and it being out of range. The patient has started on Synthroid and she states that she can tell a positive difference in her energy level and how she feels. She states that she is doing well for the most part. 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 Education and support given. She will follow up with the pcp in May.  Counseled on importance of regular laboratory monitoring as prescribed. Review and education provided. Next fasting labs scheduled for May 17th; Provided HLD educational materials; Reviewed role and benefits of statin for ASCVD risk reduction; Discussed strategies to manage statin-induced myalgias. The patient takes Zetia 10 mg daily, Crestor 40 mg at night, and Fish Oil 1000 mg BID. She denies any issues with current medications regimen. ; Reviewed importance of limiting foods high in cholesterol. Review of heart healthy/ADA diet. ; Reviewed exercise goals and target of 150 minutes per week; Screening for signs and symptoms of depression related to chronic disease state;  Assessed social determinant of health barriers;  Discussed resources available to help with needs as they arise. Discussed self care and making sure she carves out time for self care and her own needs. The patient assist with the care of her elderly parents, mother in law, and husband. Also helps with the care of elderly aunt and recently moving an aunt to the [place] area.  Discussed making sure she is monitoring her stress level and health and well being. The patient recently started Levothyroxine 25 mcg for an elevated Thyroid level of 4.845 on 04-02-2022. The patient has follow up with Dr. Sedalia Muta on 07-08-2022 at 2 pm. She will have labs on 07-05-2022 and she says that she can tell a positive difference since taking the "knock off" of synthroid.  Education and support given.   Symptom Management: Take medications as prescribed   Attend all scheduled provider appointments Call provider office for new concerns or questions  call the Suicide and Crisis Lifeline: 988 call the [place] National Suicide Prevention Lifeline: (506)150-2361 or TTY: 248-155-4355 TTY 262-196-8600) to talk to a trained counselor call 1-800-273-TALK (toll free, 24 hour hotline) if experiencing a Mental Health or Behavioral Health Crisis  - call for medicine refill 2 or 3 days before it runs out - take all medications exactly as prescribed - call doctor with any symptoms you believe are related to your medicine - call doctor when you experience any new symptoms - go to all doctor appointments as scheduled - adhere to prescribed diet: heart healthy diet  Follow Up Plan: Telephone follow up appointment with  care management team member scheduled for: 08-21-2022 at 0945 am          Plan:Telephone follow up appointment with care management team member scheduled for:  08-21-2022 at 0945 am  Alto Denver RN, MSN, CCM RN Care Manager  Chronic Care Management Direct Number: [phone]

## 2022-06-13 DIAGNOSIS — Z950 Presence of cardiac pacemaker: Secondary | ICD-10-CM | POA: Diagnosis not present

## 2022-06-13 DIAGNOSIS — Z6821 Body mass index (BMI) 21.0-21.9, adult: Secondary | ICD-10-CM | POA: Diagnosis not present

## 2022-06-13 DIAGNOSIS — I509 Heart failure, unspecified: Secondary | ICD-10-CM | POA: Diagnosis not present

## 2022-06-13 DIAGNOSIS — I495 Sick sinus syndrome: Secondary | ICD-10-CM | POA: Diagnosis not present

## 2022-06-13 DIAGNOSIS — D692 Other nonthrombocytopenic purpura: Secondary | ICD-10-CM | POA: Diagnosis not present

## 2022-06-13 DIAGNOSIS — F3342 Major depressive disorder, recurrent, in full remission: Secondary | ICD-10-CM | POA: Diagnosis not present

## 2022-06-13 DIAGNOSIS — I110 Hypertensive heart disease with heart failure: Secondary | ICD-10-CM | POA: Diagnosis not present

## 2022-06-18 DIAGNOSIS — I5020 Unspecified systolic (congestive) heart failure: Secondary | ICD-10-CM | POA: Diagnosis not present

## 2022-06-18 DIAGNOSIS — E785 Hyperlipidemia, unspecified: Secondary | ICD-10-CM | POA: Diagnosis not present

## 2022-06-27 ENCOUNTER — Other Ambulatory Visit: Payer: Self-pay | Admitting: Family Medicine

## 2022-06-27 DIAGNOSIS — K219 Gastro-esophageal reflux disease without esophagitis: Secondary | ICD-10-CM

## 2022-06-28 DIAGNOSIS — M810 Age-related osteoporosis without current pathological fracture: Secondary | ICD-10-CM | POA: Diagnosis not present

## 2022-06-28 DIAGNOSIS — Z1231 Encounter for screening mammogram for malignant neoplasm of breast: Secondary | ICD-10-CM | POA: Diagnosis not present

## 2022-06-28 LAB — HM DEXA SCAN

## 2022-06-30 ENCOUNTER — Other Ambulatory Visit: Payer: Self-pay

## 2022-06-30 DIAGNOSIS — I5022 Chronic systolic (congestive) heart failure: Secondary | ICD-10-CM

## 2022-06-30 DIAGNOSIS — E038 Other specified hypothyroidism: Secondary | ICD-10-CM

## 2022-06-30 DIAGNOSIS — E782 Mixed hyperlipidemia: Secondary | ICD-10-CM

## 2022-06-30 DIAGNOSIS — R7303 Prediabetes: Secondary | ICD-10-CM

## 2022-07-02 ENCOUNTER — Other Ambulatory Visit: Payer: Self-pay | Admitting: Family Medicine

## 2022-07-02 DIAGNOSIS — E038 Other specified hypothyroidism: Secondary | ICD-10-CM

## 2022-07-05 ENCOUNTER — Other Ambulatory Visit: Payer: PPO

## 2022-07-05 DIAGNOSIS — E782 Mixed hyperlipidemia: Secondary | ICD-10-CM | POA: Diagnosis not present

## 2022-07-05 DIAGNOSIS — I5022 Chronic systolic (congestive) heart failure: Secondary | ICD-10-CM | POA: Diagnosis not present

## 2022-07-05 DIAGNOSIS — R7303 Prediabetes: Secondary | ICD-10-CM | POA: Diagnosis not present

## 2022-07-05 DIAGNOSIS — E038 Other specified hypothyroidism: Secondary | ICD-10-CM

## 2022-07-06 LAB — CBC WITH DIFFERENTIAL/PLATELET
Basophils Absolute: 0 10*3/uL (ref 0.0–0.2)
Basos: 1 %
EOS (ABSOLUTE): 0.2 10*3/uL (ref 0.0–0.4)
Eos: 5 %
Hematocrit: 39.7 % (ref 34.0–46.6)
Hemoglobin: 12.6 g/dL (ref 11.1–15.9)
Immature Grans (Abs): 0 10*3/uL (ref 0.0–0.1)
Immature Granulocytes: 0 %
Lymphocytes Absolute: 2 10*3/uL (ref 0.7–3.1)
Lymphs: 47 %
MCH: 31 pg (ref 26.6–33.0)
MCHC: 31.7 g/dL (ref 31.5–35.7)
MCV: 98 fL — ABNORMAL HIGH (ref 79–97)
Monocytes Absolute: 0.3 10*3/uL (ref 0.1–0.9)
Monocytes: 7 %
Neutrophils Absolute: 1.6 10*3/uL (ref 1.4–7.0)
Neutrophils: 40 %
Platelets: 242 10*3/uL (ref 150–450)
RBC: 4.07 x10E6/uL (ref 3.77–5.28)
RDW: 12.6 % (ref 11.7–15.4)
WBC: 4.1 10*3/uL (ref 3.4–10.8)

## 2022-07-06 LAB — LIPID PANEL
Chol/HDL Ratio: 1.8 ratio (ref 0.0–4.4)
Cholesterol, Total: 132 mg/dL (ref 100–199)
HDL: 74 mg/dL (ref >39)
LDL Chol Calc (NIH): 45 mg/dL (ref 0–99)
Triglycerides: 63 mg/dL (ref 0–149)
VLDL Cholesterol Cal: 13 mg/dL (ref 5–40)

## 2022-07-06 LAB — COMPREHENSIVE METABOLIC PANEL
ALT: 26 IU/L (ref 0–32)
AST: 29 IU/L (ref 0–40)
Albumin/Globulin Ratio: 2 (ref 1.2–2.2)
Albumin: 4.2 g/dL (ref 3.8–4.8)
Alkaline Phosphatase: 57 IU/L (ref 44–121)
BUN/Creatinine Ratio: 18 (ref 12–28)
BUN: 12 mg/dL (ref 8–27)
Bilirubin Total: 0.6 mg/dL (ref 0.0–1.2)
CO2: 22 mmol/L (ref 20–29)
Calcium: 10.3 mg/dL (ref 8.7–10.3)
Chloride: 105 mmol/L (ref 96–106)
Creatinine, Ser: 0.65 mg/dL (ref 0.57–1.00)
Globulin, Total: 2.1 g/dL (ref 1.5–4.5)
Glucose: 100 mg/dL — ABNORMAL HIGH (ref 70–99)
Potassium: 4.6 mmol/L (ref 3.5–5.2)
Sodium: 141 mmol/L (ref 134–144)
Total Protein: 6.3 g/dL (ref 6.0–8.5)
eGFR: 93 mL/min/{1.73_m2} (ref >59)

## 2022-07-06 LAB — CARDIOVASCULAR RISK ASSESSMENT

## 2022-07-06 LAB — T4, FREE: Free T4: 1.22 ng/dL (ref 0.82–1.77)

## 2022-07-06 LAB — TSH: TSH: 2.84 u[IU]/mL (ref 0.450–4.500)

## 2022-07-06 LAB — HEMOGLOBIN A1C
Est. average glucose Bld gHb Est-mCnc: 117 mg/dL
Hgb A1c MFr Bld: 5.7 % — ABNORMAL HIGH (ref 4.8–5.6)

## 2022-07-08 ENCOUNTER — Encounter: Payer: Self-pay | Admitting: Family Medicine

## 2022-07-08 ENCOUNTER — Ambulatory Visit (INDEPENDENT_AMBULATORY_CARE_PROVIDER_SITE_OTHER): Payer: PPO | Admitting: Family Medicine

## 2022-07-08 VITALS — BP 128/64 | HR 72 | Temp 97.1°F | Resp 16 | Ht 66.5 in | Wt 135.0 lb

## 2022-07-08 DIAGNOSIS — E782 Mixed hyperlipidemia: Secondary | ICD-10-CM | POA: Diagnosis not present

## 2022-07-08 DIAGNOSIS — M25561 Pain in right knee: Secondary | ICD-10-CM

## 2022-07-08 DIAGNOSIS — K219 Gastro-esophageal reflux disease without esophagitis: Secondary | ICD-10-CM | POA: Diagnosis not present

## 2022-07-08 DIAGNOSIS — I5022 Chronic systolic (congestive) heart failure: Secondary | ICD-10-CM

## 2022-07-08 DIAGNOSIS — M25562 Pain in left knee: Secondary | ICD-10-CM

## 2022-07-08 DIAGNOSIS — R7303 Prediabetes: Secondary | ICD-10-CM

## 2022-07-08 DIAGNOSIS — G8929 Other chronic pain: Secondary | ICD-10-CM

## 2022-07-08 DIAGNOSIS — E038 Other specified hypothyroidism: Secondary | ICD-10-CM

## 2022-07-08 NOTE — Progress Notes (Signed)
Subjective:  Patient ID: Audrey Ruiz, female    DOB: 23-Apr-1950  Age: 72 y.o. MRN: [id number]  Chief Complaint  Patient presents with   Medical Management of Chronic Issues   Knee Pain    Bilateral    HPI   Pre- Diabetes: Last Hba1c: 5.5  Nonischemic cardiomyopathy thought to be due to adriamycin. CORONARY ARTERY DISEASE noncritical cad, CONGESTIVE HEART FAILURE - on entresto. Patient is status post BiV ICD 2022 with improved EF on her most recent echocardiogram 50 to 55% Entresto 24-26 twice daily.    Hyperlipidemia: on zetia 10 mg once daily, on crestor 40 mg once daily at night.   GERD: Currently on omeprazole 20 mg once daily.  Depression, mild, recurrent: Currently on Prozac 40 mg once daily.    Insomnia: takes melatonin. would like lorazepam.  Patient was given a prescription for lorazepam nearly 2 years ago for 30 pills and has not used them up.        07/08/2022    1:59 PM 04/04/2022    8:52 AM 01/23/2022   10:27 AM 10/30/2021    2:30 PM 08/06/2021   11:21 AM  Depression screen PHQ 2/9  Decreased Interest 1 0 0 0 1  Down, Depressed, Hopeless 1 0 0 0 1  PHQ - 2 Score 2 0 0 0 2  Altered sleeping 0 0   0  Tired, decreased energy 1 0   2  Change in appetite 0 0   0  Feeling bad or failure about yourself  0 0   1  Trouble concentrating 0 0   1  Moving slowly or fidgety/restless 0 0   0  Suicidal thoughts 0 0   0  PHQ-9 Score 3 0   6  Difficult doing work/chores Not difficult at all Not difficult at all   Somewhat difficult      Patient Care Team: Blane Ohara, MD as PCP - General (Family Medicine) Thomasene Ripple, DO as PCP - Cardiology (Cardiology) Regan Lemming, MD as PCP - Electrophysiology (Cardiology) Weston Settle, MD as Consulting Physician (Oncology) Thomasene Ripple, DO as Consulting Physician (Cardiology) Karma Greaser, PA as Physician Assistant (Orthopedic Surgery) Albin Felling, OD (Optometry) Zettie Pho, [facility]  (Pharmacist) Marlowe Sax, RN as Case Manager (General Practice)   Review of Systems  Constitutional:  Negative for chills, fatigue and fever.  HENT:  Negative for congestion, rhinorrhea and sore throat.   Respiratory:  Negative for cough and shortness of breath.   Cardiovascular:  Negative for chest pain.  Gastrointestinal:  Negative for abdominal pain, constipation, diarrhea, nausea and vomiting.  Genitourinary:  Negative for dysuria and urgency.  Musculoskeletal:  Positive for arthralgias (bilateral knee pain). Negative for back pain and myalgias.  Neurological:  Negative for dizziness, weakness, light-headedness and headaches.  Hematological:        Varicosities lower legs and ankles.  Psychiatric/Behavioral:  Positive for dysphoric mood. The patient is not nervous/anxious.     Current Outpatient Medications on File Prior to Visit  Medication Sig Dispense Refill   cetirizine (ZYRTEC) 10 MG tablet Take 10 mg by mouth daily.     ENTRESTO 24-26 MG TAKE ONE TABLET BY MOUTH EVERY MORNING and TAKE ONE TABLET BY MOUTH EVERYDAY AT BEDTIME 180 tablet 3   ezetimibe (ZETIA) 10 MG tablet TAKE ONE TABLET BY MOUTH EVERYDAY AT BEDTIME 90 tablet 3   FLUoxetine (PROZAC) 40 MG capsule TAKE ONE CAPSULE BY MOUTH ONCE  DAILY 90 capsule 0   fluticasone (FLONASE) 50 MCG/ACT nasal spray USE TWO SPRAYS IN EACH NOSTRIL ONCE DAILY 48 g 3   Glucosamine-Chondroitin-MSM 1500-1200-500 MG PACK Take 1 tablet by mouth daily.     HYDROcodone bit-homatropine (HYDROMET) 5-1.5 MG/5ML syrup Take 5 mLs by mouth 2 (two) times daily as needed for cough. 120 mL 0   ibuprofen (ADVIL) 200 MG tablet Take 200 mg by mouth every 8 (eight) hours as needed for moderate pain.     levothyroxine (SYNTHROID) 25 MCG tablet TAKE ONE TABLET BY MOUTH ONCE DAILY 90 tablet 0   LORazepam (ATIVAN) 0.5 MG tablet Take 1 tablet (0.5 mg total) by mouth at bedtime as needed for anxiety. 30 tablet 2   Melatonin 10 MG CAPS Take 5 mg by mouth at  bedtime.     Multiple Vitamin (MULTIVITAMIN) capsule Take 1 capsule by mouth daily.     Omega-3 Fatty Acids (FISH OIL) 1000 MG CAPS Take 1,000 mg by mouth 2 (two) times daily.     omeprazole (PRILOSEC) 20 MG capsule TAKE ONE CAPSULE BY MOUTH ONCE DAILY 90 capsule 0   rosuvastatin (CRESTOR) 40 MG tablet TAKE ONE TABLET BY MOUTH EVERYDAY AT BEDTIME 90 tablet 3   Vitamin D, Cholecalciferol, 25 MCG (1000 UT) CAPS Take 1,000 Units by mouth daily.     No current facility-administered medications on file prior to visit.   Past Medical History:  Diagnosis Date   Abnormal nuclear stress test 08/08/2020   Abnormal nuclear stress test 08/08/2020   Anxiety    Closed fracture of distal end of left radius 08/16/2020   Depressed left ventricular ejection fraction    Depression    Diabetes mellitus without complication (HCC)    Dilated cardiomyopathy (HCC) 08/08/2020   Encounter for Medicare annual wellness exam 10/19/2019   Gastroesophageal reflux disease 10/19/2019   GERD (gastroesophageal reflux disease)    Hydropneumothorax 02/08/2021   Hyperlipidemia    Hyperlipidemia associated with type 2 diabetes mellitus (HCC) 10/19/2019   LBBB (left bundle branch block) 08/17/2020   Mitral valve prolapse    Nonischemic cardiomyopathy (HCC) 08/17/2020   Osteopenia 10/19/2019   Sinusitis 08/16/2019   Upper respiratory tract infection due to COVID-19 virus 03/18/2021   Past Surgical History:  Procedure Laterality Date   BIV ICD INSERTION CRT-D N/A 02/05/2021   Procedure: BIV ICD INSERTION CRT-D;  Surgeon: Regan Lemming, MD;  Location: [facility] INVASIVE CV LAB;  Service: Cardiovascular;  Laterality: N/A;   BREAST LUMPECTOMY Left 2010   CATARACT EXTRACTION BILATERAL W/ ANTERIOR VITRECTOMY  2021   LEFT HEART CATH AND CORONARY ANGIOGRAPHY N/A 08/08/2020   Procedure: LEFT HEART CATH AND CORONARY ANGIOGRAPHY;  Surgeon: Marykay Lex, MD;  Location: [facility] INVASIVE CV LAB;  Service: Cardiovascular;   Laterality: N/A;    Family History  Problem Relation Age of Onset   Osteoporosis Mother    Heart disease Father    Hyperlipidemia Father    Coronary artery disease Father    Diabetes Sister    Breast cancer Daughter    Diabetes Maternal Grandfather    Social History   Socioeconomic History   Marital status: Married    Spouse name: Viviann Spare   Number of children: 2   Years of education: Not on file   Highest education level: Master's degree (e.g., MA, MS, MEng, MEd, MSW, MBA)  Occupational History   Occupation: Retired  Tobacco Use   Smoking status: Never   Smokeless tobacco: Never  Vaping Use  Vaping Use: Never used  Substance and Sexual Activity   Alcohol use: Yes    Alcohol/week: 2.0 standard drinks of alcohol    Types: 2 Glasses of wine per week    Comment: occasional (white wine)   Drug use: Never   Sexual activity: Not Currently  Other Topics Concern   Not on file  Social History Narrative   Son lives in [place], Daughter lives in [place]   Patient 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 mother and father as well as two aunts   Social Determinants of Health   Financial Resource Strain: Medium Risk (01/23/2022)   Overall Financial Resource Strain (CARDIA)    Difficulty of Paying Living Expenses: Somewhat hard  Food Insecurity: No Food Insecurity (01/23/2022)   Hunger Vital Sign    Worried About Running Out of Food in the Last Year: Never true    Ran Out of Food in the Last Year: Never true  Transportation Needs: No Transportation Needs (01/23/2022)   PRAPARE - Administrator, Civil Service (Medical): No    Lack of Transportation (Non-Medical): No  Physical Activity: Sufficiently Active (01/23/2022)   Exercise Vital Sign    Days of Exercise per Week: 6 days    Minutes of Exercise per Session: 30 min  Stress: Stress Concern Present (01/23/2022)   [employer] of Occupational Health - Occupational Stress Questionnaire    Feeling of Stress : To some extent   Social Connections: Socially Integrated (06/12/2022)   Social Connection and Isolation Panel [NHANES]    Frequency of Communication with Friends and Family: More than three times a week    Frequency of Social Gatherings with Friends and Family: More than three times a week    Attends Religious Services: More than 4 times per year    Active Member of [employer] or Organizations: Yes    Attends Engineer, structural: More than 4 times per year    Marital Status: Married    Objective:  BP 128/64   Pulse 72   Temp (!) 97.1 F (36.2 C)   Resp 16   Ht 5' 6.5" (1.689 m)   Wt 135 lb (61.2 kg)   BMI 21.46 kg/m      07/08/2022    2:37 PM 05/14/2022   10:50 AM 05/06/2022    8:22 AM  BP/Weight  Systolic BP 128 131 98  Diastolic BP 64 60 72  Wt. (Lbs) 135 135.8 135.4  BMI 21.46 kg/m2 21.59 kg/m2 21.53 kg/m2    Physical Exam Vitals reviewed.  Constitutional:      Appearance: Normal appearance. She is normal weight.  Neck:     Vascular: No carotid bruit.  Cardiovascular:     Rate and Rhythm: Normal rate and regular rhythm.     Heart sounds: Normal heart sounds.  Pulmonary:     Effort: Pulmonary effort is normal. No respiratory distress.     Breath sounds: Normal breath sounds.  Abdominal:     General: Abdomen is flat. Bowel sounds are normal.     Palpations: Abdomen is soft.     Tenderness: There is no abdominal tenderness.  Neurological:     Mental Status: She is alert and oriented to person, place, and time.  Psychiatric:        Mood and Affect: Mood normal.        Behavior: Behavior normal.     Diabetic Foot Exam - Simple   No data filed      Lab  Results  Component Value Date   WBC 4.1 07/05/2022   HGB 12.6 07/05/2022   HCT 39.7 07/05/2022   PLT 242 07/05/2022   GLUCOSE 100 (H) 07/05/2022   CHOL 132 07/05/2022   TRIG 63 07/05/2022   HDL 74 07/05/2022   LDLCALC 45 07/05/2022   ALT 26 07/05/2022   AST 29 07/05/2022   NA 141 07/05/2022   K 4.6 07/05/2022    CL 105 07/05/2022   CREATININE 0.65 07/05/2022   BUN 12 07/05/2022   CO2 22 07/05/2022   TSH 2.840 07/05/2022   HGBA1C 5.7 (H) 07/05/2022      Assessment & Plan:    Chronic pain of right knee Assessment & Plan: Ordered Right knee 1-2V xray  Orders: -     DG Knee 1-2 Views Right; Future  Mixed hyperlipidemia Assessment & Plan: Well controlled.  No changes to medicines. Zetia 10 mg daily, rosuvastatin 40 mg daily, fish oil 1000 mg BID. Continue to work on eating a healthy diet and exercise.     Prediabetes Assessment & Plan: Hemoglobin A1c 6.1%, 3 month avg of blood sugars, is in prediabetic range.  In order to prevent progression to diabetes, recommend low carb diet and regular exercise    Other specified hypothyroidism Assessment & Plan: Previously well controlled Continue Synthroid at current dose      Chronic systolic congestive heart failure [facility]) Assessment & Plan: Management per specialist. The current medical regimen is effective;  continue present plan and medications. Taking Entresto 24-26 mg daily.    Gastroesophageal reflux disease without esophagitis Assessment & Plan: The current medical regimen is effective;  continue present plan and medications. Taking Omeprazole 20 mg daily   Chronic pain of left knee Assessment & Plan: Ordered Left knee 1-2V xray  Orders: -     DG Knee 1-2 Views Left; Future     No orders of the defined types were placed in this encounter.   Orders Placed This Encounter  Procedures   DG Knee 1-2 Views Left   DG Knee 1-2 Views Right     Follow-up: Return in about 6 months (around 01/08/2023) for chronic follow up, fasting.   I,Carolyn M Morrison,acting as a Neurosurgeon for Blane Ohara, MD.,have documented all relevant documentation on the behalf of Blane Ohara, MD,as directed by  Blane Ohara, MD while in the presence of Blane Ohara, MD.   An After Visit Summary was printed and given to the patient.  I attest  that I have reviewed this visit and agree with the plan scribed by my staff.   Blane Ohara, MD [facility] [phone]

## 2022-07-08 NOTE — Patient Instructions (Signed)
Ibuprofen 600 mg three times daily for the next week. Xray if pain does not improve.

## 2022-07-09 DIAGNOSIS — G8929 Other chronic pain: Secondary | ICD-10-CM | POA: Insufficient documentation

## 2022-07-09 NOTE — Assessment & Plan Note (Signed)
Ordered Right knee 1-2V xray

## 2022-07-09 NOTE — Assessment & Plan Note (Signed)
Well controlled.  No changes to medicines. Zetia 10 mg daily, rosuvastatin 40 mg daily, fish oil 1000 mg BID. Continue to work on eating a healthy diet and exercise.

## 2022-07-09 NOTE — Assessment & Plan Note (Signed)
Previously well controlled Continue Synthroid at current dose  

## 2022-07-09 NOTE — Assessment & Plan Note (Signed)
Management per specialist. The current medical regimen is effective;  continue present plan and medications. Taking Entresto 24-26 mg daily.  

## 2022-07-09 NOTE — Assessment & Plan Note (Signed)
Ordered Left knee 1-2V xray

## 2022-07-09 NOTE — Assessment & Plan Note (Signed)
The current medical regimen is effective;  continue present plan and medications. Taking Omeprazole 20 mg daily 

## 2022-07-09 NOTE — Assessment & Plan Note (Signed)
Hemoglobin A1c 6.1%, 3 month avg of blood sugars, is in prediabetic range.  In order to prevent progression to diabetes, recommend low carb diet and regular exercise  

## 2022-07-14 ENCOUNTER — Encounter: Payer: Self-pay | Admitting: Family Medicine

## 2022-07-16 LAB — CUP PACEART REMOTE DEVICE CHECK
Battery Remaining Longevity: 78 mo
Battery Remaining Percentage: 82 %
Battery Voltage: 2.96 V
Brady Statistic AP VP Percent: 1 %
Brady Statistic AP VS Percent: 1 %
Brady Statistic AS VP Percent: 99 %
Brady Statistic AS VS Percent: 1 %
Brady Statistic RA Percent Paced: 1 %
Date Time Interrogation Session: 20240319030024
HighPow Impedance: 68 Ohm
Implantable Lead Connection Status: 753985
Implantable Lead Connection Status: 753985
Implantable Lead Connection Status: 753985
Implantable Lead Implant Date: 20221219
Implantable Lead Implant Date: 20221219
Implantable Lead Implant Date: 20221219
Implantable Lead Location: 753858
Implantable Lead Location: 753859
Implantable Lead Location: 753860
Implantable Pulse Generator Implant Date: 20221219
Lead Channel Impedance Value: 330 Ohm
Lead Channel Impedance Value: 430 Ohm
Lead Channel Impedance Value: 780 Ohm
Lead Channel Pacing Threshold Amplitude: 0.625 V
Lead Channel Pacing Threshold Amplitude: 0.75 V
Lead Channel Pacing Threshold Amplitude: 1.625 V
Lead Channel Pacing Threshold Pulse Width: 0.5 ms
Lead Channel Pacing Threshold Pulse Width: 0.5 ms
Lead Channel Pacing Threshold Pulse Width: 0.5 ms
Lead Channel Sensing Intrinsic Amplitude: 1.5 mV
Lead Channel Sensing Intrinsic Amplitude: 12 mV
Lead Channel Setting Pacing Amplitude: 1.625
Lead Channel Setting Pacing Amplitude: 2 V
Lead Channel Setting Pacing Amplitude: 2.125
Lead Channel Setting Pacing Pulse Width: 0.5 ms
Lead Channel Setting Pacing Pulse Width: 0.5 ms
Lead Channel Setting Sensing Sensitivity: 0.5 mV
Pulse Gen Serial Number: 111053351
Zone Setting Status: 755011

## 2022-07-19 ENCOUNTER — Encounter: Payer: Self-pay | Admitting: Oncology

## 2022-07-22 ENCOUNTER — Encounter: Payer: Self-pay | Admitting: Oncology

## 2022-08-06 ENCOUNTER — Ambulatory Visit (INDEPENDENT_AMBULATORY_CARE_PROVIDER_SITE_OTHER): Payer: PPO

## 2022-08-06 DIAGNOSIS — I5022 Chronic systolic (congestive) heart failure: Secondary | ICD-10-CM

## 2022-08-06 DIAGNOSIS — I428 Other cardiomyopathies: Secondary | ICD-10-CM | POA: Diagnosis not present

## 2022-08-07 LAB — CUP PACEART REMOTE DEVICE CHECK
Battery Remaining Longevity: 73 mo
Battery Remaining Percentage: 78 %
Battery Voltage: 2.96 V
Brady Statistic AP VP Percent: 1 %
Brady Statistic AP VS Percent: 1 %
Brady Statistic AS VP Percent: 96 %
Brady Statistic AS VS Percent: 1 %
Brady Statistic RA Percent Paced: 1 %
Date Time Interrogation Session: 20240618020048
HighPow Impedance: 62 Ohm
Implantable Lead Connection Status: 753985
Implantable Lead Connection Status: 753985
Implantable Lead Connection Status: 753985
Implantable Lead Implant Date: 20221219
Implantable Lead Implant Date: 20221219
Implantable Lead Implant Date: 20221219
Implantable Lead Location: 753858
Implantable Lead Location: 753859
Implantable Lead Location: 753860
Implantable Pulse Generator Implant Date: 20221219
Lead Channel Impedance Value: 300 Ohm
Lead Channel Impedance Value: 410 Ohm
Lead Channel Impedance Value: 640 Ohm
Lead Channel Pacing Threshold Amplitude: 0.75 V
Lead Channel Pacing Threshold Amplitude: 0.75 V
Lead Channel Pacing Threshold Amplitude: 1.25 V
Lead Channel Pacing Threshold Pulse Width: 0.5 ms
Lead Channel Pacing Threshold Pulse Width: 0.5 ms
Lead Channel Pacing Threshold Pulse Width: 0.5 ms
Lead Channel Sensing Intrinsic Amplitude: 1.7 mV
Lead Channel Sensing Intrinsic Amplitude: 12 mV
Lead Channel Setting Pacing Amplitude: 1.75 V
Lead Channel Setting Pacing Amplitude: 1.75 V
Lead Channel Setting Pacing Amplitude: 2 V
Lead Channel Setting Pacing Pulse Width: 0.5 ms
Lead Channel Setting Pacing Pulse Width: 0.5 ms
Lead Channel Setting Sensing Sensitivity: 0.5 mV
Pulse Gen Serial Number: 111053351
Zone Setting Status: 755011

## 2022-08-21 ENCOUNTER — Telehealth: Payer: PPO

## 2022-08-28 NOTE — Progress Notes (Signed)
Remote ICD transmission.   

## 2022-09-12 NOTE — Progress Notes (Signed)
[facility]  [address] [place],  [place]  [address] [phone]  Clinic Day:  09/13/2022  Referring physician: Blane Ohara, MD   HISTORY OF PRESENT ILLNESS:  The patient is a 72 y.o. woman who I follow for mild anemia.  She comes in today to see what her hemoglobin is currently.  At her last visit, her hemoglobin was decent at 11.6.  Extensive labs done in the past did not elucidate any obvious reason behind her anemia.  Of note, her TSH level did come back mildly elevated recently, for which she is now on levothyroxine for hypothyroidism.  She denies having increased fatigue or any overt forms of blood loss which concern for progressive anemia.  Of note, this patient was also diagnosed with stage IIA (T3 N0 M0) hormone positive breast cancer, status post a lumpectomy in November 2010. This was followed by adjuvant chemotherapy and radiation.  She also took anastrozole for 6+ years for her adjuvant endocrine management.  Mammograms since then have shown no evidence of disease recurrence.  PHYSICAL EXAM:  Blood pressure (!) 119/59, pulse 70, temperature 97.9 F (36.6 C), resp. rate 16, height 5' 6.5" (1.689 m), weight 136 lb 12.8 oz (62.1 kg), SpO2 99%. Wt Readings from Last 3 Encounters:  09/13/22 136 lb 12.8 oz (62.1 kg)  07/08/22 135 lb (61.2 kg)  05/14/22 135 lb 12.8 oz (61.6 kg)   Body mass index is 21.75 kg/m. Performance status (ECOG): 0 - Asymptomatic  Physical Exam Constitutional:      Appearance: Normal appearance. She is not ill-appearing.  HENT:     Mouth/Throat:     Mouth: Mucous membranes are moist.     Pharynx: Oropharynx is clear. No oropharyngeal exudate or posterior oropharyngeal erythema.  Cardiovascular:     Rate and Rhythm: Normal rate and regular rhythm.     Heart sounds: No murmur heard.    No friction rub. No gallop.  Pulmonary:     Effort: Pulmonary effort is normal. No respiratory distress.     Breath sounds:  Normal breath sounds. No wheezing, rhonchi or rales.  Chest:  Breasts:    Right: No swelling, bleeding, inverted nipple, mass, nipple discharge or skin change.     Left: No swelling, bleeding, inverted nipple, mass, nipple discharge or skin change.  Abdominal:     General: Bowel sounds are normal. There is no distension.     Palpations: Abdomen is soft. There is no mass.     Tenderness: There is no abdominal tenderness.  Musculoskeletal:        General: No swelling or tenderness.     Cervical back: Normal range of motion and neck supple.     Right lower leg: No edema.     Left lower leg: No edema.  Lymphadenopathy:     Cervical: No cervical adenopathy.     Right cervical: No superficial, deep or posterior cervical adenopathy.    Left cervical: No superficial, deep or posterior cervical adenopathy.     Upper Body:     Right upper body: No supraclavicular or axillary adenopathy.     Left upper body: No supraclavicular or axillary adenopathy.     Lower Body: No right inguinal adenopathy. No left inguinal adenopathy.  Skin:    General: Skin is warm.     Coloration: Skin is not jaundiced.     Findings: No lesion or rash.  Neurological:     General: No focal deficit present.  Mental Status: She is alert and oriented to person, place, and time. Mental status is at baseline.  Psychiatric:        Mood and Affect: Mood normal.        Behavior: Behavior normal.        Thought Content: Thought content normal.        Judgment: Judgment normal.    LABS:   ASSESSMENT & PLAN:  Assessment/Plan:  A 72 y.o. woman who I follow for mild anemia.  I am pleased that her hemoglobin of 12.4 is even better than what it was 4 months ago.  As mentioned previously, no previous studies showed an obvious etiology behind her anemia.  As it is near normal she is clinically  doing well, her mild anemia will continue to be followed conservatively.  I will see her back in 6 months to reassess her mild anemia and perform her next clinical breast exam.  The patient understands all the plans discussed today and is in agreement with them.  Luretha Eberly Kirby Funk, MD

## 2022-09-13 ENCOUNTER — Other Ambulatory Visit: Payer: Self-pay | Admitting: Oncology

## 2022-09-13 ENCOUNTER — Inpatient Hospital Stay: Payer: PPO | Attending: Oncology | Admitting: Oncology

## 2022-09-13 ENCOUNTER — Inpatient Hospital Stay: Payer: PPO

## 2022-09-13 VITALS — BP 119/59 | HR 70 | Temp 97.9°F | Resp 16 | Ht 66.5 in | Wt 136.8 lb

## 2022-09-13 DIAGNOSIS — D649 Anemia, unspecified: Secondary | ICD-10-CM

## 2022-09-13 DIAGNOSIS — Z79899 Other long term (current) drug therapy: Secondary | ICD-10-CM | POA: Diagnosis not present

## 2022-09-13 DIAGNOSIS — C50212 Malignant neoplasm of upper-inner quadrant of left female breast: Secondary | ICD-10-CM | POA: Diagnosis not present

## 2022-09-13 LAB — CBC AND DIFFERENTIAL
HCT: 37 (ref 36–46)
Hemoglobin: 12.4 (ref 12.0–16.0)
Neutrophils Absolute: 2.54
Platelets: 237 10*3/uL (ref 150–400)
WBC: 4.8

## 2022-09-13 LAB — TSH: TSH: 1.639 u[IU]/mL (ref 0.350–4.500)

## 2022-09-13 LAB — CBC: RBC: 3.91 (ref 3.87–5.11)

## 2022-09-16 ENCOUNTER — Telehealth: Payer: Self-pay | Admitting: Oncology

## 2022-09-16 NOTE — Telephone Encounter (Signed)
09/16/22 Spoke with patient and scheduled next appt

## 2022-09-23 ENCOUNTER — Telehealth: Payer: PPO

## 2022-09-23 ENCOUNTER — Other Ambulatory Visit: Payer: PPO

## 2022-09-23 ENCOUNTER — Other Ambulatory Visit: Payer: Self-pay

## 2022-09-23 NOTE — Patient Outreach (Signed)
Care Management   Visit Note  09/23/2022 Name: Audrey Ruiz MRN: [id number] DOB: 31-Dec-1950  Subjective: Norval Gable [place]etter [place]och is a 72 y.o. year old female who is a primary care patient of Cox, Kirsten, MD. The Care Management team was consulted for assistance.      Engaged with patient spoke with patient by telephone.    Goals Addressed             This Visit's Progress    RNCM Care Management Expected Outcome:  Monitor, Self-Manage and Reduce Symptoms of Heart Failure       Current Barriers:  Chronic Disease Management support and education needs related to effective management of HF Wt Readings from Last 3 Encounters:  09/13/22 136 lb 12.8 oz (62.1 kg)  07/08/22 135 lb (61.2 kg)  05/14/22 135 lb 12.8 oz (61.6 kg)     Planned Interventions: Basic overview and discussion of pathophysiology of Heart Failure reviewed. The patient has a good understanding of her cardiac conditions and follows up as directed. Blood pressures are stable. Weight is stable. Her cardiac conditions are stable.  She sees the specialist on a regular basis. Denies any acute changes at this time. Review of when to call the provider for changes. Also encouraged the patient to write down questions to ask the providers when she has appointments.  Provided education on low sodium diet. States her appetite is good, denies any issues with heart healthy diet, monitors sugar content as well Reviewed Heart Failure Action Plan in depth and provided written copy Assessed need for readable accurate scales in home Provided education about placing scale on hard, flat surface Advised patient to weigh each morning after emptying bladder Discussed importance of daily weight and advised patient to weigh and record daily. The patients weight is stable. Denies any acute fluctuations in her weight. Reviewed role of diuretics in prevention of fluid overload and management of heart failure Medication management: The  patient takes Entresto 24/26 mg as directed. Denies any issues with medication cost or obtaining medications. Will continue to monitor for new needs or concerns.  Discussed the importance of keeping all appointments with provider. The patient is compliant with appointments.The patients next appointment with the pcp is 01-02-2023. She will see the LPN for AWV in September. Knows to call for changes or new needs between visits.  Provided patient with education about the role of exercise in the management of heart failure Advised patient to discuss changes in HF, questions or concerns about heart health with provider Screening for signs and symptoms of depression related to chronic disease state  Assessed social determinant of health barriers EF 50-55% 06-06-2021 Pacemaker placement in early 2023  Symptom Management: Take medications as prescribed   Attend all scheduled provider appointments Call provider office for new concerns or questions  call the Suicide and Crisis Lifeline: 988 call the [place] National Suicide Prevention Lifeline: 432-290-8101 or TTY: 781 043 7703 TTY (352)158-3570) to talk to a trained counselor call 1-800-273-TALK (toll free, 24 hour hotline) if experiencing a Mental Health or Behavioral Health Crisis  call office if I gain more than 2 pounds in one day or 5 pounds in one week use salt in moderation watch for swelling in feet, ankles and legs every day weigh myself daily develop a rescue plan follow rescue plan if symptoms flare-up track symptoms and what helps feel better or worse dress right for the weather, hot or cold  Follow Up Plan: Telephone follow up appointment with care  management team member scheduled for: 12-09-2022 at 1145 am       RNCM Care Management Expected Outcome:  Monitor, Self-Manage and Reduce Symptoms of:HLD       Current Barriers:  Chronic Disease Management support and education needs related to HLD Caregiver stress: The patient assist and  cares for her elderly parents and several others Lab Results  Component Value Date   CHOL 132 07/05/2022   HDL 74 07/05/2022   LDLCALC 45 07/05/2022   TRIG 63 07/05/2022   CHOLHDL 1.8 07/05/2022     Planned Interventions: Provider established cholesterol goals reviewed. Review of most recent lab work. The patient is at goal. . The patient has started on Synthroid and she states that she can tell a positive difference in her energy level and how she feels. She ask about doubling her Synthroid since she feels well. Advised the patient not to change her dosage of Synthroid. Education provided on discussing medication changes with the pcp and the pcp would do labs to check her levels. The patient verbalized understanding.  She states that she is doing well for the most part. Her Father passed away on 07-04-24and he was 74 years old. She says this is hard but he had a good life and him and her mother were married for 74 years. Reflective listening and support.  She will follow up with the pcp for AWV in September and November.   Counseled on importance of regular laboratory monitoring as prescribed. Review and education provided. Has labs on a regular basis Provided HLD educational materials; Reviewed role and benefits of statin for ASCVD risk reduction; Discussed strategies to manage statin-induced myalgias. The patient takes Zetia 10 mg daily, Crestor 40 mg at night, and Fish Oil 1000 mg BID. She denies any issues with current medications regimen. ; Reviewed importance of limiting foods high in cholesterol. Review of heart healthy/ADA diet. ; Reviewed exercise goals and target of 150 minutes per week; Screening for signs and symptoms of depression related to chronic disease state;  Assessed social determinant of health barriers;  Discussed resources available to help with needs as they arise. Discussed self care and making sure she carves out time for self care and her own needs. The patient  assist with the care of her elderly mother, mother in law, and husband. Also helps with the care of elderly aunt and recently moving an aunt to the [place] area. Discussed making sure she is monitoring her stress level and health and well being. The patient recently started Levothyroxine 25 mcg for an elevated Thyroid level of 4.845 on 04-02-2022. She states the synthroid is working well for her and she can tell a positive difference. The patient is experiencing more cramping in her legs. Review of potassium and sodium levels. They are stable. Review of elevation of legs when sitting and use of compression socks to help with circulation and to see if this helps with the "cramps" she is experiencing. Encouraged the patient to write down questions to ask the pcp at her visits. The patient does not take gabapentin and this may be something she would want to talk to the pcp about at her upcoming visit. Education provided on the AVS.   Symptom Management: Take medications as prescribed   Attend all scheduled provider appointments Call provider office for new concerns or questions  call the Suicide and Crisis Lifeline: 988 call the [place] National Suicide Prevention Lifeline: (830)259-2014 or TTY: (732) 221-9377 TTY 204-653-7505) to talk to  a trained counselor call 1-800-273-TALK (toll free, 24 hour hotline) if experiencing a Mental Health or Behavioral Health Crisis  - call for medicine refill 2 or 3 days before it runs out - take all medications exactly as prescribed - call doctor with any symptoms you believe are related to your medicine - call doctor when you experience any new symptoms - go to all doctor appointments as scheduled - adhere to prescribed diet: heart healthy diet  Follow Up Plan: Telephone follow up appointment with care management team member scheduled for: 12-09-2022 at 1145 am           Consent to Services:  Patient was given information about care management services, agreed  to services, and gave verbal consent to participate.   Plan: Telephone follow up appointment with care management team member scheduled for: 12-09-2022 at 1145 am  Alto Denver RN, MSN, CCM RN Care Manager  [facility]  Ambulatory Care Management  Direct Number: [phone]

## 2022-09-23 NOTE — Patient Instructions (Signed)
Visit Information  Thank you for taking time to visit with me today. Please don't hesitate to contact me if I can be of assistance to you before our next scheduled telephone appointment.  Following are the goals we discussed today:   Goals Addressed             This Visit's Progress    RNCM Care Management Expected Outcome:  Monitor, Self-Manage and Reduce Symptoms of Heart Failure       Current Barriers:  Chronic Disease Management support and education needs related to effective management of HF Wt Readings from Last 3 Encounters:  09/13/22 136 lb 12.8 oz (62.1 kg)  07/08/22 135 lb (61.2 kg)  05/14/22 135 lb 12.8 oz (61.6 kg)     Planned Interventions: Basic overview and discussion of pathophysiology of Heart Failure reviewed. The patient has a good understanding of her cardiac conditions and follows up as directed. Blood pressures are stable. Weight is stable. Her cardiac conditions are stable.  She sees the specialist on a regular basis. Denies any acute changes at this time. Review of when to call the provider for changes. Also encouraged the patient to write down questions to ask the providers when she has appointments.  Provided education on low sodium diet. States her appetite is good, denies any issues with heart healthy diet, monitors sugar content as well Reviewed Heart Failure Action Plan in depth and provided written copy Assessed need for readable accurate scales in home Provided education about placing scale on hard, flat surface Advised patient to weigh each morning after emptying bladder Discussed importance of daily weight and advised patient to weigh and record daily. The patients weight is stable. Denies any acute fluctuations in her weight. Reviewed role of diuretics in prevention of fluid overload and management of heart failure Medication management: The patient takes Entresto 24/26 mg as directed. Denies any issues with medication cost or obtaining medications.  Will continue to monitor for new needs or concerns.  Discussed the importance of keeping all appointments with provider. The patient is compliant with appointments.The patients next appointment with the pcp is 01-02-2023. She will see the LPN for AWV in September. Knows to call for changes or new needs between visits.  Provided patient with education about the role of exercise in the management of heart failure Advised patient to discuss changes in HF, questions or concerns about heart health with provider Screening for signs and symptoms of depression related to chronic disease state  Assessed social determinant of health barriers EF 50-55% 06-06-2021 Pacemaker placement in early 2023  Symptom Management: Take medications as prescribed   Attend all scheduled provider appointments Call provider office for new concerns or questions  call the Suicide and Crisis Lifeline: 988 call the [place] National Suicide Prevention Lifeline: (762)843-8039 or TTY: (301)302-5138 TTY 901-850-2212) to talk to a trained counselor call 1-800-273-TALK (toll free, 24 hour hotline) if experiencing a Mental Health or Behavioral Health Crisis  call office if I gain more than 2 pounds in one day or 5 pounds in one week use salt in moderation watch for swelling in feet, ankles and legs every day weigh myself daily develop a rescue plan follow rescue plan if symptoms flare-up track symptoms and what helps feel better or worse dress right for the weather, hot or cold  Follow Up Plan: Telephone follow up appointment with care management team member scheduled for: 12-09-2022 at 1145 am       RNCM Care Management Expected Outcome:  Monitor, Self-Manage and Reduce Symptoms of:HLD       Current Barriers:  Chronic Disease Management support and education needs related to HLD Caregiver stress: The patient assist and cares for her elderly parents and several others Lab Results  Component Value Date   CHOL 132 07/05/2022    HDL 74 07/05/2022   LDLCALC 45 07/05/2022   TRIG 63 07/05/2022   CHOLHDL 1.8 07/05/2022     Planned Interventions: Provider established cholesterol goals reviewed. Review of most recent lab work. The patient is at goal. . The patient has started on Synthroid and she states that she can tell a positive difference in her energy level and how she feels. She ask about doubling her Synthroid since she feels well. Advised the patient not to change her dosage of Synthroid. Education provided on discussing medication changes with the pcp and the pcp would do labs to check her levels. The patient verbalized understanding.  She states that she is doing well for the most part. Her Father passed away on 07/07/2024and he was 74 years old. She says this is hard but he had a good life and him and her mother were married for 74 years. Reflective listening and support.  She will follow up with the pcp for AWV in September and November.   Counseled on importance of regular laboratory monitoring as prescribed. Review and education provided. Has labs on a regular basis Provided HLD educational materials; Reviewed role and benefits of statin for ASCVD risk reduction; Discussed strategies to manage statin-induced myalgias. The patient takes Zetia 10 mg daily, Crestor 40 mg at night, and Fish Oil 1000 mg BID. She denies any issues with current medications regimen. ; Reviewed importance of limiting foods high in cholesterol. Review of heart healthy/ADA diet. ; Reviewed exercise goals and target of 150 minutes per week; Screening for signs and symptoms of depression related to chronic disease state;  Assessed social determinant of health barriers;  Discussed resources available to help with needs as they arise. Discussed self care and making sure she carves out time for self care and her own needs. The patient assist with the care of her elderly mother, mother in law, and husband. Also helps with the care of elderly aunt  and recently moving an aunt to the [place] area. Discussed making sure she is monitoring her stress level and health and well being. The patient recently started Levothyroxine 25 mcg for an elevated Thyroid level of 4.845 on 04-02-2022. She states the synthroid is working well for her and she can tell a positive difference. The patient is experiencing more cramping in her legs. Review of potassium and sodium levels. They are stable. Review of elevation of legs when sitting and use of compression socks to help with circulation and to see if this helps with the "cramps" she is experiencing. Encouraged the patient to write down questions to ask the pcp at her visits. The patient does not take gabapentin and this may be something she would want to talk to the pcp about at her upcoming visit. Education provided on the AVS.   Symptom Management: Take medications as prescribed   Attend all scheduled provider appointments Call provider office for new concerns or questions  call the Suicide and Crisis Lifeline: 988 call the [place] National Suicide Prevention Lifeline: 707-131-9168 or TTY: 3371046087 TTY (915)828-3391) to talk to a trained counselor call 1-800-273-TALK (toll free, 24 hour hotline) if experiencing a Mental Health or Behavioral Health Crisis  -  call for medicine refill 2 or 3 days before it runs out - take all medications exactly as prescribed - call doctor with any symptoms you believe are related to your medicine - call doctor when you experience any new symptoms - go to all doctor appointments as scheduled - adhere to prescribed diet: heart healthy diet  Follow Up Plan: Telephone follow up appointment with care management team member scheduled for: 12-09-2022 at 1145 am           Our next appointment is by telephone on 12-09-2022 at 1145 am  Please call the care guide team at [phone] if you need to cancel or reschedule your appointment.   If you are experiencing a Mental  Health or Behavioral Health Crisis or need someone to talk to, please call the Suicide and Crisis Lifeline: 988 call the [place] National Suicide Prevention Lifeline: (986)225-3443 or TTY: 669-384-9700 TTY 2396803663) to talk to a trained counselor call 1-800-273-TALK (toll free, 24 hour hotline) go to [facility] [address], [place] [phone])   Patient verbalizes understanding of instructions and care plan provided today and agrees to view in MyChart. Active MyChart status and patient understanding of how to access instructions and care plan via MyChart confirmed with patient.       Alto Denver RN, MSN, CCM RN Care Manager  [facility]  Ambulatory Care Management  Direct Number: [phone]   Peripheral Neuropathy Peripheral neuropathy is a type of nerve damage. It affects nerves that carry signals between the spinal cord and the arms, legs, and the rest of the body (peripheral nerves). It does not affect nerves in the spinal cord or brain. In peripheral neuropathy, one nerve or a group of nerves may be damaged. Peripheral neuropathy is a broad category that includes many specific nerve disorders, like diabetic neuropathy, hereditary neuropathy, and carpal tunnel syndrome. What are the causes? This condition may be caused by: Certain diseases, such as: Diabetes. This is the most common cause of peripheral neuropathy. Autoimmune diseases, such as rheumatoid arthritis and systemic lupus erythematosus. Nerve diseases that are passed from parent to child (inherited). Kidney disease. Thyroid disease. Other causes may include: Nerve injury. Pressure or stress on a nerve that lasts a long time. Lack (deficiency) of B vitamins. This can result from alcoholism, poor diet, or a restricted diet. Infections. Some medicines, such as cancer medicines (chemotherapy). Poisonous (toxic) substances, such as lead and mercury. Too little blood flowing to  the legs. In some cases, the cause of this condition is not known. What are the signs or symptoms? Symptoms of this condition depend on which of your nerves is damaged. Symptoms in the legs, hands, and arms can include: Loss of feeling (numbness) in the feet, hands, or both. Tingling in the feet, hands, or both. Burning pain. Very sensitive skin. Weakness. Not being able to move a part of the body (paralysis). Clumsiness or poor coordination. Muscle twitching. Loss of balance. Symptoms in other parts of the body can include: Not being able to control your bladder. Feeling dizzy. Sexual problems. How is this diagnosed? Diagnosing and finding the cause of peripheral neuropathy can be difficult. Your health care provider will take your medical history and do a physical exam. A neurological exam will also be done. This involves checking things that are affected by your brain, spinal cord, and nerves (nervous system). For example, your health care provider will check your reflexes, how you move, and what you can feel. You  may have other tests, such as: Blood tests. Electromyogram (EMG) and nerve conduction tests. These tests check nerve function and how well the nerves are controlling the muscles. Imaging tests, such as a CT scan or MRI, to rule out other causes of your symptoms. Removing a small piece of nerve to be examined in a lab (nerve biopsy). Removing and examining a small amount of the fluid that surrounds the brain and spinal cord (lumbar puncture). How is this treated? Treatment for this condition may involve: Treating the underlying cause of the neuropathy, such as diabetes, kidney disease, or vitamin deficiencies. Stopping medicines that can cause neuropathy, such as chemotherapy. Medicine to help relieve pain. Medicines may include: Prescription or over-the-counter pain medicine. Anti-seizure medicine. Antidepressants. Pain-relieving patches that are applied to painful  areas of skin. Surgery to relieve pressure on a nerve or to destroy a nerve that is causing pain. Physical therapy to help improve movement and balance. Devices to help you move around (assistive devices). Follow these instructions at home: Medicines Take over-the-counter and prescription medicines only as told by your health care provider. Do not take any other medicines without first asking your health care provider. Ask your health care provider if the medicine prescribed to you requires you to avoid driving or using machinery. Lifestyle  Do not use any products that contain nicotine or tobacco. These products include cigarettes, chewing tobacco, and vaping devices, such as e-cigarettes. Smoking keeps blood from reaching damaged nerves. If you need help quitting, ask your health care provider. Avoid or limit alcohol. Too much alcohol can cause a vitamin B deficiency, and vitamin B is needed for healthy nerves. Eat a healthy diet. This includes: Eating foods that are high in fiber, such as beans, whole grains, and fresh fruits and vegetables. Limiting foods that are high in fat and processed sugars, such as fried or sweet foods. General instructions  If you have diabetes, work closely with your health care provider to keep your blood sugar under control. If you have numbness in your feet: Check every day for signs of injury or infection. Watch for redness, warmth, and swelling. Wear padded socks and comfortable shoes. These help protect your feet. Develop a good support system. Living with peripheral neuropathy can be stressful. Consider talking with a mental health specialist or joining a support group. Use assistive devices and attend physical therapy as told by your health care provider. This may include using a walker or a cane. Keep all follow-up visits. This is important. Where to find more information [employer] of Neurological Disorders: [email or web address] Contact a health  care provider if: You have new signs or symptoms of peripheral neuropathy. You are struggling emotionally from dealing with peripheral neuropathy. Your pain is not well controlled. Get help right away if: You have an injury or infection that is not healing normally. You develop new weakness in an arm or leg. You have fallen or do so frequently. Summary Peripheral neuropathy is when the nerves in the arms or legs are damaged, resulting in numbness, weakness, or pain. There are many causes of peripheral neuropathy, including diabetes, pinched nerves, vitamin deficiencies, autoimmune disease, and hereditary conditions. Diagnosing and finding the cause of peripheral neuropathy can be difficult. Your health care provider will take your medical history, do a physical exam, and do tests, including blood tests and nerve function tests. Treatment involves treating the underlying cause of the neuropathy and taking medicines to help control pain. Physical therapy and assistive devices may also help.  This information is not intended to replace advice given to you by your health care provider. Make sure you discuss any questions you have with your health care provider. Document Revised: 10/10/2020 Document Reviewed: 10/10/2020 Elsevier Patient Education  2024 [employer].

## 2022-09-30 ENCOUNTER — Other Ambulatory Visit: Payer: Self-pay | Admitting: Family Medicine

## 2022-09-30 DIAGNOSIS — E038 Other specified hypothyroidism: Secondary | ICD-10-CM

## 2022-09-30 DIAGNOSIS — K219 Gastro-esophageal reflux disease without esophagitis: Secondary | ICD-10-CM

## 2022-10-03 DIAGNOSIS — D2239 Melanocytic nevi of other parts of face: Secondary | ICD-10-CM | POA: Diagnosis not present

## 2022-10-03 DIAGNOSIS — L814 Other melanin hyperpigmentation: Secondary | ICD-10-CM | POA: Diagnosis not present

## 2022-10-03 DIAGNOSIS — D485 Neoplasm of uncertain behavior of skin: Secondary | ICD-10-CM | POA: Diagnosis not present

## 2022-10-03 DIAGNOSIS — L821 Other seborrheic keratosis: Secondary | ICD-10-CM | POA: Diagnosis not present

## 2022-10-03 DIAGNOSIS — D225 Melanocytic nevi of trunk: Secondary | ICD-10-CM | POA: Diagnosis not present

## 2022-10-15 ENCOUNTER — Other Ambulatory Visit: Payer: Self-pay | Admitting: Family Medicine

## 2022-10-15 DIAGNOSIS — E038 Other specified hypothyroidism: Secondary | ICD-10-CM

## 2022-10-15 DIAGNOSIS — K219 Gastro-esophageal reflux disease without esophagitis: Secondary | ICD-10-CM

## 2022-10-18 ENCOUNTER — Other Ambulatory Visit (HOSPITAL_COMMUNITY): Payer: Self-pay

## 2022-10-21 ENCOUNTER — Other Ambulatory Visit: Payer: Self-pay | Admitting: Cardiology

## 2022-11-04 ENCOUNTER — Ambulatory Visit (INDEPENDENT_AMBULATORY_CARE_PROVIDER_SITE_OTHER): Payer: PPO

## 2022-11-04 VITALS — BP 120/68 | HR 72 | Resp 16 | Ht 66.5 in | Wt 138.4 lb

## 2022-11-04 DIAGNOSIS — Z0000 Encounter for general adult medical examination without abnormal findings: Secondary | ICD-10-CM

## 2022-11-04 DIAGNOSIS — C44319 Basal cell carcinoma of skin of other parts of face: Secondary | ICD-10-CM | POA: Diagnosis not present

## 2022-11-04 NOTE — Patient Instructions (Signed)
Audrey Ruiz , Thank you for taking time to come for your Medicare Wellness Visit. I appreciate your ongoing commitment to your health goals. Please review the following plan we discussed and let me know if I can assist you in the future.   These are the goals we discussed:  Goals      Prevent falls     11/04/2022 AWV Goal: Fall Prevention  Over the next year, patient will decrease their risk for falls by: Using assistive devices, such as a cane or walker, as needed Identifying fall risks within their home and correcting them by: Removing throw rugs Adding handrails to stairs or ramps Removing clutter and keeping a clear pathway throughout the home Increasing light, especially at night Adding shower handles/bars Raising toilet seat Identifying potential personal risk factors for falls: Medication side effects Incontinence/urgency Vestibular dysfunction Hearing loss Musculoskeletal disorders Neurological disorders Orthostatic hypotension        This is a list of the screening recommended for you and due dates:  Health Maintenance  Topic Date Due   Zoster (Shingles) Vaccine (1 of 2) Never done   Flu Shot  09/19/2022   COVID-19 Vaccine (5 - 2023-24 season) 10/20/2022   DTaP/Tdap/Td vaccine (2 - Td or Tdap) 12/01/2022   Hemoglobin A1C  01/05/2023   Yearly kidney function blood test for diabetes  07/05/2023   Medicare Annual Wellness Visit  11/04/2023   Mammogram  06/27/2024   Colon Cancer Screening  06/22/2026   Pneumonia Vaccine  Completed   DEXA scan (bone density measurement)  Completed   Hepatitis C Screening  Completed   HPV Vaccine  Aged Out    Preventive Care 98 Years and Older, Female Preventive care refers to lifestyle choices and visits with your health care provider that can promote health and wellness. What does preventive care include? A yearly physical exam. This is also called an annual well check. Dental exams once or twice a year. Routine eye exams.  Ask your health care provider how often you should have your eyes checked. Personal lifestyle choices, including: Daily care of your teeth and gums. Regular physical activity. Eating a healthy diet. Avoiding tobacco and drug use. Limiting alcohol use. Practicing safe sex. Taking low-dose aspirin every day. Taking vitamin and mineral supplements as recommended by your health care provider. What happens during an annual well check? The services and screenings done by your health care provider during your annual well check will depend on your age, overall health, lifestyle risk factors, and family history of disease. Counseling  Your health care provider may ask you questions about your: Alcohol use. Tobacco use. Drug use. Emotional well-being. Home and relationship well-being. Sexual activity. Eating habits. History of falls. Memory and ability to understand (cognition). Work and work Astronomer. Reproductive health. Screening  You may have the following tests or measurements: Height, weight, and BMI. Blood pressure. Lipid and cholesterol levels. These may be checked every 5 years, or more frequently if you are over 65 years old. Skin check. Lung cancer screening. You may have this screening every year starting at age 46 if you have a 30-pack-year history of smoking and currently smoke or have quit within the past 15 years. Fecal occult blood test (FOBT) of the stool. You may have this test every year starting at age 33. Flexible sigmoidoscopy or colonoscopy. You may have a sigmoidoscopy every 5 years or a colonoscopy every 10 years starting at age 59. Hepatitis C blood test. Hepatitis B blood test. Sexually  transmitted disease (STD) testing. Diabetes screening. This is done by checking your blood sugar (glucose) after you have not eaten for a while (fasting). You may have this done every 1-3 years. Bone density scan. This is done to screen for osteoporosis. You may have this  done starting at age 36. Mammogram. This may be done every 1-2 years. Talk to your health care provider about how often you should have regular mammograms. Talk with your health care provider about your test results, treatment options, and if necessary, the need for more tests. Vaccines  Your health care provider may recommend certain vaccines, such as: Influenza vaccine. This is recommended every year. Tetanus, diphtheria, and acellular pertussis (Tdap, Td) vaccine. You may need a Td booster every 10 years. Zoster vaccine. You may need this after age 58. Pneumococcal 13-valent conjugate (PCV13) vaccine. One dose is recommended after age 70. Pneumococcal polysaccharide (PPSV23) vaccine. One dose is recommended after age 52. Talk to your health care provider about which screenings and vaccines you need and how often you need them. This information is not intended to replace advice given to you by your health care provider. Make sure you discuss any questions you have with your health care provider. Document Released: 03/03/2015 Document Revised: 10/25/2015 Document Reviewed: 12/06/2014 Elsevier Interactive Patient Education  2017 [employer].  Fall Prevention in the Home Falls can cause injuries. They can happen to people of all ages. There are many things you can do to make your home safe and to help prevent falls. What can I do on the outside of my home? Regularly fix the edges of walkways and driveways and fix any cracks. Remove anything that might make you trip as you walk through a door, such as a raised step or threshold. Trim any bushes or trees on the path to your home. Use bright outdoor lighting. Clear any walking paths of anything that might make someone trip, such as rocks or tools. Regularly check to see if handrails are loose or broken. Make sure that both sides of any steps have handrails. Any raised decks and porches should have guardrails on the edges. Have any leaves,  snow, or ice cleared regularly. Use sand or salt on walking paths during winter. Clean up any spills in your garage right away. This includes oil or grease spills. What can I do in the bathroom? Use night lights. Install grab bars by the toilet and in the tub and shower. Do not use towel bars as grab bars. Use non-skid mats or decals in the tub or shower. If you need to sit down in the shower, use a plastic, non-slip stool. Keep the floor dry. Clean up any water that spills on the floor as soon as it happens. Remove soap buildup in the tub or shower regularly. Attach bath mats securely with double-sided non-slip rug tape. Do not have throw rugs and other things on the floor that can make you trip. What can I do in the bedroom? Use night lights. Make sure that you have a light by your bed that is easy to reach. Do not use any sheets or blankets that are too big for your bed. They should not hang down onto the floor. Have a firm chair that has side arms. You can use this for support while you get dressed. Do not have throw rugs and other things on the floor that can make you trip. What can I do in the kitchen? Clean up any spills right away. Avoid  walking on wet floors. Keep items that you use a lot in easy-to-reach places. If you need to reach something above you, use a strong step stool that has a grab bar. Keep electrical cords out of the way. Do not use floor polish or wax that makes floors slippery. If you must use wax, use non-skid floor wax. Do not have throw rugs and other things on the floor that can make you trip. What can I do with my stairs? Do not leave any items on the stairs. Make sure that there are handrails on both sides of the stairs and use them. Fix handrails that are broken or loose. Make sure that handrails are as long as the stairways. Check any carpeting to make sure that it is firmly attached to the stairs. Fix any carpet that is loose or worn. Avoid having throw  rugs at the top or bottom of the stairs. If you do have throw rugs, attach them to the floor with carpet tape. Make sure that you have a light switch at the top of the stairs and the bottom of the stairs. If you do not have them, ask someone to add them for you. What else can I do to help prevent falls? Wear shoes that: Do not have high heels. Have rubber bottoms. Are comfortable and fit you well. Are closed at the toe. Do not wear sandals. If you use a stepladder: Make sure that it is fully opened. Do not climb a closed stepladder. Make sure that both sides of the stepladder are locked into place. Ask someone to hold it for you, if possible. Clearly mark and make sure that you can see: Any grab bars or handrails. First and last steps. Where the edge of each step is. Use tools that help you move around (mobility aids) if they are needed. These include: Canes. Walkers. Scooters. Crutches. Turn on the lights when you go into a dark area. Replace any light bulbs as soon as they burn out. Set up your furniture so you have a clear path. Avoid moving your furniture around. If any of your floors are uneven, fix them. If there are any pets around you, be aware of where they are. Review your medicines with your doctor. Some medicines can make you feel dizzy. This can increase your chance of falling. Ask your doctor what other things that you can do to help prevent falls. This information is not intended to replace advice given to you by your health care provider. Make sure you discuss any questions you have with your health care provider. Document Released: 12/01/2008 Document Revised: 07/13/2015 Document Reviewed: 03/11/2014 Elsevier Interactive Patient Education  2017 [employer].

## 2022-11-04 NOTE — Progress Notes (Signed)
Subjective:   Audrey Ruiz is a 72 y.o. female who presents for Medicare Annual (Subsequent) preventive examination.  This wellness visit is conducted by a nurse.  The patient's medications were reviewed and reconciled since the patient's last visit.  History details were provided by the patient.  The history appears to be reliable.    Medical History: Patient history and Family history was reviewed  Medications, Allergies, and preventative health maintenance was reviewed and updated.   Visit Complete: In person  Cardiac Risk Factors include: advanced age (>48men, >52 women)     Objective:    Today's Vitals   11/04/22 1424  BP: 120/68  Pulse: 72  Resp: 16  SpO2: 98%  Weight: 138 lb 6.4 oz (62.8 kg)  Height: 5' 6.5" (1.689 m)  PainSc: 0-No pain   Body mass index is 22 kg/m.     05/14/2022   10:49 AM 04/02/2022   11:39 AM 02/05/2021   10:42 AM 10/26/2020   10:47 AM 08/08/2020    8:40 AM  Advanced Directives  Does Patient Have a Medical Advance Directive? Yes No Yes Yes Yes  Type of [employer] Power of [place];Living will Healthcare Power of Attorney  Does patient want to make changes to medical advance directive?   No - Patient declined No - Patient declined   Copy of Healthcare Power of Attorney in Chart?   No - copy requested No - copy requested     Current Medications (verified) Outpatient Encounter Medications as of 11/04/2022  Medication Sig   cetirizine (ZYRTEC) 10 MG tablet Take 10 mg by mouth daily.   ENTRESTO 24-26 MG TAKE ONE TABLET BY MOUTH EVERY MORNING and TAKE ONE TABLET BY MOUTH EVERYDAY AT BEDTIME   ezetimibe (ZETIA) 10 MG tablet TAKE 1 TABLET BY MOUTH EVERY DAY AT BEDTIME   FLUoxetine (PROZAC) 40 MG capsule TAKE 1 CAPSULE BY MOUTH ONCE DAILY *REFILL REQUEST*   fluticasone (FLONASE) 50 MCG/ACT nasal spray USE TWO SPRAYS IN EACH NOSTRIL ONCE DAILY   Glucosamine-Chondroitin-MSM 1500-1200-500 MG  PACK Take 1 tablet by mouth daily.   HYDROcodone bit-homatropine (HYDROMET) 5-1.5 MG/5ML syrup Take 5 mLs by mouth 2 (two) times daily as needed for cough.   ibuprofen (ADVIL) 200 MG tablet Take 200 mg by mouth every 8 (eight) hours as needed for moderate pain.   levothyroxine (SYNTHROID) 25 MCG tablet TAKE 1 TABLET BY MOUTH DAILY BEFORE BREAKFAST *REFILL REQUEST*   LORazepam (ATIVAN) 0.5 MG tablet Take 1 tablet (0.5 mg total) by mouth at bedtime as needed for anxiety.   Melatonin 10 MG CAPS Take 5 mg by mouth at bedtime.   Multiple Vitamin (MULTIVITAMIN) capsule Take 1 capsule by mouth daily.   Omega-3 Fatty Acids (FISH OIL) 1000 MG CAPS Take 1,000 mg by mouth 2 (two) times daily.   omeprazole (PRILOSEC) 20 MG capsule TAKE 1 CAPSULE BY MOUTH ONCE DAILY *REFILL REQUEST*   rosuvastatin (CRESTOR) 40 MG tablet TAKE 1 TABLET BY MOUTH EVERY DAY AT BEDTIME   Vitamin D, Cholecalciferol, 25 MCG (1000 UT) CAPS Take 1,000 Units by mouth daily.   No facility-administered encounter medications on file as of 11/04/2022.    Allergies (verified) Patient has no known allergies.   History: Past Medical History:  Diagnosis Date   Abnormal nuclear stress test 08/08/2020   Abnormal nuclear stress test 08/08/2020   Anxiety    Closed fracture of distal end of left radius 08/16/2020  Depressed left ventricular ejection fraction    Depression    Diabetes mellitus without complication (HCC)    Dilated cardiomyopathy (HCC) 08/08/2020   Encounter for Medicare annual wellness exam 10/19/2019   Gastroesophageal reflux disease 10/19/2019   GERD (gastroesophageal reflux disease)    Hydropneumothorax 02/08/2021   Hyperlipidemia    Hyperlipidemia associated with type 2 diabetes mellitus (HCC) 10/19/2019   LBBB (left bundle branch block) 08/17/2020   Mitral valve prolapse    Nonischemic cardiomyopathy (HCC) 08/17/2020   Osteopenia 10/19/2019   Sinusitis 08/16/2019   Upper respiratory tract infection due to  COVID-19 virus 03/18/2021   Past Surgical History:  Procedure Laterality Date   BIV ICD INSERTION CRT-D N/A 02/05/2021   Procedure: BIV ICD INSERTION CRT-D;  Surgeon: Regan Lemming, MD;  Location: [facility] INVASIVE CV LAB;  Service: Cardiovascular;  Laterality: N/A;   BREAST LUMPECTOMY Left 2010   CATARACT EXTRACTION BILATERAL W/ ANTERIOR VITRECTOMY  2021   EYE SURGERY Bilateral    Cataract Removal   LEFT HEART CATH AND CORONARY ANGIOGRAPHY N/A 08/08/2020   Procedure: LEFT HEART CATH AND CORONARY ANGIOGRAPHY;  Surgeon: Marykay Lex, MD;  Location: [facility] INVASIVE CV LAB;  Service: Cardiovascular;  Laterality: N/A;   Family History  Problem Relation Age of Onset   Osteoporosis Mother    Heart disease Father    Hyperlipidemia Father    Coronary artery disease Father    Diabetes Sister    Breast cancer Daughter    Diabetes Maternal Grandfather    Social History   Socioeconomic History   Marital status: Married    Spouse name: Viviann Spare   Number of children: 2   Years of education: Not on file   Highest education level: Master's degree (e.g., MA, MS, MEng, MEd, MSW, MBA)  Occupational History   Occupation: Retired  Tobacco Use   Smoking status: Never   Smokeless tobacco: Never  Vaping Use   Vaping status: Never Used  Substance and Sexual Activity   Alcohol use: Yes    Alcohol/week: 4.0 standard drinks of alcohol    Types: 4 Glasses of wine per week    Comment: occasional (white wine)   Drug use: Never   Sexual activity: Not Currently  Other Topics Concern   Not on file  Social History Narrative   Son lives in [place], Daughter lives in [place]   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   Social Determinants of Health   Financial Resource Strain: Low Risk  (11/04/2022)   Overall Financial Resource Strain (CARDIA)    Difficulty of Paying Living Expenses: Not very hard  Food Insecurity: No Food Insecurity (11/04/2022)   Hunger Vital Sign    Worried About  Running Out of Food in the Last Year: Never true    Ran Out of Food in the Last Year: Never true  Transportation Needs: No Transportation Needs (11/04/2022)   PRAPARE - Administrator, Civil Service (Medical): No    Lack of Transportation (Non-Medical): No  Physical Activity: Sufficiently Active (11/04/2022)   Exercise Vital Sign    Days of Exercise per Week: 6 days    Minutes of Exercise per Session: 30 min  Stress: No Stress Concern Present (11/04/2022)   [employer] of Occupational Health - Occupational Stress Questionnaire    Feeling of Stress : Not at all  Social Connections: Socially Integrated (11/04/2022)   Social Connection and Isolation Panel [NHANES]    Frequency of Communication with  Friends and Family: More than three times a week    Frequency of Social Gatherings with Friends and Family: More than three times a week    Attends Religious Services: More than 4 times per year    Active Member of [employer] or Organizations: Yes    Attends Engineer, structural: More than 4 times per year    Marital Status: Married    Tobacco Counseling Counseling given: Not Answered   Clinical Intake:  Pre-visit preparation completed: Yes Pain : No/denies pain Pain Score: 0-No pain   BMI - recorded: 22 Nutritional Status: BMI of 19-24  Normal Nutritional Risks: None Diabetes: Yes CBG done?: No Did pt. bring in CBG monitor from home?: No How often do you need to have someone help you when you read instructions, pamphlets, or other written materials from your doctor or pharmacy?: 1 - Never What is the last grade level you completed in school?: 12 + college Interpreter Needed?: No    Activities of Daily Living    11/04/2022    1:50 PM 01/23/2022   10:32 AM  In your present state of health, do you have any difficulty performing the following activities:  Hearing? 0 0  Vision? 0 0  Difficulty concentrating or making decisions? 0 0  Walking or climbing stairs? 0  0  Dressing or bathing? 0 0  Doing errands, shopping? 0 0  Preparing Food and eating ? N N  Using the Toilet? N N  In the past six months, have you accidently leaked urine? N N  Do you have problems with loss of bowel control? N N  Managing your Medications? N N  Managing your Finances? N N  Housekeeping or managing your Housekeeping? N N    Patient Care Team: CoxFritzi Mandes, MD as PCP - General (Family Medicine) Thomasene Ripple, DO as PCP - Cardiology (Cardiology) Regan Lemming, MD as PCP - Electrophysiology (Cardiology) Weston Settle, MD as Consulting Physician (Oncology) Thomasene Ripple, DO as Consulting Physician (Cardiology) Karma Greaser, PA as Physician Assistant (Orthopedic Surgery) Albin Felling, OD (Optometry) Marlowe Sax, RN as Case Manager (General Practice)     Assessment:   This is a routine wellness examination for Ginette.  Hearing/Vision screen No results found.   Goals Addressed             This Visit's Progress    Prevent falls       11/04/2022 AWV Goal: Fall Prevention  Over the next year, patient will decrease their risk for falls by: Using assistive devices, such as a cane or walker, as needed Identifying fall risks within their home and correcting them by: Removing throw rugs Adding handrails to stairs or ramps Removing clutter and keeping a clear pathway throughout the home Increasing light, especially at night Adding shower handles/bars Raising toilet seat Identifying potential personal risk factors for falls: Medication side effects Incontinence/urgency Vestibular dysfunction Hearing loss Musculoskeletal disorders Neurological disorders Orthostatic hypotension         Depression Screen    11/04/2022    2:26 PM 07/08/2022    1:59 PM 04/04/2022    8:52 AM 01/23/2022   10:27 AM 10/30/2021    2:30 PM 08/06/2021   11:21 AM 12/25/2020    8:33 AM  PHQ 2/9 Scores  PHQ - 2 Score 0 2 0 0 0 2 0  PHQ- 9 Score 0 3 0   6 3     Fall Risk    11/04/2022  1:49 PM 07/08/2022    1:58 PM 04/04/2022    8:53 AM 01/23/2022   10:31 AM 10/30/2021    2:29 PM  Fall Risk   Falls in the past year? 1 0 0 0 0  Number falls in past yr: 0 0 0 0 0  Injury with Fall? 0 0 0 0 0  Risk for fall due to : No Fall Risks No Fall Risks No Fall Risks No Fall Risks No Fall Risks  Follow up Falls evaluation completed;Education provided Falls evaluation completed;Falls prevention discussed Falls evaluation completed Falls evaluation completed;Education provided Falls evaluation completed;Education provided    MEDICARE RISK AT HOME: Medicare Risk at Home Any stairs in or around the home?: Yes If so, are there any without handrails?: No Home free of loose throw rugs in walkways, pet beds, electrical cords, etc?: Yes Adequate lighting in your home to reduce risk of falls?: Yes Life alert?: No Use of a cane, walker or w/c?: No Grab bars in the bathroom?: No Shower chair or bench in shower?: No Elevated toilet seat or a handicapped toilet?: No  TIMED UP AND GO:  Was the test performed?  Yes  Length of time to ambulate 10 feet: 5 sec Gait steady and fast without use of assistive device    Cognitive Function:        11/04/2022    2:31 PM 10/30/2021    2:31 PM 10/26/2020   10:49 AM  6CIT Screen  What Year? 0 points 0 points 0 points  What month? 0 points 0 points 0 points  What time? 0 points 0 points 0 points  Count back from 20 0 points 0 points 0 points  Months in reverse 0 points 0 points 0 points  Repeat phrase 0 points 0 points 0 points  Total Score 0 points 0 points 0 points    Immunizations Immunization History  Administered Date(s) Administered   Fluad Quad(high Dose 65+) 10/07/2018, 10/20/2019, 10/19/2020, 10/30/2021   Influenza, High Dose Seasonal PF 12/02/2017   Influenza, Seasonal, Injecte, Preservative Fre 11/22/2013   Influenza,inj,Quad PF,6-35 Mos 11/26/2012   Influenza-Unspecified 11/27/2009, 11/22/2010,  10/28/2011, 11/21/2014, 12/14/2015, 10/17/2016   Moderna Sars-Covid-2 Vaccination 04/16/2019, 05/17/2019, 01/17/2020   Pfizer Covid-19 Vaccine Bivalent Booster 30yrs & up 12/25/2020   Pneumococcal Conjugate-13 12/22/2014   Pneumococcal Polysaccharide-23 03/22/2010, 10/19/2020   Tdap 11/30/2012    TDAP status: Up to date  Flu Vaccine status: Due, Education has been provided regarding the importance of this vaccine. Advised may receive this vaccine at local pharmacy or Health Dept. Aware to provide a copy of the vaccination record if obtained from local pharmacy or Health Dept. Verbalized acceptance and understanding.  Pneumococcal vaccine status: Up to date  Covid-19 vaccine status: Information provided on how to obtain vaccines.   Qualifies for Shingles Vaccine? Yes   Zostavax completed No   Shingrix Completed?: No.    Education has been provided regarding the importance of this vaccine. Patient has been advised to call insurance company to determine out of pocket expense if they have not yet received this vaccine. Advised may also receive vaccine at local pharmacy or Health Dept. Verbalized acceptance and understanding.  Screening Tests Health Maintenance  Topic Date Due   FOOT EXAM  Never done   OPHTHALMOLOGY EXAM  Never done   Diabetic kidney evaluation - Urine ACR  Never done   Zoster Vaccines- Shingrix (1 of 2) Never done   INFLUENZA VACCINE  09/19/2022   COVID-19 Vaccine (5 -  2023-24 season) 10/20/2022   Medicare Annual Wellness (AWV)  10/31/2022   DTaP/Tdap/Td (2 - Td or Tdap) 12/01/2022   HEMOGLOBIN A1C  01/05/2023   Diabetic kidney evaluation - eGFR measurement  07/05/2023   MAMMOGRAM  06/27/2024   Colonoscopy  06/22/2026   Pneumonia Vaccine 50+ Years old  Completed   DEXA SCAN  Completed   Hepatitis C Screening  Completed   HPV VACCINES  Aged Out    Health Maintenance  Health Maintenance Due  Topic Date Due   FOOT EXAM  Never done   OPHTHALMOLOGY EXAM  Never  done   Diabetic kidney evaluation - Urine ACR  Never done   Zoster Vaccines- Shingrix (1 of 2) Never done   INFLUENZA VACCINE  09/19/2022   COVID-19 Vaccine (5 - 2023-24 season) 10/20/2022   Medicare Annual Wellness (AWV)  10/31/2022    Mammogram status: Completed 06/2022. Repeat every year  Bone Density status: Completed 06/2022. Results reflect: Bone density results: OSTEOPOROSIS. Repeat every 2 years.  Lung Cancer Screening: (Low Dose CT Chest recommended if Age 55-80 years, 20 pack-year currently smoking OR have quit w/in 15years.) does not qualify.    Additional Screening:  Vision Screening: Recommended annual ophthalmology exams for early detection of glaucoma and other disorders of the eye. Is the patient up to date with their annual eye exam?  Yes  Who is the provider or what is the name of the office in which the patient attends annual eye exams? [facility]  Dental Screening: Recommended annual dental exams for proper oral hygiene   Community Resource Referral / Chronic Care Management: CRR required this visit?  No   CCM required this visit?  No     Plan:    1- Patient will get flu and COVID vaccines at upcoming appointment in November 2- Patient will check with [facility] about getting tetanus and Shingrix vaccine 3- E-faxed records request for colonoscopy and eye exam  I have personally reviewed and noted the following in the patient's chart:   Medical and social history Use of alcohol, tobacco or illicit drugs  Current medications and supplements including opioid prescriptions. Patient is not currently taking opioid prescriptions. Functional ability and status Nutritional status Physical activity Advanced directives List of other physicians Hospitalizations, surgeries, and ER visits in previous 12 months Vitals Screenings to include cognitive, depression, and falls Referrals and appointments  In addition, I have reviewed and discussed with patient certain  preventive protocols, quality metrics, and best practice recommendations. A written personalized care plan for preventive services as well as general preventive health recommendations were provided to patient.     Jacklynn Bue, LPN   3/55/[id number]

## 2022-11-05 ENCOUNTER — Ambulatory Visit: Payer: PPO

## 2022-11-05 DIAGNOSIS — I5022 Chronic systolic (congestive) heart failure: Secondary | ICD-10-CM

## 2022-11-05 DIAGNOSIS — I428 Other cardiomyopathies: Secondary | ICD-10-CM

## 2022-11-05 LAB — CUP PACEART REMOTE DEVICE CHECK
Battery Remaining Longevity: 71 mo
Battery Remaining Percentage: 75 %
Battery Voltage: 2.96 V
Brady Statistic AP VP Percent: 1 %
Brady Statistic AP VS Percent: 1.1 %
Brady Statistic AS VP Percent: 95 %
Brady Statistic AS VS Percent: 1 %
Brady Statistic RA Percent Paced: 1 %
Date Time Interrogation Session: 20240917020024
HighPow Impedance: 66 Ohm
Implantable Lead Connection Status: 753985
Implantable Lead Connection Status: 753985
Implantable Lead Connection Status: 753985
Implantable Lead Implant Date: 20221219
Implantable Lead Implant Date: 20221219
Implantable Lead Implant Date: 20221219
Implantable Lead Location: 753858
Implantable Lead Location: 753859
Implantable Lead Location: 753860
Implantable Pulse Generator Implant Date: 20221219
Lead Channel Impedance Value: 350 Ohm
Lead Channel Impedance Value: 430 Ohm
Lead Channel Impedance Value: 730 Ohm
Lead Channel Pacing Threshold Amplitude: 0.75 V
Lead Channel Pacing Threshold Amplitude: 0.75 V
Lead Channel Pacing Threshold Amplitude: 1.375 V
Lead Channel Pacing Threshold Pulse Width: 0.5 ms
Lead Channel Pacing Threshold Pulse Width: 0.5 ms
Lead Channel Pacing Threshold Pulse Width: 0.5 ms
Lead Channel Sensing Intrinsic Amplitude: 12 mV
Lead Channel Sensing Intrinsic Amplitude: 2.9 mV
Lead Channel Setting Pacing Amplitude: 1.75 V
Lead Channel Setting Pacing Amplitude: 1.875
Lead Channel Setting Pacing Amplitude: 2 V
Lead Channel Setting Pacing Pulse Width: 0.5 ms
Lead Channel Setting Pacing Pulse Width: 0.5 ms
Lead Channel Setting Sensing Sensitivity: 0.5 mV
Pulse Gen Serial Number: 111053351
Zone Setting Status: 755011

## 2022-11-21 NOTE — Progress Notes (Signed)
Remote ICD transmission.   

## 2022-12-09 ENCOUNTER — Other Ambulatory Visit: Payer: Self-pay

## 2022-12-09 ENCOUNTER — Telehealth: Payer: Self-pay | Admitting: *Deleted

## 2022-12-09 NOTE — Patient Outreach (Signed)
Care Management   Follow Up Note   12/09/2022 Name: Audrey Ruiz MRN: [id number] DOB: 05-Jul-1950   Referred by: Blane Ohara, MD Reason for referral : Care Management (RNCM: Follow up for Chronic Disease Management & Care Coordination Needs attempt)   An unsuccessful telephone outreach was attempted today. The patient was referred to the case management team for assistance with care management and care coordination.   Follow Up Plan: A HIPPA compliant phone message was left for the patient providing contact information and requesting a return call.   Danise Edge, BSN RN RN Care Manager  [facility]  Direct Number: [phone]

## 2022-12-19 ENCOUNTER — Telehealth: Payer: Self-pay | Admitting: *Deleted

## 2022-12-19 NOTE — Progress Notes (Signed)
Care Coordination Note  12/19/2022 Name: Audrey Ruiz MRN: [id number] DOB: 29-Jun-1950  Audrey Ruiz is a 72 y.o. year old female who is a primary care patient of Cox, Kirsten, MD and is actively engaged with the care management team. I reached out to Arlyss Queen by phone today to assist with re-scheduling a follow up visit with the RN Case Manager  Follow up plan: Unsuccessful telephone outreach attempt made. A HIPAA compliant phone message was left for the patient providing contact information and requesting a return call.   Burman Nieves, CCMA Care Coordination Care Guide Direct Dial: [phone]

## 2022-12-24 NOTE — Progress Notes (Signed)
  Care Coordination Note  12/24/2022 Name: Audrey Ruiz MRN: [id number] DOB: [birth year]  Audrey Ruiz [name] is a 72 y.o. year old female who is a primary care patient of Cox, Kirsten, MD and is actively engaged with the care management team. I reached out to Arlyss Queen by phone today to assist with re-scheduling a follow up visit with the RN Case Manager  Follow up plan: Telephone appointment with care management team member scheduled for: 01/03/2023  Burman Nieves, [facility] Care Coordination Care Guide Direct Dial: [phone]

## 2022-12-29 ENCOUNTER — Other Ambulatory Visit: Payer: Self-pay

## 2022-12-29 DIAGNOSIS — E782 Mixed hyperlipidemia: Secondary | ICD-10-CM

## 2022-12-29 DIAGNOSIS — R7303 Prediabetes: Secondary | ICD-10-CM

## 2022-12-30 ENCOUNTER — Other Ambulatory Visit: Payer: PPO

## 2022-12-30 DIAGNOSIS — E782 Mixed hyperlipidemia: Secondary | ICD-10-CM

## 2022-12-30 DIAGNOSIS — R7303 Prediabetes: Secondary | ICD-10-CM | POA: Diagnosis not present

## 2022-12-31 LAB — HEMOGLOBIN A1C
Est. average glucose Bld gHb Est-mCnc: 114 mg/dL
Hgb A1c MFr Bld: 5.6 % (ref 4.8–5.6)

## 2022-12-31 LAB — LIPID PANEL
Chol/HDL Ratio: 1.8 ratio (ref 0.0–4.4)
Cholesterol, Total: 155 mg/dL (ref 100–199)
HDL: 86 mg/dL (ref >39)
LDL Chol Calc (NIH): 56 mg/dL (ref 0–99)
Triglycerides: 67 mg/dL (ref 0–149)
VLDL Cholesterol Cal: 13 mg/dL (ref 5–40)

## 2022-12-31 LAB — CBC WITH DIFFERENTIAL/PLATELET
Basophils Absolute: 0.1 10*3/uL (ref 0.0–0.2)
Basos: 1 %
EOS (ABSOLUTE): 0.2 10*3/uL (ref 0.0–0.4)
Eos: 4 %
Hematocrit: 39.2 % (ref 34.0–46.6)
Hemoglobin: 12.4 g/dL (ref 11.1–15.9)
Immature Grans (Abs): 0 10*3/uL (ref 0.0–0.1)
Immature Granulocytes: 0 %
Lymphocytes Absolute: 1.8 10*3/uL (ref 0.7–3.1)
Lymphs: 42 %
MCH: 31.4 pg (ref 26.6–33.0)
MCHC: 31.6 g/dL (ref 31.5–35.7)
MCV: 99 fL — ABNORMAL HIGH (ref 79–97)
Monocytes Absolute: 0.4 10*3/uL (ref 0.1–0.9)
Monocytes: 9 %
Neutrophils Absolute: 1.9 10*3/uL (ref 1.4–7.0)
Neutrophils: 44 %
Platelets: 254 10*3/uL (ref 150–450)
RBC: 3.95 x10E6/uL (ref 3.77–5.28)
RDW: 12.5 % (ref 11.7–15.4)
WBC: 4.3 10*3/uL (ref 3.4–10.8)

## 2022-12-31 LAB — COMPREHENSIVE METABOLIC PANEL
ALT: 25 [IU]/L (ref 0–32)
AST: 33 [IU]/L (ref 0–40)
Albumin: 4.4 g/dL (ref 3.8–4.8)
Alkaline Phosphatase: 62 [IU]/L (ref 44–121)
BUN/Creatinine Ratio: 17 (ref 12–28)
BUN: 12 mg/dL (ref 8–27)
Bilirubin Total: 0.9 mg/dL (ref 0.0–1.2)
CO2: 22 mmol/L (ref 20–29)
Calcium: 10.5 mg/dL — ABNORMAL HIGH (ref 8.7–10.3)
Chloride: 106 mmol/L (ref 96–106)
Creatinine, Ser: 0.7 mg/dL (ref 0.57–1.00)
Globulin, Total: 2 g/dL (ref 1.5–4.5)
Glucose: 85 mg/dL (ref 70–99)
Potassium: 4.9 mmol/L (ref 3.5–5.2)
Sodium: 142 mmol/L (ref 134–144)
Total Protein: 6.4 g/dL (ref 6.0–8.5)
eGFR: 91 mL/min/{1.73_m2} (ref >59)

## 2023-01-02 ENCOUNTER — Encounter (INDEPENDENT_AMBULATORY_CARE_PROVIDER_SITE_OTHER): Payer: PPO | Admitting: Family Medicine

## 2023-01-02 DIAGNOSIS — I5022 Chronic systolic (congestive) heart failure: Secondary | ICD-10-CM

## 2023-01-02 NOTE — Assessment & Plan Note (Signed)
Hemoglobin A1c 5.6%, 3 month avg of blood sugars, is in prediabetic range.  In order to prevent progression to diabetes, recommend low carb diet and regular exercise  

## 2023-01-02 NOTE — Assessment & Plan Note (Signed)
The current medical regimen is effective;  continue present plan and medications. Taking Omeprazole 20 mg daily 

## 2023-01-02 NOTE — Assessment & Plan Note (Signed)
Previously well controlled Continue Synthroid at current dose  

## 2023-01-02 NOTE — Assessment & Plan Note (Signed)
Management per specialist. The current medical regimen is effective;  continue present plan and medications. Taking Entresto 24-26 mg daily.  

## 2023-01-02 NOTE — Progress Notes (Signed)
Subjective:  Patient ID: Audrey Ruiz, female    DOB: 1950-07-05  Age: 72 y.o. MRN: [id number]  No chief complaint on file.   HPI     Pre- Diabetes: Last Hba1c: 5.6  Nonischemic cardiomyopathy thought to be due to adriamycin. CORONARY ARTERY DISEASE noncritical cad, CONGESTIVE HEART FAILURE - on entresto. Patient is status post BiV ICD 2022 with improved EF on her most recent echocardiogram 50 to 55% Entresto 24-26 twice daily.    Hyperlipidemia: on zetia 10 mg once daily, on crestor 40 mg once daily at night.   GERD: Currently on omeprazole 20 mg once daily.  Depression, mild, recurrent: Currently on Prozac 40 mg once daily.    Insomnia: takes melatonin. would like lorazepam.  Patient was given a prescription for lorazepam nearly 2 years ago for 30 pills and has not used them up.       11/04/2022    2:26 PM 07/08/2022    1:59 PM 04/04/2022    8:52 AM 01/23/2022   10:27 AM 10/30/2021    2:30 PM  Depression screen PHQ 2/9  Decreased Interest 0 1 0 0 0  Down, Depressed, Hopeless 0 1 0 0 0  PHQ - 2 Score 0 2 0 0 0  Altered sleeping 0 0 0    Tired, decreased energy 0 1 0    Change in appetite 0 0 0    Feeling bad or failure about yourself  0 0 0    Trouble concentrating 0 0 0    Moving slowly or fidgety/restless 0 0 0    Suicidal thoughts 0 0 0    PHQ-9 Score 0 3 0    Difficult doing work/chores Not difficult at all Not difficult at all Not difficult at all          11/04/2022    1:49 PM  Fall Risk   Falls in the past year? 1  Number falls in past yr: 0  Injury with Fall? 0  Risk for fall due to : No Fall Risks  Follow up Falls evaluation completed;Education provided    Patient Care Team: Blane Ohara, MD as PCP - General (Family Medicine) Thomasene Ripple, DO as PCP - Cardiology (Cardiology) Regan Lemming, MD as PCP - Electrophysiology (Cardiology) Weston Settle, MD as Consulting Physician (Oncology) Thomasene Ripple, DO as Consulting Physician  (Cardiology) Karma Greaser, PA as Physician Assistant (Orthopedic Surgery) Albin Felling, OD (Optometry) Marlowe Sax, RN as Case Manager (General Practice)   Review of Systems  Current Outpatient Medications on File Prior to Visit  Medication Sig Dispense Refill   cetirizine (ZYRTEC) 10 MG tablet Take 10 mg by mouth daily.     ENTRESTO 24-26 MG TAKE ONE TABLET BY MOUTH EVERY MORNING and TAKE ONE TABLET BY MOUTH EVERYDAY AT BEDTIME 180 tablet 3   ezetimibe (ZETIA) 10 MG tablet TAKE 1 TABLET BY MOUTH EVERY DAY AT BEDTIME 30 tablet 10   FLUoxetine (PROZAC) 40 MG capsule TAKE 1 CAPSULE BY MOUTH ONCE DAILY *REFILL REQUEST* 30 capsule 2   fluticasone (FLONASE) 50 MCG/ACT nasal spray USE TWO SPRAYS IN EACH NOSTRIL ONCE DAILY 48 g 3   Glucosamine-Chondroitin-MSM 1500-1200-500 MG PACK Take 1 tablet by mouth daily.     HYDROcodone bit-homatropine (HYDROMET) 5-1.5 MG/5ML syrup Take 5 mLs by mouth 2 (two) times daily as needed for cough. 120 mL 0   ibuprofen (ADVIL) 200 MG tablet Take 200 mg by mouth every 8 (eight)  hours as needed for moderate pain.     levothyroxine (SYNTHROID) 25 MCG tablet TAKE 1 TABLET BY MOUTH DAILY BEFORE BREAKFAST *REFILL REQUEST* 30 tablet 2   LORazepam (ATIVAN) 0.5 MG tablet Take 1 tablet (0.5 mg total) by mouth at bedtime as needed for anxiety. 30 tablet 2   Melatonin 10 MG CAPS Take 5 mg by mouth at bedtime.     Multiple Vitamin (MULTIVITAMIN) capsule Take 1 capsule by mouth daily.     Omega-3 Fatty Acids (FISH OIL) 1000 MG CAPS Take 1,000 mg by mouth 2 (two) times daily.     omeprazole (PRILOSEC) 20 MG capsule TAKE 1 CAPSULE BY MOUTH ONCE DAILY *REFILL REQUEST* 30 capsule 2   rosuvastatin (CRESTOR) 40 MG tablet TAKE 1 TABLET BY MOUTH EVERY DAY AT BEDTIME 30 tablet 10   Vitamin D, Cholecalciferol, 25 MCG (1000 UT) CAPS Take 1,000 Units by mouth daily.     No current facility-administered medications on file prior to visit.   Past Medical History:  Diagnosis  Date   Abnormal nuclear stress test 08/08/2020   Abnormal nuclear stress test 08/08/2020   Anxiety    Closed fracture of distal end of left radius 08/16/2020   Depressed left ventricular ejection fraction    Depression    Diabetes mellitus without complication (HCC)    Dilated cardiomyopathy (HCC) 08/08/2020   Encounter for Medicare annual wellness exam 10/19/2019   Gastroesophageal reflux disease 10/19/2019   GERD (gastroesophageal reflux disease)    Hydropneumothorax 02/08/2021   Hyperlipidemia    Hyperlipidemia associated with type 2 diabetes mellitus (HCC) 10/19/2019   LBBB (left bundle branch block) 08/17/2020   Mitral valve prolapse    Nonischemic cardiomyopathy (HCC) 08/17/2020   Osteopenia 10/19/2019   Sinusitis 08/16/2019   Upper respiratory tract infection due to COVID-19 virus 03/18/2021   Past Surgical History:  Procedure Laterality Date   BIV ICD INSERTION CRT-D N/A 02/05/2021   Procedure: BIV ICD INSERTION CRT-D;  Surgeon: Regan Lemming, MD;  [facility] INVASIVE CV LAB;  Service: Cardiovascular;  Laterality: N/A;   BREAST LUMPECTOMY Left 2010   CATARACT EXTRACTION BILATERAL W/ ANTERIOR VITRECTOMY  2021   EYE SURGERY Bilateral    Cataract Removal   LEFT HEART CATH AND CORONARY ANGIOGRAPHY N/A 08/08/2020   Procedure: LEFT HEART CATH AND CORONARY ANGIOGRAPHY;  Surgeon: Marykay Lex, MD;  Location: [facility] INVASIVE CV LAB;  Service: Cardiovascular;  Laterality: N/A;    Family History  Problem Relation Age of Onset   Osteoporosis Mother    Heart disease Father    Hyperlipidemia Father    Coronary artery disease Father    Diabetes Sister    Breast cancer Daughter    Diabetes Maternal Grandfather    Social History   Socioeconomic History   Marital status: Married    Spouse name: Viviann Spare   Number of children: 2   Years of education: Not on file   Highest education level: Master's degree (e.g., MA, MS, MEng, MEd, MSW, MBA)  Occupational History    Occupation: Retired  Tobacco Use   Smoking status: Never   Smokeless tobacco: Never  Vaping Use   Vaping status: Never Used  Substance and Sexual Activity   Alcohol use: Yes    Alcohol/week: 4.0 standard drinks of alcohol    Types: 4 Glasses of wine per week    Comment: occasional (white wine)   Drug use: Never   Sexual activity: Not Currently  Other Topics Concern   Not  on file  Social History Narrative   Son lives in [place], Daughter lives in [place]   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   Social Determinants of Health   Financial Resource Strain: Low Risk  (11/04/2022)   Overall Financial Resource Strain (CARDIA)    Difficulty of Paying Living Expenses: Not very hard  Food Insecurity: No Food Insecurity (11/04/2022)   Hunger Vital Sign    Worried About Running Out of Food in the Last Year: Never true    Ran Out of Food in the Last Year: Never true  Transportation Needs: No Transportation Needs (11/04/2022)   PRAPARE - Administrator, Civil Service (Medical): No    Lack of Transportation (Non-Medical): No  Physical Activity: Sufficiently Active (11/04/2022)   Exercise Vital Sign    Days of Exercise per Week: 6 days    Minutes of Exercise per Session: 30 min  Stress: No Stress Concern Present (11/04/2022)   [employer] of Occupational Health - Occupational Stress Questionnaire    Feeling of Stress : Not at all  Social Connections: Socially Integrated (11/04/2022)   Social Connection and Isolation Panel [NHANES]    Frequency of Communication with Friends and Family: More than three times a week    Frequency of Social Gatherings with Friends and Family: More than three times a week    Attends Religious Services: More than 4 times per year    Active Member of [employer] or Organizations: Yes    Attends Engineer, structural: More than 4 times per year    Marital Status: Married    Objective:  There were no vitals taken for this  visit.     11/04/2022    2:24 PM 09/13/2022   10:30 AM 07/08/2022    2:37 PM  BP/Weight  Systolic BP 120 119 128  Diastolic BP 68 59 64  Wt. (Lbs) 138.4 136.8 135  BMI 22 kg/m2 21.75 kg/m2 21.46 kg/m2    Physical Exam  Diabetic Foot Exam - Simple   No data filed      Lab Results  Component Value Date   WBC 4.3 12/30/2022   HGB 12.4 12/30/2022   HCT 39.2 12/30/2022   PLT 254 12/30/2022   GLUCOSE 85 12/30/2022   CHOL 155 12/30/2022   TRIG 67 12/30/2022   HDL 86 12/30/2022   LDLCALC 56 12/30/2022   ALT 25 12/30/2022   AST 33 12/30/2022   NA 142 12/30/2022   K 4.9 12/30/2022   CL 106 12/30/2022   CREATININE 0.70 12/30/2022   BUN 12 12/30/2022   CO2 22 12/30/2022   TSH 1.639 09/13/2022   HGBA1C 5.6 12/30/2022      Assessment & Plan:    Chronic systolic congestive heart failure [facility]) Assessment & Plan: Management per specialist. The current medical regimen is effective;  continue present plan and medications. Taking Entresto 24-26 mg daily.    Gastroesophageal reflux disease without esophagitis Assessment & Plan: The current medical regimen is effective;  continue present plan and medications. Taking Omeprazole 20 mg daily   Other specified hypothyroidism Assessment & Plan: Previously well controlled Continue Synthroid at current dose      Mixed hyperlipidemia Assessment & Plan: Well controlled.  No changes to medicines. Zetia 10 mg daily, rosuvastatin 40 mg daily, fish oil 1000 mg BID. Continue to work on eating a healthy diet and exercise.     Prediabetes Assessment & Plan: Hemoglobin A1c 5.6%, 3 month avg  of blood sugars, is in prediabetic range.  In order to prevent progression to diabetes, recommend low carb diet and regular exercise       No orders of the defined types were placed in this encounter.   No orders of the defined types were placed in this encounter.    Follow-up: No follow-ups on file.   I,Jarvis Knodel I Leal-Borjas,acting  as a scribe for Blane Ohara, MD.,have documented all relevant documentation on the behalf of Blane Ohara, MD,as directed by  Blane Ohara, MD while in the presence of Blane Ohara, MD.   An After Visit Summary was printed and given to the patient.  Blane Ohara, MD [facility] [phone]

## 2023-01-02 NOTE — Assessment & Plan Note (Signed)
Well controlled.  No changes to medicines. Zetia 10 mg daily, rosuvastatin 40 mg daily, fish oil 1000 mg BID. Continue to work on eating a healthy diet and exercise.

## 2023-01-03 ENCOUNTER — Other Ambulatory Visit: Payer: Self-pay

## 2023-01-03 NOTE — Patient Outreach (Signed)
Care Management   Visit Note  01/03/2023 Name: Audrey Ruiz MRN: [id number] DOB: December 25, 1950  Subjective: Audrey Ruiz is a 72 y.o. year old female who is a primary care patient of Cox, Kirsten, MD. The Care Management team was consulted for assistance.      Engaged with patient spoke with patient by telephone.    Goals Addressed             This Visit's Progress    RNCM Care Management Expected Outcome:  Monitor, Self-Manage and Reduce Symptoms of Heart Failure       Current Barriers:  Chronic Disease Management support and education needs related to effective management of HF Wt Readings from Last 3 Encounters:  11/04/22 138 lb 6.4 oz (62.8 kg)  09/13/22 136 lb 12.8 oz (62.1 kg)  07/08/22 135 lb (61.2 kg)     Planned Interventions: Basic overview and discussion of pathophysiology of Heart Failure reviewed. The patient has a good understanding of her cardiac conditions and follows up as directed. Blood pressures are stable. Weight is stable. Her cardiac conditions are stable.  She sees the specialist on a regular basis. Denies any acute changes at this time. Review of when to call the provider for changes. Also encouraged the patient to write down questions to ask the providers when she has appointments.  Provided education on low sodium diet. States her appetite is good, denies any issues with heart healthy diet, monitors sugar content as well. A little concerned about the holidays coming up and all the sweets. Talked about eating in moderation.  Reviewed Heart Failure Action Plan in depth and provided written copy Assessed need for readable accurate scales in home Provided education about placing scale on hard, flat surface Advised patient to weigh each morning after emptying bladder Discussed importance of daily weight and advised patient to weigh and record daily. The patients weight is stable. Denies any acute fluctuations in her weight. Reviewed  role of diuretics in prevention of fluid overload and management of heart failure Medication management: The patient takes Entresto 24/26 mg as directed. Denies any issues with medication cost or obtaining medications. Will continue to monitor for new needs or concerns.  Discussed the importance of keeping all appointments with provider. The patient is compliant with appointments.The patient keeps her appointments. Was supposed to see the pcp yesterday but the pcp had an emergency. Will follow up next week with the pcp.  Provided patient with education about the role of exercise in the management of heart failure Advised patient to discuss changes in HF, questions or concerns about heart health with provider Screening for signs and symptoms of depression related to chronic disease state  Assessed social determinant of health barriers EF 50-55% 06-06-2021 Pacemaker placement in early 2023  Symptom Management: Take medications as prescribed   Attend all scheduled provider appointments Call provider office for new concerns or questions  call the Suicide and Crisis Lifeline: 988 call the [place] National Suicide Prevention Lifeline: 239-059-5022 or TTY: 707-117-5996 TTY (610) 046-6157) to talk to a trained counselor call 1-800-273-TALK (toll free, 24 hour hotline) if experiencing a Mental Health or Behavioral Health Crisis  call office if I gain more than 2 pounds in one day or 5 pounds in one week use salt in moderation watch for swelling in feet, ankles and legs every day weigh myself daily develop a rescue plan follow rescue plan if symptoms flare-up track symptoms and what helps feel better or worse dress right  for the weather, hot or cold  Follow Up Plan: Telephone follow up appointment with care management team member scheduled for: 03-07-2023 at 1030 am       RNCM Care Management Expected Outcome:  Monitor, Self-Manage and Reduce Symptoms of:HLD       Current Barriers:  Chronic Disease  Management support and education needs related to HLD Caregiver stress: The patient assist and cares for her elderly parents and several others Lab Results  Component Value Date   CHOL 155 12/30/2022   HDL 86 12/30/2022   LDLCALC 56 12/30/2022   TRIG 67 12/30/2022   CHOLHDL 1.8 12/30/2022     Planned Interventions: Provider established cholesterol goals reviewed. Review of most recent lab work. The patient is at goal. . The patient has started on Synthroid and she states that she can tell a positive difference in her energy level and how she feels. She ask about doubling her Synthroid since she feels well. Advised the patient not to change her dosage of Synthroid. Education provided on discussing medication changes with the pcp and the pcp would do labs to check her levels. The patient verbalized understanding.  She states that she is doing well for the most part. Her Father passed away on 11-Jul-2024and he was 74 years old. She says this is hard but he had a good life and him and her mother were married for 74 years. Reflective listening and support.  She will follow up with the pcp for AWV in September and November.   Counseled on importance of regular laboratory monitoring as prescribed. Review and education provided. Has labs on a regular basis Provided HLD educational materials; Reviewed role and benefits of statin for ASCVD risk reduction; Discussed strategies to manage statin-induced myalgias. The patient takes Zetia 10 mg daily, Crestor 40 mg at night, and Fish Oil 1000 mg BID. She denies any issues with current medications regimen. ; Reviewed importance of limiting foods high in cholesterol. Review of heart healthy/ADA diet. ; Reviewed exercise goals and target of 150 minutes per week; Screening for signs and symptoms of depression related to chronic disease state;  Assessed social determinant of health barriers;  Discussed resources available to help with needs as they arise. Discussed  self care and making sure she carves out time for self care and her own needs. The patient assist with the care of her elderly mother, mother in law, and husband. Also helps with the care of elderly aunt and recently moving an aunt to the [place] area. Discussed making sure she is monitoring her stress level and health and well being. The patient recently started Levothyroxine 25 mcg for an elevated Thyroid level of 4.845 on 04-02-2022. She states the synthroid is working well for her and she can tell a positive difference. The patient is experiencing more cramping in her legs. Review of potassium and sodium levels. They are stable. Review of elevation of legs when sitting and use of compression socks to help with circulation and to see if this helps with the "cramps" she is experiencing. Encouraged the patient to write down questions to ask the pcp at her visits. The patient does not take gabapentin and this may be something she would want to talk to the pcp about at her upcoming visit. Education provided on the AVS.  Her husband has been diagnosed with Alzheimer's disease. The patient says it is early. He will see a neurologist in December. Recommended "The 36-hour day"- box and [employer]- on  youtube. Education provided. Will continue to monitor and add educational information in myChart.    Symptom Management: Take medications as prescribed   Attend all scheduled provider appointments Call provider office for new concerns or questions  call the Suicide and Crisis Lifeline: 988 call the [place] National Suicide Prevention Lifeline: 780-675-3619 or TTY: 816-323-6896 TTY (415)639-0028) to talk to a trained counselor call 1-800-273-TALK (toll free, 24 hour hotline) if experiencing a Mental Health or Behavioral Health Crisis  - call for medicine refill 2 or 3 days before it runs out - take all medications exactly as prescribed - call doctor with any symptoms you believe are related to your medicine -  call doctor when you experience any new symptoms - go to all doctor appointments as scheduled - adhere to prescribed diet: heart healthy diet  Follow Up Plan: Telephone follow up appointment with care management team member scheduled for: 03-07-2023 at 1030 am           Consent to Services:  Patient was given information about care management services, agreed to services, and gave verbal consent to participate.   Plan: Telephone follow up appointment with care management team member scheduled for: 03-07-2023 and 1030 am  Alto Denver RN, MSN, CCM RN Care Manager  [facility]  Ambulatory Care Management  Direct Number: [phone]

## 2023-01-03 NOTE — Patient Instructions (Signed)
Visit Information  Thank you for taking time to visit with me today. Please don't hesitate to contact me if I can be of assistance to you before our next scheduled telephone appointment.  Following are the goals we discussed today:   Goals Addressed             This Visit's Progress    RNCM Care Management Expected Outcome:  Monitor, Self-Manage and Reduce Symptoms of Heart Failure       Current Barriers:  Chronic Disease Management support and education needs related to effective management of HF Wt Readings from Last 3 Encounters:  11/04/22 138 lb 6.4 oz (62.8 kg)  09/13/22 136 lb 12.8 oz (62.1 kg)  07/08/22 135 lb (61.2 kg)     Planned Interventions: Basic overview and discussion of pathophysiology of Heart Failure reviewed. The patient has a good understanding of her cardiac conditions and follows up as directed. Blood pressures are stable. Weight is stable. Her cardiac conditions are stable.  She sees the specialist on a regular basis. Denies any acute changes at this time. Review of when to call the provider for changes. Also encouraged the patient to write down questions to ask the providers when she has appointments.  Provided education on low sodium diet. States her appetite is good, denies any issues with heart healthy diet, monitors sugar content as well. A little concerned about the holidays coming up and all the sweets. Talked about eating in moderation.  Reviewed Heart Failure Action Plan in depth and provided written copy Assessed need for readable accurate scales in home Provided education about placing scale on hard, flat surface Advised patient to weigh each morning after emptying bladder Discussed importance of daily weight and advised patient to weigh and record daily. The patients weight is stable. Denies any acute fluctuations in her weight. Reviewed role of diuretics in prevention of fluid overload and management of heart failure Medication management: The patient  takes Entresto 24/26 mg as directed. Denies any issues with medication cost or obtaining medications. Will continue to monitor for new needs or concerns.  Discussed the importance of keeping all appointments with provider. The patient is compliant with appointments.The patient keeps her appointments. Was supposed to see the pcp yesterday but the pcp had an emergency. Will follow up next week with the pcp.  Provided patient with education about the role of exercise in the management of heart failure Advised patient to discuss changes in HF, questions or concerns about heart health with provider Screening for signs and symptoms of depression related to chronic disease state  Assessed social determinant of health barriers EF 50-55% 06-06-2021 Pacemaker placement in early 2023  Symptom Management: Take medications as prescribed   Attend all scheduled provider appointments Call provider office for new concerns or questions  call the Suicide and Crisis Lifeline: 988 call the [place] National Suicide Prevention Lifeline: 281-790-3356 or TTY: (410) 423-9885 TTY 209 660 5395) to talk to a trained counselor call 1-800-273-TALK (toll free, 24 hour hotline) if experiencing a Mental Health or Behavioral Health Crisis  call office if I gain more than 2 pounds in one day or 5 pounds in one week use salt in moderation watch for swelling in feet, ankles and legs every day weigh myself daily develop a rescue plan follow rescue plan if symptoms flare-up track symptoms and what helps feel better or worse dress right for the weather, hot or cold  Follow Up Plan: Telephone follow up appointment with care management team member scheduled for:  03-07-2023 at 1030 am       RNCM Care Management Expected Outcome:  Monitor, Self-Manage and Reduce Symptoms of:HLD       Current Barriers:  Chronic Disease Management support and education needs related to HLD Caregiver stress: The patient assist and cares for her elderly  parents and several others Lab Results  Component Value Date   CHOL 155 12/30/2022   HDL 86 12/30/2022   LDLCALC 56 12/30/2022   TRIG 67 12/30/2022   CHOLHDL 1.8 12/30/2022     Planned Interventions: Provider established cholesterol goals reviewed. Review of most recent lab work. The patient is at goal. . The patient has started on Synthroid and she states that she can tell a positive difference in her energy level and how she feels. She ask about doubling her Synthroid since she feels well. Advised the patient not to change her dosage of Synthroid. Education provided on discussing medication changes with the pcp and the pcp would do labs to check her levels. The patient verbalized understanding.  She states that she is doing well for the most part. Her Father passed away on 11-Jul-2024and he was 74 years old. She says this is hard but he had a good life and him and her mother were married for 74 years. Reflective listening and support.  She will follow up with the pcp for AWV in September and November.   Counseled on importance of regular laboratory monitoring as prescribed. Review and education provided. Has labs on a regular basis Provided HLD educational materials; Reviewed role and benefits of statin for ASCVD risk reduction; Discussed strategies to manage statin-induced myalgias. The patient takes Zetia 10 mg daily, Crestor 40 mg at night, and Fish Oil 1000 mg BID. She denies any issues with current medications regimen. ; Reviewed importance of limiting foods high in cholesterol. Review of heart healthy/ADA diet. ; Reviewed exercise goals and target of 150 minutes per week; Screening for signs and symptoms of depression related to chronic disease state;  Assessed social determinant of health barriers;  Discussed resources available to help with needs as they arise. Discussed self care and making sure she carves out time for self care and her own needs. The patient assist with the care of  her elderly mother, mother in law, and husband. Also helps with the care of elderly aunt and recently moving an aunt to the [place] area. Discussed making sure she is monitoring her stress level and health and well being. The patient recently started Levothyroxine 25 mcg for an elevated Thyroid level of 4.845 on 04-02-2022. She states the synthroid is working well for her and she can tell a positive difference. The patient is experiencing more cramping in her legs. Review of potassium and sodium levels. They are stable. Review of elevation of legs when sitting and use of compression socks to help with circulation and to see if this helps with the "cramps" she is experiencing. Encouraged the patient to write down questions to ask the pcp at her visits. The patient does not take gabapentin and this may be something she would want to talk to the pcp about at her upcoming visit. Education provided on the AVS.  Her husband has been diagnosed with Alzheimer's disease. The patient says it is early. He will see a neurologist in December. Recommended "The 36-hour day"- box and Teepa Snow- on youtube. Education provided. Will continue to monitor and add educational information in myChart.    Symptom Management: Take medications as  prescribed   Attend all scheduled provider appointments Call provider office for new concerns or questions  call the Suicide and Crisis Lifeline: 988 call the [place] National Suicide Prevention Lifeline: 913-007-4496 or TTY: 832 537 0769 TTY 331-325-3419) to talk to a trained counselor call 1-800-273-TALK (toll free, 24 hour hotline) if experiencing a Mental Health or Behavioral Health Crisis  - call for medicine refill 2 or 3 days before it runs out - take all medications exactly as prescribed - call doctor with any symptoms you believe are related to your medicine - call doctor when you experience any new symptoms - go to all doctor appointments as scheduled - adhere to  prescribed diet: heart healthy diet  Follow Up Plan: Telephone follow up appointment with care management team member scheduled for: 03-07-2023 at 1030 am           Our next appointment is by telephone on 03-07-2023 at 1030 am  Please call the care guide team at [phone] if you need to cancel or reschedule your appointment.   If you are experiencing a Mental Health or Behavioral Health Crisis or need someone to talk to, please call the Suicide and Crisis Lifeline: 988 call the [place] National Suicide Prevention Lifeline: 602-748-0786 or TTY: 765-478-4534 TTY 281-768-8729) to talk to a trained counselor call 1-800-273-TALK (toll free, 24 hour hotline) go to [facility] [address] [place] [phone])   Patient verbalizes understanding of instructions and care plan provided today and agrees to view in MyChart. Active MyChart status and patient understanding of how to access instructions and care plan via MyChart confirmed with patient.      Alto Denver RN, MSN, CCM RN Care Manager  [place]  [facility]  Direct Number: [phone]   Alzheimer's Disease Alzheimer's disease is a disease that affects the way your brain works. It affects memory and causes changes in how you think, talk, and act. The disease gets worse over time. Alzheimer's disease is a form of dementia. What are the causes? Alzheimer's disease happens when a protein called beta-amyloid forms deposits in the brain. It's not known what causes these to form. The disease may also be caused by a harmful change in a gene that's passed down, or inherited, from one or both parents. Not everyone who gets the changed gene from their parents will get the disease. What increases the risk? Being older than 74 years of age. Being female. Having any of these conditions: High blood pressure. Diabetes. Heart or blood vessel disease. Smoking. Being very  overweight. Having a brain injury or a stroke in the past. Having a family history of dementia. What are the signs or symptoms?  Symptoms may happen in three stages, which often overlap. Early stage In this stage, you can still do things on your own. You may still be able to drive, work, and be social. Symptoms in this stage include: Forgetting small things, like a name, words, or what you did recently. Having a hard time with: Paying attention or learning new things. Talking with people. Doing your usual tasks. Solving problems or doing math. Following instructions. Feeling worried or nervous. Not wanting to be around people. Losing interest in doing things. Moderate stage In this stage, you'll start to need care. Symptoms include: Trouble saying what you're thinking. Memory loss that affects daily life. This can include forgetting: Things that happened recently. If you've taken medicines or eaten. Places you know. You may get lost while walking or  driving. To pay bills. To bathe or use the bathroom. Being confused about where you are or what time it is. Trouble judging distance. Changes in how you feel or act. You may be moody, angry, upset, scared, worried, or suspicious. Not thinking clearly or making good choices. You may have delusions or false beliefs. Hallucinations. This means you see, hear, taste, smell, or feel things that aren't real. Severe stage In the severe stage, you'll need help with your personal care and daily activities. Symptoms include: Memory loss getting worse. Personality changes. Not knowing where you are. Physical problems, like trouble walking, sitting, or swallowing. More trouble talking with others. Not being able to control when you pee (urinate) or poop. More changes in how you act. How is this diagnosed? You may need to see a nervous system specialist, called a neurologist, or a health care provider who focuses on the care of older adults.  Alzheimer's disease may be diagnosed based on: Your symptoms and medical history. Your provider will talk with you and your family, friends, and caregivers about your history and symptoms. A physical exam. Tests. These may include: Lab tests, such as tests on your blood or pee. Imaging tests. You may have a CT scan, a PET scan, or an MRI. A lumbar puncture. For this test, a sample of the fluid around the brain and spinal cord is taken and tested. Tests to check your thinking and memory. Genetic testing. This may be done if you get the disease before age 32 or if other family members have had the disease. How is this treated? At this time, there's no cure for Alzheimer's disease. The goals of treatment are to: Manage symptoms that affect behavior. Make sure you're safe at home. Help manage daily life for you and your caregivers. Treatment may include: Medicines. You may get medicines that can: Slow down how fast the disease gets worse. Help with memory and behavior. Cognitive therapy. This gives you support, training to help with thinking skills, and memory aids. Counseling or spiritual guidance. These can help you deal with the many feelings you may have, such as fear, anger, or feeling alone. Caregivers. These are people who help you with your daily tasks. Family support groups. These allow your family members to learn about the disease, get emotional support, and find out about resources to help take care of you. Follow these instructions at home:  Medicines Take over-the-counter and prescription medicines only as told by your provider. Use a pill organizer or pill reminder to help you keep track of your medicines. Avoid taking medicines that can affect thinking, such as medicines for pain or sleep. Lifestyle Make healthy choices: Be active as told by your provider. Regular exercise may help with symptoms. Do not smoke, vape, or use products with nicotine or tobacco in them. Do not  drink alcohol. Eat a healthy diet. When you feel a lot of stress, do something that helps you relax. Try things like yoga or deep breathing. Spend time with people. Drink enough fluid to keep your pee pale yellow. Make sure you sleep well. These tips can help: Try not to take long naps during the day. Take short naps of 30 minutes or less if needed. Keep your bedroom dark and cool. Do not exercise during the few hours before you go to bed. Avoid caffeine products in the afternoon and evening. General instructions Work with your provider to decide: What things you need help with. What your safety needs are. If  you were given a bracelet that tracks where you are and shows that you're a person with memory loss, make sure you wear it at all times. Talk with your provider about whether it's safe for you to drive. Work with your family to make big legal or health decisions. This may include things like advance directives, medical power of attorney, or a living will. Where to find more information There are two ways to contact the Alzheimer's Association: Call the 24-hour helpline at 9191107417. Visit WesternTunes.it Contact a health care provider if: Your medicine causes you to have nausea, vomiting, or trouble with eating. You have mood or behavior changes that are getting worse, such as feeling depressed, worried, or nervous. You have hallucinations. You or your family members are worried about your safety. You're hard to wake up. Your memory suddenly gets worse. Get help right away if: You feel like you may hurt yourself or others. You have thoughts about taking your own life. Take one of these steps: Go to your nearest emergency room. Call 911. Call the National Suicide Prevention Lifeline at 3611257028 or 988. Text the Crisis Text Line at (501)546-4458. This information is not intended to replace advice given to you by your health care provider. Make sure you discuss any questions you  have with your health care provider. Document Revised: 05/07/2022 Document Reviewed: 05/07/2022 Elsevier Patient Education  2024 [employer] Alzheimer's Disease Caregiver Guide Alzheimer's disease is a condition that makes a person: Forget things. Act differently. Have trouble paying attention and doing simple tasks. These things get worse with time. The tips below can help you care for the person. How to help manage lifestyle changes Tips to help with symptoms Be calm and patient. Give simple, short answers to questions. Avoid correcting the person in a negative way. Try not to take things personally, even if the person forgets your name. Do not argue with the person. This may make the person more upset. Tips to lessen frustration Make appointments and do daily tasks when the person is at his or her best. Take your time. Simple tasks may take longer. Allow plenty of time to complete tasks. Limit choices for the person. Involve the person in what you are doing. Keep things organized: Keep a daily routine. Organize medicines in a pillbox for each day of the week. Keep a calendar in a central location to remind the person of meetings or other activities. Avoid new or crowded places, if possible. Use simple words, short sentences, and a calm voice. Only give one direction at a time. Buy clothes and shoes that are easy to put on and take off. Try to change the subject if the person becomes frustrated or angry. Tips to prevent injury  Keep floors clear. Remove rugs, magazine racks, and floor lamps. Keep hallways well-lit. Put a handrail and non-slip mat in the bathtub or shower. Put childproof locks on cabinets that have dangerous items in them. These items include medicine, alcohol, guns, toxic cleaning items, sharp tools, matches, and lighters. Put locks on doors where the person cannot see or reach them. This helps keep the person from going out of the house and getting lost. Be  ready for emergencies. Keep a list of emergency phone numbers and addresses close by. Remove car keys and lock garage doors so that the person does not try to drive. Bracelets may be worn that track location and identify the person as having memory problems. This should be worn at all times for  safety. Tips for the future  Discuss financial and legal planning early. People with this disease have trouble managing their money as the disease gets worse. Get help from a professional. Talk about advance directives, safety, and daily care. Take these steps: Create a living will and choose a power of attorney. This is someone who can make decisions for the person with Alzheimer's disease when he or she can no longer do so. Discuss driving safety and when to stop driving. The person's doctor can help with this. If the person lives alone, make sure he or she is safe. Some people need extra help at home. Other people need more care at a nursing home or care center. How to recognize changes in the person's condition With this disease, memory problems and confusion slowly get worse. In time, the person may not know his or her friends and family members. The disease can also cause changes in behavior and mood, such as anxiety or anger. The person may see, hear, taste, smell, or feel things that are not real (hallucinate). These changes can come on all of a sudden. They may happen in response to something such as: Pain. An infection. Changes in temperature or noise. Too much stimulation. Feeling lost or scared. Medicines. Where to find support Find out about services that can provide short-term care (respite care). These can allow you to take a break when you need it. Join a support group near you. These groups can help you: Learn ways to manage stress. Share experiences with others. Get emotional comfort and support. Learn about caregiving as the disease gets worse. Know what community resources are  available. Where to find more information Alzheimer's Association: LimitLaws.hu Contact a doctor if: The person has a fever. The person has a sudden behavior change that does not get better with calming strategies. The person is not able to take care of himself or herself at home. You are no longer able to care for the person. Get help right away if: The person has a sudden increase in confusion or new hallucinations. The person threatens you or anyone else, including himself or herself. Get help right away if you feel like your loved one may hurt himself or herself or others, or has thoughts about taking his or her own life. Go to your nearest emergency room or: Call your local emergency services (911 in the [place] Call the National Suicide Prevention Lifeline at 419-177-3387 or 988 in the [place] This is open 24 hours a day. Text the Crisis Text Line at 737-335-7295. Summary Alzheimer's disease causes a person to forget things. A person who has this condition may have trouble doing simple tasks. Take steps to keep the person from getting hurt. Plan for future care. You can find support by joining a support group near you. This information is not intended to replace advice given to you by your health care provider. Make sure you discuss any questions you have with your health care provider. Document Revised: 08/30/2020 Document Reviewed: 05/24/2019 Elsevier Patient Education  2024 [employer].

## 2023-01-06 ENCOUNTER — Ambulatory Visit: Payer: PPO

## 2023-01-07 ENCOUNTER — Other Ambulatory Visit: Payer: PPO

## 2023-01-09 ENCOUNTER — Ambulatory Visit: Payer: PPO

## 2023-01-09 ENCOUNTER — Ambulatory Visit: Payer: PPO | Admitting: Family Medicine

## 2023-01-09 ENCOUNTER — Encounter: Payer: Self-pay | Admitting: Family Medicine

## 2023-01-09 VITALS — BP 100/60 | HR 81 | Temp 97.0°F | Resp 14 | Ht 66.5 in | Wt 138.0 lb

## 2023-01-09 DIAGNOSIS — K219 Gastro-esophageal reflux disease without esophagitis: Secondary | ICD-10-CM

## 2023-01-09 DIAGNOSIS — I5022 Chronic systolic (congestive) heart failure: Secondary | ICD-10-CM

## 2023-01-09 DIAGNOSIS — E782 Mixed hyperlipidemia: Secondary | ICD-10-CM

## 2023-01-09 DIAGNOSIS — F330 Major depressive disorder, recurrent, mild: Secondary | ICD-10-CM | POA: Diagnosis not present

## 2023-01-09 DIAGNOSIS — F5102 Adjustment insomnia: Secondary | ICD-10-CM

## 2023-01-09 NOTE — Assessment & Plan Note (Signed)
Not taking lorazepam for sleep anymore Taking melatonin as needed

## 2023-01-09 NOTE — Assessment & Plan Note (Signed)
Well controlled.  No changes to medicines. Omeprazole 20 mg daily. Continue to work on eating a healthy diet and exercise as tolerated.

## 2023-01-09 NOTE — Progress Notes (Signed)
Subjective:  Patient ID: Audrey Ruiz, female    DOB: Feb 26, 1950  Age: 72 y.o. MRN: [id number]  Chief Complaint  Patient presents with   Medical Management of Chronic Issues    HPI   Nonischemic cardiomyopathy thought to be due to adriamycin. CORONARY ARTERY DISEASE noncritical CAD.   CONGESTIVE HEART FAILURE - on Entresto 24-26 mg. Patient is status post BiV ICD 2022 with improved EF on her most recent echocardiogram 50 to 55%. Patient denies chest pain or shortness of breath.   Hyperlipidemia: Taking Zetia 10 mg once daily and Crestor 40 mg once daily at night. Patient tolerating medications well and is looking forward to visiting with family for the holidays. She is planning on eating healthy through the holidays.   GERD: Currently on omeprazole 20 mg once daily. No side effects and compliant with medication.   Depression, mild, recurrent: Currently on Prozac 40 mg once daily.    Insomnia: Takes melatonin and no longer take the lorazepam. She is managing her elderly (54) mother and aunt the best se can and things are much better she states and is not interesting in taking any medication for sleep. She says she actually sleeps much better now.      11/04/2022    2:26 PM 07/08/2022    1:59 PM 04/04/2022    8:52 AM 01/23/2022   10:27 AM 10/30/2021    2:30 PM  Depression screen PHQ 2/9  Decreased Interest 0 1 0 0 0  Down, Depressed, Hopeless 0 1 0 0 0  PHQ - 2 Score 0 2 0 0 0  Altered sleeping 0 0 0    Tired, decreased energy 0 1 0    Change in appetite 0 0 0    Feeling bad or failure about yourself  0 0 0    Trouble concentrating 0 0 0    Moving slowly or fidgety/restless 0 0 0    Suicidal thoughts 0 0 0    PHQ-9 Score 0 3 0    Difficult doing work/chores Not difficult at all Not difficult at all Not difficult at all          11/04/2022    1:49 PM  Fall Risk   Falls in the past year? 1  Number falls in past yr: 0  Injury with Fall? 0  Risk for fall due to :  No Fall Risks  Follow up Falls evaluation completed;Education provided    Patient Care Team: Blane Ohara, MD as PCP - General (Family Medicine) Thomasene Ripple, DO as PCP - Cardiology (Cardiology) Regan Lemming, MD as PCP - Electrophysiology (Cardiology) Weston Settle, MD as Consulting Physician (Oncology) Thomasene Ripple, DO as Consulting Physician (Cardiology) Karma Greaser, PA as Physician Assistant (Orthopedic Surgery) Albin Felling, OD (Optometry) Marlowe Sax, RN as Case Manager (General Practice)   Review of Systems  Constitutional:  Negative for appetite change, chills, fatigue and fever.  HENT:  Negative for congestion, ear pain, sinus pain and sore throat.   Respiratory:  Negative for cough and shortness of breath.   Cardiovascular:  Negative for chest pain and palpitations.  Gastrointestinal:  Negative for abdominal pain, constipation, diarrhea, nausea and vomiting.  Endocrine: Negative for polydipsia, polyphagia and polyuria.  Genitourinary:  Negative for difficulty urinating, dysuria, frequency and urgency.  Musculoskeletal:  Positive for arthralgias (knees). Negative for back pain and myalgias.  Skin:  Negative for rash.  Neurological:  Negative for headaches.  Psychiatric/Behavioral:  Negative for dysphoric mood and sleep disturbance. The patient is nervous/anxious.     Current Outpatient Medications on File Prior to Visit  Medication Sig Dispense Refill   cetirizine (ZYRTEC) 10 MG tablet Take 10 mg by mouth daily.     ENTRESTO 24-26 MG TAKE ONE TABLET BY MOUTH EVERY MORNING and TAKE ONE TABLET BY MOUTH EVERYDAY AT BEDTIME 180 tablet 3   ezetimibe (ZETIA) 10 MG tablet TAKE 1 TABLET BY MOUTH EVERY DAY AT BEDTIME 30 tablet 10   FLUoxetine (PROZAC) 40 MG capsule TAKE 1 CAPSULE BY MOUTH ONCE DAILY *REFILL REQUEST* 30 capsule 2   fluticasone (FLONASE) 50 MCG/ACT nasal spray USE TWO SPRAYS IN EACH NOSTRIL ONCE DAILY 48 g 3   Glucosamine-Chondroitin-MSM  1500-1200-500 MG PACK Take 1 tablet by mouth daily.     ibuprofen (ADVIL) 200 MG tablet Take 200 mg by mouth every 8 (eight) hours as needed for moderate pain.     levothyroxine (SYNTHROID) 25 MCG tablet TAKE 1 TABLET BY MOUTH DAILY BEFORE BREAKFAST *REFILL REQUEST* 30 tablet 2   Melatonin 10 MG CAPS Take 5 mg by mouth at bedtime.     Multiple Vitamin (MULTIVITAMIN) capsule Take 1 capsule by mouth daily.     Omega-3 Fatty Acids (FISH OIL) 1000 MG CAPS Take 1,000 mg by mouth 2 (two) times daily.     omeprazole (PRILOSEC) 20 MG capsule TAKE 1 CAPSULE BY MOUTH ONCE DAILY *REFILL REQUEST* 30 capsule 2   rosuvastatin (CRESTOR) 40 MG tablet TAKE 1 TABLET BY MOUTH EVERY DAY AT BEDTIME 30 tablet 10   Vitamin D, Cholecalciferol, 25 MCG (1000 UT) CAPS Take 1,000 Units by mouth daily.     LORazepam (ATIVAN) 0.5 MG tablet Take 1 tablet (0.5 mg total) by mouth at bedtime as needed for anxiety. (Patient not taking: Reported on 01/09/2023) 30 tablet 2   No current facility-administered medications on file prior to visit.   Past Medical History:  Diagnosis Date   Abnormal nuclear stress test 08/08/2020   Abnormal nuclear stress test 08/08/2020   Anxiety    Closed fracture of distal end of left radius 08/16/2020   Depressed left ventricular ejection fraction    Depression    Diabetes mellitus without complication (HCC)    Dilated cardiomyopathy (HCC) 08/08/2020   Encounter for Medicare annual wellness exam 10/19/2019   Gastroesophageal reflux disease 10/19/2019   GERD (gastroesophageal reflux disease)    Hydropneumothorax 02/08/2021   Hyperlipidemia    Hyperlipidemia associated with type 2 diabetes mellitus (HCC) 10/19/2019   LBBB (left bundle branch block) 08/17/2020   Mitral valve prolapse    Nonischemic cardiomyopathy (HCC) 08/17/2020   Osteopenia 10/19/2019   Sinusitis 08/16/2019   Upper respiratory tract infection due to COVID-19 virus 03/18/2021   Past Surgical History:  Procedure  Laterality Date   BIV ICD INSERTION CRT-D N/A 02/05/2021   Procedure: BIV ICD INSERTION CRT-D;  Surgeon: Regan Lemming, MD;  Location: [facility] INVASIVE CV LAB;  Service: Cardiovascular;  Laterality: N/A;   BREAST LUMPECTOMY Left 2010   CATARACT EXTRACTION BILATERAL W/ ANTERIOR VITRECTOMY  2021   EYE SURGERY Bilateral    Cataract Removal   LEFT HEART CATH AND CORONARY ANGIOGRAPHY N/A 08/08/2020   Procedure: LEFT HEART CATH AND CORONARY ANGIOGRAPHY;  Surgeon: Marykay Lex, MD;  Location: [facility] INVASIVE CV LAB;  Service: Cardiovascular;  Laterality: N/A;    Family History  Problem Relation Age of Onset   Osteoporosis Mother    Heart disease Father  Hyperlipidemia Father    Coronary artery disease Father    Diabetes Sister    Breast cancer Daughter    Diabetes Maternal Grandfather    Social History   Socioeconomic History   Marital status: Married    Spouse name: Viviann Spare   Number of children: 2   Years of education: Not on file   Highest education level: Master's degree (e.g., MA, MS, MEng, MEd, MSW, MBA)  Occupational History   Occupation: Retired  Tobacco Use   Smoking status: Never   Smokeless tobacco: Never  Vaping Use   Vaping status: Never Used  Substance and Sexual Activity   Alcohol use: Yes    Alcohol/week: 2.0 standard drinks of alcohol    Types: 2 Glasses of wine per week    Comment: occasional (white wine)   Drug use: Never   Sexual activity: Not Currently    Partners: Male  Other Topics Concern   Not on file  Social History Narrative   Son lives in [place], Daughter lives in [place]   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   Social Determinants of Health   Financial Resource Strain: Low Risk  (11/04/2022)   Overall Financial Resource Strain (CARDIA)    Difficulty of Paying Living Expenses: Not very hard  Food Insecurity: No Food Insecurity (11/04/2022)   Hunger Vital Sign    Worried About Running Out of Food in the Last Year: Never  true    Ran Out of Food in the Last Year: Never true  Transportation Needs: No Transportation Needs (11/04/2022)   PRAPARE - Administrator, Civil Service (Medical): No    Lack of Transportation (Non-Medical): No  Physical Activity: Sufficiently Active (11/04/2022)   Exercise Vital Sign    Days of Exercise per Week: 6 days    Minutes of Exercise per Session: 30 min  Stress: No Stress Concern Present (11/04/2022)   [employer] of Occupational Health - Occupational Stress Questionnaire    Feeling of Stress : Not at all  Social Connections: Socially Integrated (11/04/2022)   Social Connection and Isolation Panel [NHANES]    Frequency of Communication with Friends and Family: More than three times a week    Frequency of Social Gatherings with Friends and Family: More than three times a week    Attends Religious Services: More than 4 times per year    Active Member of [employer] or Organizations: Yes    Attends Engineer, structural: More than 4 times per year    Marital Status: Married    Objective:  BP 100/60   Pulse 81   Temp (!) 97 F (36.1 C)   Resp 14   Ht 5' 6.5" (1.689 m)   Wt 138 lb (62.6 kg)   SpO2 93%   BMI 21.94 kg/m      01/09/2023    8:25 AM 11/04/2022    2:24 PM 09/13/2022   10:30 AM  BP/Weight  Systolic BP 100 120 119  Diastolic BP 60 68 59  Wt. (Lbs) 138 138.4 136.8  BMI 21.94 kg/m2 22 kg/m2 21.75 kg/m2    Physical Exam  Diabetic Foot Exam - Simple   No data filed      Lab Results  Component Value Date   WBC 4.3 12/30/2022   HGB 12.4 12/30/2022   HCT 39.2 12/30/2022   PLT 254 12/30/2022   GLUCOSE 85 12/30/2022   CHOL 155 12/30/2022   TRIG 67 12/30/2022  HDL 86 12/30/2022   LDLCALC 56 12/30/2022   ALT 25 12/30/2022   AST 33 12/30/2022   NA 142 12/30/2022   K 4.9 12/30/2022   CL 106 12/30/2022   CREATININE 0.70 12/30/2022   BUN 12 12/30/2022   CO2 22 12/30/2022   TSH 1.639 09/13/2022   HGBA1C 5.6 12/30/2022       Assessment & Plan:    Chronic systolic congestive heart failure [facility]) Assessment & Plan: Management per Specialist The medical regimen is effective. Continue on Entresto 24-26 mg daily    Mixed hyperlipidemia Assessment & Plan: Well controlled.  No changes to medicines. Zetia 10 mg daily, rosuvastatin 40 mg daily, omega 3 fish oil 1000 mg TWICE A DAY  Continue to work on eating a healthy diet and exercise as tolerated Labs reviewed today    Gastroesophageal reflux disease without esophagitis Assessment & Plan: Well controlled.  No changes to medicines. Omeprazole 20 mg daily. Continue to work on eating a healthy diet and exercise as tolerated.   Depression, major, recurrent, mild (HCC) Assessment & Plan: Improved Continue taking Prozac 40 mg once daily. Patient not taking Lorazepam for anxiety or sleep anymore.    Adjustment insomnia Assessment & Plan: Not taking lorazepam for sleep anymore Taking melatonin as needed     Follow-up: Return in about 6 months (around 07/09/2023) for chronic.   Clayborn Bigness I Leal-Borjas,acting as a scribe for Renne Crigler, FNP.,have documented all relevant documentation on the behalf of Renne Crigler, FNP,as directed by  Renne Crigler, FNP while in the presence of Renne Crigler, FNP.  An After Visit Summary was printed and given to the patient.  Total time spent on today's visit was greater than 30 minutes, including both face-to-face time and nonface-to-face time personally spent on review of chart (labs and imaging), discussing labs and goals, discussing further work-up, treatment options, referrals to specialist if needed, reviewing outside records if pertinent, answering patient's questions, and coordinating care.   Lajuana Matte, FNP [facility] [phone]

## 2023-01-09 NOTE — Assessment & Plan Note (Signed)
Well controlled.  No changes to medicines. Zetia 10 mg daily, rosuvastatin 40 mg daily, omega 3 fish oil 1000 mg TWICE A DAY  Continue to work on eating a healthy diet and exercise as tolerated Labs reviewed today

## 2023-01-09 NOTE — Assessment & Plan Note (Signed)
Management per Specialist The medical regimen is effective. Continue on Entresto 24-26 mg daily

## 2023-01-09 NOTE — Assessment & Plan Note (Signed)
Improved Continue taking Prozac 40 mg once daily. Patient not taking Lorazepam for anxiety or sleep anymore.

## 2023-01-10 ENCOUNTER — Ambulatory Visit: Payer: PPO | Admitting: Family Medicine

## 2023-01-13 ENCOUNTER — Other Ambulatory Visit (HOSPITAL_COMMUNITY): Payer: Self-pay

## 2023-01-13 ENCOUNTER — Telehealth: Payer: Self-pay | Admitting: Cardiology

## 2023-01-13 MED ORDER — ENTRESTO 24-26 MG PO TABS: ORAL_TABLET | ORAL | 2 refills | Status: DC

## 2023-01-13 NOTE — Telephone Encounter (Signed)
Pt c/o medication issue:  1. Name of Medication:   ENTRESTO 24-26 MG   2. How are you currently taking this medication (dosage and times per day)?   As prescribed  3. Are you having a reaction (difficulty breathing--STAT)?   No  4. What is your medication issue?   Patient stated she will need assistance getting this medication.  Patient stated her mail order company went out of business and is now using [facility] which is not authorized by her insurance company.  Patient stated she has 3 days left of medication and wants advice on next steps.

## 2023-01-13 NOTE — Telephone Encounter (Signed)
Called and spoke to patient to offer information for patient assistance. Patient report she does not qualify due to her income. Patient stated medication can not be filled by her new pharmacy. Sending to pharmacy to review.

## 2023-01-13 NOTE — Telephone Encounter (Signed)
Called and spoke to patient. She would like 90 day supply sent to [facility] pharmacy. Prescription sent to pharmacy per patient's request.

## 2023-02-04 ENCOUNTER — Ambulatory Visit (INDEPENDENT_AMBULATORY_CARE_PROVIDER_SITE_OTHER): Payer: PPO

## 2023-02-04 DIAGNOSIS — I447 Left bundle-branch block, unspecified: Secondary | ICD-10-CM

## 2023-02-04 DIAGNOSIS — I5022 Chronic systolic (congestive) heart failure: Secondary | ICD-10-CM

## 2023-02-10 LAB — CUP PACEART REMOTE DEVICE CHECK
Battery Remaining Longevity: 67 mo
Battery Remaining Percentage: 73 %
Battery Voltage: 2.96 V
Brady Statistic AP VP Percent: 1 %
Brady Statistic AP VS Percent: 1.1 %
Brady Statistic AS VP Percent: 95 %
Brady Statistic AS VS Percent: 1 %
Brady Statistic RA Percent Paced: 1 %
Date Time Interrogation Session: 20241220165352
HighPow Impedance: 69 Ohm
Implantable Lead Connection Status: 753985
Implantable Lead Connection Status: 753985
Implantable Lead Connection Status: 753985
Implantable Lead Implant Date: 20221219
Implantable Lead Implant Date: 20221219
Implantable Lead Implant Date: 20221219
Implantable Lead Location: 753858
Implantable Lead Location: 753859
Implantable Lead Location: 753860
Implantable Pulse Generator Implant Date: 20221219
Lead Channel Impedance Value: 300 Ohm
Lead Channel Impedance Value: 410 Ohm
Lead Channel Impedance Value: 690 Ohm
Lead Channel Pacing Threshold Amplitude: 0.75 V
Lead Channel Pacing Threshold Amplitude: 0.75 V
Lead Channel Pacing Threshold Amplitude: 1.5 V
Lead Channel Pacing Threshold Pulse Width: 0.5 ms
Lead Channel Pacing Threshold Pulse Width: 0.5 ms
Lead Channel Pacing Threshold Pulse Width: 0.5 ms
Lead Channel Sensing Intrinsic Amplitude: 1.5 mV
Lead Channel Sensing Intrinsic Amplitude: 11.5 mV
Lead Channel Setting Pacing Amplitude: 1.75 V
Lead Channel Setting Pacing Amplitude: 2 V
Lead Channel Setting Pacing Amplitude: 2 V
Lead Channel Setting Pacing Pulse Width: 0.5 ms
Lead Channel Setting Pacing Pulse Width: 0.5 ms
Lead Channel Setting Sensing Sensitivity: 0.5 mV
Pulse Gen Serial Number: 111053351
Zone Setting Status: 755011

## 2023-02-27 ENCOUNTER — Ambulatory Visit (INDEPENDENT_AMBULATORY_CARE_PROVIDER_SITE_OTHER): Payer: PPO

## 2023-02-27 VITALS — BP 120/70 | HR 75 | Temp 97.2°F | Ht 66.5 in | Wt 140.0 lb

## 2023-02-27 DIAGNOSIS — R300 Dysuria: Secondary | ICD-10-CM | POA: Diagnosis not present

## 2023-02-27 DIAGNOSIS — J029 Acute pharyngitis, unspecified: Secondary | ICD-10-CM | POA: Diagnosis not present

## 2023-02-27 LAB — POCT URINALYSIS DIP (CLINITEK)
Bilirubin, UA: NEGATIVE
Blood, UA: NEGATIVE
Glucose, UA: NEGATIVE mg/dL
Ketones, POC UA: NEGATIVE mg/dL
Leukocytes, UA: NEGATIVE
Nitrite, UA: NEGATIVE
POC PROTEIN,UA: NEGATIVE
Spec Grav, UA: 1.015 (ref 1.010–1.025)
Urobilinogen, UA: 0.2 U/dL
pH, UA: 6 (ref 5.0–8.0)

## 2023-02-27 NOTE — Progress Notes (Signed)
 Acute Office Visit  Subjective:    Patient ID: Audrey Ruiz, female    DOB: 27-Jun-1950, 73 y.o.   MRN: [id number]  Chief Complaint  Patient presents with   Cough    Discussed the use of AI scribe software for clinical note transcription with the patient, who gave verbal consent to proceed.      HPI: Patient is in today for states she has dysuria that comes and goes. Sore throat over the weekend, mild cough. Fell asleep with cough drop in her mouth Sunday night since then hurts to swallow. Denies fever/chills/bodyaches The patient presented with a chief complaint of a persistent sore throat that began over the weekend. She reported that the discomfort started as a scratchy sensation and has since evolved to a feeling of lumps in the throat, particularly in the mid-neck region. The patient also noted a soreness in the upper chest. She denied any significant cough but admitted to using cough drops for the sore throat.  The patient also reported a change in the color and odor of her urine, although she acknowledged that she might not be drinking enough water. She denied any signs of infection in the urine sample provided.  The patient has a history of using cetirizine  and Flonase  nasal spray daily, which she reported as effective in managing her symptoms. She also mentioned a history of earwax build-up, which she has been managing with Debrox ear drops.  The patient works with elderly individuals and expressed concern about potentially transmitting an infection to them. She requested testing for RSV, although she was informed that the window for antiviral treatment may have passed.  The patient's medication regimen includes daily cetirizine  and Flonase  nasal spray, which she reported as effective in managing her symptoms.  Past Medical History:  Diagnosis Date   Abnormal nuclear stress test 08/08/2020   Abnormal nuclear stress test 08/08/2020   Anxiety    Closed fracture  of distal end of left radius 08/16/2020   Depressed left ventricular ejection fraction    Depression    Diabetes mellitus without complication (HCC)    Dilated cardiomyopathy (HCC) 08/08/2020   Encounter for Medicare annual wellness exam 10/19/2019   Gastroesophageal reflux disease 10/19/2019   GERD (gastroesophageal reflux disease)    Hydropneumothorax 02/08/2021   Hyperlipidemia    Hyperlipidemia associated with type 2 diabetes mellitus (HCC) 10/19/2019   LBBB (left bundle branch block) 08/17/2020   Mitral valve prolapse    Nonischemic cardiomyopathy (HCC) 08/17/2020   Osteopenia 10/19/2019   Sinusitis 08/16/2019   Upper respiratory tract infection due to COVID-19 virus 03/18/2021    Past Surgical History:  Procedure Laterality Date   BIV ICD INSERTION CRT-D N/A 02/05/2021   Procedure: BIV ICD INSERTION CRT-D;  Surgeon: Inocencio Soyla Lunger, MD;  Location: [facility] INVASIVE CV LAB;  Service: Cardiovascular;  Laterality: N/A;   BREAST LUMPECTOMY Left 2010   CATARACT EXTRACTION BILATERAL W/ ANTERIOR VITRECTOMY  2021   EYE SURGERY Bilateral    Cataract Removal   LEFT HEART CATH AND CORONARY ANGIOGRAPHY N/A 08/08/2020   Procedure: LEFT HEART CATH AND CORONARY ANGIOGRAPHY;  Surgeon: Anner Alm ORN, MD;  Location: [facility] INVASIVE CV LAB;  Service: Cardiovascular;  Laterality: N/A;    Family History  Problem Relation Age of Onset   Osteoporosis Mother    Heart disease Father    Hyperlipidemia Father    Coronary artery disease Father    Diabetes Sister    Breast cancer Daughter  Diabetes Maternal Grandfather     Social History   Socioeconomic History   Marital status: Married    Spouse name: Elspeth   Number of children: 2   Years of education: Not on file   Highest education level: Master's degree (e.g., MA, MS, MEng, MEd, MSW, MBA)  Occupational History   Occupation: Retired  Tobacco Use   Smoking status: Never   Smokeless tobacco: Never  Vaping Use   Vaping status: Never  Used  Substance and Sexual Activity   Alcohol use: Yes    Alcohol/week: 2.0 standard drinks of alcohol    Types: 2 Glasses of wine per week    Comment: occasional (white wine)   Drug use: Never   Sexual activity: Not Currently    Partners: Male  Other Topics Concern   Not on file  Social History Narrative   Son lives in [place], Daughter lives in [place]   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   Social Drivers of Health   Financial Resource Strain: Low Risk  (11/04/2022)   Overall Financial Resource Strain (CARDIA)    Difficulty of Paying Living Expenses: Not very hard  Food Insecurity: No Food Insecurity (11/04/2022)   Hunger Vital Sign    Worried About Running Out of Food in the Last Year: Never true    Ran Out of Food in the Last Year: Never true  Transportation Needs: No Transportation Needs (11/04/2022)   PRAPARE - Administrator, Civil Service (Medical): No    Lack of Transportation (Non-Medical): No  Physical Activity: Sufficiently Active (11/04/2022)   Exercise Vital Sign    Days of Exercise per Week: 6 days    Minutes of Exercise per Session: 30 min  Stress: No Stress Concern Present (11/04/2022)   [employer] of Occupational Health - Occupational Stress Questionnaire    Feeling of Stress : Not at all  Social Connections: Socially Integrated (11/04/2022)   Social Connection and Isolation Panel [NHANES]    Frequency of Communication with Friends and Family: More than three times a week    Frequency of Social Gatherings with Friends and Family: More than three times a week    Attends Religious Services: More than 4 times per year    Active Member of [employer] or Organizations: Yes    Attends Engineer, Structural: More than 4 times per year    Marital Status: Married  Catering Manager Violence: Not At Risk (11/04/2022)   Humiliation, Afraid, Rape, and Kick questionnaire    Fear of Current or Ex-Partner: No    Emotionally Abused: No     Physically Abused: No    Sexually Abused: No    Outpatient Medications Prior to Visit  Medication Sig Dispense Refill   cetirizine  (ZYRTEC ) 10 MG tablet Take 10 mg by mouth daily.     ezetimibe  (ZETIA ) 10 MG tablet TAKE 1 TABLET BY MOUTH EVERY DAY AT BEDTIME 30 tablet 10   FLUoxetine  (PROZAC ) 40 MG capsule TAKE 1 CAPSULE BY MOUTH ONCE DAILY *REFILL REQUEST* 30 capsule 2   fluticasone  (FLONASE ) 50 MCG/ACT nasal spray USE TWO SPRAYS IN EACH NOSTRIL ONCE DAILY 48 g 3   Glucosamine-Chondroitin-MSM 1500-1200-500 MG PACK Take 1 tablet by mouth daily.     ibuprofen  (ADVIL ) 200 MG tablet Take 200 mg by mouth every 8 (eight) hours as needed for moderate pain.     levothyroxine  (SYNTHROID ) 25 MCG tablet TAKE 1 TABLET BY MOUTH DAILY BEFORE BREAKFAST *REFILL  REQUEST* 30 tablet 2   Melatonin 10 MG CAPS Take 5 mg by mouth at bedtime.     Multiple Vitamin (MULTIVITAMIN) capsule Take 1 capsule by mouth daily.     Omega-3 Fatty Acids (FISH OIL) 1000 MG CAPS Take 1,000 mg by mouth 2 (two) times daily.     omeprazole  (PRILOSEC) 20 MG capsule TAKE 1 CAPSULE BY MOUTH ONCE DAILY *REFILL REQUEST* 30 capsule 2   rosuvastatin  (CRESTOR ) 40 MG tablet TAKE 1 TABLET BY MOUTH EVERY DAY AT BEDTIME 30 tablet 10   sacubitril -valsartan  (ENTRESTO ) 24-26 MG TAKE ONE TABLET BY MOUTH EVERY MORNING and TAKE ONE TABLET BY MOUTH EVERYDAY AT BEDTIME. 180 tablet 2   Vitamin D , Cholecalciferol , 25 MCG (1000 UT) CAPS Take 1,000 Units by mouth daily.     No facility-administered medications prior to visit.    No Known Allergies  Review of Systems  Constitutional:  Negative for chills, fatigue and fever.  HENT:  Positive for sore throat. Negative for congestion, ear pain, postnasal drip, rhinorrhea, sinus pressure and sinus pain.   Respiratory:  Positive for cough. Negative for shortness of breath.   Cardiovascular:  Negative for chest pain.  Gastrointestinal:  Negative for diarrhea, nausea and vomiting.  Genitourinary:   Positive for dysuria.  Neurological:  Negative for dizziness and headaches.       Objective:        02/27/2023    2:10 PM 01/09/2023    8:25 AM 11/04/2022    2:24 PM  Vitals with BMI  Height 5' 6.5 5' 6.5 5' 6.5  Weight 140 lbs 138 lbs 138 lbs 6 oz  BMI 22.26 21.94 22.01  Systolic 120 100 879  Diastolic 70 60 68  Pulse 75 81 72    No data found.   Physical Exam Vitals and nursing note reviewed.  Constitutional:      Appearance: Normal appearance.  HENT:     Head: Normocephalic and atraumatic.     Mouth/Throat:     Mouth: Mucous membranes are moist.     Comments: Whitish sores noted on the posterior pharyngeal wall with some erythema around Cardiovascular:     Rate and Rhythm: Normal rate and regular rhythm.  Pulmonary:     Effort: Pulmonary effort is normal.     Breath sounds: Normal breath sounds.  Musculoskeletal:        General: Normal range of motion.     Cervical back: Normal range of motion.  Neurological:     General: No focal deficit present.     Mental Status: She is alert.  Psychiatric:        Mood and Affect: Mood normal.        Health Maintenance Due  Topic Date Due   FOOT EXAM  Never done   OPHTHALMOLOGY EXAM  Never done   Diabetic kidney evaluation - Urine ACR  Never done   Zoster Vaccines- Shingrix (1 of 2) Never done   DTaP/Tdap/Td (2 - Td or Tdap) 12/01/2022    There are no preventive care reminders to display for this patient.   Lab Results  Component Value Date   TSH 1.639 09/13/2022   Lab Results  Component Value Date   WBC 4.3 12/30/2022   HGB 12.4 12/30/2022   HCT 39.2 12/30/2022   MCV 99 (H) 12/30/2022   PLT 254 12/30/2022   Lab Results  Component Value Date   NA 142 12/30/2022   K 4.9 12/30/2022   CO2 22 12/30/2022  GLUCOSE 85 12/30/2022   BUN 12 12/30/2022   CREATININE 0.70 12/30/2022   BILITOT 0.9 12/30/2022   ALKPHOS 62 12/30/2022   AST 33 12/30/2022   ALT 25 12/30/2022   PROT 6.4 12/30/2022   ALBUMIN  4.4 12/30/2022   CALCIUM  10.5 (H) 12/30/2022   ANIONGAP 8 04/02/2022   EGFR 91 12/30/2022   Lab Results  Component Value Date   CHOL 155 12/30/2022   Lab Results  Component Value Date   HDL 86 12/30/2022   Lab Results  Component Value Date   LDLCALC 56 12/30/2022   Lab Results  Component Value Date   TRIG 67 12/30/2022   Lab Results  Component Value Date   CHOLHDL 1.8 12/30/2022   Lab Results  Component Value Date   HGBA1C 5.6 12/30/2022       Assessment & Plan:  Dysuria Assessment & Plan: Negative UA in office today Encouraged hydration  Orders: -     POCT URINALYSIS DIP (CLINITEK)  Sore throat Assessment & Plan: Reports sore throat starting Saturday or Sunday, exacerbated by a cough drop incident. Discomfort in upper chest and white spots on tonsils noted. Differential diagnosis includes streptococcal pharyngitis and oral thrush. Concerned about potential infection due to close contact with elderly family members. Informed that testing for flu, COVID, or RSV is only beneficial within the first five days of symptoms for antiviral treatment. Past the window for treatment, symptoms expected to improve unless worsening indicates bacterial infection or pneumonia. - Perform strep swab- WHICH WAS NEGATIVE  - since strep swab is negative, prescribed nystatin  swish and swallow four times daily for a week - Advise increased water intake - Schedule follow-up appointment in one week to reassess throat condition   Ear Wax Impaction Significant ear wax buildup, partially removed during visit.- NO CHARGE FOR THIS, - Advise continued use of Debrox ear drops - Reassess ear condition at follow-up appointment  General Health Maintenance Currently taking cetirizine  (Zyrtec ) and using Flonase  nasal spray daily to manage allergy symptoms. No signs of urinary tract infection in urine sample provided. - Encourage adequate water intake to prevent concentrated urine  Follow-up -  Schedule follow-up appointment in one week.   Other orders -     Nystatin ; Take 5 mLs (500,000 Units total) by mouth 4 (four) times daily for 5 days. Swish and swallow  Dispense: 60 mL; Refill: 0     Meds ordered this encounter  Medications   nystatin  (MYCOSTATIN ) 100000 UNIT/ML suspension    Sig: Take 5 mLs (500,000 Units total) by mouth 4 (four) times daily for 5 days. Swish and swallow    Dispense:  60 mL    Refill:  0    Orders Placed This Encounter  Procedures   POCT URINALYSIS DIP (CLINITEK)     Follow-up: Return in about 1 week (around 03/06/2023).  An After Visit Summary was printed and given to the patient.  Kaysea Raya, MD [facility] [phone]

## 2023-02-28 ENCOUNTER — Telehealth: Payer: Self-pay

## 2023-02-28 MED ORDER — NYSTATIN 100000 UNIT/ML MT SUSP: 5.0000 mL | Freq: Four times a day (QID) | OROMUCOSAL | 0 refills | Status: DC

## 2023-02-28 NOTE — Assessment & Plan Note (Signed)
 Negative UA in office today Encouraged hydration

## 2023-02-28 NOTE — Telephone Encounter (Signed)
 Patient called and stated that a mouth rinse and antibiotic was suppose to be called to pharmacy yesterday but pharmacy has not receive anything. Please advise, Pharmacy is [facility] in [place]

## 2023-02-28 NOTE — Telephone Encounter (Signed)
 Copied from CRM [id number]. Topic: Clinical - Prescription Issue >> Feb 27, 2023  5:03 PM Audrey Ruiz wrote: Reason for CRM: The patient states that [name], [name], MD prescribed her an oral thrush mouthwash at her appointment today but I can not find it under medications or in her after visit summary. The patient's call back number is [phone].  Preferred pharmacy:  [facility] [address], [place] - [address]

## 2023-02-28 NOTE — Assessment & Plan Note (Addendum)
 Reports sore throat starting Saturday or Sunday, exacerbated by a cough drop incident. Discomfort in upper chest and white spots on tonsils noted. Differential diagnosis includes streptococcal pharyngitis and oral thrush. Concerned about potential infection due to close contact with elderly family members. Informed that testing for flu, COVID, or RSV is only beneficial within the first five days of symptoms for antiviral treatment. Past the window for treatment, symptoms expected to improve unless worsening indicates bacterial infection or pneumonia. - Perform strep swab- WHICH WAS NEGATIVE  - since strep swab is negative, prescribed nystatin  swish and swallow four times daily for a week - Advise increased water intake - Schedule follow-up appointment in one week to reassess throat condition   Ear Wax Impaction Significant ear wax buildup, partially removed during visit.- NO CHARGE FOR THIS, - Advise continued use of Debrox ear drops - Reassess ear condition at follow-up appointment  General Health Maintenance Currently taking cetirizine  (Zyrtec ) and using Flonase  nasal spray daily to manage allergy symptoms. No signs of urinary tract infection in urine sample provided. - Encourage adequate water intake to prevent concentrated urine  Follow-up - Schedule follow-up appointment in one week.

## 2023-03-03 ENCOUNTER — Other Ambulatory Visit: Payer: Self-pay | Admitting: Family Medicine

## 2023-03-03 MED ORDER — NYSTATIN 100000 UNIT/ML MT SUSP: 5.0000 mL | Freq: Four times a day (QID) | OROMUCOSAL | 0 refills | Status: AC

## 2023-03-03 NOTE — Telephone Encounter (Signed)
 Copied from CRM [id number]. Topic: Clinical - Medication Question >> Feb 28, 2023 10:57 AM Audrey Ruiz wrote: Reason for CRM: [facility] is calling stating that patient need for rx please resend rx if any question call [phone]

## 2023-03-04 ENCOUNTER — Other Ambulatory Visit: Payer: Self-pay | Admitting: Family Medicine

## 2023-03-04 ENCOUNTER — Other Ambulatory Visit: Payer: Self-pay | Admitting: Cardiology

## 2023-03-04 DIAGNOSIS — K219 Gastro-esophageal reflux disease without esophagitis: Secondary | ICD-10-CM

## 2023-03-04 DIAGNOSIS — E038 Other specified hypothyroidism: Secondary | ICD-10-CM

## 2023-03-06 ENCOUNTER — Other Ambulatory Visit: Payer: Self-pay | Admitting: Family Medicine

## 2023-03-06 DIAGNOSIS — E038 Other specified hypothyroidism: Secondary | ICD-10-CM

## 2023-03-06 DIAGNOSIS — K219 Gastro-esophageal reflux disease without esophagitis: Secondary | ICD-10-CM

## 2023-03-06 MED ORDER — EZETIMIBE 10 MG PO TABS: 10.0000 mg | ORAL_TABLET | Freq: Every day | ORAL | 3 refills | Status: DC

## 2023-03-06 MED ORDER — FLUTICASONE PROPIONATE 50 MCG/ACT NA SUSP: NASAL | 3 refills | Status: AC

## 2023-03-06 MED ORDER — LEVOTHYROXINE SODIUM 25 MCG PO TABS: 25.0000 ug | ORAL_TABLET | Freq: Every day | ORAL | 3 refills | Status: DC

## 2023-03-06 MED ORDER — CETIRIZINE HCL 10 MG PO TABS: 10.0000 mg | ORAL_TABLET | Freq: Every day | ORAL | 3 refills | Status: DC

## 2023-03-06 MED ORDER — OMEPRAZOLE 20 MG PO CPDR: 20.0000 mg | DELAYED_RELEASE_CAPSULE | Freq: Every day | ORAL | 3 refills | Status: DC

## 2023-03-06 MED ORDER — FLUOXETINE HCL 40 MG PO CAPS: 40.0000 mg | ORAL_CAPSULE | Freq: Every day | ORAL | 3 refills | Status: DC

## 2023-03-07 ENCOUNTER — Telehealth: Payer: Self-pay | Admitting: Cardiology

## 2023-03-07 ENCOUNTER — Telehealth: Payer: Self-pay | Admitting: *Deleted

## 2023-03-07 ENCOUNTER — Other Ambulatory Visit: Payer: Self-pay | Admitting: *Deleted

## 2023-03-07 ENCOUNTER — Ambulatory Visit (INDEPENDENT_AMBULATORY_CARE_PROVIDER_SITE_OTHER): Payer: PPO

## 2023-03-07 VITALS — BP 100/76 | HR 80 | Temp 97.0°F | Ht 66.5 in | Wt 138.8 lb

## 2023-03-07 DIAGNOSIS — H6122 Impacted cerumen, left ear: Secondary | ICD-10-CM | POA: Diagnosis not present

## 2023-03-07 DIAGNOSIS — B370 Candidal stomatitis: Secondary | ICD-10-CM | POA: Insufficient documentation

## 2023-03-07 NOTE — Progress Notes (Signed)
Acute Office Visit  Subjective:    Patient ID: Audrey Ruiz, female    DOB: 1950-10-26, 73 y.o.   MRN: [id number]  Chief Complaint  Patient presents with   Sore Throat   Ear Problem    Discussed the use of AI scribe software for clinical note transcription with the patient, who gave verbal consent to proceed.      HPI: Patient is in today for recheck on throat and ears. Patient states she feels over all, mouth wash worked well, has about one time left per patient.  The patient, with a history of breast cancer and recent diagnosis of oral thrush, presented for a follow-up visit after being prescribed nystatin swish and swallow. She reported that the sore throat symptoms had resolved, but a persistent cough remained. The patient was unsure if the oral spots had disappeared, but noted no bleeding upon brushing the tongue.  The patient also reported a recent issue with urinary symptoms, which had improved after increasing water intake. She noted that the urine had been smelly and discolored prior to increasing fluid intake.  The patient also mentioned a recurring issue with ear wax build-up, particularly in the left ear. She reported occasional soreness in the ear, but no significant pain. The patient had previously had wax removed from the ears and was advised to use Debrox drops to manage the issue.  The patient is also due for a tetanus shot and has not yet received the shingles vaccine, despite having had shingles twice in the past. She is due for a mammogram and has an upcoming oncology appointment related to a history of breast cancer.  Past Medical History:  Diagnosis Date   Abnormal nuclear stress test 08/08/2020   Abnormal nuclear stress test 08/08/2020   Anxiety    Closed fracture of distal end of left radius 08/16/2020   Depressed left ventricular ejection fraction    Depression    Diabetes mellitus without complication (HCC)    Dilated cardiomyopathy (HCC)  08/08/2020   Encounter for Medicare annual wellness exam 10/19/2019   Gastroesophageal reflux disease 10/19/2019   GERD (gastroesophageal reflux disease)    Hydropneumothorax 02/08/2021   Hyperlipidemia    Hyperlipidemia associated with type 2 diabetes mellitus (HCC) 10/19/2019   LBBB (left bundle branch block) 08/17/2020   Mitral valve prolapse    Nonischemic cardiomyopathy (HCC) 08/17/2020   Osteopenia 10/19/2019   Sinusitis 08/16/2019   Upper respiratory tract infection due to COVID-19 virus 03/18/2021    Past Surgical History:  Procedure Laterality Date   BIV ICD INSERTION CRT-D N/A 02/05/2021   Procedure: BIV ICD INSERTION CRT-D;  Surgeon: Regan Lemming, MD;  Location: [facility] INVASIVE CV LAB;  Service: Cardiovascular;  Laterality: N/A;   BREAST LUMPECTOMY Left 2010   CATARACT EXTRACTION BILATERAL W/ ANTERIOR VITRECTOMY  2021   EYE SURGERY Bilateral    Cataract Removal   LEFT HEART CATH AND CORONARY ANGIOGRAPHY N/A 08/08/2020   Procedure: LEFT HEART CATH AND CORONARY ANGIOGRAPHY;  Surgeon: Marykay Lex, MD;  Location: [facility] INVASIVE CV LAB;  Service: Cardiovascular;  Laterality: N/A;    Family History  Problem Relation Age of Onset   Osteoporosis Mother    Heart disease Father    Hyperlipidemia Father    Coronary artery disease Father    Diabetes Sister    Breast cancer Daughter    Diabetes Maternal Grandfather     Social History   Socioeconomic History   Marital status: Married  Spouse name: Viviann Spare   Number of children: 2   Years of education: Not on file   Highest education level: Master's degree (e.g., MA, MS, MEng, MEd, MSW, MBA)  Occupational History   Occupation: Retired  Tobacco Use   Smoking status: Never   Smokeless tobacco: Never  Vaping Use   Vaping status: Never Used  Substance and Sexual Activity   Alcohol use: Yes    Alcohol/week: 2.0 standard drinks of alcohol    Types: 2 Glasses of wine per week    Comment: occasional (white wine)    Drug use: Never   Sexual activity: Not Currently    Partners: Male  Other Topics Concern   Not on file  Social History Narrative   Son lives in [place], Daughter lives in [place]   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   Social Drivers of Health   Financial Resource Strain: Low Risk  (11/04/2022)   Overall Financial Resource Strain (CARDIA)    Difficulty of Paying Living Expenses: Not very hard  Food Insecurity: No Food Insecurity (11/04/2022)   Hunger Vital Sign    Worried About Running Out of Food in the Last Year: Never true    Ran Out of Food in the Last Year: Never true  Transportation Needs: No Transportation Needs (11/04/2022)   PRAPARE - Administrator, Civil Service (Medical): No    Lack of Transportation (Non-Medical): No  Physical Activity: Sufficiently Active (11/04/2022)   Exercise Vital Sign    Days of Exercise per Week: 6 days    Minutes of Exercise per Session: 30 min  Stress: No Stress Concern Present (11/04/2022)   [employer] of Occupational Health - Occupational Stress Questionnaire    Feeling of Stress : Not at all  Social Connections: Socially Integrated (11/04/2022)   Social Connection and Isolation Panel [NHANES]    Frequency of Communication with Friends and Family: More than three times a week    Frequency of Social Gatherings with Friends and Family: More than three times a week    Attends Religious Services: More than 4 times per year    Active Member of [employer] or Organizations: Yes    Attends Engineer, structural: More than 4 times per year    Marital Status: Married  Catering manager Violence: Not At Risk (11/04/2022)   Humiliation, Afraid, Rape, and Kick questionnaire    Fear of Current or Ex-Partner: No    Emotionally Abused: No    Physically Abused: No    Sexually Abused: No    Outpatient Medications Prior to Visit  Medication Sig Dispense Refill   cetirizine (ALLERGY RELIEF CETIRIZINE) 10 MG tablet  Take 1 tablet (10 mg total) by mouth at bedtime. 90 tablet 3   ezetimibe (ZETIA) 10 MG tablet Take 1 tablet (10 mg total) by mouth at bedtime. 90 tablet 3   FLUoxetine (PROZAC) 40 MG capsule Take 1 capsule (40 mg total) by mouth daily. 90 capsule 3   fluticasone (FLONASE) 50 MCG/ACT nasal spray USE 2 SPRAYS INTO EACH NOSTRIL ONCE DAILY *NEW PRESCRIPTION REQUEST* 48 g 3   Glucosamine-Chondroitin-MSM 1500-1200-500 MG PACK Take 1 tablet by mouth daily.     ibuprofen (ADVIL) 200 MG tablet Take 200 mg by mouth every 8 (eight) hours as needed for moderate pain.     levothyroxine (SYNTHROID) 25 MCG tablet Take 1 tablet (25 mcg total) by mouth daily before breakfast. 90 tablet 3   Melatonin 10 MG  CAPS Take 5 mg by mouth at bedtime.     Multiple Vitamin (MULTIVITAMIN) capsule Take 1 capsule by mouth daily.     nystatin (MYCOSTATIN) 100000 UNIT/ML suspension Take 5 mLs (500,000 Units total) by mouth 4 (four) times daily for 5 days. Swish and swallow 100 mL 0   Omega-3 Fatty Acids (FISH OIL) 1000 MG CAPS Take 1,000 mg by mouth 2 (two) times daily.     omeprazole (PRILOSEC) 20 MG capsule Take 1 capsule (20 mg total) by mouth daily. 90 capsule 3   rosuvastatin (CRESTOR) 40 MG tablet TAKE 1 TABLET BY MOUTH EVERY DAY AT BEDTIME 30 tablet 10   sacubitril-valsartan (ENTRESTO) 24-26 MG TAKE 1 TABLET BY MOUTH IN THE MORNING AND 1 AT BEDTIME *NEW PRESCRIPTION REQUEST* 60 tablet 11   Vitamin D, Cholecalciferol, 25 MCG (1000 UT) CAPS Take 1,000 Units by mouth daily.     No facility-administered medications prior to visit.    No Known Allergies  Review of Systems  Constitutional:  Negative for chills, fatigue and fever.  HENT:  Negative for congestion, ear pain and sinus pain.        Ear plugging bilateral  Respiratory:  Negative for cough and shortness of breath.   Cardiovascular:  Negative for chest pain.  Gastrointestinal:  Negative for abdominal pain, constipation, diarrhea, nausea and vomiting.   Musculoskeletal:  Negative for myalgias.  Neurological:  Negative for headaches.       Objective:        03/07/2023    9:13 AM 02/27/2023    2:10 PM 01/09/2023    8:25 AM  Vitals with BMI  Height 5' 6.5" 5' 6.5" 5' 6.5"  Weight 138 lbs 13 oz 140 lbs 138 lbs  BMI 22.07 22.26 21.94  Systolic 100 120 413  Diastolic 76 70 60  Pulse 80 75 81    No data found.   Physical Exam Vitals reviewed.  HENT:     Head: Normocephalic.     Ears:     Comments: Cerumen left ear canal. Could not be extracted with plastic curette Normal appearing right ear canal and ear drum    Mouth/Throat:     Mouth: Mucous membranes are moist. No oral lesions.     Pharynx: Oropharynx is clear. No pharyngeal swelling, oropharyngeal exudate or posterior oropharyngeal erythema.  Cardiovascular:     Rate and Rhythm: Normal rate.  Musculoskeletal:     Cervical back: Normal range of motion.  Neurological:     Mental Status: She is alert.     Health Maintenance Due  Topic Date Due   FOOT EXAM  Never done   OPHTHALMOLOGY EXAM  Never done   Diabetic kidney evaluation - Urine ACR  Never done   Zoster Vaccines- Shingrix (1 of 2) Never done   DTaP/Tdap/Td (2 - Td or Tdap) 12/01/2022   COVID-19 Vaccine (6 - 2024-25 season) 03/06/2023    There are no preventive care reminders to display for this patient.   Lab Results  Component Value Date   TSH 1.639 09/13/2022   Lab Results  Component Value Date   WBC 4.3 12/30/2022   HGB 12.4 12/30/2022   HCT 39.2 12/30/2022   MCV 99 (H) 12/30/2022   PLT 254 12/30/2022   Lab Results  Component Value Date   NA 142 12/30/2022   K 4.9 12/30/2022   CO2 22 12/30/2022   GLUCOSE 85 12/30/2022   BUN 12 12/30/2022   CREATININE 0.70 12/30/2022   BILITOT  0.9 12/30/2022   ALKPHOS 62 12/30/2022   AST 33 12/30/2022   ALT 25 12/30/2022   PROT 6.4 12/30/2022   ALBUMIN 4.4 12/30/2022   CALCIUM 10.5 (H) 12/30/2022   ANIONGAP 8 04/02/2022   EGFR 91 12/30/2022    Lab Results  Component Value Date   CHOL 155 12/30/2022   Lab Results  Component Value Date   HDL 86 12/30/2022   Lab Results  Component Value Date   LDLCALC 56 12/30/2022   Lab Results  Component Value Date   TRIG 67 12/30/2022   Lab Results  Component Value Date   CHOLHDL 1.8 12/30/2022   Lab Results  Component Value Date   HGBA1C 5.6 12/30/2022       Assessment & Plan:  Oral thrush Assessment & Plan: Presented with sore throat and suspected oral thrush on January 9th. Nystatin swish and swallow was prescribed. Symptoms of sore throat have resolved. Examination today shows no signs of thrush.  Tongue looks coated, explained how to differentiate between normal coated tongue and thrush. Advised to monitor for symptoms like dysphagia or retrosternal burning, which could indicate esophageal involvement. Discussed that the only confirmative way to diagnose esophageal thrush is through an upper GI endoscopy, but it is not currently necessary. - Discontinue nystatin swish and swallow - Monitor for symptoms of esophageal involvement - Consider upper GI endoscopy if symptoms persist   Impacted cerumen, left ear Assessment & Plan: Reports soreness in the left ear and presence of cerumen. Examination reveals significant cerumen in the left ear. Discussed using Debrox drops to help clear the cerumen and advised to return if the issue persists. - Use Debrox drops every three hours in the left ear - Return for further ear cleaning if necessary   Resolved Urinary Symptoms Reported urinary symptoms previously, which have resolved after increasing water intake. No infection was found in the urine sample from the last visit. - Continue increased water intake  General Health Maintenance Overdue for tetanus and shingles vaccines. Last tetanus shot was in October 2014. Has had shingles twice, with episodes on the face and side. Discussed that the shingles vaccine prevents future  recurrences and reduces the severity and duration of shingles pain. - Get tetanus shot at the pharmacy - Get shingles vaccine at the pharmacy  Follow-up - Attend Medicare wellness visit via telephone - Attend oncology follow-up on January 28th - Attend annual visit with Dr. Sedalia Muta on May 21st.      No orders of the defined types were placed in this encounter.   No orders of the defined types were placed in this encounter.    Follow-up: No follow-ups on file.  An After Visit Summary was printed and given to the patient.  Windell Moment, MD [facility] [phone]

## 2023-03-07 NOTE — Assessment & Plan Note (Addendum)
Reports soreness in the left ear and presence of cerumen. Examination reveals significant cerumen in the left ear. Discussed using Debrox drops to help clear the cerumen and advised to return if the issue persists. - Use Debrox drops every three hours in the left ear - Return for further ear cleaning if necessary   Resolved Urinary Symptoms Reported urinary symptoms previously, which have resolved after increasing water intake. No infection was found in the urine sample from the last visit. - Continue increased water intake  General Health Maintenance Overdue for tetanus and shingles vaccines. Last tetanus shot was in October 2014. Has had shingles twice, with episodes on the face and side. Discussed that the shingles vaccine prevents future recurrences and reduces the severity and duration of shingles pain. - Get tetanus shot at the pharmacy - Get shingles vaccine at the pharmacy  Follow-up - Attend Medicare wellness visit via telephone - Attend oncology follow-up on January 28th - Attend annual visit with Dr. Sedalia Muta on May 21st.

## 2023-03-07 NOTE — Telephone Encounter (Signed)
*  STAT* If patient is at the pharmacy, call can be transferred to refill team.   1. Which medications need to be refilled? (please list name of each medication and dose if known)   rosuvastatin (CRESTOR) 40 MG tablet    2. Would you like to learn more about the convenience, safety, & potential cost savings by using the [facility]?   3. Are you open to using the [facility] (Type [facility]. ).  4. Which pharmacy/location (including street and city if local pharmacy) is medication to be sent to?    [facility] - [address] [place] - [address]   5. Do they need a 30 day or 90 day supply?   90 day  Caller (Devan) stated patient is completely out of this medication.

## 2023-03-07 NOTE — Patient Outreach (Signed)
  Care Management   Follow Up Note   03/07/2023 Name: Carmindy Rochlin MRN: [id number] DOB: 1950/09/19   Referred by: Blane Ohara, MD Reason for referral : Care Management (RNCM: ATTEMPT #1 Follow Up For Chronic Disease Management & Care Coordination Needs)   An unsuccessful telephone outreach was attempted today. The patient was referred to the case management team for assistance with care management and care coordination.   Follow Up Plan: The care management team will reach out to the patient again over the next 30 days.   Rosalene Billings, BSN RN [facility], [facility] RN Care Manager Direct Dial: [phone]  Fax: [phone]

## 2023-03-07 NOTE — Patient Instructions (Signed)
VISIT SUMMARY:  During your follow-up visit, we discussed the resolution of your sore throat symptoms related to oral thrush, persistent cough, recent urinary symptoms, and ear wax build-up. We also reviewed your need for tetanus and shingles vaccines, and upcoming appointments for general health maintenance.  YOUR PLAN:  -ORAL THRUSH: Oral thrush is a fungal infection in the mouth. Your sore throat symptoms have resolved, and there are no signs of thrush upon examination. You should discontinue the nystatin swish and swallow and monitor for any symptoms like difficulty swallowing or chest pain, which could indicate a more serious infection.  -CERUMEN IMPACTION: Cerumen impaction is a build-up of ear wax. You have significant wax in your left ear causing soreness. Use Debrox drops every three hours in the left ear to help clear the wax, and return if the issue persists.  -RESOLVED URINARY SYMPTOMS: Your recent urinary symptoms have improved after increasing your water intake. There was no infection found in your urine sample. Continue to drink plenty of water to maintain this improvement.  -GENERAL HEALTH MAINTENANCE: You are overdue for your tetanus and shingles vaccines. The tetanus shot is important for preventing bacterial infections from cuts or wounds, and the shingles vaccine can prevent future recurrences and reduce the severity of shingles pain. Please get both vaccines at the pharmacy.  INSTRUCTIONS:  Please attend your Medicare wellness visit via telephone, your oncology follow-up on January 28th, and your annual visit with Dr. Sedalia Muta on May 21st.

## 2023-03-07 NOTE — Telephone Encounter (Signed)
Rx sent 10/22/22 with 10 refills.

## 2023-03-07 NOTE — Assessment & Plan Note (Signed)
Presented with sore throat and suspected oral thrush on January 9th. Nystatin swish and swallow was prescribed. Symptoms of sore throat have resolved. Examination today shows no signs of thrush.  Tongue looks coated, explained how to differentiate between normal coated tongue and thrush. Advised to monitor for symptoms like dysphagia or retrosternal burning, which could indicate esophageal involvement. Discussed that the only confirmative way to diagnose esophageal thrush is through an upper GI endoscopy, but it is not currently necessary. - Discontinue nystatin swish and swallow - Monitor for symptoms of esophageal involvement - Consider upper GI endoscopy if symptoms persist

## 2023-03-07 NOTE — Telephone Encounter (Signed)
Pt has enough medication at her pharmacy [facility].

## 2023-03-14 NOTE — Progress Notes (Signed)
Remote ICD transmission.

## 2023-03-14 NOTE — Addendum Note (Signed)
Addended by: Geralyn Flash D on: 03/14/2023 01:18 PM   Modules accepted: Orders

## 2023-03-18 ENCOUNTER — Inpatient Hospital Stay: Payer: PPO

## 2023-03-18 ENCOUNTER — Inpatient Hospital Stay: Payer: PPO | Admitting: Oncology

## 2023-03-19 IMAGING — DX DG CHEST 2V
2 series · 2 of 2 positions shown · non-contrast
Comparison: 12/07/2020

CLINICAL DATA: CHF

EXAM:
CHEST - 2 VIEW

[chest pa]
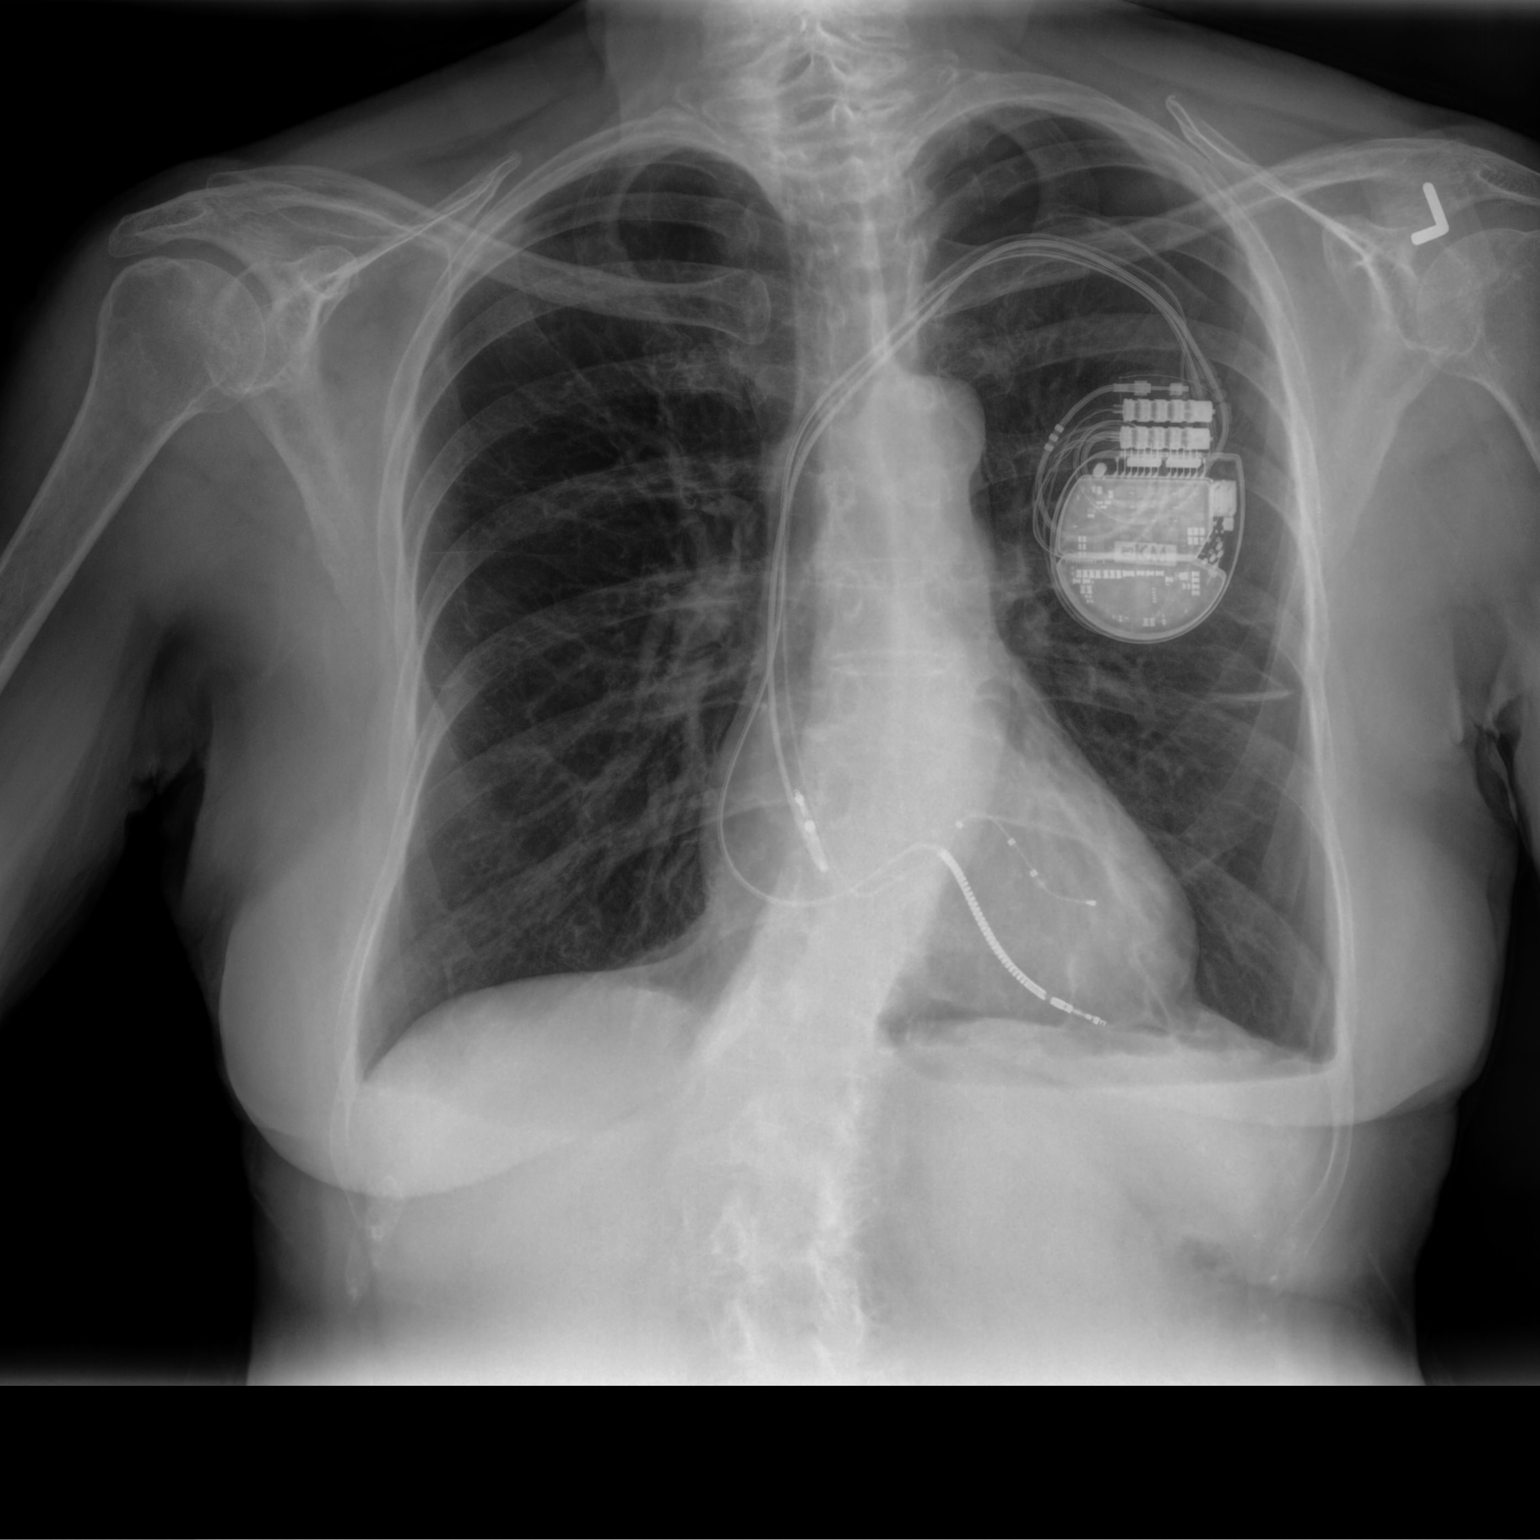

[chest lat]
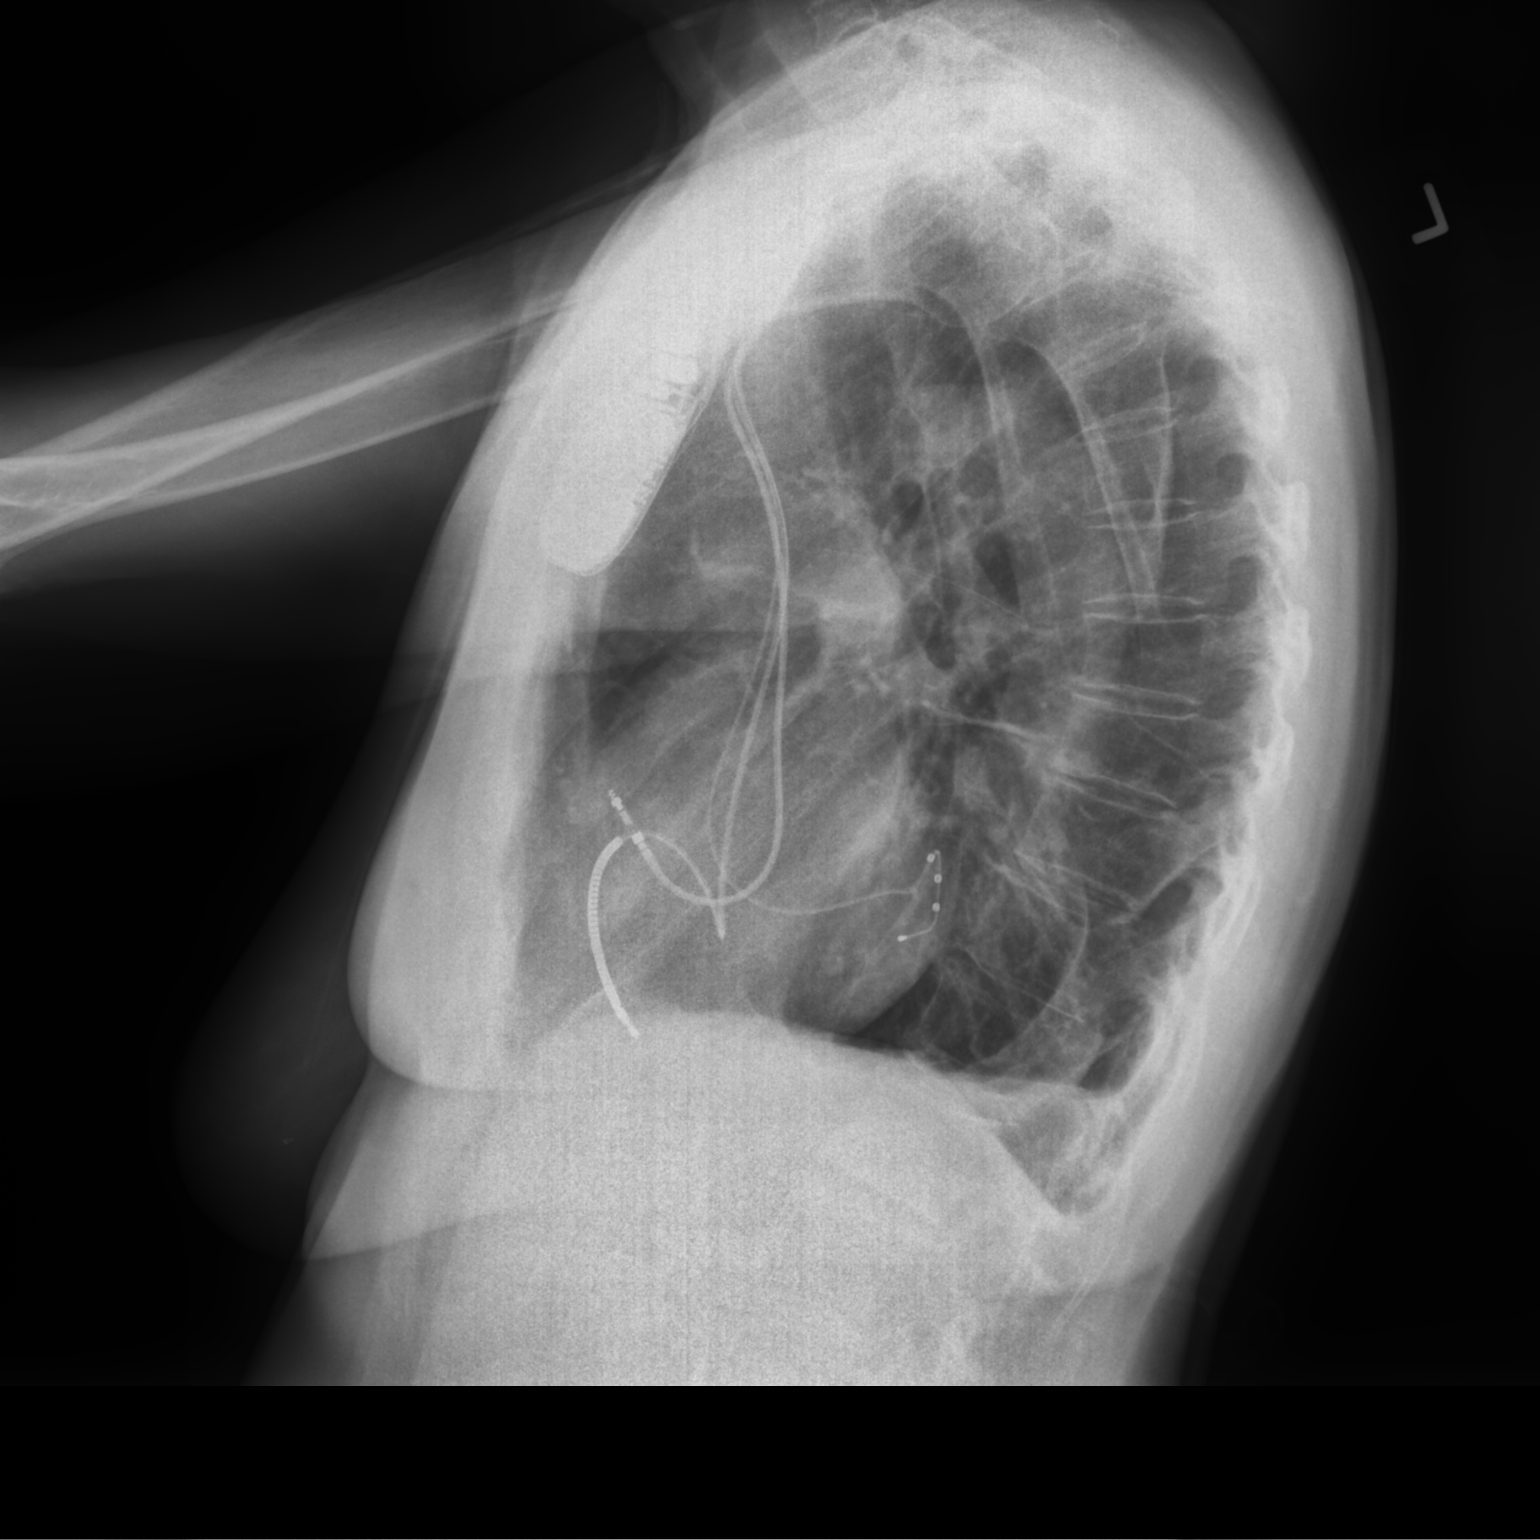

[2 of 2 positions shown; findings below may reference images not displayed]

FINDINGS: Left AICD remains in place, unchanged. Left hydropneumothorax again
noted. This is centrally unchanged since prior study with
approximately 10% pneumothorax component. Right lung clear. Minimal
left base atelectasis. Heart is normal size.
IMPRESSION: Left hydropneumothorax again noted, not significantly changed.

Left base atelectasis.

## 2023-03-24 IMAGING — DX DG CHEST 2V
2 series · 2 of 2 positions shown · non-contrast
Comparison: February 08, 2021

CLINICAL DATA: Follow-up pneumothorax.

EXAM:
CHEST - 2 VIEW

[chest pa]
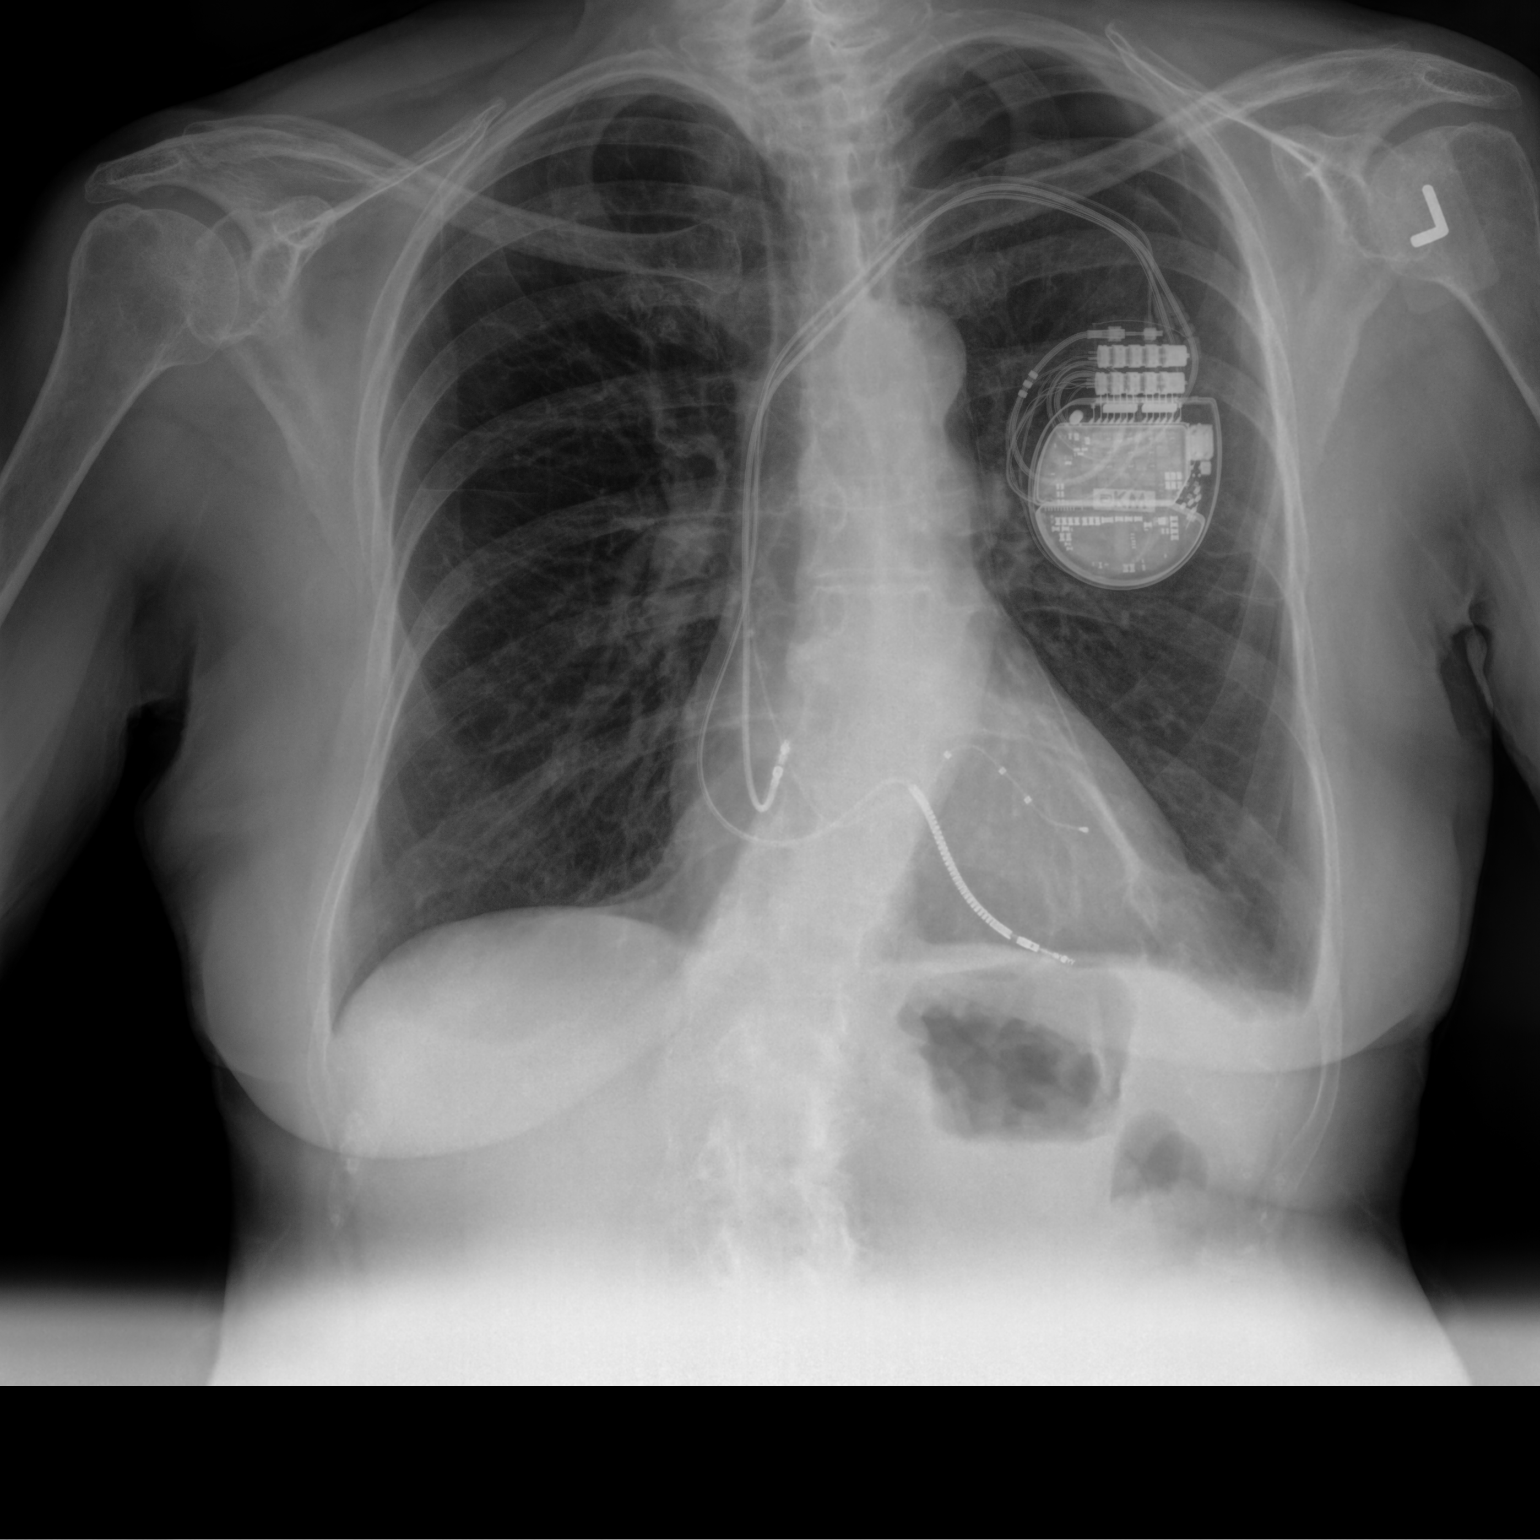

[chest lat]
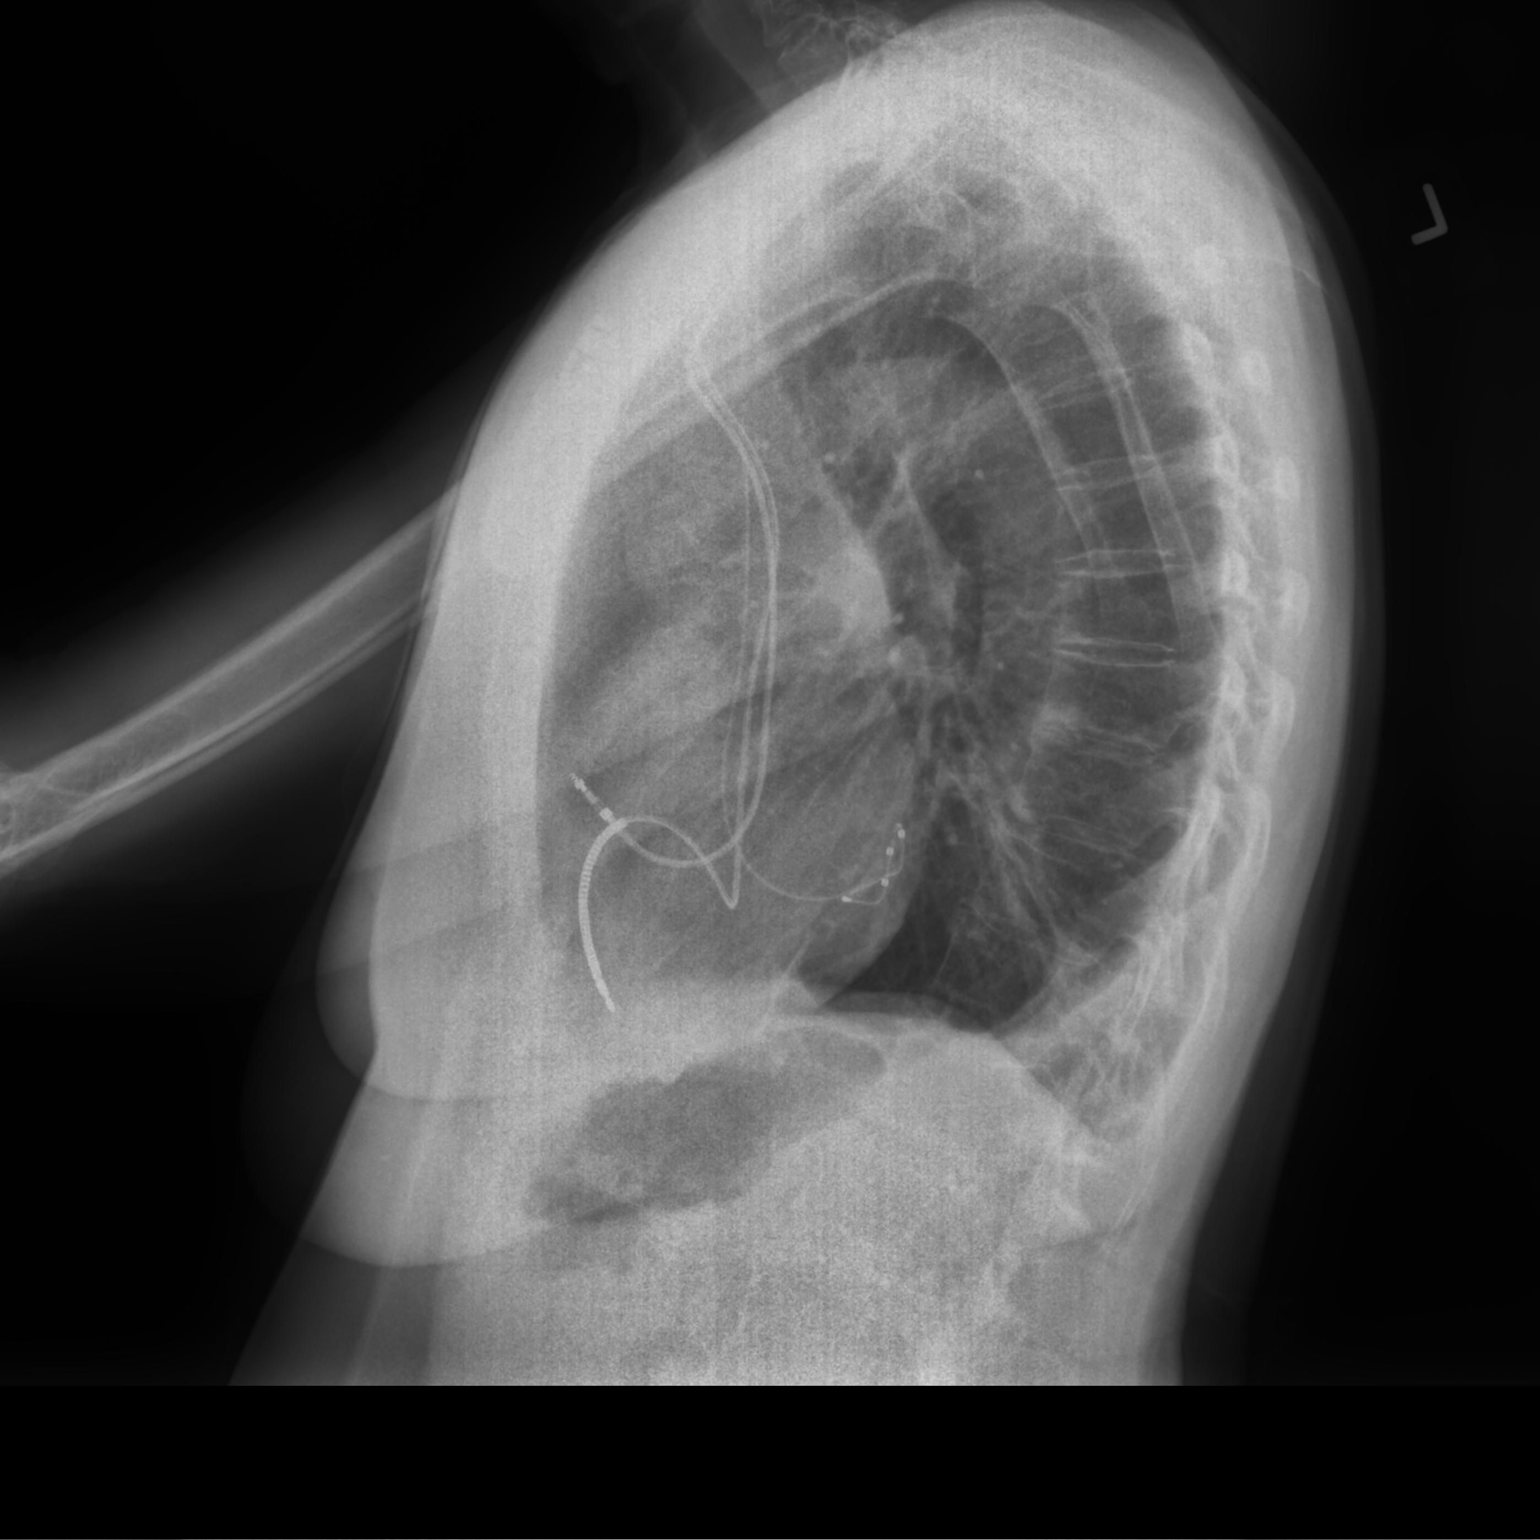

[2 of 2 positions shown; findings below may reference images not displayed]

FINDINGS: A multi lead AICD is noted with stable lead wire positioning. A
stable left-sided hydropneumothorax is seen. Mild, stable left
basilar linear atelectasis is noted. The heart size and mediastinal
contours are within normal limits. Stable moderate to marked
severity dextroscoliosis of the lower thoracic spine is present.
IMPRESSION: 1. Stable left-sided hydropneumothorax.
2. Mild, stable left basilar linear atelectasis.

## 2023-03-28 ENCOUNTER — Inpatient Hospital Stay: Payer: PPO | Admitting: Oncology

## 2023-03-28 ENCOUNTER — Inpatient Hospital Stay: Payer: PPO

## 2023-03-28 NOTE — Progress Notes (Deleted)
 [facility]  [address] [place],  [place]  [address] [phone]  Clinic Day:  03/28/2023  Referring physician: Sherre Clapper, MD   HISTORY OF PRESENT ILLNESS:  The patient is a 73 y.o. woman who I follow for mild anemia.  She comes in today to see what her hemoglobin is currently.  At her last visit, her hemoglobin was decent at 11.6.  Extensive labs done in the past did not elucidate any obvious reason behind her anemia.  Of note, her TSH level did come back mildly elevated recently, for which she is now on levothyroxine  for hypothyroidism.  She denies having increased fatigue or any overt forms of blood loss which concern for progressive anemia.  Of note, this patient was also diagnosed with stage IIA (T3 N0 M0) hormone positive breast cancer, status post a lumpectomy in November 2010. This was followed by adjuvant chemotherapy and radiation.  She also took anastrozole for 6+ years for her adjuvant endocrine management.  Mammograms since then have shown no evidence of disease recurrence.  PHYSICAL EXAM:  There were no vitals taken for this visit. Wt Readings from Last 3 Encounters:  03/07/23 138 lb 12.8 oz (63 kg)  02/27/23 140 lb (63.5 kg)  01/09/23 138 lb (62.6 kg)   There is no height or weight on file to calculate BMI. Performance status (ECOG): 0 - Asymptomatic  Physical Exam Constitutional:      Appearance: Normal appearance. She is not ill-appearing.  HENT:     Mouth/Throat:     Mouth: Mucous membranes are moist.     Pharynx: Oropharynx is clear. No oropharyngeal exudate or posterior oropharyngeal erythema.  Cardiovascular:     Rate and Rhythm: Normal rate and regular rhythm.     Heart sounds: No murmur heard.    No friction rub. No gallop.  Pulmonary:     Effort: Pulmonary effort is normal. No respiratory distress.     Breath sounds: Normal breath sounds. No wheezing, rhonchi or rales.  Chest:  Breasts:    Right: No swelling,  bleeding, inverted nipple, mass, nipple discharge or skin change.     Left: No swelling, bleeding, inverted nipple, mass, nipple discharge or skin change.  Abdominal:     General: Bowel sounds are normal. There is no distension.     Palpations: Abdomen is soft. There is no mass.     Tenderness: There is no abdominal tenderness.  Musculoskeletal:        General: No swelling or tenderness.     Cervical back: Normal range of motion and neck supple.     Right lower leg: No edema.     Left lower leg: No edema.  Lymphadenopathy:     Cervical: No cervical adenopathy.     Right cervical: No superficial, deep or posterior cervical adenopathy.    Left cervical: No superficial, deep or posterior cervical adenopathy.     Upper Body:     Right upper body: No supraclavicular or axillary adenopathy.     Left upper body: No supraclavicular or axillary adenopathy.     Lower Body: No right inguinal adenopathy. No left inguinal adenopathy.  Skin:    General: Skin is warm.     Coloration: Skin is not jaundiced.     Findings: No lesion or rash.  Neurological:     General: No focal deficit present.     Mental Status: She is alert and oriented to person, place, and time. Mental status is  at baseline.  Psychiatric:        Mood and Affect: Mood normal.        Behavior: Behavior normal.        Thought Content: Thought content normal.        Judgment: Judgment normal.   LABS:   ASSESSMENT & PLAN:  Assessment/Plan:  A 73 y.o. woman who I follow for mild anemia.  I am pleased that her hemoglobin of 12.4 is even better than what it was 4 months ago.  As mentioned previously, no previous studies showed an obvious etiology behind her anemia.  As it is near normal she is clinically  doing well, her mild anemia will continue to be followed conservatively.  I will see her back in 6 months to reassess her mild anemia and perform her next clinical breast exam.  The patient understands all the plans discussed today and is in agreement with them.  Cheris Tweten DELENA Kerns, MD

## 2023-03-31 DIAGNOSIS — K591 Functional diarrhea: Secondary | ICD-10-CM | POA: Diagnosis not present

## 2023-03-31 DIAGNOSIS — R509 Fever, unspecified: Secondary | ICD-10-CM | POA: Diagnosis not present

## 2023-03-31 DIAGNOSIS — R112 Nausea with vomiting, unspecified: Secondary | ICD-10-CM | POA: Diagnosis not present

## 2023-03-31 DIAGNOSIS — R1084 Generalized abdominal pain: Secondary | ICD-10-CM | POA: Diagnosis not present

## 2023-04-01 ENCOUNTER — Telehealth: Payer: Self-pay | Admitting: Family Medicine

## 2023-04-01 ENCOUNTER — Ambulatory Visit (INDEPENDENT_AMBULATORY_CARE_PROVIDER_SITE_OTHER): Payer: PPO | Admitting: Family Medicine

## 2023-04-01 ENCOUNTER — Encounter: Payer: Self-pay | Admitting: Family Medicine

## 2023-04-01 VITALS — BP 128/78 | HR 110 | Temp 96.8°F | Ht 66.5 in | Wt 133.8 lb

## 2023-04-01 DIAGNOSIS — R143 Flatulence: Secondary | ICD-10-CM | POA: Insufficient documentation

## 2023-04-01 DIAGNOSIS — R1031 Right lower quadrant pain: Secondary | ICD-10-CM | POA: Diagnosis not present

## 2023-04-01 DIAGNOSIS — B370 Candidal stomatitis: Secondary | ICD-10-CM | POA: Diagnosis not present

## 2023-04-01 DIAGNOSIS — R197 Diarrhea, unspecified: Secondary | ICD-10-CM | POA: Diagnosis not present

## 2023-04-01 DIAGNOSIS — R1114 Bilious vomiting: Secondary | ICD-10-CM | POA: Diagnosis not present

## 2023-04-01 MED ORDER — NYSTATIN 100000 UNIT/ML MT SUSP: 5.0000 mL | Freq: Two times a day (BID) | OROMUCOSAL | 0 refills | Status: DC | PRN

## 2023-04-01 MED ORDER — SIMETHICONE 250 MG PO CAPS: 250.0000 mg | ORAL_CAPSULE | Freq: Two times a day (BID) | ORAL | 0 refills | Status: DC

## 2023-04-01 NOTE — Assessment & Plan Note (Signed)
-  continue taking zofran as prescribed by urgent care.

## 2023-04-01 NOTE — Telephone Encounter (Unsigned)
Copied from CRM [id number]. Topic: Clinical - Prescription Issue >> Apr 01, 2023  3:56 PM Carlatta H wrote: Reason for CRM: Prescription sent to [facility] for the patient was a compound//They do not make compound medication//Prescription would have to be sent to another pharmacy

## 2023-04-01 NOTE — Assessment & Plan Note (Signed)
-  simethicone ordered for patient to take for gas

## 2023-04-01 NOTE — Progress Notes (Signed)
Acute Office Visit  Subjective:    Patient ID: Audrey Ruiz, female    DOB: 1951/02/16, 73 y.o.   MRN: [id number]  Chief Complaint  Patient presents with   Vomiting/Diarrhea    Discussed the use of AI scribe software for clinical note transcription with the patient, who gave verbal consent to proceed.   HPI: Audrey Ruiz is a 73 year old female with hypertension and GERD who presents with nausea, vomiting, and abdominal pain.  She has been experiencing nausea and vomiting since Sunday, two days prior to the visit, accompanied by hot flashes and a clammy feeling, particularly after vomiting. She visited urgent care on Monday and was prescribed Zofran and advised to take simethicone. Due to her inability to keep anything down, she has not taken her regular medications, including lisinopril, Synthroid, Entresto, omeprazole, and a statin, for three days.  Her abdominal pain is acute and non-localized, primarily in the lower abdominal area, and is accompanied by gas. She has not yet taken simethicone for relief. She experiences diarrhea two to four times a day, which she notes is subsiding. No urinary symptoms such as frequency, urgency, or dysuria.  She has a persistent runny nose and postnasal drip but no ear pain, sore throat, sinus pain, or headaches.  She mentions a history of thrush, previously treated with nystatin swish and swallow. Currently, she has no sore throat or pain but notes a coated tongue, which she has been brushing when brushing her teeth.  She recalls consuming soup delivered after her mother's passing last week, which her husband also ate without issue. She suspects this might be related to her symptoms but is unsure.  Past Medical History:  Diagnosis Date   Abnormal nuclear stress test 08/08/2020   Abnormal nuclear stress test 08/08/2020   Anxiety    Closed fracture of distal end of left radius 08/16/2020   Depressed left ventricular  ejection fraction    Depression    Diabetes mellitus without complication (HCC)    Dilated cardiomyopathy (HCC) 08/08/2020   Encounter for Medicare annual wellness exam 10/19/2019   Gastroesophageal reflux disease 10/19/2019   GERD (gastroesophageal reflux disease)    Hydropneumothorax 02/08/2021   Hyperlipidemia    Hyperlipidemia associated with type 2 diabetes mellitus (HCC) 10/19/2019   LBBB (left bundle branch block) 08/17/2020   Mitral valve prolapse    Nonischemic cardiomyopathy (HCC) 08/17/2020   Osteopenia 10/19/2019   Sinusitis 08/16/2019   Upper respiratory tract infection due to COVID-19 virus 03/18/2021    Past Surgical History:  Procedure Laterality Date   BIV ICD INSERTION CRT-D N/A 02/05/2021   Procedure: BIV ICD INSERTION CRT-D;  Surgeon: Regan Lemming, MD;  Location: [facility] INVASIVE CV LAB;  Service: Cardiovascular;  Laterality: N/A;   BREAST LUMPECTOMY Left 2010   CATARACT EXTRACTION BILATERAL W/ ANTERIOR VITRECTOMY  2021   EYE SURGERY Bilateral    Cataract Removal   LEFT HEART CATH AND CORONARY ANGIOGRAPHY N/A 08/08/2020   Procedure: LEFT HEART CATH AND CORONARY ANGIOGRAPHY;  Surgeon: Marykay Lex, MD;  Location: [facility] INVASIVE CV LAB;  Service: Cardiovascular;  Laterality: N/A;    Family History  Problem Relation Age of Onset   Osteoporosis Mother    Heart disease Father    Hyperlipidemia Father    Coronary artery disease Father    Diabetes Sister    Breast cancer Daughter    Diabetes Maternal Grandfather     Social History   Socioeconomic History  Marital status: Married    Spouse name: Viviann Spare   Number of children: 2   Years of education: Not on file   Highest education level: Master's degree (e.g., MA, MS, MEng, MEd, MSW, MBA)  Occupational History   Occupation: Retired  Tobacco Use   Smoking status: Never   Smokeless tobacco: Never  Vaping Use   Vaping status: Never Used  Substance and Sexual Activity   Alcohol use: Yes     Alcohol/week: 2.0 standard drinks of alcohol    Types: 2 Glasses of wine per week    Comment: occasional (white wine)   Drug use: Never   Sexual activity: Not Currently    Partners: Male  Other Topics Concern   Not on file  Social History Narrative   Son lives in [place], Daughter lives in [place]   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   Social Drivers of Health   Financial Resource Strain: Low Risk  (11/04/2022)   Overall Financial Resource Strain (CARDIA)    Difficulty of Paying Living Expenses: Not very hard  Food Insecurity: No Food Insecurity (11/04/2022)   Hunger Vital Sign    Worried About Running Out of Food in the Last Year: Never true    Ran Out of Food in the Last Year: Never true  Transportation Needs: No Transportation Needs (11/04/2022)   PRAPARE - Administrator, Civil Service (Medical): No    Lack of Transportation (Non-Medical): No  Physical Activity: Sufficiently Active (11/04/2022)   Exercise Vital Sign    Days of Exercise per Week: 6 days    Minutes of Exercise per Session: 30 min  Stress: No Stress Concern Present (11/04/2022)   [employer] of Occupational Health - Occupational Stress Questionnaire    Feeling of Stress : Not at all  Social Connections: Socially Integrated (11/04/2022)   Social Connection and Isolation Panel [NHANES]    Frequency of Communication with Friends and Family: More than three times a week    Frequency of Social Gatherings with Friends and Family: More than three times a week    Attends Religious Services: More than 4 times per year    Active Member of [employer] or Organizations: Yes    Attends Engineer, structural: More than 4 times per year    Marital Status: Married  Catering manager Violence: Not At Risk (11/04/2022)   Humiliation, Afraid, Rape, and Kick questionnaire    Fear of Current or Ex-Partner: No    Emotionally Abused: No    Physically Abused: No    Sexually Abused: No     Outpatient Medications Prior to Visit  Medication Sig Dispense Refill   cetirizine (ALLERGY RELIEF CETIRIZINE) 10 MG tablet Take 1 tablet (10 mg total) by mouth at bedtime. 90 tablet 3   ezetimibe (ZETIA) 10 MG tablet Take 1 tablet (10 mg total) by mouth at bedtime. 90 tablet 3   FLUoxetine (PROZAC) 40 MG capsule Take 1 capsule (40 mg total) by mouth daily. 90 capsule 3   fluticasone (FLONASE) 50 MCG/ACT nasal spray USE 2 SPRAYS INTO EACH NOSTRIL ONCE DAILY *NEW PRESCRIPTION REQUEST* 48 g 3   Glucosamine-Chondroitin-MSM 1500-1200-500 MG PACK Take 1 tablet by mouth daily.     ibuprofen (ADVIL) 200 MG tablet Take 200 mg by mouth every 8 (eight) hours as needed for moderate pain.     levothyroxine (SYNTHROID) 25 MCG tablet Take 1 tablet (25 mcg total) by mouth daily before breakfast. 90 tablet  3   Melatonin 10 MG CAPS Take 5 mg by mouth at bedtime.     Multiple Vitamin (MULTIVITAMIN) capsule Take 1 capsule by mouth daily.     Omega-3 Fatty Acids (FISH OIL) 1000 MG CAPS Take 1,000 mg by mouth 2 (two) times daily.     omeprazole (PRILOSEC) 20 MG capsule Take 1 capsule (20 mg total) by mouth daily. 90 capsule 3   ondansetron (ZOFRAN-ODT) 4 MG disintegrating tablet Take 4 mg by mouth every 8 (eight) hours as needed for nausea or vomiting.     rosuvastatin (CRESTOR) 40 MG tablet TAKE 1 TABLET BY MOUTH EVERY DAY AT BEDTIME 30 tablet 10   sacubitril-valsartan (ENTRESTO) 24-26 MG TAKE 1 TABLET BY MOUTH IN THE MORNING AND 1 AT BEDTIME *NEW PRESCRIPTION REQUEST* 60 tablet 11   Vitamin D, Cholecalciferol, 25 MCG (1000 UT) CAPS Take 1,000 Units by mouth daily.     No facility-administered medications prior to visit.    No Known Allergies  Review of Systems  Constitutional:  Negative for appetite change, fatigue and fever.       Clammy  HENT:  Positive for postnasal drip and rhinorrhea. Negative for congestion, ear pain, sinus pressure, sinus pain, sore throat and trouble swallowing.   Respiratory:   Negative for cough, chest tightness, shortness of breath and wheezing.   Cardiovascular:  Negative for chest pain and palpitations.  Gastrointestinal:  Positive for diarrhea, nausea and vomiting. Negative for abdominal pain and constipation.  Genitourinary:  Negative for dysuria, hematuria and urgency.  Musculoskeletal:  Negative for arthralgias, back pain, joint swelling and myalgias.  Skin:  Negative for rash.  Neurological:  Negative for dizziness, weakness and headaches.  Psychiatric/Behavioral:  Negative for dysphoric mood and sleep disturbance. The patient is not nervous/anxious.        Objective:        04/01/2023    2:52 PM 03/07/2023    9:13 AM 02/27/2023    2:10 PM  Vitals with BMI  Height 5' 6.5" 5' 6.5" 5' 6.5"  Weight 133 lbs 13 oz 138 lbs 13 oz 140 lbs  BMI 21.27 22.07 22.26  Systolic 128 100 161  Diastolic 78 76 70  Pulse 110 80 75    Orthostatic VS for the past 72 hrs (Last 3 readings):  Patient Position BP Location  04/01/23 1452 Sitting Left Arm     Physical Exam Constitutional:      General: She is not in acute distress.    Appearance: Normal appearance. She is ill-appearing.  HENT:     Mouth/Throat:     Dentition: Dental caries present.     Pharynx: Posterior oropharyngeal erythema present.     Comments: White patches on tongue Neck:     Vascular: No carotid bruit.  Cardiovascular:     Rate and Rhythm: Normal rate and regular rhythm.     Heart sounds: Normal heart sounds. No murmur heard. Pulmonary:     Effort: Pulmonary effort is normal.     Breath sounds: Normal breath sounds. No wheezing.  Abdominal:     General: Bowel sounds are increased.     Palpations: Abdomen is soft.     Tenderness: There is abdominal tenderness in the right lower quadrant, periumbilical area and left lower quadrant. There is no rebound. Positive signs include McBurney's sign. Negative signs include Rovsing's sign and obturator sign.  Musculoskeletal:        General:  Normal range of motion.  Skin:    General:  Skin is warm.     Capillary Refill: Capillary refill takes less than 2 seconds.  Neurological:     Mental Status: She is alert. Mental status is at baseline.  Psychiatric:        Mood and Affect: Mood normal.        Behavior: Behavior normal.     Health Maintenance Due  Topic Date Due   FOOT EXAM  Never done   OPHTHALMOLOGY EXAM  Never done   Diabetic kidney evaluation - Urine ACR  Never done   Zoster Vaccines- Shingrix (1 of 2) Never done   DTaP/Tdap/Td (2 - Td or Tdap) 12/01/2022   COVID-19 Vaccine (6 - 2024-25 season) 03/06/2023    There are no preventive care reminders to display for this patient.   Lab Results  Component Value Date   TSH 1.639 09/13/2022   Lab Results  Component Value Date   WBC 4.3 12/30/2022   HGB 12.4 12/30/2022   HCT 39.2 12/30/2022   MCV 99 (H) 12/30/2022   PLT 254 12/30/2022   Lab Results  Component Value Date   NA 142 12/30/2022   K 4.9 12/30/2022   CO2 22 12/30/2022   GLUCOSE 85 12/30/2022   BUN 12 12/30/2022   CREATININE 0.70 12/30/2022   BILITOT 0.9 12/30/2022   ALKPHOS 62 12/30/2022   AST 33 12/30/2022   ALT 25 12/30/2022   PROT 6.4 12/30/2022   ALBUMIN 4.4 12/30/2022   CALCIUM 10.5 (H) 12/30/2022   ANIONGAP 8 04/02/2022   EGFR 91 12/30/2022   Lab Results  Component Value Date   CHOL 155 12/30/2022   Lab Results  Component Value Date   HDL 86 12/30/2022   Lab Results  Component Value Date   LDLCALC 56 12/30/2022   Lab Results  Component Value Date   TRIG 67 12/30/2022   Lab Results  Component Value Date   CHOLHDL 1.8 12/30/2022   Lab Results  Component Value Date   HGBA1C 5.6 12/30/2022       Assessment & Plan:  RLQ abdominal pain Assessment & Plan: Acute Symptoms of nausea, vomiting, and diarrhea started two days ago. Possible etiologies include food poisoning or viral gastroenteritis. Lower abdominal pain on physical examination. -Order CT abdomen without  contrast to rule out appendicitis. -Continue Zofran for nausea and vomiting. -Start Simethicone for gas. -Consider stool studies if diarrhea persists.   Orders: -     CT ABDOMEN PELVIS WO CONTRAST; Future  Bilious vomiting with nausea Assessment & Plan: -continue taking zofran as prescribed by urgent care.  Orders: -     CT ABDOMEN PELVIS WO CONTRAST; Future  Oral thrush Assessment & Plan: White patches on tongue, previously treated with Nystatin swish and swallow. No current symptoms of dysphagia or retrosternal burning. -Start Magic Mouthwash, swish and spit.  Orders: -     magic mouthwash (nystatin, lidocaine, diphenhydrAMINE, alum & mag hydroxide) suspension; Swish and spit 5 mLs 2 (two) times daily as needed for mouth pain.  Dispense: 180 mL; Refill: 0  Diarrhea, unspecified type Assessment & Plan: -order stool studies to rule out any treatable infective agents.  Orders: -     Cdiff NAA+O+P+Stool Culture  Flatulence symptom Assessment & Plan: -simethicone ordered for patient to take for gas  Orders: -     Simethicone; Take 250 mg by mouth in the morning and at bedtime.  Dispense: 60 capsule; Refill: 0     Meds ordered this encounter  Medications   magic  mouthwash (nystatin, lidocaine, diphenhydrAMINE, alum & mag hydroxide) suspension    Sig: Swish and spit 5 mLs 2 (two) times daily as needed for mouth pain.    Dispense:  180 mL    Refill:  0   Simethicone 250 MG CAPS    Sig: Take 250 mg by mouth in the morning and at bedtime.    Dispense:  60 capsule    Refill:  0    Orders Placed This Encounter  Procedures   Cdiff NAA+O+P+Stool Culture   CT ABDOMEN PELVIS WO CONTRAST     Follow-up: Return if symptoms worsen or fail to improve.  An After Visit Summary was printed and given to the patient.  Total time spent on today's visit was 38 minutes, including both face-to-face time and nonface-to-face time personally spent on review of chart (labs and imaging),  discussing labs and goals, discussing further work-up, treatment options, referrals to specialist if needed, reviewing outside records if pertinent, answering patient's questions, and coordinating care.    Lajuana Matte, FNP [facility] [phone]

## 2023-04-01 NOTE — Assessment & Plan Note (Signed)
White patches on tongue, previously treated with Nystatin swish and swallow. No current symptoms of dysphagia or retrosternal burning. -Start Magic Mouthwash, swish and spit.

## 2023-04-01 NOTE — Assessment & Plan Note (Signed)
-  order stool studies to rule out any treatable infective agents.

## 2023-04-01 NOTE — Assessment & Plan Note (Signed)
Acute Symptoms of nausea, vomiting, and diarrhea started two days ago. Possible etiologies include food poisoning or viral gastroenteritis. Lower abdominal pain on physical examination. -Order CT abdomen without contrast to rule out appendicitis. -Continue Zofran for nausea and vomiting. -Start Simethicone for gas. -Consider stool studies if diarrhea persists.

## 2023-04-02 ENCOUNTER — Ambulatory Visit (HOSPITAL_BASED_OUTPATIENT_CLINIC_OR_DEPARTMENT_OTHER)
Admission: RE | Admit: 2023-04-02 | Discharge: 2023-04-02 | Disposition: A | Discharge status: Home / Self Care | Payer: PPO | Source: Ambulatory Visit | Attending: Family Medicine | Admitting: Family Medicine

## 2023-04-02 DIAGNOSIS — R1114 Bilious vomiting: Secondary | ICD-10-CM | POA: Diagnosis not present

## 2023-04-02 DIAGNOSIS — K56600 Partial intestinal obstruction, unspecified as to cause: Secondary | ICD-10-CM | POA: Diagnosis not present

## 2023-04-02 DIAGNOSIS — R1031 Right lower quadrant pain: Secondary | ICD-10-CM

## 2023-04-02 DIAGNOSIS — R188 Other ascites: Secondary | ICD-10-CM | POA: Diagnosis not present

## 2023-04-02 DIAGNOSIS — K449 Diaphragmatic hernia without obstruction or gangrene: Secondary | ICD-10-CM | POA: Diagnosis not present

## 2023-04-03 ENCOUNTER — Telehealth: Payer: Self-pay | Admitting: Family Medicine

## 2023-04-03 ENCOUNTER — Other Ambulatory Visit: Payer: Self-pay | Admitting: Family Medicine

## 2023-04-03 ENCOUNTER — Emergency Department (HOSPITAL_COMMUNITY): Payer: PPO

## 2023-04-03 ENCOUNTER — Other Ambulatory Visit: Payer: Self-pay

## 2023-04-03 ENCOUNTER — Telehealth: Payer: Self-pay

## 2023-04-03 ENCOUNTER — Inpatient Hospital Stay (HOSPITAL_COMMUNITY)
Admission: EM | Admit: 2023-04-03 | Discharge: 2023-04-07 | DRG: 389 | Disposition: A | Discharge status: Home / Self Care | Payer: PPO | Attending: Internal Medicine | Admitting: Internal Medicine

## 2023-04-03 ENCOUNTER — Observation Stay (HOSPITAL_COMMUNITY): Payer: PPO

## 2023-04-03 ENCOUNTER — Encounter (HOSPITAL_COMMUNITY): Payer: Self-pay | Admitting: General Surgery

## 2023-04-03 DIAGNOSIS — R197 Diarrhea, unspecified: Secondary | ICD-10-CM | POA: Diagnosis not present

## 2023-04-03 DIAGNOSIS — Z4682 Encounter for fitting and adjustment of non-vascular catheter: Secondary | ICD-10-CM | POA: Diagnosis not present

## 2023-04-03 DIAGNOSIS — Z8262 Family history of osteoporosis: Secondary | ICD-10-CM

## 2023-04-03 DIAGNOSIS — F419 Anxiety disorder, unspecified: Secondary | ICD-10-CM | POA: Diagnosis present

## 2023-04-03 DIAGNOSIS — Z8349 Family history of other endocrine, nutritional and metabolic diseases: Secondary | ICD-10-CM

## 2023-04-03 DIAGNOSIS — K56609 Unspecified intestinal obstruction, unspecified as to partial versus complete obstruction: Principal | ICD-10-CM | POA: Diagnosis present

## 2023-04-03 DIAGNOSIS — Z9851 Tubal ligation status: Secondary | ICD-10-CM

## 2023-04-03 DIAGNOSIS — I420 Dilated cardiomyopathy: Secondary | ICD-10-CM | POA: Diagnosis present

## 2023-04-03 DIAGNOSIS — I110 Hypertensive heart disease with heart failure: Secondary | ICD-10-CM | POA: Diagnosis present

## 2023-04-03 DIAGNOSIS — K5651 Intestinal adhesions [bands], with partial obstruction: Principal | ICD-10-CM | POA: Diagnosis present

## 2023-04-03 DIAGNOSIS — M8580 Other specified disorders of bone density and structure, unspecified site: Secondary | ICD-10-CM | POA: Diagnosis present

## 2023-04-03 DIAGNOSIS — I341 Nonrheumatic mitral (valve) prolapse: Secondary | ICD-10-CM | POA: Diagnosis present

## 2023-04-03 DIAGNOSIS — K219 Gastro-esophageal reflux disease without esophagitis: Secondary | ICD-10-CM | POA: Diagnosis present

## 2023-04-03 DIAGNOSIS — K5669 Other partial intestinal obstruction: Secondary | ICD-10-CM | POA: Diagnosis not present

## 2023-04-03 DIAGNOSIS — Z79890 Hormone replacement therapy (postmenopausal): Secondary | ICD-10-CM

## 2023-04-03 DIAGNOSIS — E039 Hypothyroidism, unspecified: Secondary | ICD-10-CM | POA: Diagnosis present

## 2023-04-03 DIAGNOSIS — E785 Hyperlipidemia, unspecified: Secondary | ICD-10-CM | POA: Diagnosis present

## 2023-04-03 DIAGNOSIS — F32A Depression, unspecified: Secondary | ICD-10-CM | POA: Diagnosis present

## 2023-04-03 DIAGNOSIS — E119 Type 2 diabetes mellitus without complications: Secondary | ICD-10-CM

## 2023-04-03 DIAGNOSIS — Z95810 Presence of automatic (implantable) cardiac defibrillator: Secondary | ICD-10-CM

## 2023-04-03 DIAGNOSIS — Z853 Personal history of malignant neoplasm of breast: Secondary | ICD-10-CM

## 2023-04-03 DIAGNOSIS — E861 Hypovolemia: Secondary | ICD-10-CM | POA: Diagnosis present

## 2023-04-03 DIAGNOSIS — Z8249 Family history of ischemic heart disease and other diseases of the circulatory system: Secondary | ICD-10-CM

## 2023-04-03 DIAGNOSIS — E1169 Type 2 diabetes mellitus with other specified complication: Secondary | ICD-10-CM | POA: Diagnosis present

## 2023-04-03 DIAGNOSIS — K529 Noninfective gastroenteritis and colitis, unspecified: Secondary | ICD-10-CM | POA: Diagnosis present

## 2023-04-03 DIAGNOSIS — I878 Other specified disorders of veins: Secondary | ICD-10-CM | POA: Diagnosis not present

## 2023-04-03 DIAGNOSIS — Z79899 Other long term (current) drug therapy: Secondary | ICD-10-CM

## 2023-04-03 DIAGNOSIS — I428 Other cardiomyopathies: Secondary | ICD-10-CM

## 2023-04-03 DIAGNOSIS — Z803 Family history of malignant neoplasm of breast: Secondary | ICD-10-CM

## 2023-04-03 DIAGNOSIS — I5022 Chronic systolic (congestive) heart failure: Secondary | ICD-10-CM | POA: Diagnosis not present

## 2023-04-03 DIAGNOSIS — I10 Essential (primary) hypertension: Secondary | ICD-10-CM | POA: Diagnosis not present

## 2023-04-03 DIAGNOSIS — E782 Mixed hyperlipidemia: Secondary | ICD-10-CM | POA: Diagnosis not present

## 2023-04-03 DIAGNOSIS — N179 Acute kidney failure, unspecified: Secondary | ICD-10-CM | POA: Diagnosis present

## 2023-04-03 DIAGNOSIS — R109 Unspecified abdominal pain: Secondary | ICD-10-CM | POA: Diagnosis not present

## 2023-04-03 DIAGNOSIS — Z8616 Personal history of COVID-19: Secondary | ICD-10-CM

## 2023-04-03 DIAGNOSIS — Z833 Family history of diabetes mellitus: Secondary | ICD-10-CM

## 2023-04-03 DIAGNOSIS — K2100 Gastro-esophageal reflux disease with esophagitis, without bleeding: Secondary | ICD-10-CM | POA: Diagnosis not present

## 2023-04-03 DIAGNOSIS — E871 Hypo-osmolality and hyponatremia: Secondary | ICD-10-CM | POA: Diagnosis present

## 2023-04-03 DIAGNOSIS — R112 Nausea with vomiting, unspecified: Secondary | ICD-10-CM | POA: Diagnosis not present

## 2023-04-03 DIAGNOSIS — R110 Nausea: Secondary | ICD-10-CM | POA: Diagnosis not present

## 2023-04-03 LAB — CBC WITH DIFFERENTIAL/PLATELET
Abs Immature Granulocytes: 0.01 10*3/uL (ref 0.00–0.07)
Basophils Absolute: 0.1 10*3/uL (ref 0.0–0.1)
Basophils Relative: 1 %
Eosinophils Absolute: 0.1 10*3/uL (ref 0.0–0.5)
Eosinophils Relative: 2 %
HCT: 41 % (ref 36.0–46.0)
Hemoglobin: 13.7 g/dL (ref 12.0–15.0)
Immature Granulocytes: 0 %
Lymphocytes Relative: 23 %
Lymphs Abs: 1.7 10*3/uL (ref 0.7–4.0)
MCH: 30.8 pg (ref 26.0–34.0)
MCHC: 33.4 g/dL (ref 30.0–36.0)
MCV: 92.1 fL (ref 80.0–100.0)
Monocytes Absolute: 0.6 10*3/uL (ref 0.1–1.0)
Monocytes Relative: 8 %
Neutro Abs: 5.2 10*3/uL (ref 1.7–7.7)
Neutrophils Relative %: 66 %
Platelets: 271 10*3/uL (ref 150–400)
RBC: 4.45 MIL/uL (ref 3.87–5.11)
RDW: 12.3 % (ref 11.5–15.5)
WBC: 7.7 10*3/uL (ref 4.0–10.5)
nRBC: 0 % (ref 0.0–0.2)

## 2023-04-03 LAB — COMPREHENSIVE METABOLIC PANEL
ALT: 16 U/L (ref 0–44)
AST: 25 U/L (ref 15–41)
Albumin: 4 g/dL (ref 3.5–5.0)
Alkaline Phosphatase: 45 U/L (ref 38–126)
Anion gap: 13 (ref 5–15)
BUN: 38 mg/dL — ABNORMAL HIGH (ref 8–23)
CO2: 21 mmol/L — ABNORMAL LOW (ref 22–32)
Calcium: 10.3 mg/dL (ref 8.9–10.3)
Chloride: 96 mmol/L — ABNORMAL LOW (ref 98–111)
Creatinine, Ser: 1.1 mg/dL — ABNORMAL HIGH (ref 0.44–1.00)
GFR, Estimated: 53 mL/min — ABNORMAL LOW (ref >60)
Glucose, Bld: 81 mg/dL (ref 70–99)
Potassium: 3.6 mmol/L (ref 3.5–5.1)
Sodium: 130 mmol/L — ABNORMAL LOW (ref 135–145)
Total Bilirubin: 2.1 mg/dL — ABNORMAL HIGH (ref 0.0–1.2)
Total Protein: 7 g/dL (ref 6.5–8.1)

## 2023-04-03 LAB — GLUCOSE, CAPILLARY
Glucose-Capillary: 76 mg/dL (ref 70–99)
Glucose-Capillary: 70 mg/dL (ref 70–99)
Glucose-Capillary: 74 mg/dL (ref 70–99)

## 2023-04-03 LAB — LIPASE, BLOOD: Lipase: 33 U/L (ref 11–51)

## 2023-04-03 MED ORDER — ENOXAPARIN SODIUM 40 MG/0.4ML IJ SOSY
40.0000 mg | PREFILLED_SYRINGE | INTRAMUSCULAR | Status: DC
  Administered 2023-04-03 – 2023-04-05 (×3): 40 mg via SUBCUTANEOUS
  Filled 2023-04-03 (×2): qty 0.4

## 2023-04-03 MED ORDER — SODIUM CHLORIDE 0.9 % IV SOLN
INTRAVENOUS | Status: DC
Start: 2023-04-03 — End: 2023-04-04

## 2023-04-03 MED ORDER — ACETAMINOPHEN 325 MG PO TABS
650.0000 mg | ORAL_TABLET | Freq: Four times a day (QID) | ORAL | Status: DC | PRN
  Administered 2023-04-04 – 2023-04-05 (×2): 650 mg via ORAL
  Filled 2023-04-03 (×2): qty 2

## 2023-04-03 MED ORDER — ALBUTEROL SULFATE (2.5 MG/3ML) 0.083% IN NEBU: 2.5000 mg | INHALATION_SOLUTION | Freq: Four times a day (QID) | RESPIRATORY_TRACT | Status: DC | PRN

## 2023-04-03 MED ORDER — HYDROMORPHONE HCL 1 MG/ML IJ SOLN
0.5000 mg | INTRAMUSCULAR | Status: DC | PRN
  Administered 2023-04-03: 0.5 mg via INTRAVENOUS
  Filled 2023-04-03: qty 0.5

## 2023-04-03 MED ORDER — DIATRIZOATE MEGLUMINE & SODIUM 66-10 % PO SOLN
90.0000 mL | Freq: Once | ORAL | Status: AC
  Administered 2023-04-03: 90 mL via ORAL
  Filled 2023-04-03: qty 90

## 2023-04-03 MED ORDER — POLYETHYLENE GLYCOL 3350 17 G PO PACK: 17.0000 g | PACK | Freq: Every day | ORAL | Status: DC | PRN

## 2023-04-03 MED ORDER — FENTANYL CITRATE PF 50 MCG/ML IJ SOSY: 12.5000 ug | PREFILLED_SYRINGE | Freq: Once | INTRAMUSCULAR | Status: DC

## 2023-04-03 MED ORDER — PANTOPRAZOLE SODIUM 40 MG IV SOLR
40.0000 mg | Freq: Every day | INTRAVENOUS | Status: DC
  Administered 2023-04-04 – 2023-04-07 (×4): 40 mg via INTRAVENOUS
  Filled 2023-04-03 (×4): qty 10

## 2023-04-03 MED ORDER — PROCHLORPERAZINE EDISYLATE 10 MG/2ML IJ SOLN
5.0000 mg | Freq: Once | INTRAMUSCULAR | Status: AC
Start: 2023-04-03 — End: 2023-04-03
  Administered 2023-04-03: 5 mg via INTRAVENOUS
  Filled 2023-04-03: qty 2

## 2023-04-03 MED ORDER — ONDANSETRON HCL 4 MG/2ML IJ SOLN
4.0000 mg | Freq: Once | INTRAMUSCULAR | Status: AC
  Administered 2023-04-03: 4 mg via INTRAVENOUS
  Filled 2023-04-03: qty 2

## 2023-04-03 MED ORDER — LEVOTHYROXINE SODIUM 50 MCG PO TABS: 25.0000 ug | ORAL_TABLET | Freq: Every day | ORAL | Status: DC

## 2023-04-03 MED ORDER — INSULIN ASPART 100 UNIT/ML IJ SOLN: 0.0000 [IU] | INTRAMUSCULAR | Status: DC

## 2023-04-03 MED ORDER — ONDANSETRON HCL 4 MG/2ML IJ SOLN
4.0000 mg | Freq: Four times a day (QID) | INTRAMUSCULAR | Status: DC | PRN
  Administered 2023-04-03: 4 mg via INTRAVENOUS
  Filled 2023-04-03: qty 2

## 2023-04-03 MED ORDER — BISACODYL 5 MG PO TBEC: 5.0000 mg | DELAYED_RELEASE_TABLET | Freq: Every day | ORAL | Status: DC | PRN

## 2023-04-03 MED ORDER — SODIUM CHLORIDE 0.9 % IV BOLUS
500.0000 mL | Freq: Once | INTRAVENOUS | Status: AC
  Administered 2023-04-03: 500 mL via INTRAVENOUS

## 2023-04-03 MED ORDER — HYDRALAZINE HCL 20 MG/ML IJ SOLN: 10.0000 mg | Freq: Four times a day (QID) | INTRAMUSCULAR | Status: DC | PRN

## 2023-04-03 MED ORDER — ACETAMINOPHEN 650 MG RE SUPP: 650.0000 mg | Freq: Four times a day (QID) | RECTAL | Status: DC | PRN

## 2023-04-03 NOTE — ED Provider Notes (Addendum)
[place] [facility] AT [facility] Provider Note   CSN: [id number] Arrival date & time: 04/03/23  1333     History  Chief Complaint  Patient presents with   Nausea   Emesis   Diarrhea    Tymeshia Ruiz Audrey Ruiz is a 73 y.o. female.   Emesis Associated symptoms: diarrhea   Diarrhea Associated symptoms: vomiting   Patient with nausea vomiting and diarrhea.  Has had for the last few days.  Seen by PCP.  Had ordered some stool studies does not appear to have been done, however got outpatient CT scan that showed obstruction.  Results came back today.  Patient still having some vomiting.  Has had previous tubal ligation.  Continues to vomit.  Decreased oral intake because she is worried she will throw up.  Mild abdominal cramping.    Past Surgical History:  Procedure Laterality Date   BIV ICD INSERTION CRT-D N/A 02/05/2021   Procedure: BIV ICD INSERTION CRT-D;  Surgeon: Regan Lemming, MD;  Location: [facility] INVASIVE CV LAB;  Service: Cardiovascular;  Laterality: N/A;   BREAST LUMPECTOMY Left 2010   CATARACT EXTRACTION BILATERAL W/ ANTERIOR VITRECTOMY  2021   EYE SURGERY Bilateral    Cataract Removal   LEFT HEART CATH AND CORONARY ANGIOGRAPHY N/A 08/08/2020   Procedure: LEFT HEART CATH AND CORONARY ANGIOGRAPHY;  Surgeon: Marykay Lex, MD;  Location: [facility] INVASIVE CV LAB;  Service: Cardiovascular;  Laterality: N/A;   Past Medical History:  Diagnosis Date   Abnormal nuclear stress test 08/08/2020   Abnormal nuclear stress test 08/08/2020   Anxiety    Closed fracture of distal end of left radius 08/16/2020   Depressed left ventricular ejection fraction    Depression    Diabetes mellitus without complication (HCC)    Dilated cardiomyopathy (HCC) 08/08/2020   Encounter for Medicare annual wellness exam 10/19/2019   Gastroesophageal reflux disease 10/19/2019   GERD (gastroesophageal reflux disease)    Hydropneumothorax 02/08/2021   Hyperlipidemia     Hyperlipidemia associated with type 2 diabetes mellitus (HCC) 10/19/2019   LBBB (left bundle branch block) 08/17/2020   Mitral valve prolapse    Nonischemic cardiomyopathy (HCC) 08/17/2020   Osteopenia 10/19/2019   Sinusitis 08/16/2019   Upper respiratory tract infection due to COVID-19 virus 03/18/2021     Home Medications Prior to Admission medications   Medication Sig Start Date End Date Taking? Authorizing Provider  cetirizine (ALLERGY RELIEF CETIRIZINE) 10 MG tablet Take 1 tablet (10 mg total) by mouth at bedtime. 03/06/23   Cox, Fritzi Mandes, MD  ezetimibe (ZETIA) 10 MG tablet Take 1 tablet (10 mg total) by mouth at bedtime. 03/06/23   Cox, Fritzi Mandes, MD  FLUoxetine (PROZAC) 40 MG capsule Take 1 capsule (40 mg total) by mouth daily. 03/06/23   Cox, Fritzi Mandes, MD  fluticasone (FLONASE) 50 MCG/ACT nasal spray USE 2 SPRAYS INTO EACH NOSTRIL ONCE DAILY *NEW PRESCRIPTION REQUEST* 03/06/23   Cox, Fritzi Mandes, MD  Glucosamine-Chondroitin-MSM 1500-1200-500 MG PACK Take 1 tablet by mouth daily.    [provider]  ibuprofen (ADVIL) 200 MG tablet Take 200 mg by mouth every 8 (eight) hours as needed for moderate pain.    [provider]  levothyroxine (SYNTHROID) 25 MCG tablet Take 1 tablet (25 mcg total) by mouth daily before breakfast. 03/06/23   Cox, Fritzi Mandes, MD  magic mouthwash (nystatin, lidocaine, diphenhydrAMINE, alum & mag hydroxide) suspension Swish and spit 5 mLs 2 (two) times daily as needed for mouth pain. 04/01/23  Renne Crigler, FNP  Melatonin 10 MG CAPS Take 5 mg by mouth at bedtime.    [provider]  Multiple Vitamin (MULTIVITAMIN) capsule Take 1 capsule by mouth daily.    [provider]  Omega-3 Fatty Acids (FISH OIL) 1000 MG CAPS Take 1,000 mg by mouth 2 (two) times daily.    [provider]  omeprazole (PRILOSEC) 20 MG capsule Take 1 capsule (20 mg total) by mouth daily. 03/06/23   Cox, Fritzi Mandes, MD  ondansetron (ZOFRAN-ODT) 4 MG disintegrating  tablet Take 4 mg by mouth every 8 (eight) hours as needed for nausea or vomiting.    [provider]  rosuvastatin (CRESTOR) 40 MG tablet TAKE 1 TABLET BY MOUTH EVERY DAY AT BEDTIME 10/22/22   Tobb, Kardie, DO  sacubitril-valsartan (ENTRESTO) 24-26 MG TAKE 1 TABLET BY MOUTH IN THE MORNING AND 1 AT BEDTIME *NEW PRESCRIPTION REQUEST* 03/05/23   Tobb, Kardie, DO  Simethicone 250 MG CAPS Take 250 mg by mouth in the morning and at bedtime. 04/01/23 05/01/23  Renne Crigler, FNP  Vitamin D, Cholecalciferol, 25 MCG (1000 UT) CAPS Take 1,000 Units by mouth daily.    [provider]      Allergies    Patient has no known allergies.    Review of Systems   Review of Systems  Gastrointestinal:  Positive for diarrhea and vomiting.    Physical Exam Updated Vital Signs BP (!) 150/88   Pulse 83   Temp 98.4 F (36.9 C) (Oral)   Resp 18   Ht 5' 6.5" (1.689 m)   Wt 58.5 kg   SpO2 100%   BMI 20.51 kg/m  Physical Exam Vitals and nursing note reviewed.  Eyes:     Pupils: Pupils are equal, round, and reactive to light.  Cardiovascular:     Rate and Rhythm: Regular rhythm.  Abdominal:     Tenderness: There is abdominal tenderness.     Comments: Mild diffuse tenderness with some mild diffuse distention.  Skin:    Capillary Refill: Capillary refill takes less than 2 seconds.  Neurological:     Mental Status: She is alert.     ED Results / Procedures / Treatments   Labs (all labs ordered are listed, but only abnormal results are displayed) Labs Reviewed  COMPREHENSIVE METABOLIC PANEL - Abnormal; Notable for the following components:      Result Value   Sodium 130 (*)    Chloride 96 (*)    CO2 21 (*)    BUN 38 (*)    Creatinine, Ser 1.10 (*)    Total Bilirubin 2.1 (*)    GFR, Estimated 53 (*)    All other components within normal limits  GASTROINTESTINAL PANEL BY PCR, STOOL (REPLACES STOOL CULTURE)  LIPASE, BLOOD  CBC WITH DIFFERENTIAL/PLATELET     EKG None  Radiology CT ABDOMEN PELVIS WO CONTRAST Result Date: 04/02/2023 CLINICAL DATA:  Right lower quadrant abdominal pain with nausea, bilious vomiting and diarrhea for the past 4 days. EXAM: CT ABDOMEN AND PELVIS WITHOUT CONTRAST TECHNIQUE: Multidetector CT imaging of the abdomen and pelvis was performed following the standard protocol without IV contrast. RADIATION DOSE REDUCTION: This exam was performed according to the departmental dose-optimization program which includes automated exposure control, adjustment of the mA and/or kV according to patient size and/or use of iterative reconstruction technique. COMPARISON:  Chest radiographs dated 03/06/2021 FINDINGS: Lower chest: Normal sized heart with intracardiac pacer and AICD leads. Small right pleural effusion minimal right lower  lobe atelectasis. Hepatobiliary: Mild sludge or noncalcified gallstones in the dependent portion of the gallbladder. No gallbladder wall thickening or pericholecystic fluid. Normal-appearing liver. Pancreas: Unremarkable. No pancreatic ductal dilatation or surrounding inflammatory changes. Spleen: Normal in size without focal abnormality. Adrenals/Urinary Tract: Minimal urine in the urinary bladder. Normal-appearing adrenal glands, kidneys and ureters. Stomach/Bowel: Normal-appearing appendix containing a small appendicolith. Small hiatal hernia. Unremarkable colon. Multiple dilated, fluid-filled loops of small bowel with an abrupt transition to normal caliber terminal ileum in the right lower abdomen with no visible obstructing mass. Vascular/Lymphatic: No significant vascular findings are present. No enlarged abdominal or pelvic lymph nodes. Reproductive: Uterus and bilateral adnexa are unremarkable. Bilateral tubal ligation clips. Other: Small to moderate amount of free peritoneal fluid in the inferior, posterior pelvis. Small right inguinal hernia containing fat. Musculoskeletal: Mild-to-moderate bilateral hip  degenerative changes. Lumbar and lower thoracic spine degenerative changes. These include facet degenerative changes with associated minimal anterolisthesis at the L4-5 level and grade 1 retrolisthesis at the L5-S1 level there is also moderate to marked foraminal stenosis on the right at the L5-S1 level. IMPRESSION: 1. Moderate partial small bowel obstruction with an abrupt transition to normal caliber terminal ileum in the right lower abdomen. This may be due to an adhesion. 2. Small to moderate amount of free peritoneal fluid in the inferior, posterior pelvis. 3. Small right pleural effusion with minimal right lower lobe atelectasis. 4. Mild sludge or noncalcified gallstones in the dependent portion of the gallbladder. 5. Small hiatal hernia. Electronically Signed   By: Beckie Salts M.D.   On: 04/02/2023 15:13    Procedures Procedures    Medications Ordered in ED Medications  ondansetron (ZOFRAN) injection 4 mg (4 mg Intravenous Given 04/03/23 1457)  sodium chloride 0.9 % bolus 500 mL (0 mLs Intravenous Stopped 04/03/23 1604)  diatrizoate meglumine-sodium (GASTROGRAFIN) 66-10 % solution 90 mL (90 mLs Oral Given 04/03/23 1530)    ED Course/ Medical Decision Making/ A&P                                 Medical Decision Making Amount and/or Complexity of Data Reviewed Labs: ordered. Radiology: ordered.  Risk Prescription drug management.   Patient with nausea vomiting diarrhea.  Outpatient CT showed bowel obstruction with likely adhesions.  Will get blood work.  Will discuss with general surgery.  Reviewed PCP note.  Blood work does show some potential dehydration.  Has been seen by general surgery, with whom I discussed the patient.  They will follow.  No NG tube at this time unless begins vomiting again.  Will discuss with hospitalist for admission.         Final Clinical Impression(s) / ED Diagnoses Final diagnoses:  Small bowel obstruction [facility])    Rx / DC Orders ED  Discharge Orders     None         Benjiman Core, MD 04/03/23 1404    Benjiman Core, MD 04/03/23 [phone]

## 2023-04-03 NOTE — Telephone Encounter (Signed)
Please review CT results, you can send them to me and I will call patient back  Copied from CRM [id number]. Topic: Clinical - Lab/Test Results >> Apr 03, 2023 11:00 AM Fuller Mandril wrote: Reason for CRM: Patient would like someone to call her regarding CT scan results. States the place said it was a stat and she should receive call within the hour but she has not heard anything. Thank You

## 2023-04-03 NOTE — Plan of Care (Signed)
Problem: Education: Goal: Ability to describe self-care measures that may prevent or decrease complications (Diabetes Survival Skills Education) will improve Outcome: Progressing   Problem: Nutritional: Goal: Maintenance of adequate nutrition will improve Outcome: Progressing   Problem: Skin Integrity: Goal: Risk for impaired skin integrity will decrease Outcome: Progressing

## 2023-04-03 NOTE — ED Notes (Signed)
Patient finished drinking gastrografin; x-ray made aware.

## 2023-04-03 NOTE — ED Provider Notes (Signed)
4:42 PM Care assumed from Dr. Rubin Payor.  At time of transfer of care, patient is waiting for callback from admitting medicine team as patient found to have small bowel traction on CT yesterday.  Surgery has reportedly seen patient and no NG tube at this time as patient is not actively vomiting but they will follow with recommendations but she needs a medicine admission   Audrey Ruiz, Canary Brim, MD 04/03/23 2131

## 2023-04-03 NOTE — H&P (Signed)
[facility] History and Physical  Audrey Ruiz ZOX:[id number] DOB: Jul 09, 1950 DOA: 04/03/2023 PCP: Blane Ohara, MD  Presented from: Home Chief Complaint: Nausea vomiting, diarrhea  History of Present Illness: Audrey Ruiz is a 73 y.o. female with PMH significant for DM2, HTN, HLD, DCM, nonischemic cardiomyopathy s/p AICD, h/o breast cancer, hypothyroidism, GERD, anxiety/depression. Lives at home, takes care of her husband who has dementia. Patient started having NVD 5 days ago.  Symptoms have been persistent and she has not been able to keep anything down.  Last bowel movement was this morning and seemed to have slowed down since then. 2/11, was seen by her PCP.  CT scan was obtained yesterday which resulted to show 1. Moderate partial small bowel obstruction with an abrupt transition to normal caliber terminal ileum in the right lower abdomen. This may be due to an adhesion. 2. Small to moderate amount of free peritoneal fluid in the inferior, posterior pelvis.  Patient was sent to the ED for further evaluation and management. In the ED, patient was afebrile, heart rate in 80s, blood pressure 160s, breathing on room air. Labs with CBC unremarkable, BMP with sodium low at 130, BUN/creatinine 38/1.1. General surgery was consulted in the ED. Hospital service consulted for conservative management of bowel obstruction.  At the time of my evaluation, patient was lying down in bed.  Not in distress.  Sister at bedside. History reviewed and detailed as above.  Review of Systems:  All systems were reviewed and were negative unless otherwise mentioned in the HPI   Past medical history: Past Medical History:  Diagnosis Date   Abnormal nuclear stress test 08/08/2020   Abnormal nuclear stress test 08/08/2020   Anxiety    Closed fracture of distal end of left radius 08/16/2020   Depressed left ventricular ejection fraction    Depression    Diabetes mellitus  without complication (HCC)    Dilated cardiomyopathy (HCC) 08/08/2020   Encounter for Medicare annual wellness exam 10/19/2019   Gastroesophageal reflux disease 10/19/2019   GERD (gastroesophageal reflux disease)    Hydropneumothorax 02/08/2021   Hyperlipidemia    Hyperlipidemia associated with type 2 diabetes mellitus (HCC) 10/19/2019   LBBB (left bundle branch block) 08/17/2020   Mitral valve prolapse    Nonischemic cardiomyopathy (HCC) 08/17/2020   Osteopenia 10/19/2019   Sinusitis 08/16/2019   Upper respiratory tract infection due to COVID-19 virus 03/18/2021    Past surgical history: Past Surgical History:  Procedure Laterality Date   BIV ICD INSERTION CRT-D N/A 02/05/2021   Procedure: BIV ICD INSERTION CRT-D;  Surgeon: Regan Lemming, MD;  Location: [facility] INVASIVE CV LAB;  Service: Cardiovascular;  Laterality: N/A;   BREAST LUMPECTOMY Left 2010   CATARACT EXTRACTION BILATERAL W/ ANTERIOR VITRECTOMY  2021   EYE SURGERY Bilateral    Cataract Removal   LEFT HEART CATH AND CORONARY ANGIOGRAPHY N/A 08/08/2020   Procedure: LEFT HEART CATH AND CORONARY ANGIOGRAPHY;  Surgeon: Marykay Lex, MD;  Location: [facility] INVASIVE CV LAB;  Service: Cardiovascular;  Laterality: N/A;    Social History:  reports that she has never smoked. She has never used smokeless tobacco. She reports current alcohol use of about 2.0 standard drinks of alcohol per week. She reports that she does not use drugs.  Allergies:  No Known Allergies Patient has no known allergies.   Family history:  Family History  Problem Relation Age of Onset   Osteoporosis Mother    Heart disease Father  Hyperlipidemia Father    Coronary artery disease Father    Diabetes Sister    Breast cancer Daughter    Diabetes Maternal Grandfather      Physical Exam: Vitals:   04/03/23 1350 04/03/23 1445 04/03/23 1600 04/03/23 1752  BP: (!) 165/91 (!) 147/93 (!) 150/88 (!) 148/82  Pulse: 88 82 83 91  Resp: 20 17 18 18    Temp: 98.4 F (36.9 C)   98.2 F (36.8 C)  TempSrc: Oral   Oral  SpO2: 100% 100% 100% 100%  Weight:      Height:       Wt Readings from Last 3 Encounters:  04/03/23 58.5 kg  04/01/23 60.7 kg  03/07/23 63 kg   Body mass index is 20.51 kg/m.  General exam: Pleasant, elderly Caucasian female Skin: No rashes, lesions or ulcers. HEENT: Atraumatic, normocephalic, no obvious bleeding Lungs: Clear to auscultation bilaterally,  CVS: S1, S2, no murmur,   GI/Abd: Soft, mild generalized tenderness present, nondistended, bowel sound present,   CNS: Alert, awake, oriented x 3 Psychiatry: Mood appropriate,  Extremities: No pedal edema, no calf tenderness,    ----------------------------------------------------------------------------------------------------------------------------------------- ----------------------------------------------------------------------------------------------------------------------------------------- -----------------------------------------------------------------------------------------------------------------------------------------  Assessment/Plan: Principal Problem:   SBO (small bowel obstruction) (HCC)  PSBO Gastroenteritis Patient's symptoms started as gastroenteritis 5 days ago but now imaging concerning for partial small bowel obstruction due to adhesions. Seen by general surgery Not vomiting so NG tube deferred GI pathogen panel ordered Started on conservative management of bowel obstruction with bowel rest, IV fluid, IV analgesics, IV antiemetics. General surgery to plan for small bowel protocol No need of antibiotics at this time. Recent Labs  Lab 04/03/23 1425  WBC 7.7   Hyponatremia Sodium level low at 130 due to poor oral intake and diarrhea loss.  Monitor on IV fluids Recent Labs  Lab 04/03/23 1425  NA 130*   Type 2 diabetes mellitus A1c 5.25 December 2022 PTA meds-none Start SSI/Accu-Cheks Recent Labs  Lab 04/03/23 1757   GLUCAP 76   DCM HTN PTA meds- Entresto Hold for now.  Resume when oral intake is better  HLD Zetia, Crestor to resume when oral intake is better  Hypothyroidism Synthroid resumed  GERD PPI IV   anxiety/depression Prozac to resume later  Mobility: Encourage ambulation  Goals of care:   Code Status: Full Code    DVT prophylaxis:  enoxaparin (LOVENOX) injection 40 mg Start: 04/03/23 1700   Antimicrobials: None Fluid: NS at 75 mL/h Consultants: General surgery Family Communication: Sister at bedside  Dispo: The patient is from: Home              Anticipated d/c is to: Pending clinical course  Diet: Diet Order             Diet NPO time specified Except for: Sips with Meds, Ice Chips  Diet effective now                    ------------------------------------------------------------------------------------- Severity of Illness: The appropriate patient status for this patient is OBSERVATION. Observation status is judged to be reasonable and necessary in order to provide the required intensity of service to ensure the patient's safety. The patient's presenting symptoms, physical exam findings, and initial radiographic and laboratory data in the context of their medical condition is felt to place them at decreased risk for further clinical deterioration. Furthermore, it is anticipated that the patient will be medically stable for discharge from the hospital within 2 midnights of admission.  -------------------------------------------------------------------------------------   Home Meds:  Prior to Admission medications   Medication Sig Start Date End Date Taking? Authorizing Provider  cetirizine (ALLERGY RELIEF CETIRIZINE) 10 MG tablet Take 1 tablet (10 mg total) by mouth at bedtime. 03/06/23   Cox, Fritzi Mandes, MD  ezetimibe (ZETIA) 10 MG tablet Take 1 tablet (10 mg total) by mouth at bedtime. 03/06/23   Cox, Fritzi Mandes, MD  FLUoxetine (PROZAC) 40 MG capsule Take 1 capsule  (40 mg total) by mouth daily. 03/06/23   Cox, Fritzi Mandes, MD  fluticasone (FLONASE) 50 MCG/ACT nasal spray USE 2 SPRAYS INTO EACH NOSTRIL ONCE DAILY *NEW PRESCRIPTION REQUEST* 03/06/23   Cox, Fritzi Mandes, MD  Glucosamine-Chondroitin-MSM 1500-1200-500 MG PACK Take 1 tablet by mouth daily.    [provider]  ibuprofen (ADVIL) 200 MG tablet Take 200 mg by mouth every 8 (eight) hours as needed for moderate pain.    [provider]  levothyroxine (SYNTHROID) 25 MCG tablet Take 1 tablet (25 mcg total) by mouth daily before breakfast. 03/06/23   Cox, Fritzi Mandes, MD  magic mouthwash (nystatin, lidocaine, diphenhydrAMINE, alum & mag hydroxide) suspension Swish and spit 5 mLs 2 (two) times daily as needed for mouth pain. 04/01/23   Renne Crigler, FNP  Melatonin 10 MG CAPS Take 5 mg by mouth at bedtime.    [provider]  Multiple Vitamin (MULTIVITAMIN) capsule Take 1 capsule by mouth daily.    [provider]  Omega-3 Fatty Acids (FISH OIL) 1000 MG CAPS Take 1,000 mg by mouth 2 (two) times daily.    [provider]  omeprazole (PRILOSEC) 20 MG capsule Take 1 capsule (20 mg total) by mouth daily. 03/06/23   Cox, Fritzi Mandes, MD  ondansetron (ZOFRAN-ODT) 4 MG disintegrating tablet Take 4 mg by mouth every 8 (eight) hours as needed for nausea or vomiting.    [provider]  rosuvastatin (CRESTOR) 40 MG tablet TAKE 1 TABLET BY MOUTH EVERY DAY AT BEDTIME 10/22/22   Tobb, Kardie, DO  sacubitril-valsartan (ENTRESTO) 24-26 MG TAKE 1 TABLET BY MOUTH IN THE MORNING AND 1 AT BEDTIME *NEW PRESCRIPTION REQUEST* 03/05/23   Tobb, Kardie, DO  Simethicone 250 MG CAPS Take 250 mg by mouth in the morning and at bedtime. 04/01/23 05/01/23  Renne Crigler, FNP  Vitamin D, Cholecalciferol, 25 MCG (1000 UT) CAPS Take 1,000 Units by mouth daily.    [provider]    Labs on Admission:   CBC: Recent Labs  Lab 04/03/23 1425  WBC 7.7  NEUTROABS 5.2  HGB 13.7  HCT 41.0  MCV 92.1   PLT 271    Basic Metabolic Panel: Recent Labs  Lab 04/03/23 1425  NA 130*  K 3.6  CL 96*  CO2 21*  GLUCOSE 81  BUN 38*  CREATININE 1.10*  CALCIUM 10.3    Liver Function Tests: Recent Labs  Lab 04/03/23 1425  AST 25  ALT 16  ALKPHOS 45  BILITOT 2.1*  PROT 7.0  ALBUMIN 4.0   Recent Labs  Lab 04/03/23 1425  LIPASE 33   No results for input(s): "AMMONIA" in the last 168 hours.  Cardiac Enzymes: No results for input(s): "CKTOTAL", "CKMB", "CKMBINDEX", "TROPONINI" in the last 168 hours.  BNP (last 3 results) No results for input(s): "BNP" in the last 8760 hours.  ProBNP (last 3 results) No results for input(s): "PROBNP" in the last 8760 hours.  CBG: Recent Labs  Lab 04/03/23 1757  GLUCAP 76    Lipase     Component Value Date/Time   LIPASE 33 04/03/2023  1425     Urinalysis    Component Value Date/Time   BILIRUBINUR negative 02/27/2023 1644   KETONESUR negative 02/27/2023 1644   UROBILINOGEN 0.2 02/27/2023 1644   NITRITE Negative 02/27/2023 1644   LEUKOCYTESUR Negative 02/27/2023 1644     Drugs of Abuse  No results found for: "LABOPIA", "COCAINSCRNUR", "LABBENZ", "AMPHETMU", "THCU", "LABBARB"    Radiological Exams on Admission: CT ABDOMEN PELVIS WO CONTRAST Result Date: 04/02/2023 CLINICAL DATA:  Right lower quadrant abdominal pain with nausea, bilious vomiting and diarrhea for the past 4 days. EXAM: CT ABDOMEN AND PELVIS WITHOUT CONTRAST TECHNIQUE: Multidetector CT imaging of the abdomen and pelvis was performed following the standard protocol without IV contrast. RADIATION DOSE REDUCTION: This exam was performed according to the departmental dose-optimization program which includes automated exposure control, adjustment of the mA and/or kV according to patient size and/or use of iterative reconstruction technique. COMPARISON:  Chest radiographs dated 03/06/2021 FINDINGS: Lower chest: Normal sized heart with intracardiac pacer and AICD leads. Small  right pleural effusion minimal right lower lobe atelectasis. Hepatobiliary: Mild sludge or noncalcified gallstones in the dependent portion of the gallbladder. No gallbladder wall thickening or pericholecystic fluid. Normal-appearing liver. Pancreas: Unremarkable. No pancreatic ductal dilatation or surrounding inflammatory changes. Spleen: Normal in size without focal abnormality. Adrenals/Urinary Tract: Minimal urine in the urinary bladder. Normal-appearing adrenal glands, kidneys and ureters. Stomach/Bowel: Normal-appearing appendix containing a small appendicolith. Small hiatal hernia. Unremarkable colon. Multiple dilated, fluid-filled loops of small bowel with an abrupt transition to normal caliber terminal ileum in the right lower abdomen with no visible obstructing mass. Vascular/Lymphatic: No significant vascular findings are present. No enlarged abdominal or pelvic lymph nodes. Reproductive: Uterus and bilateral adnexa are unremarkable. Bilateral tubal ligation clips. Other: Small to moderate amount of free peritoneal fluid in the inferior, posterior pelvis. Small right inguinal hernia containing fat. Musculoskeletal: Mild-to-moderate bilateral hip degenerative changes. Lumbar and lower thoracic spine degenerative changes. These include facet degenerative changes with associated minimal anterolisthesis at the L4-5 level and grade 1 retrolisthesis at the L5-S1 level there is also moderate to marked foraminal stenosis on the right at the L5-S1 level. IMPRESSION: 1. Moderate partial small bowel obstruction with an abrupt transition to normal caliber terminal ileum in the right lower abdomen. This may be due to an adhesion. 2. Small to moderate amount of free peritoneal fluid in the inferior, posterior pelvis. 3. Small right pleural effusion with minimal right lower lobe atelectasis. 4. Mild sludge or noncalcified gallstones in the dependent portion of the gallbladder. 5. Small hiatal hernia. Electronically  Signed   By: Beckie Salts M.D.   On: 04/02/2023 15:13     Signed, Lorin Glass, MD [facility] 04/03/2023

## 2023-04-03 NOTE — ED Triage Notes (Signed)
Patient presents via EMS for nausea, vomiting, and diarrhea for one week. Per EMS, patient had a CT done yesterday at a medical center and they called her today stating that she had a bowel obstruction and recommended that she come to the hospital. Patient reports abdominal pain only with palpation. Patient arrives AAOx4; speaking in clear, full sentences.

## 2023-04-03 NOTE — Telephone Encounter (Signed)
Called patient this morning in regards to the results of her CT which showed a small bowel obstruction.  Advised patient to go to the emergency department after she explained that she was feeling no better and that her stools have turned from yellow to black.  I told the patient to go to [facility] and that I would call nurse triage to let them know she was on the way.

## 2023-04-03 NOTE — Consult Note (Signed)
Audrey Ruiz 01-10-1951  [id number].    Requesting MD: Dr. Benjiman Core Chief Complaint/Reason for Consult: psbo vs enteritis  HPI:  This is a 73 yo female with a history of HTN, DM, dilated cardiomyopathy, hypothyroidism, hx of breast cancer, HLD, and GERD who began having N/V/D last Friday after taking care of her elderly aunts.  She takes care of her husband at home who has dementia.  She states she has not been able to keep anything down until this morning she had a small bite of biscuit.  She admits to some diarrhea still this morning and a small amount of flatus, but this has slowed down some in the last day or so.  She admits to subjective fevers and fatigue/weakness.  She went to an urgent care yesterday where she was sent to get a CT scan that revealed psbo.  She was referred to the ED for further evaluation and we have been called.    ROS: ROS: see HPI, otherwise complains of oral thrush that has been treated with nystatin but returned  Family History  Problem Relation Age of Onset   Osteoporosis Mother    Heart disease Father    Hyperlipidemia Father    Coronary artery disease Father    Diabetes Sister    Breast cancer Daughter    Diabetes Maternal Grandfather     Past Medical History:  Diagnosis Date   Abnormal nuclear stress test 08/08/2020   Abnormal nuclear stress test 08/08/2020   Anxiety    Closed fracture of distal end of left radius 08/16/2020   Depressed left ventricular ejection fraction    Depression    Diabetes mellitus without complication (HCC)    Dilated cardiomyopathy (HCC) 08/08/2020   Encounter for Medicare annual wellness exam 10/19/2019   Gastroesophageal reflux disease 10/19/2019   GERD (gastroesophageal reflux disease)    Hydropneumothorax 02/08/2021   Hyperlipidemia    Hyperlipidemia associated with type 2 diabetes mellitus (HCC) 10/19/2019   LBBB (left bundle branch block) 08/17/2020   Mitral valve prolapse     Nonischemic cardiomyopathy (HCC) 08/17/2020   Osteopenia 10/19/2019   Sinusitis 08/16/2019   Upper respiratory tract infection due to COVID-19 virus 03/18/2021    Past Surgical History:  Procedure Laterality Date   BIV ICD INSERTION CRT-D N/A 02/05/2021   Procedure: BIV ICD INSERTION CRT-D;  Surgeon: Regan Lemming, MD;  Location: [facility] INVASIVE CV LAB;  Service: Cardiovascular;  Laterality: N/A;   BREAST LUMPECTOMY Left 2010   CATARACT EXTRACTION BILATERAL W/ ANTERIOR VITRECTOMY  2021   EYE SURGERY Bilateral    Cataract Removal   LEFT HEART CATH AND CORONARY ANGIOGRAPHY N/A 08/08/2020   Procedure: LEFT HEART CATH AND CORONARY ANGIOGRAPHY;  Surgeon: Marykay Lex, MD;  Location: [facility] INVASIVE CV LAB;  Service: Cardiovascular;  Laterality: N/A;    Social History:  reports that she has never smoked. She has never used smokeless tobacco. She reports current alcohol use of about 2.0 standard drinks of alcohol per week. She reports that she does not use drugs.  Allergies: No Known Allergies  (Not in a hospital admission)    Physical Exam: Blood pressure (!) 147/93, pulse 82, temperature 98.4 F (36.9 C), temperature source Oral, resp. rate 17, height 5' 6.5" (1.689 m), weight 58.5 kg, SpO2 100%. General: pleasant, WD, WN white female who is laying in bed in NAD HEENT: head is normocephalic, atraumatic.  Sclera are noninjected.  PERRL.  Ears and nose without any  masses or lesions.  Mouth is pink and moist Heart: regular, rate, and rhythm.  Normal s1,s2. No obvious murmurs, gallops, or rubs noted.  Pacemaker in place in LUC Lungs: CTAB, no wheezes, rhonchi, or rales noted.  Respiratory effort nonlabored Abd: soft, mild soreness, but no rigidity or guarding, mild distention, +BS, no masses, hernias, or organomegaly MS: all 4 extremities are symmetrical with no cyanosis, clubbing, or edema. Psych: A&Ox3 with an appropriate affect, but does seem a bit forgetful at times.   Results for  orders placed or performed during the hospital encounter of 04/03/23 (from the past 48 hours)  CBC with Differential     Status: None   Collection Time: 04/03/23  2:25 PM  Result Value Ref Range   WBC 7.7 4.0 - 10.5 K/uL   RBC 4.45 3.87 - 5.11 MIL/uL   Hemoglobin 13.7 12.0 - 15.0 g/dL   HCT 16.1 09.6 - 04.5 %   MCV 92.1 80.0 - 100.0 fL   MCH 30.8 26.0 - 34.0 pg   MCHC 33.4 30.0 - 36.0 g/dL   RDW 40.9 81.1 - 91.4 %   Platelets 271 150 - 400 K/uL   nRBC 0.0 0.0 - 0.2 %   Neutrophils Relative % 66 %   Neutro Abs 5.2 1.7 - 7.7 K/uL   Lymphocytes Relative 23 %   Lymphs Abs 1.7 0.7 - 4.0 K/uL   Monocytes Relative 8 %   Monocytes Absolute 0.6 0.1 - 1.0 K/uL   Eosinophils Relative 2 %   Eosinophils Absolute 0.1 0.0 - 0.5 K/uL   Basophils Relative 1 %   Basophils Absolute 0.1 0.0 - 0.1 K/uL   Immature Granulocytes 0 %   Abs Immature Granulocytes 0.01 0.00 - 0.07 K/uL    Comment: Performed at [facility], [address], [place], [place] [address]   CT ABDOMEN PELVIS WO CONTRAST Result Date: 04/02/2023 CLINICAL DATA:  Right lower quadrant abdominal pain with nausea, bilious vomiting and diarrhea for the past 4 days. EXAM: CT ABDOMEN AND PELVIS WITHOUT CONTRAST TECHNIQUE: Multidetector CT imaging of the abdomen and pelvis was performed following the standard protocol without IV contrast. RADIATION DOSE REDUCTION: This exam was performed according to the departmental dose-optimization program which includes automated exposure control, adjustment of the mA and/or kV according to patient size and/or use of iterative reconstruction technique. COMPARISON:  Chest radiographs dated 03/06/2021 FINDINGS: Lower chest: Normal sized heart with intracardiac pacer and AICD leads. Small right pleural effusion minimal right lower lobe atelectasis. Hepatobiliary: Mild sludge or noncalcified gallstones in the dependent portion of the gallbladder. No gallbladder wall thickening or pericholecystic fluid.  Normal-appearing liver. Pancreas: Unremarkable. No pancreatic ductal dilatation or surrounding inflammatory changes. Spleen: Normal in size without focal abnormality. Adrenals/Urinary Tract: Minimal urine in the urinary bladder. Normal-appearing adrenal glands, kidneys and ureters. Stomach/Bowel: Normal-appearing appendix containing a small appendicolith. Small hiatal hernia. Unremarkable colon. Multiple dilated, fluid-filled loops of small bowel with an abrupt transition to normal caliber terminal ileum in the right lower abdomen with no visible obstructing mass. Vascular/Lymphatic: No significant vascular findings are present. No enlarged abdominal or pelvic lymph nodes. Reproductive: Uterus and bilateral adnexa are unremarkable. Bilateral tubal ligation clips. Other: Small to moderate amount of free peritoneal fluid in the inferior, posterior pelvis. Small right inguinal hernia containing fat. Musculoskeletal: Mild-to-moderate bilateral hip degenerative changes. Lumbar and lower thoracic spine degenerative changes. These include facet degenerative changes with associated minimal anterolisthesis at the L4-5 level and grade 1 retrolisthesis at  the L5-S1 level there is also moderate to marked foraminal stenosis on the right at the L5-S1 level. IMPRESSION: 1. Moderate partial small bowel obstruction with an abrupt transition to normal caliber terminal ileum in the right lower abdomen. This may be due to an adhesion. 2. Small to moderate amount of free peritoneal fluid in the inferior, posterior pelvis. 3. Small right pleural effusion with minimal right lower lobe atelectasis. 4. Mild sludge or noncalcified gallstones in the dependent portion of the gallbladder. 5. Small hiatal hernia. Electronically Signed   By: Beckie Salts M.D.   On: 04/02/2023 15:13      Assessment/Plan Gastroenteritis vs psbo The patient has been seen, examined, labs, vitals, chart, and imaging personally reviewed.  She does have some  narrowing at an area in her SB but it is dilated proximally and distally to this area.  Her clinical history is more concerning for gastroenteritis, but she has had a tubal ligation so could have adhesions.  She is not having any active vomiting right now so will hold on NGT.  Will start the SBO protocol orally.  If she starts to vomit, then may need an NGT.  Family and patient request a GI pathogen panel which I think is reasonable.  We will follow along, but expect improvement with conservative management.  If she fails to improve, we did discuss the possibility of surgical intervention.   FEN - NPO/IVFs/few ice chips/sips with meds VTE - ok from our standpoint ID - none currently needed from an abdominal standpoint Admit - per medicine  I reviewed ED provider notes, last 24 h vitals and pain scores, last 48 h intake and output, last 24 h labs and trends, and last 24 h imaging results.  Letha Cape, [facility] Surgery 04/03/2023, 3:09 PM Please see Amion for pager number during day hours 7:00am-4:30pm or 7:00am -11:30am on weekends

## 2023-04-04 ENCOUNTER — Observation Stay (HOSPITAL_COMMUNITY): Payer: PPO

## 2023-04-04 DIAGNOSIS — E039 Hypothyroidism, unspecified: Secondary | ICD-10-CM | POA: Diagnosis not present

## 2023-04-04 DIAGNOSIS — F32A Depression, unspecified: Secondary | ICD-10-CM | POA: Diagnosis not present

## 2023-04-04 DIAGNOSIS — I420 Dilated cardiomyopathy: Secondary | ICD-10-CM | POA: Diagnosis not present

## 2023-04-04 DIAGNOSIS — E871 Hypo-osmolality and hyponatremia: Secondary | ICD-10-CM | POA: Diagnosis not present

## 2023-04-04 DIAGNOSIS — E785 Hyperlipidemia, unspecified: Secondary | ICD-10-CM | POA: Diagnosis not present

## 2023-04-04 DIAGNOSIS — Z95810 Presence of automatic (implantable) cardiac defibrillator: Secondary | ICD-10-CM | POA: Diagnosis not present

## 2023-04-04 DIAGNOSIS — E119 Type 2 diabetes mellitus without complications: Secondary | ICD-10-CM | POA: Diagnosis not present

## 2023-04-04 DIAGNOSIS — K56609 Unspecified intestinal obstruction, unspecified as to partial versus complete obstruction: Secondary | ICD-10-CM | POA: Diagnosis not present

## 2023-04-04 DIAGNOSIS — Z803 Family history of malignant neoplasm of breast: Secondary | ICD-10-CM | POA: Diagnosis not present

## 2023-04-04 DIAGNOSIS — K219 Gastro-esophageal reflux disease without esophagitis: Secondary | ICD-10-CM | POA: Diagnosis not present

## 2023-04-04 DIAGNOSIS — E782 Mixed hyperlipidemia: Secondary | ICD-10-CM | POA: Diagnosis not present

## 2023-04-04 DIAGNOSIS — I341 Nonrheumatic mitral (valve) prolapse: Secondary | ICD-10-CM | POA: Diagnosis not present

## 2023-04-04 DIAGNOSIS — Z853 Personal history of malignant neoplasm of breast: Secondary | ICD-10-CM | POA: Diagnosis not present

## 2023-04-04 DIAGNOSIS — K529 Noninfective gastroenteritis and colitis, unspecified: Secondary | ICD-10-CM | POA: Diagnosis not present

## 2023-04-04 DIAGNOSIS — R112 Nausea with vomiting, unspecified: Secondary | ICD-10-CM | POA: Diagnosis not present

## 2023-04-04 DIAGNOSIS — E1169 Type 2 diabetes mellitus with other specified complication: Secondary | ICD-10-CM | POA: Diagnosis not present

## 2023-04-04 DIAGNOSIS — K2100 Gastro-esophageal reflux disease with esophagitis, without bleeding: Secondary | ICD-10-CM

## 2023-04-04 DIAGNOSIS — Z4682 Encounter for fitting and adjustment of non-vascular catheter: Secondary | ICD-10-CM | POA: Diagnosis not present

## 2023-04-04 DIAGNOSIS — K5651 Intestinal adhesions [bands], with partial obstruction: Secondary | ICD-10-CM | POA: Diagnosis not present

## 2023-04-04 DIAGNOSIS — M8580 Other specified disorders of bone density and structure, unspecified site: Secondary | ICD-10-CM | POA: Diagnosis not present

## 2023-04-04 DIAGNOSIS — N179 Acute kidney failure, unspecified: Secondary | ICD-10-CM | POA: Diagnosis not present

## 2023-04-04 DIAGNOSIS — I110 Hypertensive heart disease with heart failure: Secondary | ICD-10-CM | POA: Diagnosis not present

## 2023-04-04 DIAGNOSIS — I5022 Chronic systolic (congestive) heart failure: Secondary | ICD-10-CM | POA: Diagnosis not present

## 2023-04-04 DIAGNOSIS — Z8616 Personal history of COVID-19: Secondary | ICD-10-CM | POA: Diagnosis not present

## 2023-04-04 DIAGNOSIS — Z8349 Family history of other endocrine, nutritional and metabolic diseases: Secondary | ICD-10-CM | POA: Diagnosis not present

## 2023-04-04 DIAGNOSIS — Z8262 Family history of osteoporosis: Secondary | ICD-10-CM | POA: Diagnosis not present

## 2023-04-04 DIAGNOSIS — K5669 Other partial intestinal obstruction: Secondary | ICD-10-CM | POA: Diagnosis not present

## 2023-04-04 DIAGNOSIS — Z833 Family history of diabetes mellitus: Secondary | ICD-10-CM | POA: Diagnosis not present

## 2023-04-04 DIAGNOSIS — Z8249 Family history of ischemic heart disease and other diseases of the circulatory system: Secondary | ICD-10-CM | POA: Diagnosis not present

## 2023-04-04 DIAGNOSIS — E861 Hypovolemia: Secondary | ICD-10-CM | POA: Diagnosis not present

## 2023-04-04 DIAGNOSIS — Z79890 Hormone replacement therapy (postmenopausal): Secondary | ICD-10-CM | POA: Diagnosis not present

## 2023-04-04 LAB — GLUCOSE, CAPILLARY
Glucose-Capillary: 85 mg/dL (ref 70–99)
Glucose-Capillary: 75 mg/dL (ref 70–99)
Glucose-Capillary: 90 mg/dL (ref 70–99)
Glucose-Capillary: 115 mg/dL — ABNORMAL HIGH (ref 70–99)
Glucose-Capillary: 119 mg/dL — ABNORMAL HIGH (ref 70–99)

## 2023-04-04 LAB — CBC
HCT: 38.2 % (ref 36.0–46.0)
Hemoglobin: 12.7 g/dL (ref 12.0–15.0)
MCH: 31.1 pg (ref 26.0–34.0)
MCHC: 33.2 g/dL (ref 30.0–36.0)
MCV: 93.4 fL (ref 80.0–100.0)
Platelets: 254 10*3/uL (ref 150–400)
RBC: 4.09 MIL/uL (ref 3.87–5.11)
RDW: 12.5 % (ref 11.5–15.5)
WBC: 7 10*3/uL (ref 4.0–10.5)
nRBC: 0 % (ref 0.0–0.2)

## 2023-04-04 LAB — BASIC METABOLIC PANEL
Anion gap: 15 (ref 5–15)
BUN: 38 mg/dL — ABNORMAL HIGH (ref 8–23)
CO2: 21 mmol/L — ABNORMAL LOW (ref 22–32)
Calcium: 10 mg/dL (ref 8.9–10.3)
Chloride: 98 mmol/L (ref 98–111)
Creatinine, Ser: 1.24 mg/dL — ABNORMAL HIGH (ref 0.44–1.00)
GFR, Estimated: 46 mL/min — ABNORMAL LOW (ref >60)
Glucose, Bld: 85 mg/dL (ref 70–99)
Potassium: 3.6 mmol/L (ref 3.5–5.1)
Sodium: 134 mmol/L — ABNORMAL LOW (ref 135–145)

## 2023-04-04 MED ORDER — DEXTROSE-SODIUM CHLORIDE 5-0.45 % IV SOLN: INTRAVENOUS | Status: AC

## 2023-04-04 MED ORDER — SODIUM CHLORIDE 0.9 % IV SOLN: INTRAVENOUS | Status: DC

## 2023-04-04 MED ORDER — BISACODYL 10 MG RE SUPP
10.0000 mg | Freq: Once | RECTAL | Status: AC
  Administered 2023-04-04: 10 mg via RECTAL
  Filled 2023-04-04: qty 1

## 2023-04-04 NOTE — Progress Notes (Addendum)
[facility]                                                                              Audrey Ruiz, is a 73 y.o. female, DOB - [birth year], FUX:[id number] Admit date - 04/03/2023    Outpatient Primary MD for the patient is Cox, Kirsten, MD  LOS - 0  days  Chief Complaint  Patient presents with   Nausea   Emesis   Diarrhea       Brief summary   Patient is a 73 year old female with DM type II, HTN, HLP, nonischemic cardiomyopathy status post AICD, history of breast CA, hypothyroidism, GERD, anxiety lives at home and takes care of her husband who has dementia.  Patient reported nausea vomiting diarrhea that started 5 days ago.  Symptoms have been persistent and she has not been able to keep anything down.  Last BM on the morning of admission and seems to have slowed down since then. CT abdomen showed moderate partial SBO with abrupt transition to normal caliber terminal ileum in the right lower abdomen, may be due to an adhesion, small to moderate amount of free peritoneal fluid in the inferior posterior pelvis. General surgery was consulted and was admitted for further workup.  Assessment & Plan    Principal Problem: Partial SBO (small bowel obstruction) (HCC), gastroenteritis -Initially symptoms started as gastroenteritis 5 days prior to admission, imaging now concerning for partial small bowel obstruction due to adhesions -Currently on NGT to intermittent wall suction, -Continue n.p.o., IV fluids, pain control, antiemetics -General Surgery following -Dulcolax pulsatory x 1   Active Problems: Hyponatremia, hypovolemic -Sodium 130 on admission, likely due to diarrhea, poor p.o. intake -Continue IV fluid hydration, improving  Mild AKI -Baseline creatinine 0.6-0.7 -Today worsened to 1.2, continue IV fluid hydration -Continue to hold Entresto    Gastroesophageal reflux disease -Continue PPI     Hyperlipidemia, Nonischemic cardiomyopathy (HCC),  AICD -Currently n.p.o., Zetia, Crestor on hold    Chronic systolic congestive heart failure (HCC) -Prior echo 05/2021 had shown EF of 50 to 55%, G1 DD -Currently euvolemic, no acute issues, hold Entresto    Controlled type 2 diabetes mellitus without complication, without long-term current use of insulin (HCC) Continue sliding scale insulin while inpatient, currently every 4 hours as n.p.o. CBG (last 3)  Recent Labs    04/03/23 2022 04/03/23 2200 04/04/23 0539  GLUCAP 70 74 85  Continue D5 half-normal saline   Hypothyroidism -P.o. Synthroid currently on hold due to NGT to intermittent wall suction -If still needing NGT tomorrow, will change to IV Synthroid  History of breast CA -Per oncology (at [facility]), history of stage IIa (T3 N0 M0) hormone positive breast ca s/p lumpectomy in 12/2008, followed by adjuvant chemotherapy and radiation. She also took anastrozole for 6+ years for her adjuvant endocrine management. Mammograms since then have shown no evidence of disease recurrence.    Estimated body mass index is 20.51 kg/m as calculated from the following:   Height as of this encounter: 5' 6.5" (1.689 m).   Weight as of this encounter: 58.5 kg.  Code Status: Full CODE STATUS DVT Prophylaxis:  enoxaparin (LOVENOX)  injection 40 mg Start: 04/03/23 1700   Level of Care: Level of care: Med-Surg Family Communication: Updated patient's sister at the bedside Disposition Plan:      Remains inpatient appropriate:      Procedures:    Consultants:   General surgery  Antimicrobials:   Anti-infectives (From admission, onward)    None          Medications  bisacodyl  10 mg Rectal Once   enoxaparin (LOVENOX) injection  40 mg Subcutaneous Q24H   fentaNYL (SUBLIMAZE) injection  12.5 mcg Intravenous Once   insulin aspart  0-9 Units Subcutaneous Q4H   levothyroxine  25 mcg Oral QAC breakfast   pantoprazole (PROTONIX) IV  40 mg Intravenous QAC breakfast       Subjective:   Audrey Ruiz was seen and examined today.  Still no flatus or BM.  On NGT to intermittent wall suction.  No ongoing abdominal pain, fevers or chills.  Sister at the bedside.  Patient denies dizziness, chest pain, shortness of breath. Objective:   Vitals:   04/03/23 2347 04/03/23 2347 04/04/23 0534 04/04/23 0732  BP: (!) 146/89 (!) 146/89 130/72 (!) 152/73  Pulse: 97 97 92 94  Resp: 18 18 18 16   Temp:  98 F (36.7 C) 98.4 F (36.9 C) 98.6 F (37 C)  TempSrc:  Oral Oral Oral  SpO2: 96% 96% 98% 99%  Weight:      Height:        Intake/Output Summary (Last 24 hours) at 04/04/2023 0952 Last data filed at 04/04/2023 0534 Gross per 24 hour  Intake --  Output 2050 ml  Net -2050 ml     Wt Readings from Last 3 Encounters:  04/03/23 58.5 kg  04/01/23 60.7 kg  03/07/23 63 kg     Exam General: Alert and oriented x 3, NAD, NGT+ Cardiovascular: S1 S2 auscultated,  RRR Respiratory: Clear to auscultation bilaterally, no wheezing Gastrointestinal: Soft, hypoactive BS, minimal TTP Ext: no pedal edema bilaterally Neuro: no new deficits Psych: Normal affect     Data Reviewed:  I have personally reviewed following labs    CBC Lab Results  Component Value Date   WBC 7.0 04/04/2023   RBC 4.09 04/04/2023   HGB 12.7 04/04/2023   HCT 38.2 04/04/2023   MCV 93.4 04/04/2023   MCH 31.1 04/04/2023   PLT 254 04/04/2023   MCHC 33.2 04/04/2023   RDW 12.5 04/04/2023   LYMPHSABS 1.7 04/03/2023   MONOABS 0.6 04/03/2023   EOSABS 0.1 04/03/2023   BASOSABS 0.1 04/03/2023     Last metabolic panel Lab Results  Component Value Date   NA 134 (L) 04/04/2023   K 3.6 04/04/2023   CL 98 04/04/2023   CO2 21 (L) 04/04/2023   BUN 38 (H) 04/04/2023   CREATININE 1.24 (H) 04/04/2023   GLUCOSE 85 04/04/2023   GFRNONAA 46 (L) 04/04/2023   GFRAA 104 10/19/2019   CALCIUM 10.0 04/04/2023   PROT 7.0 04/03/2023   ALBUMIN 4.0 04/03/2023   LABGLOB 2.0 12/30/2022   AGRATIO  2.0 07/05/2022   BILITOT 2.1 (H) 04/03/2023   ALKPHOS 45 04/03/2023   AST 25 04/03/2023   ALT 16 04/03/2023   ANIONGAP 15 04/04/2023    CBG (last 3)  Recent Labs    04/03/23 2022 04/03/23 2200 04/04/23 0539  GLUCAP 70 74 85      Coagulation Profile: No results for input(s): "INR", "PROTIME" in the last 168 hours.   Radiology Studies: I have personally reviewed  the imaging studies  DG Abd 1 View Result Date: 04/03/2023 CLINICAL DATA:  Enteric catheter placement EXAM: ABDOMEN - 1 VIEW COMPARISON:  04/02/2023 FINDINGS: Frontal view of the lower chest and upper abdomen demonstrates enteric catheter passing below diaphragm, tip coiled over the gastric fundus. Distended small bowel unchanged. Lung bases are clear. IMPRESSION: 1. Enteric catheter tip coiled over the gastric fundus. 2. Persistent small bowel dilatation, consistent with small-bowel obstruction. Electronically Signed   By: Sharlet Salina M.D.   On: 04/03/2023 23:28   CT ABDOMEN PELVIS WO CONTRAST Result Date: 04/02/2023 CLINICAL DATA:  Right lower quadrant abdominal pain with nausea, bilious vomiting and diarrhea for the past 4 days. EXAM: CT ABDOMEN AND PELVIS WITHOUT CONTRAST TECHNIQUE: Multidetector CT imaging of the abdomen and pelvis was performed following the standard protocol without IV contrast. RADIATION DOSE REDUCTION: This exam was performed according to the departmental dose-optimization program which includes automated exposure control, adjustment of the mA and/or kV according to patient size and/or use of iterative reconstruction technique. COMPARISON:  Chest radiographs dated 03/06/2021 FINDINGS: Lower chest: Normal sized heart with intracardiac pacer and AICD leads. Small right pleural effusion minimal right lower lobe atelectasis. Hepatobiliary: Mild sludge or noncalcified gallstones in the dependent portion of the gallbladder. No gallbladder wall thickening or pericholecystic fluid. Normal-appearing liver.  Pancreas: Unremarkable. No pancreatic ductal dilatation or surrounding inflammatory changes. Spleen: Normal in size without focal abnormality. Adrenals/Urinary Tract: Minimal urine in the urinary bladder. Normal-appearing adrenal glands, kidneys and ureters. Stomach/Bowel: Normal-appearing appendix containing a small appendicolith. Small hiatal hernia. Unremarkable colon. Multiple dilated, fluid-filled loops of small bowel with an abrupt transition to normal caliber terminal ileum in the right lower abdomen with no visible obstructing mass. Vascular/Lymphatic: No significant vascular findings are present. No enlarged abdominal or pelvic lymph nodes. Reproductive: Uterus and bilateral adnexa are unremarkable. Bilateral tubal ligation clips. Other: Small to moderate amount of free peritoneal fluid in the inferior, posterior pelvis. Small right inguinal hernia containing fat. Musculoskeletal: Mild-to-moderate bilateral hip degenerative changes. Lumbar and lower thoracic spine degenerative changes. These include facet degenerative changes with associated minimal anterolisthesis at the L4-5 level and grade 1 retrolisthesis at the L5-S1 level there is also moderate to marked foraminal stenosis on the right at the L5-S1 level. IMPRESSION: 1. Moderate partial small bowel obstruction with an abrupt transition to normal caliber terminal ileum in the right lower abdomen. This may be due to an adhesion. 2. Small to moderate amount of free peritoneal fluid in the inferior, posterior pelvis. 3. Small right pleural effusion with minimal right lower lobe atelectasis. 4. Mild sludge or noncalcified gallstones in the dependent portion of the gallbladder. 5. Small hiatal hernia. Electronically Signed   By: Beckie Salts M.D.   On: 04/02/2023 15:13       Fiorela Pelzer M.D. [facility] 04/04/2023, 9:52 AM  Available via Epic secure chat 7am-7pm After 7 pm, please refer to night coverage provider listed on amion.

## 2023-04-04 NOTE — Progress Notes (Signed)
On call hospitalist notified of KUB results, no new orders at this time.

## 2023-04-04 NOTE — TOC CM/SW Note (Signed)
Transition of Care [facility]) - Inpatient Brief Assessment   Patient Details  Name: Audrey Ruiz MRN: [id number] Date of Birth: [birth year]  Transition of Care [facility]) CM/SW Contact:    Harriet Masson, RN Phone Number: 04/04/2023, 12:31 PM   Clinical Narrative:   Patient admitted for SBO.  Transition of Care Department [facility]) has reviewed patient and no TOC needs have been identified at this time. We will continue to monitor patient advancement through interdisciplinary progression rounds. If new patient transition needs arise, please place a TOC consult. Transition of Care Asessment: Insurance and Status: Insurance coverage has been reviewed Patient has primary care physician: Yes Home environment has been reviewed: safe to discharge home when medically stable Prior level of function:: independent Prior/Current Home Services: No current home services Social Drivers of Health Review: SDOH reviewed no interventions necessary   Transition of care needs: no transition of care needs at this time

## 2023-04-04 NOTE — Progress Notes (Signed)
Subjective/Chief Complaint: Pt with some flatus No BMs KUB pending this AM   Objective: Vital signs in last 24 hours: Temp:  [98 F (36.7 C)-98.7 F (37.1 C)] 98.6 F (37 C) (02/14 0732) Pulse Rate:  [82-97] 94 (02/14 0732) Resp:  [16-20] 16 (02/14 0732) BP: (130-165)/(72-93) 152/73 (02/14 0732) SpO2:  [96 %-100 %] 99 % (02/14 0732) Weight:  [58.5 kg] 58.5 kg (02/13 1348) Last BM Date : 04/03/23  Intake/Output from previous day: 02/13 0701 - 02/14 0700 In: -  Out: 2050 [Emesis/NG output:2050] Intake/Output this shift: No intake/output data recorded.  PE:  Constitutional: No acute distress, conversant, appears states age. Eyes: Anicteric sclerae, moist conjunctiva, no lid lag Lungs: Clear to auscultation bilaterally, normal respiratory effort CV: regular rate and rhythm, no murmurs, no peripheral edema, pedal pulses 2+ GI: Soft, no masses or hepatosplenomegaly, min-tender to palpation gen Skin: No rashes, palpation reveals normal turgor Psychiatric: appropriate judgment and insight, oriented to person, place, and time   Lab Results:  Recent Labs    04/03/23 1425 04/04/23 0455  WBC 7.7 7.0  HGB 13.7 12.7  HCT 41.0 38.2  PLT 271 254   BMET Recent Labs    04/03/23 1425 04/04/23 0455  NA 130* 134*  K 3.6 3.6  CL 96* 98  CO2 21* 21*  GLUCOSE 81 85  BUN 38* 38*  CREATININE 1.10* 1.24*  CALCIUM 10.3 10.0   PT/INR No results for input(s): "LABPROT", "INR" in the last 72 hours. ABG No results for input(s): "PHART", "HCO3" in the last 72 hours.  Invalid input(s): "PCO2", "PO2"  Studies/Results: DG Abd 1 View Result Date: 04/03/2023 CLINICAL DATA:  Enteric catheter placement EXAM: ABDOMEN - 1 VIEW COMPARISON:  04/02/2023 FINDINGS: Frontal view of the lower chest and upper abdomen demonstrates enteric catheter passing below diaphragm, tip coiled over the gastric fundus. Distended small bowel unchanged. Lung bases are clear. IMPRESSION: 1. Enteric  catheter tip coiled over the gastric fundus. 2. Persistent small bowel dilatation, consistent with small-bowel obstruction. Electronically Signed   By: Sharlet Salina M.D.   On: 04/03/2023 23:28   CT ABDOMEN PELVIS WO CONTRAST Result Date: 04/02/2023 CLINICAL DATA:  Right lower quadrant abdominal pain with nausea, bilious vomiting and diarrhea for the past 4 days. EXAM: CT ABDOMEN AND PELVIS WITHOUT CONTRAST TECHNIQUE: Multidetector CT imaging of the abdomen and pelvis was performed following the standard protocol without IV contrast. RADIATION DOSE REDUCTION: This exam was performed according to the departmental dose-optimization program which includes automated exposure control, adjustment of the mA and/or kV according to patient size and/or use of iterative reconstruction technique. COMPARISON:  Chest radiographs dated 03/06/2021 FINDINGS: Lower chest: Normal sized heart with intracardiac pacer and AICD leads. Small right pleural effusion minimal right lower lobe atelectasis. Hepatobiliary: Mild sludge or noncalcified gallstones in the dependent portion of the gallbladder. No gallbladder wall thickening or pericholecystic fluid. Normal-appearing liver. Pancreas: Unremarkable. No pancreatic ductal dilatation or surrounding inflammatory changes. Spleen: Normal in size without focal abnormality. Adrenals/Urinary Tract: Minimal urine in the urinary bladder. Normal-appearing adrenal glands, kidneys and ureters. Stomach/Bowel: Normal-appearing appendix containing a small appendicolith. Small hiatal hernia. Unremarkable colon. Multiple dilated, fluid-filled loops of small bowel with an abrupt transition to normal caliber terminal ileum in the right lower abdomen with no visible obstructing mass. Vascular/Lymphatic: No significant vascular findings are present. No enlarged abdominal or pelvic lymph nodes. Reproductive: Uterus and bilateral adnexa are unremarkable. Bilateral tubal ligation clips. Other: Small to  moderate amount of free  peritoneal fluid in the inferior, posterior pelvis. Small right inguinal hernia containing fat. Musculoskeletal: Mild-to-moderate bilateral hip degenerative changes. Lumbar and lower thoracic spine degenerative changes. These include facet degenerative changes with associated minimal anterolisthesis at the L4-5 level and grade 1 retrolisthesis at the L5-S1 level there is also moderate to marked foraminal stenosis on the right at the L5-S1 level. IMPRESSION: 1. Moderate partial small bowel obstruction with an abrupt transition to normal caliber terminal ileum in the right lower abdomen. This may be due to an adhesion. 2. Small to moderate amount of free peritoneal fluid in the inferior, posterior pelvis. 3. Small right pleural effusion with minimal right lower lobe atelectasis. 4. Mild sludge or noncalcified gallstones in the dependent portion of the gallbladder. 5. Small hiatal hernia. Electronically Signed   By: Beckie Salts M.D.   On: 04/02/2023 15:13    Anti-infectives: Anti-infectives (From admission, onward)    None       Assessment/Plan: Gastroenteritis vs psbo -awaiting KUB this AM -Con't NGT for now if contrast in colon OK to DC and start clears     FEN - NPO/IVFs/few ice chips/sips with meds VTE - ok from our standpoint ID - none currently needed from an abdominal standpoint   LOS: 0 days    Axel Filler 04/04/2023

## 2023-04-05 ENCOUNTER — Inpatient Hospital Stay (HOSPITAL_COMMUNITY): Payer: PPO

## 2023-04-05 DIAGNOSIS — K56609 Unspecified intestinal obstruction, unspecified as to partial versus complete obstruction: Secondary | ICD-10-CM | POA: Diagnosis not present

## 2023-04-05 DIAGNOSIS — E119 Type 2 diabetes mellitus without complications: Secondary | ICD-10-CM | POA: Diagnosis not present

## 2023-04-05 DIAGNOSIS — I5022 Chronic systolic (congestive) heart failure: Secondary | ICD-10-CM | POA: Diagnosis not present

## 2023-04-05 DIAGNOSIS — K2100 Gastro-esophageal reflux disease with esophagitis, without bleeding: Secondary | ICD-10-CM | POA: Diagnosis not present

## 2023-04-05 LAB — BASIC METABOLIC PANEL
Anion gap: 10 (ref 5–15)
Anion gap: 10 (ref 5–15)
BUN: 32 mg/dL — ABNORMAL HIGH (ref 8–23)
BUN: 17 mg/dL (ref 8–23)
CO2: 22 mmol/L (ref 22–32)
CO2: 21 mmol/L — ABNORMAL LOW (ref 22–32)
Calcium: 9.2 mg/dL (ref 8.9–10.3)
Calcium: 9.5 mg/dL (ref 8.9–10.3)
Chloride: 102 mmol/L (ref 98–111)
Chloride: 100 mmol/L (ref 98–111)
Creatinine, Ser: 0.93 mg/dL (ref 0.44–1.00)
Creatinine, Ser: 0.73 mg/dL (ref 0.44–1.00)
GFR, Estimated: >60 mL/min (ref >60)
GFR, Estimated: >60 mL/min (ref >60)
Glucose, Bld: 132 mg/dL — ABNORMAL HIGH (ref 70–99)
Glucose, Bld: 119 mg/dL — ABNORMAL HIGH (ref 70–99)
Potassium: 2.7 mmol/L — CL (ref 3.5–5.1)
Potassium: 3.3 mmol/L — ABNORMAL LOW (ref 3.5–5.1)
Sodium: 134 mmol/L — ABNORMAL LOW (ref 135–145)
Sodium: 131 mmol/L — ABNORMAL LOW (ref 135–145)

## 2023-04-05 LAB — GLUCOSE, CAPILLARY
Glucose-Capillary: 108 mg/dL — ABNORMAL HIGH (ref 70–99)
Glucose-Capillary: 109 mg/dL — ABNORMAL HIGH (ref 70–99)
Glucose-Capillary: 115 mg/dL — ABNORMAL HIGH (ref 70–99)
Glucose-Capillary: 119 mg/dL — ABNORMAL HIGH (ref 70–99)
Glucose-Capillary: 125 mg/dL — ABNORMAL HIGH (ref 70–99)
Glucose-Capillary: 132 mg/dL — ABNORMAL HIGH (ref 70–99)

## 2023-04-05 LAB — MAGNESIUM: Magnesium: 1.9 mg/dL (ref 1.7–2.4)

## 2023-04-05 MED ORDER — INSULIN ASPART 100 UNIT/ML IJ SOLN: 0.0000 [IU] | Freq: Three times a day (TID) | INTRAMUSCULAR | Status: DC

## 2023-04-05 MED ORDER — POTASSIUM CHLORIDE 10 MEQ/100ML IV SOLN
10.0000 meq | INTRAVENOUS | Status: AC
  Administered 2023-04-05 (×6): 10 meq via INTRAVENOUS
  Filled 2023-04-05 (×5): qty 100

## 2023-04-05 NOTE — Progress Notes (Signed)
   Subjective/Chief Complaint: Pt doing well this AM BMx2 KU{B pending   Objective: Vital signs in last 24 hours: Temp:  [98.2 F (36.8 C)-98.6 F (37 C)] 98.4 F (36.9 C) (02/15 0738) Pulse Rate:  [88-92] 91 (02/15 0738) Resp:  [16-19] 16 (02/15 0738) BP: (142-156)/(72-81) 153/72 (02/15 0738) SpO2:  [98 %-99 %] 98 % (02/15 0738) Last BM Date : 04/04/23  Intake/Output from previous day: 02/14 0701 - 02/15 0700 In: 1000 [I.V.:1000] Out: 980 [Emesis/NG output:980] Intake/Output this shift: No intake/output data recorded.  PE:  Constitutional: No acute distress, conversant, appears states age. Eyes: Anicteric sclerae, moist conjunctiva, no lid lag Lungs: Clear to auscultation bilaterally, normal respiratory effort CV: regular rate and rhythm, no murmurs, no peripheral edema, pedal pulses 2+ GI: Soft, no masses or hepatosplenomegaly, non-tender to palpation Skin: No rashes, palpation reveals normal turgor Psychiatric: appropriate judgment and insight, oriented to person, place, and time   Lab Results:  Recent Labs    04/03/23 1425 04/04/23 0455  WBC 7.7 7.0  HGB 13.7 12.7  HCT 41.0 38.2  PLT 271 254   BMET Recent Labs    04/04/23 0455 04/05/23 0441  NA 134* 134*  K 3.6 2.7*  CL 98 102  CO2 21* 22  GLUCOSE 85 132*  BUN 38* 32*  CREATININE 1.24* 0.93  CALCIUM 10.0 9.2   PT/INR No results for input(s): "LABPROT", "INR" in the last 72 hours. ABG No results for input(s): "PHART", "HCO3" in the last 72 hours.  Invalid input(s): "PCO2", "PO2"  Studies/Results: DG Abd Portable 1V Result Date: 04/04/2023 CLINICAL DATA:  Small-bowel obstruction EXAM: PORTABLE ABDOMEN - 1 VIEW COMPARISON:  X-ray 04/03/2023.  CT 04/02/2023. FINDINGS: Enteric tube overlying the stomach. Gas is seen in nondilated loops of large bowel including air towards the rectum. There are some dilated air-filled loops of small bowel left mid abdomen approaching up to 4.2 cm, similar to the  prior CT. Again please correlate for obstruction. No obvious free air on these portable supine radiographs. Curvature of the spine with moderate degenerative changes. Degenerative changes of the pelvis. IMPRESSION: Persistent dilated air-filled loops small bowel. There is air in nondilated colon. Again please correlate for described small-bowel obstruction, partial. Electronically Signed   By: Karen Kays M.D.   On: 04/04/2023 13:15   DG Abd 1 View Result Date: 04/03/2023 CLINICAL DATA:  Enteric catheter placement EXAM: ABDOMEN - 1 VIEW COMPARISON:  04/02/2023 FINDINGS: Frontal view of the lower chest and upper abdomen demonstrates enteric catheter passing below diaphragm, tip coiled over the gastric fundus. Distended small bowel unchanged. Lung bases are clear. IMPRESSION: 1. Enteric catheter tip coiled over the gastric fundus. 2. Persistent small bowel dilatation, consistent with small-bowel obstruction. Electronically Signed   By: Sharlet Salina M.D.   On: 04/03/2023 23:28    Assessment/Plan: Gastroenteritis vs psbo -awaiting KUB this AM -clamp NGT; if no n/v x 4hr OK to DC   FEN - Clears OK if NGT comes out/IVFs/few ice chips/sips with meds VTE - ok from our standpoint ID - none currently needed from an abdominal standpoint  LOS: 1 day    Axel Filler 04/05/2023

## 2023-04-05 NOTE — Progress Notes (Addendum)
[facility]                                                                              Audrey Ruiz, is a 73 y.o. female, DOB - September 03, 1950, [id number] Admit date - 04/03/2023    Outpatient Primary MD for the patient is Cox, Kirsten, MD  LOS - 1  days  Chief Complaint  Patient presents with   Nausea   Emesis   Diarrhea       Brief summary   Patient is a 73 year old female with DM type II, HTN, HLP, nonischemic cardiomyopathy status post AICD, history of breast CA, hypothyroidism, GERD, anxiety lives at home and takes care of her husband who has dementia.  Patient reported nausea vomiting diarrhea that started 5 days ago.  Symptoms have been persistent and she has not been able to keep anything down.  Last BM on the morning of admission and seems to have slowed down since then. CT abdomen showed moderate partial SBO with abrupt transition to normal caliber terminal ileum in the right lower abdomen, may be due to an adhesion, small to moderate amount of free peritoneal fluid in the inferior posterior pelvis. General surgery was consulted and was admitted for further workup.  Assessment & Plan    Principal Problem: Partial SBO (small bowel obstruction) (HCC), gastroenteritis -Initially symptoms started as gastroenteritis 5 days prior to admission, imaging now concerning for partial small bowel obstruction due to adhesions -States had 2 BMs yesterday, passing flatus -NGT was clamped this morning, started on clears -General Surgery following  Active Problems: Hyponatremia, hypovolemic -Sodium 130 on admission, likely due to diarrhea, poor p.o. intake -Sodium improving, 134.  Continue IVF  Mild AKI -Baseline creatinine 0.6-0.7 --Continue to hold Entresto -Creatinine improving, close to baseline,    Gastroesophageal reflux disease -Continue PPI     Hyperlipidemia, Nonischemic cardiomyopathy (HCC), AICD -Currently Zetia, Crestor on hold, will resume p.o.  meds once consistently eating diet      Chronic systolic congestive heart failure (HCC) -Prior echo 05/2021 had shown EF of 50 to 55%, G1 DD -Currently euvolemic, no acute issues, hold Entresto    Controlled type 2 diabetes mellitus without complication, without long-term current use of insulin (HCC) Continue sliding scale insulin while inpatient, currently every 4 hours as n.p.o. CBG (last 3)  Recent Labs    04/05/23 0017 04/05/23 0509 04/05/23 0740  GLUCAP 125* 132* 108*  -Started on diet, will place on SSI before every meal and at bedtime   Hypothyroidism -Continue Synthroid  History of breast CA -Per oncology (at [facility]), history of stage IIa (T3 N0 M0) hormone positive breast ca s/p lumpectomy in 12/2008, followed by adjuvant chemotherapy and radiation. She also took anastrozole for 6+ years for her adjuvant endocrine management. Mammograms since then have shown no evidence of disease recurrence.    Estimated body mass index is 20.51 kg/m as calculated from the following:   Height as of this encounter: 5' 6.5" (1.689 m).   Weight as of this encounter: 58.5 kg.  Code Status: Full CODE STATUS DVT Prophylaxis:  enoxaparin (LOVENOX) injection 40 mg Start: 04/03/23 1700  Level of Care: Level of care: Med-Surg Family Communication: Updated patient's sister at the bedside Disposition Plan:      Remains inpatient appropriate:   Hopefully DC home tomorrow if tolerating solid diet   Procedures:    Consultants:   General surgery  Antimicrobials:   Anti-infectives (From admission, onward)    None          Medications  enoxaparin (LOVENOX) injection  40 mg Subcutaneous Q24H   fentaNYL (SUBLIMAZE) injection  12.5 mcg Intravenous Once   insulin aspart  0-9 Units Subcutaneous Q4H   pantoprazole (PROTONIX) IV  40 mg Intravenous QAC breakfast      Subjective:   Audrey Ruiz was seen and examined today.  States had 2 BM yesterday, passing flatus, feeling a  lot better.  NGT clamped.  No fevers chills, nausea vomiting or abdominal pain. Objective:   Vitals:   04/04/23 1555 04/04/23 2044 04/05/23 0508 04/05/23 0738  BP: (!) 149/77 (!) 156/81 (!) 142/73 (!) 153/72  Pulse: 92 91 88 91  Resp: 16 19 18 16   Temp: 98.6 F (37 C) 98.6 F (37 C) 98.2 F (36.8 C) 98.4 F (36.9 C)  TempSrc: Oral Oral Oral Oral  SpO2: 98% 99% 98% 98%  Weight:      Height:        Intake/Output Summary (Last 24 hours) at 04/05/2023 1142 Last data filed at 04/05/2023 1000 Gross per 24 hour  Intake 1000 ml  Output 980 ml  Net 20 ml     Wt Readings from Last 3 Encounters:  04/03/23 58.5 kg  04/01/23 60.7 kg  03/07/23 63 kg   Physical Exam General: Alert and oriented x 3, NAD, NGT+ Cardiovascular: S1 S2 clear, RRR.  Respiratory: CTAB, no wheezing Gastrointestinal: Soft, nontender, nondistended, NBS Ext: no pedal edema bilaterally Neuro: no new deficits Psych: Normal affect     Data Reviewed:  I have personally reviewed following labs    CBC Lab Results  Component Value Date   WBC 7.0 04/04/2023   RBC 4.09 04/04/2023   HGB 12.7 04/04/2023   HCT 38.2 04/04/2023   MCV 93.4 04/04/2023   MCH 31.1 04/04/2023   PLT 254 04/04/2023   MCHC 33.2 04/04/2023   RDW 12.5 04/04/2023   LYMPHSABS 1.7 04/03/2023   MONOABS 0.6 04/03/2023   EOSABS 0.1 04/03/2023   BASOSABS 0.1 04/03/2023     Last metabolic panel Lab Results  Component Value Date   NA 134 (L) 04/05/2023   K 2.7 (LL) 04/05/2023   CL 102 04/05/2023   CO2 22 04/05/2023   BUN 32 (H) 04/05/2023   CREATININE 0.93 04/05/2023   GLUCOSE 132 (H) 04/05/2023   GFRNONAA >60 04/05/2023   GFRAA 104 10/19/2019   CALCIUM 9.2 04/05/2023   PROT 7.0 04/03/2023   ALBUMIN 4.0 04/03/2023   LABGLOB 2.0 12/30/2022   AGRATIO 2.0 07/05/2022   BILITOT 2.1 (H) 04/03/2023   ALKPHOS 45 04/03/2023   AST 25 04/03/2023   ALT 16 04/03/2023   ANIONGAP 10 04/05/2023    CBG (last 3)  Recent Labs     04/05/23 0017 04/05/23 0509 04/05/23 0740  GLUCAP 125* 132* 108*      Coagulation Profile: No results for input(s): "INR", "PROTIME" in the last 168 hours.   Radiology Studies: I have personally reviewed the imaging studies  DG Abd Portable 1V Result Date: 04/04/2023 CLINICAL DATA:  Small-bowel obstruction EXAM: PORTABLE ABDOMEN - 1 VIEW COMPARISON:  X-ray 04/03/2023.  CT 04/02/2023. FINDINGS:  Enteric tube overlying the stomach. Gas is seen in nondilated loops of large bowel including air towards the rectum. There are some dilated air-filled loops of small bowel left mid abdomen approaching up to 4.2 cm, similar to the prior CT. Again please correlate for obstruction. No obvious free air on these portable supine radiographs. Curvature of the spine with moderate degenerative changes. Degenerative changes of the pelvis. IMPRESSION: Persistent dilated air-filled loops small bowel. There is air in nondilated colon. Again please correlate for described small-bowel obstruction, partial. Electronically Signed   By: Karen Kays M.D.   On: 04/04/2023 13:15   DG Abd 1 View Result Date: 04/03/2023 CLINICAL DATA:  Enteric catheter placement EXAM: ABDOMEN - 1 VIEW COMPARISON:  04/02/2023 FINDINGS: Frontal view of the lower chest and upper abdomen demonstrates enteric catheter passing below diaphragm, tip coiled over the gastric fundus. Distended small bowel unchanged. Lung bases are clear. IMPRESSION: 1. Enteric catheter tip coiled over the gastric fundus. 2. Persistent small bowel dilatation, consistent with small-bowel obstruction. Electronically Signed   By: Sharlet Salina M.D.   On: 04/03/2023 23:28       Marliss Buttacavoli M.D. [facility] 04/05/2023, 11:42 AM  Available via Epic secure chat 7am-7pm After 7 pm, please refer to night coverage provider listed on amion.

## 2023-04-05 NOTE — Progress Notes (Addendum)
Dr. Derrell Lolling ordered to remove NGT after 4hr of clamping. Pt and sister were doubtful of the plan and asked if pt can be on clear liq while NGT is kept to see if pt is able to tolerate the diet, Dr. Derrell Lolling was informed via Amnion and agreed of the plan.  Dr. Azucena Cecil was notified about the plan but wanted to repeat the Abd Xray. Currently, pt is resting well, able to tolerate her clear liq diet with minimal heart burn but slowing resolving, no N/V or abdominal tenderness.   6pm: Dr. Azucena Cecil has reviewed the repeat Xray. MD is ok to remove the NGT. Pt and family wants to remove it after dinner. MD is aware of the plan.

## 2023-04-06 ENCOUNTER — Inpatient Hospital Stay (HOSPITAL_COMMUNITY): Payer: PPO

## 2023-04-06 DIAGNOSIS — K2100 Gastro-esophageal reflux disease with esophagitis, without bleeding: Secondary | ICD-10-CM | POA: Diagnosis not present

## 2023-04-06 DIAGNOSIS — E119 Type 2 diabetes mellitus without complications: Secondary | ICD-10-CM | POA: Diagnosis not present

## 2023-04-06 DIAGNOSIS — I5022 Chronic systolic (congestive) heart failure: Secondary | ICD-10-CM | POA: Diagnosis not present

## 2023-04-06 DIAGNOSIS — K56609 Unspecified intestinal obstruction, unspecified as to partial versus complete obstruction: Secondary | ICD-10-CM | POA: Diagnosis not present

## 2023-04-06 LAB — GASTROINTESTINAL PANEL BY PCR, STOOL (REPLACES STOOL CULTURE)

## 2023-04-06 LAB — BASIC METABOLIC PANEL
Anion gap: 13 (ref 5–15)
BUN: 13 mg/dL (ref 8–23)
CO2: 22 mmol/L (ref 22–32)
Calcium: 10.2 mg/dL (ref 8.9–10.3)
Chloride: 100 mmol/L (ref 98–111)
Creatinine, Ser: 0.73 mg/dL (ref 0.44–1.00)
GFR, Estimated: >60 mL/min (ref >60)
Glucose, Bld: 102 mg/dL — ABNORMAL HIGH (ref 70–99)
Potassium: 3.2 mmol/L — ABNORMAL LOW (ref 3.5–5.1)
Sodium: 135 mmol/L (ref 135–145)

## 2023-04-06 LAB — POTASSIUM: Potassium: 4.7 mmol/L (ref 3.5–5.1)

## 2023-04-06 LAB — GLUCOSE, CAPILLARY
Glucose-Capillary: 102 mg/dL — ABNORMAL HIGH (ref 70–99)
Glucose-Capillary: 124 mg/dL — ABNORMAL HIGH (ref 70–99)
Glucose-Capillary: 130 mg/dL — ABNORMAL HIGH (ref 70–99)
Glucose-Capillary: 92 mg/dL (ref 70–99)

## 2023-04-06 LAB — MAGNESIUM: Magnesium: 1.8 mg/dL (ref 1.7–2.4)

## 2023-04-06 MED ORDER — FLUOXETINE HCL 20 MG PO CAPS
40.0000 mg | ORAL_CAPSULE | Freq: Every day | ORAL | Status: DC
  Administered 2023-04-06 – 2023-04-07 (×2): 40 mg via ORAL
  Filled 2023-04-06 (×2): qty 2

## 2023-04-06 MED ORDER — POTASSIUM CHLORIDE CRYS ER 20 MEQ PO TBCR
40.0000 meq | EXTENDED_RELEASE_TABLET | ORAL | Status: AC
  Administered 2023-04-06 (×2): 40 meq via ORAL
  Filled 2023-04-06 (×2): qty 2

## 2023-04-06 MED ORDER — MELATONIN 5 MG PO TABS: 10.0000 mg | ORAL_TABLET | Freq: Every day | ORAL | Status: DC | PRN

## 2023-04-06 MED ORDER — ROSUVASTATIN CALCIUM 20 MG PO TABS
40.0000 mg | ORAL_TABLET | Freq: Every day | ORAL | Status: DC
  Administered 2023-04-06: 40 mg via ORAL
  Filled 2023-04-06: qty 2

## 2023-04-06 MED ORDER — EZETIMIBE 10 MG PO TABS
10.0000 mg | ORAL_TABLET | Freq: Every day | ORAL | Status: DC
  Administered 2023-04-06: 10 mg via ORAL
  Filled 2023-04-06: qty 1

## 2023-04-06 MED ORDER — ONDANSETRON 4 MG PO TBDP: 4.0000 mg | ORAL_TABLET | Freq: Three times a day (TID) | ORAL | 0 refills | Status: DC | PRN

## 2023-04-06 MED ORDER — SIMETHICONE 80 MG PO CHEW
80.0000 mg | CHEWABLE_TABLET | Freq: Once | ORAL | Status: AC
  Administered 2023-04-06: 80 mg via ORAL
  Filled 2023-04-06: qty 1

## 2023-04-06 MED ORDER — POLYETHYLENE GLYCOL 3350 17 G PO PACK: 17.0000 g | PACK | Freq: Every day | ORAL | 0 refills | Status: AC | PRN

## 2023-04-06 NOTE — Progress Notes (Signed)
Subjective/Chief Complaint: Pt doing well this AM NGT out Tol FLD Having bowel fxn   Objective: Vital signs in last 24 hours: Temp:  [97.7 F (36.5 C)-98.9 F (37.2 C)] 97.7 F (36.5 C) (02/16 0521) Pulse Rate:  [85-92] 87 (02/16 0521) Resp:  [16-18] 18 (02/16 0521) BP: (131-137)/(67-73) 131/68 (02/16 0521) SpO2:  [98 %-100 %] 98 % (02/16 0521) Last BM Date : 04/05/23  Intake/Output from previous day: 02/15 0701 - 02/16 0700 In: 750 [P.O.:250; IV Piggyback:500] Out: 100 [Emesis/NG output:100] Intake/Output this shift: No intake/output data recorded.  PE:  Constitutional: No acute distress, conversant, appears states age. Eyes: Anicteric sclerae, moist conjunctiva, no lid lag Lungs: Clear to auscultation bilaterally, normal respiratory effort CV: regular rate and rhythm, no murmurs, no peripheral edema, pedal pulses 2+ GI: Soft, no masses or hepatosplenomegaly, non-tender to palpation Skin: No rashes, palpation reveals normal turgor Psychiatric: appropriate judgment and insight, oriented to person, place, and time   Lab Results:  Recent Labs    04/03/23 1425 04/04/23 0455  WBC 7.7 7.0  HGB 13.7 12.7  HCT 41.0 38.2  PLT 271 254   BMET Recent Labs    04/05/23 2135 04/06/23 0320  NA 131* 135  K 3.3* 3.2*  CL 100 100  CO2 21* 22  GLUCOSE 119* 102*  BUN 17 13  CREATININE 0.73 0.73  CALCIUM 9.5 10.2   PT/INR No results for input(s): "LABPROT", "INR" in the last 72 hours. ABG No results for input(s): "PHART", "HCO3" in the last 72 hours.  Invalid input(s): "PCO2", "PO2"  Studies/Results: DG Abd 2 Views Result Date: 04/05/2023 CLINICAL DATA:  Pneumoperitoneum, small bowel obstruction. EXAM: ABDOMEN - 2 VIEW COMPARISON:  Abdominal radiograph performed the same day. FINDINGS: Multiple dilated gas-filled loops of small bowel are seen with air-fluid levels, suggestive of small-bowel obstruction. Enteric contrast is seen in the descending colon and likely  explains the pronounced appearance of the bowel wall in this area as described on prior radiograph. No definite pneumoperitoneum is seen on upright or decubitus views. An enteric tube tip terminates near the gastroesophageal junction. Degenerative changes are seen in the spine. IMPRESSION: 1. Findings suggestive of small-bowel obstruction. No definite pneumoperitoneum. 2. Enteric tube tip terminates near the gastroesophageal junction. Electronically Signed   By: Romona Curls M.D.   On: 04/05/2023 16:41   DG Abd Portable 1V Result Date: 04/05/2023 CLINICAL DATA:  [id number] SBO (small bowel obstruction) (HCC) [id number] EXAM: PORTABLE ABDOMEN - 1 VIEW COMPARISON:  April 04, 2023, April 03, 2023, April 02, 2023 FINDINGS: Persistent dilation of loops of small bowel in the mid abdomen measuring up to 3.7 cm. This is similar comparison most recent prior. Crisp appearance of the walls of several bowel loops in the LEFT hemiabdomen which could reflect underlying Rigler sign on the supine radiograph. Enteric tube side port projects over the distal esophagus. IMPRESSION: 1. Pronounced appearance of the wall of several loops of bowel in the LEFT hemiabdomen. In the absence of a history of contrast administration, this could reflect supine evidence of pneumoperitoneum. Recommend dedicated upright radiograph for improved evaluation 2. Persistent dilation of loops of small bowel in the mid abdomen measuring up to 3.7 cm. This is similar in comparison most recent prior. 3. Enteric tube side port projects over the distal esophagus. Recommend advancement. These results will be called to the ordering clinician or representative by the Radiologist Assistant, and communication documented in the PACS or [employer]. Electronically Signed   By: Judeth Cornfield  Peacock M.D.   On: 04/05/2023 12:16   DG Abd Portable 1V Result Date: 04/04/2023 CLINICAL DATA:  Small-bowel obstruction EXAM: PORTABLE ABDOMEN - 1 VIEW COMPARISON:  X-ray  04/03/2023.  CT 04/02/2023. FINDINGS: Enteric tube overlying the stomach. Gas is seen in nondilated loops of large bowel including air towards the rectum. There are some dilated air-filled loops of small bowel left mid abdomen approaching up to 4.2 cm, similar to the prior CT. Again please correlate for obstruction. No obvious free air on these portable supine radiographs. Curvature of the spine with moderate degenerative changes. Degenerative changes of the pelvis. IMPRESSION: Persistent dilated air-filled loops small bowel. There is air in nondilated colon. Again please correlate for described small-bowel obstruction, partial. Electronically Signed   By: Karen Kays M.D.   On: 04/04/2023 13:15       Assessment/Plan: Gastroenteritis vs psbo Looks well this AM and tol PO. ADAT today No plans for surgery   FEN - ADAT VTE - ok from our standpoint ID - none currently needed from an abdominal standpoint   Ok for home when OK with medical team Please call back with questions  LOS: 2 days    Axel Filler 04/06/2023

## 2023-04-06 NOTE — Progress Notes (Signed)
Removed NG tube @2130 . Denied any nausea, vomiting, diarrhea. Able to keep clear liquids down. Diet changed to full liq.

## 2023-04-06 NOTE — Progress Notes (Signed)
[facility]                                                                              Audrey Ruiz, is a 73 y.o. female, DOB - 01/13/1951, ZOX:[id number] Admit date - 04/03/2023    Outpatient Primary MD for the patient is Cox, Kirsten, MD  LOS - 2  days  Chief Complaint  Patient presents with   Nausea   Emesis   Diarrhea       Brief summary   Patient is a 73 year old female with DM type II, HTN, HLP, nonischemic cardiomyopathy status post AICD, history of breast CA, hypothyroidism, GERD, anxiety lives at home and takes care of her husband who has dementia.  Patient reported nausea vomiting diarrhea that started 5 days ago.  Symptoms have been persistent and she has not been able to keep anything down.  Last BM on the morning of admission and seems to have slowed down since then. CT abdomen showed moderate partial SBO with abrupt transition to normal caliber terminal ileum in the right lower abdomen, may be due to an adhesion, small to moderate amount of free peritoneal fluid in the inferior posterior pelvis. General surgery was consulted and was admitted for further workup.  Assessment & Plan    Principal Problem: Partial SBO (small bowel obstruction) (HCC), gastroenteritis -Initially symptoms started as gastroenteritis 5 days prior to admission, imaging now concerning for partial small bowel obstruction due to adhesions -NGT removed yesterday, was started on clears, tolerated -Having liquid stools, no abdominal pain nausea vomiting. -Started on full liquid diet this a.m., will advance as tolerated.    Active Problems: Hyponatremia, hypovolemic -Sodium 130 on admission, likely due to diarrhea, poor p.o. intake -Resolved  Mild AKI -Baseline creatinine 0.6-0.7 --Continue to hold Entresto -Creatinine improved, 0.7, at baseline    Gastroesophageal reflux disease -Continue PPI     Hyperlipidemia, Nonischemic cardiomyopathy (HCC), AICD -resume Zetia,  Crestor     Chronic systolic congestive heart failure (HCC) -Prior echo 05/2021 had shown EF of 50 to 55%, G1 DD -Currently euvolemic, no acute issues, hold Entresto    Controlled type 2 diabetes mellitus without complication, without long-term current use of insulin (HCC) Continue sliding scale insulin while inpatient, currently every 4 hours as n.p.o. CBG (last 3)  Recent Labs    04/05/23 2022 04/06/23 0807 04/06/23 1146  GLUCAP 119* 102* 124*  -Started on diet, will place on SSI before every meal and at bedtime   Hypothyroidism -Continue Synthroid  History of breast CA -Per oncology (at [facility]), history of stage IIa (T3 N0 M0) hormone positive breast ca s/p lumpectomy in 12/2008, followed by adjuvant chemotherapy and radiation. She also took anastrozole for 6+ years for her adjuvant endocrine management. Mammograms since then have shown no evidence of disease recurrence.    Estimated body mass index is 20.51 kg/m as calculated from the following:   Height as of this encounter: 5' 6.5" (1.689 m).   Weight as of this encounter: 58.5 kg.  Code Status: Full CODE STATUS DVT Prophylaxis:  enoxaparin (LOVENOX) injection 40 mg Start: 04/03/23 1700   Level of Care: Level of  care: Med-Surg Family Communication: Updated patient's sister at the bedside Disposition Plan:      Remains inpatient appropriate:     Procedures:    Consultants:   General surgery  Antimicrobials:   Anti-infectives (From admission, onward)    None          Medications  enoxaparin (LOVENOX) injection  40 mg Subcutaneous Q24H   fentaNYL (SUBLIMAZE) injection  12.5 mcg Intravenous Once   insulin aspart  0-9 Units Subcutaneous TID WC   pantoprazole (PROTONIX) IV  40 mg Intravenous QAC breakfast      Subjective:   Audrey Ruiz was seen and examined today.  Feeling better, NGT removed yesterday.  Tolerated clears, diet advanced to full liquids this morning.  Had loose BMs, no abdominal  pain nausea or vomiting. Objective:   Vitals:   04/05/23 2021 04/06/23 0521 04/06/23 0807 04/06/23 1146  BP: 137/67 131/68 130/84 121/67  Pulse: 92 87 96 86  Resp: 18 18 16 16   Temp: 98.9 F (37.2 C) 97.7 F (36.5 C) 97.7 F (36.5 C) 97.9 F (36.6 C)  TempSrc: Oral Oral Oral Oral  SpO2: 98% 98% 100% 97%  Weight:      Height:        Intake/Output Summary (Last 24 hours) at 04/06/2023 1158 Last data filed at 04/05/2023 1423 Gross per 24 hour  Intake 750 ml  Output --  Net 750 ml     Wt Readings from Last 3 Encounters:  04/03/23 58.5 kg  04/01/23 60.7 kg  03/07/23 63 kg    Physical Exam General: Alert and oriented x 3, NAD Cardiovascular: S1 S2 clear, RRR.  Respiratory: CTAB, no wheezing Gastrointestinal: Soft, nontender, nondistended, NBS Ext: no pedal edema bilaterally Neuro: no new deficits Psych: Normal affect    Data Reviewed:  I have personally reviewed following labs    CBC Lab Results  Component Value Date   WBC 7.0 04/04/2023   RBC 4.09 04/04/2023   HGB 12.7 04/04/2023   HCT 38.2 04/04/2023   MCV 93.4 04/04/2023   MCH 31.1 04/04/2023   PLT 254 04/04/2023   MCHC 33.2 04/04/2023   RDW 12.5 04/04/2023   LYMPHSABS 1.7 04/03/2023   MONOABS 0.6 04/03/2023   EOSABS 0.1 04/03/2023   BASOSABS 0.1 04/03/2023     Last metabolic panel Lab Results  Component Value Date   NA 135 04/06/2023   K 3.2 (L) 04/06/2023   CL 100 04/06/2023   CO2 22 04/06/2023   BUN 13 04/06/2023   CREATININE 0.73 04/06/2023   GLUCOSE 102 (H) 04/06/2023   GFRNONAA >60 04/06/2023   GFRAA 104 10/19/2019   CALCIUM 10.2 04/06/2023   PROT 7.0 04/03/2023   ALBUMIN 4.0 04/03/2023   LABGLOB 2.0 12/30/2022   AGRATIO 2.0 07/05/2022   BILITOT 2.1 (H) 04/03/2023   ALKPHOS 45 04/03/2023   AST 25 04/03/2023   ALT 16 04/03/2023   ANIONGAP 13 04/06/2023    CBG (last 3)  Recent Labs    04/05/23 2022 04/06/23 0807 04/06/23 1146  GLUCAP 119* 102* 124*      Coagulation  Profile: No results for input(s): "INR", "PROTIME" in the last 168 hours.   Radiology Studies: I have personally reviewed the imaging studies  DG Abd 2 Views Result Date: 04/05/2023 CLINICAL DATA:  Pneumoperitoneum, small bowel obstruction. EXAM: ABDOMEN - 2 VIEW COMPARISON:  Abdominal radiograph performed the same day. FINDINGS: Multiple dilated gas-filled loops of small bowel are seen with air-fluid levels, suggestive of small-bowel obstruction.  Enteric contrast is seen in the descending colon and likely explains the pronounced appearance of the bowel wall in this area as described on prior radiograph. No definite pneumoperitoneum is seen on upright or decubitus views. An enteric tube tip terminates near the gastroesophageal junction. Degenerative changes are seen in the spine. IMPRESSION: 1. Findings suggestive of small-bowel obstruction. No definite pneumoperitoneum. 2. Enteric tube tip terminates near the gastroesophageal junction. Electronically Signed   By: Romona Curls M.D.   On: 04/05/2023 16:41   DG Abd Portable 1V Result Date: 04/05/2023 CLINICAL DATA:  [id number] SBO (small bowel obstruction) (HCC) [id number] EXAM: PORTABLE ABDOMEN - 1 VIEW COMPARISON:  April 04, 2023, April 03, 2023, April 02, 2023 FINDINGS: Persistent dilation of loops of small bowel in the mid abdomen measuring up to 3.7 cm. This is similar comparison most recent prior. Crisp appearance of the walls of several bowel loops in the LEFT hemiabdomen which could reflect underlying Rigler sign on the supine radiograph. Enteric tube side port projects over the distal esophagus. IMPRESSION: 1. Pronounced appearance of the wall of several loops of bowel in the LEFT hemiabdomen. In the absence of a history of contrast administration, this could reflect supine evidence of pneumoperitoneum. Recommend dedicated upright radiograph for improved evaluation 2. Persistent dilation of loops of small bowel in the mid abdomen measuring up to  3.7 cm. This is similar in comparison most recent prior. 3. Enteric tube side port projects over the distal esophagus. Recommend advancement. These results will be called to the ordering clinician or representative by the Radiologist Assistant, and communication documented in the PACS or [employer]. Electronically Signed   By: Meda Klinefelter M.D.   On: 04/05/2023 12:16       Dakwon Wenberg M.D. [facility] 04/06/2023, 11:58 AM  Available via Epic secure chat 7am-7pm After 7 pm, please refer to night coverage provider listed on amion.

## 2023-04-06 NOTE — Plan of Care (Signed)

## 2023-04-07 ENCOUNTER — Inpatient Hospital Stay: Payer: PPO

## 2023-04-07 ENCOUNTER — Other Ambulatory Visit: Payer: Self-pay

## 2023-04-07 ENCOUNTER — Other Ambulatory Visit: Payer: Self-pay | Admitting: Family Medicine

## 2023-04-07 ENCOUNTER — Inpatient Hospital Stay: Payer: PPO | Admitting: Oncology

## 2023-04-07 DIAGNOSIS — E782 Mixed hyperlipidemia: Secondary | ICD-10-CM

## 2023-04-07 DIAGNOSIS — D649 Anemia, unspecified: Secondary | ICD-10-CM

## 2023-04-07 DIAGNOSIS — K56609 Unspecified intestinal obstruction, unspecified as to partial versus complete obstruction: Secondary | ICD-10-CM

## 2023-04-07 DIAGNOSIS — I5022 Chronic systolic (congestive) heart failure: Secondary | ICD-10-CM | POA: Diagnosis not present

## 2023-04-07 DIAGNOSIS — E119 Type 2 diabetes mellitus without complications: Secondary | ICD-10-CM | POA: Diagnosis not present

## 2023-04-07 LAB — BASIC METABOLIC PANEL
Anion gap: 7 (ref 5–15)
BUN: 12 mg/dL (ref 8–23)
CO2: 21 mmol/L — ABNORMAL LOW (ref 22–32)
Calcium: 10 mg/dL (ref 8.9–10.3)
Chloride: 106 mmol/L (ref 98–111)
Creatinine, Ser: 0.77 mg/dL (ref 0.44–1.00)
GFR, Estimated: >60 mL/min (ref >60)
Glucose, Bld: 108 mg/dL — ABNORMAL HIGH (ref 70–99)
Potassium: 4.5 mmol/L (ref 3.5–5.1)
Sodium: 134 mmol/L — ABNORMAL LOW (ref 135–145)

## 2023-04-07 LAB — GLUCOSE, CAPILLARY: Glucose-Capillary: 95 mg/dL (ref 70–99)

## 2023-04-07 MED ORDER — SIMETHICONE 80 MG PO CHEW
80.0000 mg | CHEWABLE_TABLET | Freq: Three times a day (TID) | ORAL | Status: DC
  Administered 2023-04-07: 80 mg via ORAL
  Filled 2023-04-07: qty 1

## 2023-04-07 NOTE — Discharge Summary (Signed)
Physician Discharge Summary   Patient: Audrey Ruiz MRN: [id number] DOB: Oct 21, 1950  Admit date:     04/03/2023  Discharge date: 04/07/23  Discharge Physician: Thad Ranger, MD    PCP: Blane Ohara, MD   Recommendations at discharge:   Continue stool softeners, MiraLAX as needed.  Avoid constipation. Soft solids  Discharge Diagnoses:    SBO (small bowel obstruction) (HCC) resolved   Gastroesophageal reflux disease   Hyperlipidemia   Nonischemic cardiomyopathy (HCC)   Chronic systolic congestive heart failure (HCC)   ICD (implantable cardioverter-defibrillator), biventricular, in situ   Controlled type 2 diabetes mellitus without complication, without long-term current use of insulin [facility])    Hospital Course:  Patient is a 73 year old female with DM type II, HTN, HLP, nonischemic cardiomyopathy status post AICD, history of breast CA, hypothyroidism, GERD, anxiety lives at home and takes care of her husband who has dementia.  Patient reported nausea vomiting diarrhea that started 5 days ago.  Symptoms have been persistent and she has not been able to keep anything down.  Last BM on the morning of admission and seems to have slowed down since then. CT abdomen showed moderate partial SBO with abrupt transition to normal caliber terminal ileum in the right lower abdomen, may be due to an adhesion, small to moderate amount of free peritoneal fluid in the inferior posterior pelvis. General surgery was consulted and was admitted for further workup.  Assessment and Plan:  Partial SBO (small bowel obstruction) (HCC), gastroenteritis -Initially symptoms started as gastroenteritis 5 days prior to admission, imaging then concerning for partial small bowel obstruction due to adhesions -General Surgery was consulted, patient was placed on conservative management with NGT, IV fluids, NPO.   -NGT removed on 2/16, patient was placed on clears and then advance diet as tolerated.  -She  has been having regular bowel movements, loose, passing gas.  She did have some abdominal distention overnight with gas and improved with simethicone.  X-ray likely with ileus but no small bowel obstruction. -Recommended patient to continue MiraLAX as needed, simethicone, soft diet and high-fiber diet/Metamucil or psyllium.  Avoid constipation.  -She is now on solid diet, had a BM this morning.  Hyponatremia, hypovolemic -Sodium 130 on admission, likely due to diarrhea, poor p.o. intake Resolved, sodium 134 at discharge   Mild AKI -Baseline creatinine 0.6-0.7, mildly elevated to 1.2 on 04/04/2023 -Creatinine improved, 0.7, at baseline     Gastroesophageal reflux disease -Continue PPI      Hyperlipidemia, Nonischemic cardiomyopathy (HCC), AICD -resume Zetia, Crestor       Chronic systolic congestive heart failure (HCC) -Prior echo 05/2021 had shown EF of 50 to 55%, G1 DD -Currently stable, resume outpatient medications, Entresto     Controlled type 2 diabetes mellitus without complication, without long-term current use of insulin (HCC) -Diet controlled, hemoglobin A1c was 5.6 on 12/30/22      Hypothyroidism -Continue Synthroid   History of breast CA -Per oncology (at [facility]), history of stage IIa (T3 N0 M0) hormone positive breast ca s/p lumpectomy in 12/2008, followed by adjuvant chemotherapy and radiation. She also took anastrozole for 6+ years for her adjuvant endocrine management. Mammograms since then have shown no evidence of disease recurrence.      Estimated body mass index is 20.51 kg/m as calculated from the following:   Height as of this encounter: 5' 6.5" (1.689 m).   Weight as of this encounter: 58.5 kg.       Pain control - [facility]  Controlled Substance Reporting System database was reviewed. and patient was instructed, not to drive, operate heavy machinery, perform activities at heights, swimming or participation in water activities or provide  baby-sitting services while on Pain, Sleep and Anxiety Medications; until their outpatient Physician has advised to do so again. Also recommended to not to take more than prescribed Pain, Sleep and Anxiety Medications.  Consultants: General Surgery Procedures performed:   Disposition: Home Diet recommendation:  Discharge Diet Orders (From admission, onward)     Start     Ordered   04/07/23 0000  Diet - low sodium heart healthy        04/07/23 1034            DISCHARGE MEDICATION: Allergies as of 04/07/2023   No Known Allergies      Medication List     TAKE these medications    cetirizine 10 MG tablet Commonly known as: Allergy Relief Cetirizine Take 1 tablet (10 mg total) by mouth at bedtime.   Entresto 24-26 MG Generic drug: sacubitril-valsartan TAKE 1 TABLET BY MOUTH IN THE MORNING AND 1 AT BEDTIME *NEW PRESCRIPTION REQUEST*   ezetimibe 10 MG tablet Commonly known as: ZETIA Take 1 tablet (10 mg total) by mouth at bedtime.   Fish Oil 1000 MG Caps Take 1,000 mg by mouth 2 (two) times daily.   FLUoxetine 40 MG capsule Commonly known as: PROZAC Take 1 capsule (40 mg total) by mouth daily.   fluticasone 50 MCG/ACT nasal spray Commonly known as: FLONASE USE 2 SPRAYS INTO EACH NOSTRIL ONCE DAILY *NEW PRESCRIPTION REQUEST*   Glucosamine-Chondroitin-MSM 1500-1200-500 MG Pack Take 1 tablet by mouth daily.   ibuprofen 200 MG tablet Commonly known as: ADVIL Take 200 mg by mouth every 8 (eight) hours as needed for moderate pain.   levothyroxine 25 MCG tablet Commonly known as: SYNTHROID Take 1 tablet (25 mcg total) by mouth daily before breakfast.   magic mouthwash (nystatin, lidocaine, diphenhydrAMINE, alum & mag hydroxide) suspension Swish and spit 5 mLs 2 (two) times daily as needed for mouth pain.   Melatonin 10 MG Caps Take 10 mg by mouth daily as needed (sleep).   multivitamin capsule Take 1 capsule by mouth daily.   nystatin 100000 UNIT/ML  suspension Commonly known as: MYCOSTATIN Take 5 mLs by mouth 4 (four) times daily.   omeprazole 20 MG capsule Commonly known as: PRILOSEC Take 1 capsule (20 mg total) by mouth daily.   ondansetron 4 MG disintegrating tablet Commonly known as: ZOFRAN-ODT Take 1 tablet (4 mg total) by mouth every 8 (eight) hours as needed for nausea or vomiting.   polyethylene glycol 17 g packet Commonly known as: MIRALAX / GLYCOLAX Take 17 g by mouth daily as needed for mild constipation. Available OTC.   rosuvastatin 40 MG tablet Commonly known as: CRESTOR TAKE 1 TABLET BY MOUTH EVERY DAY AT BEDTIME   Simethicone 250 MG Caps Take 250 mg by mouth in the morning and at bedtime.   Vitamin D (Cholecalciferol) 25 MCG (1000 UT) Caps Take 1,000 Units by mouth daily.        Follow-up Information     Cox, Kirsten, MD. Schedule an appointment as soon as possible for a visit in 2 week(s).   Specialty: Family Medicine Why: for hospital follow-up Contact information: [address] [place] [address] [phone]                Discharge Exam: Ceasar Mons Weights   04/03/23 1348  Weight:  58.5 kg   S: Overnight had "gas issues" improved with simethicone, tolerating solid diet, had a BM this morning as well.  Currently better, no nausea vomiting abdominal pain, chest pain or fevers.  BP (!) 106/56 (BP Location: Left Arm)   Pulse 67   Temp 98 F (36.7 C) (Oral)   Resp 17   Ht 5' 6.5" (1.689 m)   Wt 58.5 kg   SpO2 100%   BMI 20.51 kg/m    Physical Exam General: Alert and oriented x 3, NAD Cardiovascular: S1 S2 clear, RRR.  Respiratory: CTAB, no wheezing, rales or rhonchi Gastrointestinal: Soft, nontender, nondistended, NBS Ext: no pedal edema bilaterally Neuro: no new deficits Psych: Normal affect    Condition at discharge: fair  The results of significant diagnostics from this hospitalization (including imaging, microbiology, ancillary and laboratory) are listed  below for reference.   Imaging Studies: DG Abd 1 View Result Date: 04/06/2023 CLINICAL DATA:  Abdominal pain. EXAM: ABDOMEN - 1 VIEW COMPARISON:  Radiograph yesterday.  CT 04/02/2023 FINDINGS: Mild gaseous gastric distension. Persistent gaseous small bowel distension. No significant formed stool in the colon. No evidence of free intra-abdominal air. Stable pelvic phleboliths. IMPRESSION: Persistent gaseous small bowel distension, suspicious for small bowel obstruction. Electronically Signed   By: Narda Rutherford M.D.   On: 04/06/2023 22:16   DG Abd 2 Views Result Date: 04/05/2023 CLINICAL DATA:  Pneumoperitoneum, small bowel obstruction. EXAM: ABDOMEN - 2 VIEW COMPARISON:  Abdominal radiograph performed the same day. FINDINGS: Multiple dilated gas-filled loops of small bowel are seen with air-fluid levels, suggestive of small-bowel obstruction. Enteric contrast is seen in the descending colon and likely explains the pronounced appearance of the bowel wall in this area as described on prior radiograph. No definite pneumoperitoneum is seen on upright or decubitus views. An enteric tube tip terminates near the gastroesophageal junction. Degenerative changes are seen in the spine. IMPRESSION: 1. Findings suggestive of small-bowel obstruction. No definite pneumoperitoneum. 2. Enteric tube tip terminates near the gastroesophageal junction. Electronically Signed   By: Romona Curls M.D.   On: 04/05/2023 16:41   DG Abd Portable 1V Result Date: 04/05/2023 CLINICAL DATA:  [id number] SBO (small bowel obstruction) (HCC) [id number] EXAM: PORTABLE ABDOMEN - 1 VIEW COMPARISON:  April 04, 2023, April 03, 2023, April 02, 2023 FINDINGS: Persistent dilation of loops of small bowel in the mid abdomen measuring up to 3.7 cm. This is similar comparison most recent prior. Crisp appearance of the walls of several bowel loops in the LEFT hemiabdomen which could reflect underlying Rigler sign on the supine radiograph. Enteric  tube side port projects over the distal esophagus. IMPRESSION: 1. Pronounced appearance of the wall of several loops of bowel in the LEFT hemiabdomen. In the absence of a history of contrast administration, this could reflect supine evidence of pneumoperitoneum. Recommend dedicated upright radiograph for improved evaluation 2. Persistent dilation of loops of small bowel in the mid abdomen measuring up to 3.7 cm. This is similar in comparison most recent prior. 3. Enteric tube side port projects over the distal esophagus. Recommend advancement. These results will be called to the ordering clinician or representative by the Radiologist Assistant, and communication documented in the PACS or [employer]. Electronically Signed   By: Meda Klinefelter M.D.   On: 04/05/2023 12:16   DG Abd Portable 1V Result Date: 04/04/2023 CLINICAL DATA:  Small-bowel obstruction EXAM: PORTABLE ABDOMEN - 1 VIEW COMPARISON:  X-ray 04/03/2023.  CT 04/02/2023. FINDINGS: Enteric tube overlying the stomach. Gas  is seen in nondilated loops of large bowel including air towards the rectum. There are some dilated air-filled loops of small bowel left mid abdomen approaching up to 4.2 cm, similar to the prior CT. Again please correlate for obstruction. No obvious free air on these portable supine radiographs. Curvature of the spine with moderate degenerative changes. Degenerative changes of the pelvis. IMPRESSION: Persistent dilated air-filled loops small bowel. There is air in nondilated colon. Again please correlate for described small-bowel obstruction, partial. Electronically Signed   By: Karen Kays M.D.   On: 04/04/2023 13:15   DG Abd 1 View Result Date: 04/03/2023 CLINICAL DATA:  Enteric catheter placement EXAM: ABDOMEN - 1 VIEW COMPARISON:  04/02/2023 FINDINGS: Frontal view of the lower chest and upper abdomen demonstrates enteric catheter passing below diaphragm, tip coiled over the gastric fundus. Distended small bowel  unchanged. Lung bases are clear. IMPRESSION: 1. Enteric catheter tip coiled over the gastric fundus. 2. Persistent small bowel dilatation, consistent with small-bowel obstruction. Electronically Signed   By: Sharlet Salina M.D.   On: 04/03/2023 23:28   CT ABDOMEN PELVIS WO CONTRAST Result Date: 04/02/2023 CLINICAL DATA:  Right lower quadrant abdominal pain with nausea, bilious vomiting and diarrhea for the past 4 days. EXAM: CT ABDOMEN AND PELVIS WITHOUT CONTRAST TECHNIQUE: Multidetector CT imaging of the abdomen and pelvis was performed following the standard protocol without IV contrast. RADIATION DOSE REDUCTION: This exam was performed according to the departmental dose-optimization program which includes automated exposure control, adjustment of the mA and/or kV according to patient size and/or use of iterative reconstruction technique. COMPARISON:  Chest radiographs dated 03/06/2021 FINDINGS: Lower chest: Normal sized heart with intracardiac pacer and AICD leads. Small right pleural effusion minimal right lower lobe atelectasis. Hepatobiliary: Mild sludge or noncalcified gallstones in the dependent portion of the gallbladder. No gallbladder wall thickening or pericholecystic fluid. Normal-appearing liver. Pancreas: Unremarkable. No pancreatic ductal dilatation or surrounding inflammatory changes. Spleen: Normal in size without focal abnormality. Adrenals/Urinary Tract: Minimal urine in the urinary bladder. Normal-appearing adrenal glands, kidneys and ureters. Stomach/Bowel: Normal-appearing appendix containing a small appendicolith. Small hiatal hernia. Unremarkable colon. Multiple dilated, fluid-filled loops of small bowel with an abrupt transition to normal caliber terminal ileum in the right lower abdomen with no visible obstructing mass. Vascular/Lymphatic: No significant vascular findings are present. No enlarged abdominal or pelvic lymph nodes. Reproductive: Uterus and bilateral adnexa are  unremarkable. Bilateral tubal ligation clips. Other: Small to moderate amount of free peritoneal fluid in the inferior, posterior pelvis. Small right inguinal hernia containing fat. Musculoskeletal: Mild-to-moderate bilateral hip degenerative changes. Lumbar and lower thoracic spine degenerative changes. These include facet degenerative changes with associated minimal anterolisthesis at the L4-5 level and grade 1 retrolisthesis at the L5-S1 level there is also moderate to marked foraminal stenosis on the right at the L5-S1 level. IMPRESSION: 1. Moderate partial small bowel obstruction with an abrupt transition to normal caliber terminal ileum in the right lower abdomen. This may be due to an adhesion. 2. Small to moderate amount of free peritoneal fluid in the inferior, posterior pelvis. 3. Small right pleural effusion with minimal right lower lobe atelectasis. 4. Mild sludge or noncalcified gallstones in the dependent portion of the gallbladder. 5. Small hiatal hernia. Electronically Signed   By: Beckie Salts M.D.   On: 04/02/2023 15:13    Microbiology: Results for orders placed or performed during the hospital encounter of 04/03/23  Gastrointestinal Panel by PCR , Stool     Status: None  Collection Time: 04/03/23  3:08 PM   Specimen: Stool  Result Value Ref Range Status   Campylobacter species NOT DETECTED NOT DETECTED Final   Plesimonas shigelloides NOT DETECTED NOT DETECTED Final   Salmonella species NOT DETECTED NOT DETECTED Final   Yersinia enterocolitica NOT DETECTED NOT DETECTED Final   Vibrio species NOT DETECTED NOT DETECTED Final   Vibrio cholerae NOT DETECTED NOT DETECTED Final   Enteroaggregative E coli (EAEC) NOT DETECTED NOT DETECTED Final   Enteropathogenic E coli (EPEC) NOT DETECTED NOT DETECTED Final   Enterotoxigenic E coli (ETEC) NOT DETECTED NOT DETECTED Final   Shiga like toxin producing E coli (STEC) NOT DETECTED NOT DETECTED Final   Shigella/Enteroinvasive E coli (EIEC)  NOT DETECTED NOT DETECTED Final   Cryptosporidium NOT DETECTED NOT DETECTED Final   Cyclospora cayetanensis NOT DETECTED NOT DETECTED Final   Entamoeba histolytica NOT DETECTED NOT DETECTED Final   Giardia lamblia NOT DETECTED NOT DETECTED Final   Adenovirus F40/41 NOT DETECTED NOT DETECTED Final   Astrovirus NOT DETECTED NOT DETECTED Final   Norovirus GI/GII NOT DETECTED NOT DETECTED Final   Rotavirus A NOT DETECTED NOT DETECTED Final   Sapovirus (I, II, IV, and V) NOT DETECTED NOT DETECTED Final    Comment: Performed at [facility], [address] [place], [place] [address]    Labs: CBC: Recent Labs  Lab 04/03/23 1425 04/04/23 0455  WBC 7.7 7.0  NEUTROABS 5.2  --   HGB 13.7 12.7  HCT 41.0 38.2  MCV 92.1 93.4  PLT 271 254   Basic Metabolic Panel: Recent Labs  Lab 04/04/23 0455 04/05/23 0441 04/05/23 2135 04/06/23 0320 04/06/23 1759 04/07/23 0437  NA 134* 134* 131* 135  --  134*  K 3.6 2.7* 3.3* 3.2* 4.7 4.5  CL 98 102 100 100  --  106  CO2 21* 22 21* 22  --  21*  GLUCOSE 85 132* 119* 102*  --  108*  BUN 38* 32* 17 13  --  12  CREATININE 1.24* 0.93 0.73 0.73  --  0.77  CALCIUM 10.0 9.2 9.5 10.2  --  10.0  MG  --  1.9  --  1.8  --   --    Liver Function Tests: Recent Labs  Lab 04/03/23 1425  AST 25  ALT 16  ALKPHOS 45  BILITOT 2.1*  PROT 7.0  ALBUMIN 4.0   CBG: Recent Labs  Lab 04/06/23 0807 04/06/23 1146 04/06/23 1538 04/06/23 2039 04/07/23 0742  GLUCAP 102* 124* 130* 92 95    Discharge time spent: greater than 30 minutes.  Signed: Thad Ranger, MD [facility] 04/07/2023

## 2023-04-07 NOTE — Plan of Care (Signed)

## 2023-04-07 NOTE — Progress Notes (Signed)
Pt complained of feeling bloated. Pt is now distended. Pt stated been bloated/distended since afternoon after finishing lunch. Currently passing gas. Denied nausea or any pain. Pt did state relief when passes gas. Pt requested med for gas. Pt and pt's family member requested another scan of abdomen because of previous symptoms (bloating, distention, loose stools) now coming back. Dr. Truitt Merle. Simethicone ordered and given. STAT XR ordered and done.

## 2023-04-07 NOTE — TOC Transition Note (Signed)
Transition of Care [facility]) - Discharge Note   Patient Details  Name: Audrey Ruiz MRN: [id number] Date of Birth: [birth year]  [facility] CM/SW Contact:  Harriet Masson, RN Phone Number: 04/07/2023, 12:21 PM   Clinical Narrative:    Patient stable to discharge home.  No TOC needs at this time.    Final next level of care: Home/Self Care Barriers to Discharge: Barriers Resolved   Patient Goals and CMS Choice Patient states their goals for this hospitalization and ongoing recovery are:: return home          Discharge Placement                 home      Discharge Plan and Services Additional resources added to the After Visit Summary for                                       Social Drivers of Health (SDOH) Interventions SDOH Screenings   Food Insecurity: No Food Insecurity (04/03/2023)  Housing: Low Risk  (04/03/2023)  Transportation Needs: No Transportation Needs (04/03/2023)  Utilities: Not At Risk (04/03/2023)  Alcohol Screen: Low Risk  (11/04/2022)  Depression (PHQ2-9): Low Risk  (02/27/2023)  Financial Resource Strain: Low Risk  (11/04/2022)  Physical Activity: Sufficiently Active (11/04/2022)  Social Connections: Socially Integrated (04/03/2023)  Stress: No Stress Concern Present (11/04/2022)  Tobacco Use: Low Risk  (04/03/2023)  Health Literacy: Adequate Health Literacy (11/04/2022)     Readmission Risk Interventions    04/07/2023   12:21 PM  Readmission Risk Prevention Plan  Post Dischage Appt Complete  Medication Screening Complete  Transportation Screening Complete

## 2023-04-08 ENCOUNTER — Telehealth: Payer: Self-pay

## 2023-04-08 NOTE — Transitions of Care (Post Inpatient/ED Visit) (Signed)
   04/08/2023  Name: Audrey Ruiz MRN: [id number] DOB: 05-Oct-1950  Today's TOC FU Call Status: Today's TOC FU Call Status:: Unsuccessful Call (2nd Attempt) Unsuccessful Call (2nd Attempt) Date: 04/08/23  Attempted to reach the patient regarding the most recent Inpatient/ED visit. RNCM attempted to reach patient to complete the [facility] call and to notify patient of response from Dr Sedalia Muta advising patient does not need to supplement potassium and reinforce follow up appointment is 04/10/23 at 11am and patient does not need to fast. Left message for patient to return RNCM's call.   Follow Up Plan: Additional outreach attempts will be made to reach the patient to complete the Transitions of Care (Post Inpatient/ED visit) call.   Hilbert Odor RN, CCM [place]  VBCI-Population Health RN Care Manager [phone]

## 2023-04-08 NOTE — Transitions of Care (Post Inpatient/ED Visit) (Signed)
   04/08/2023  Name: Audrey Ruiz MRN: [id number] DOB: Jun 15, 1950  Today's TOC FU Call Status: Today's TOC FU Call Status:: Successful TOC FU Call Completed TOC FU Call Complete Date: 04/08/23 Patient's Name and Date of Birth confirmed.  Transition Care Management Follow-up Telephone Call Date of Discharge: 04/07/23 Discharge Facility: [facility]) Type of Discharge: Inpatient Admission Primary Inpatient Discharge Diagnosis:: Small Bowel Obstruction How have you been since you were released from the hospital?: Better Any questions or concerns?: Yes Patient Questions/Concerns:: Patient had numerous questions about her Potassium levels and she thought she was going to discharge on Potassium. Patient Questions/Concerns Addressed: Notified Provider of Patient Questions/Concerns (Message sent to Dr. Sedalia Muta to address patients Potassium level which was 4.7 the day of discharge. Patient wanted to know about over the counter Potassium and if she should purchase some.  Advised patient against it until she hears back from [place].)  Items Reviewed: Did you receive and understand the discharge instructions provided?: Yes Medications obtained,verified, and reconciled?:  (Did not complete full TOC, to be done after response from PCP)  Medications Reviewed Today: Medications Reviewed Today   Medications were not reviewed in this encounter   Home Care and Equipment/Supplies: Not reviewed  Functional Questionnaire: Not completed  Follow up appointments reviewed: To be scheduled   Partial transition of care review completed with patient due to important questions patient had for PCP.  Message sent to Dr. Sedalia Muta for response regarding Potassium.  Report given to Hilbert Odor, [facility]  (who is cc'd on message) who will contact the patient to complete TOC call after response from MD.  Jodelle Gross RN, BSN, CCM [place]  [facility] Manager Population Health Direct Dial:  [phone]  Fax: [phone]

## 2023-04-09 ENCOUNTER — Telehealth: Payer: Self-pay

## 2023-04-09 NOTE — Progress Notes (Unsigned)
Subjective:  Patient ID: Audrey Ruiz, female    DOB: 01-14-1951  Age: 73 y.o. MRN: [id number]  No chief complaint on file.   HPI   Nonischemic cardiomyopathy thought to be due to adriamycin. CORONARY ARTERY DISEASE noncritical CAD.   CONGESTIVE HEART FAILURE - on Entresto 24-26 mg. Patient is status post BiV ICD 2022 with improved EF on her most recent echocardiogram 50 to 55%. Patient denies chest pain or shortness of breath.   Hyperlipidemia: Taking Zetia 10 mg once daily and Crestor 40 mg once daily at night. Patient tolerating medications well and is looking forward to visiting with family for the holidays. She is planning on eating healthy through the holidays.   GERD: Currently on omeprazole 20 mg once daily. No side effects and compliant with medication.   Depression, mild, recurrent: Currently on Prozac 40 mg once daily.    Insomnia: Takes melatonin and no longer take the lorazepam.     02/27/2023    2:14 PM 11/04/2022    2:26 PM 07/08/2022    1:59 PM 04/04/2022    8:52 AM 01/23/2022   10:27 AM  Depression screen PHQ 2/9  Decreased Interest 0 0 1 0 0  Down, Depressed, Hopeless 0 0 1 0 0  PHQ - 2 Score 0 0 2 0 0  Altered sleeping 0 0 0 0   Tired, decreased energy 0 0 1 0   Change in appetite 0 0 0 0   Feeling bad or failure about yourself  0 0 0 0   Trouble concentrating 0 0 0 0   Moving slowly or fidgety/restless 0 0 0 0   Suicidal thoughts 0 0 0 0   PHQ-9 Score 0 0 3 0   Difficult doing work/chores Not difficult at all Not difficult at all Not difficult at all Not difficult at all         11/04/2022    1:49 PM  Fall Risk   Falls in the past year? 1  Number falls in past yr: 0  Injury with Fall? 0  Risk for fall due to : No Fall Risks  Follow up Falls evaluation completed;Education provided    Patient Care Team: Blane Ohara, MD as PCP - General (Family Medicine) Thomasene Ripple, DO as PCP - Cardiology (Cardiology) Regan Lemming, MD as PCP -  Electrophysiology (Cardiology) Weston Settle, MD as Consulting Physician (Oncology) Thomasene Ripple, DO as Consulting Physician (Cardiology) Karma Greaser, PA as Physician Assistant (Orthopedic Surgery) Albin Felling, OD (Optometry) Ricky Stabs, RN as [facility] Management (General Practice) Jessy Oto, RN as Registered Nurse   Review of Systems  Current Outpatient Medications on File Prior to Visit  Medication Sig Dispense Refill   cetirizine (ALLERGY RELIEF CETIRIZINE) 10 MG tablet Take 1 tablet (10 mg total) by mouth at bedtime. 90 tablet 3   ezetimibe (ZETIA) 10 MG tablet Take 1 tablet (10 mg total) by mouth at bedtime. 90 tablet 3   FLUoxetine (PROZAC) 40 MG capsule Take 1 capsule (40 mg total) by mouth daily. 90 capsule 3   fluticasone (FLONASE) 50 MCG/ACT nasal spray USE 2 SPRAYS INTO EACH NOSTRIL ONCE DAILY *NEW PRESCRIPTION REQUEST* 48 g 3   Glucosamine-Chondroitin-MSM 1500-1200-500 MG PACK Take 1 tablet by mouth daily.     ibuprofen (ADVIL) 200 MG tablet Take 200 mg by mouth every 8 (eight) hours as needed for moderate pain. (Patient not taking: Reported on 04/09/2023)  levothyroxine (SYNTHROID) 25 MCG tablet Take 1 tablet (25 mcg total) by mouth daily before breakfast. 90 tablet 3   magic mouthwash (nystatin, lidocaine, diphenhydrAMINE, alum & mag hydroxide) suspension Swish and spit 5 mLs 2 (two) times daily as needed for mouth pain. (Patient not taking: Reported on 04/09/2023) 180 mL 0   Melatonin 10 MG CAPS Take 10 mg by mouth daily as needed (sleep).     Multiple Vitamin (MULTIVITAMIN) capsule Take 1 capsule by mouth daily. (Patient not taking: Reported on 04/03/2023)     nystatin (MYCOSTATIN) 100000 UNIT/ML suspension Take 5 mLs by mouth 4 (four) times daily. (Patient not taking: Reported on 04/09/2023)     Omega-3 Fatty Acids (FISH OIL) 1000 MG CAPS Take 1,000 mg by mouth 2 (two) times daily.     omeprazole (PRILOSEC) 20 MG capsule Take 1 capsule (20 mg  total) by mouth daily. 90 capsule 3   ondansetron (ZOFRAN-ODT) 4 MG disintegrating tablet Take 1 tablet (4 mg total) by mouth every 8 (eight) hours as needed for nausea or vomiting. (Patient not taking: Reported on 04/09/2023) 30 tablet 0   polyethylene glycol (MIRALAX / GLYCOLAX) 17 g packet Take 17 g by mouth daily as needed for mild constipation. Available OTC. (Patient not taking: Reported on 04/09/2023) 30 each 0   rosuvastatin (CRESTOR) 40 MG tablet TAKE 1 TABLET BY MOUTH EVERY DAY AT BEDTIME 30 tablet 10   sacubitril-valsartan (ENTRESTO) 24-26 MG TAKE 1 TABLET BY MOUTH IN THE MORNING AND 1 AT BEDTIME *NEW PRESCRIPTION REQUEST* 60 tablet 11   Simethicone 250 MG CAPS Take 250 mg by mouth in the morning and at bedtime. 60 capsule 0   Vitamin D, Cholecalciferol, 25 MCG (1000 UT) CAPS Take 1,000 Units by mouth daily.     No current facility-administered medications on file prior to visit.   Past Medical History:  Diagnosis Date   Abnormal nuclear stress test 08/08/2020   Abnormal nuclear stress test 08/08/2020   Anxiety    Closed fracture of distal end of left radius 08/16/2020   Depressed left ventricular ejection fraction    Depression    Diabetes mellitus without complication (HCC)    Dilated cardiomyopathy (HCC) 08/08/2020   Encounter for Medicare annual wellness exam 10/19/2019   Gastroesophageal reflux disease 10/19/2019   GERD (gastroesophageal reflux disease)    Hydropneumothorax 02/08/2021   Hyperlipidemia    Hyperlipidemia associated with type 2 diabetes mellitus (HCC) 10/19/2019   LBBB (left bundle branch block) 08/17/2020   Mitral valve prolapse    Nonischemic cardiomyopathy (HCC) 08/17/2020   Osteopenia 10/19/2019   Sinusitis 08/16/2019   Upper respiratory tract infection due to COVID-19 virus 03/18/2021   Past Surgical History:  Procedure Laterality Date   BIV ICD INSERTION CRT-D N/A 02/05/2021   Procedure: BIV ICD INSERTION CRT-D;  Surgeon: Regan Lemming, MD;   Location: [facility] INVASIVE CV LAB;  Service: Cardiovascular;  Laterality: N/A;   BREAST LUMPECTOMY Left 2010   CATARACT EXTRACTION BILATERAL W/ ANTERIOR VITRECTOMY  2021   EYE SURGERY Bilateral    Cataract Removal   LEFT HEART CATH AND CORONARY ANGIOGRAPHY N/A 08/08/2020   Procedure: LEFT HEART CATH AND CORONARY ANGIOGRAPHY;  Surgeon: Marykay Lex, MD;  Location: [facility] INVASIVE CV LAB;  Service: Cardiovascular;  Laterality: N/A;    Family History  Problem Relation Age of Onset   Osteoporosis Mother    Heart disease Father    Hyperlipidemia Father    Coronary artery disease Father  Diabetes Sister    Breast cancer Daughter    Diabetes Maternal Grandfather    Social History   Socioeconomic History   Marital status: Married    Spouse name: Viviann Spare   Number of children: 2   Years of education: Not on file   Highest education level: Master's degree (e.g., MA, MS, MEng, MEd, MSW, MBA)  Occupational History   Occupation: Retired  Tobacco Use   Smoking status: Never   Smokeless tobacco: Never  Vaping Use   Vaping status: Never Used  Substance and Sexual Activity   Alcohol use: Yes    Alcohol/week: 2.0 standard drinks of alcohol    Types: 2 Glasses of wine per week    Comment: occasional (white wine)   Drug use: Never   Sexual activity: Not Currently    Partners: Male  Other Topics Concern   Not on file  Social History Narrative   Son lives in [place], Daughter lives in [place]   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   Social Drivers of Health   Financial Resource Strain: Low Risk  (11/04/2022)   Overall Financial Resource Strain (CARDIA)    Difficulty of Paying Living Expenses: Not very hard  Food Insecurity: No Food Insecurity (04/09/2023)   Hunger Vital Sign    Worried About Running Out of Food in the Last Year: Never true    Ran Out of Food in the Last Year: Never true  Transportation Needs: No Transportation Needs (04/09/2023)   PRAPARE -  Administrator, Civil Service (Medical): No    Lack of Transportation (Non-Medical): No  Physical Activity: Sufficiently Active (11/04/2022)   Exercise Vital Sign    Days of Exercise per Week: 6 days    Minutes of Exercise per Session: 30 min  Stress: No Stress Concern Present (11/04/2022)   [employer] of Occupational Health - Occupational Stress Questionnaire    Feeling of Stress : Not at all  Social Connections: Socially Integrated (04/03/2023)   Social Connection and Isolation Panel [NHANES]    Frequency of Communication with Friends and Family: Three times a week    Frequency of Social Gatherings with Friends and Family: Three times a week    Attends Religious Services: 1 to 4 times per year    Active Member of Clubs or Organizations: Yes    Attends Banker Meetings: 1 to 4 times per year    Marital Status: Married    Objective:  There were no vitals taken for this visit.     04/07/2023    7:45 AM 04/07/2023    5:03 AM 04/06/2023    8:42 PM  BP/Weight  Systolic BP 106 113 131  Diastolic BP 56 63 76    Physical Exam  Diabetic Foot Exam - Simple   No data filed      Lab Results  Component Value Date   WBC 7.0 04/04/2023   HGB 12.7 04/04/2023   HCT 38.2 04/04/2023   PLT 254 04/04/2023   GLUCOSE 108 (H) 04/07/2023   CHOL 155 12/30/2022   TRIG 67 12/30/2022   HDL 86 12/30/2022   LDLCALC 56 12/30/2022   ALT 16 04/03/2023   AST 25 04/03/2023   NA 134 (L) 04/07/2023   K 4.5 04/07/2023   CL 106 04/07/2023   CREATININE 0.77 04/07/2023   BUN 12 04/07/2023   CO2 21 (L) 04/07/2023   TSH 1.639 09/13/2022   HGBA1C 5.6 12/30/2022  Assessment & Plan:    SBO (small bowel obstruction) (HCC)     No orders of the defined types were placed in this encounter.   No orders of the defined types were placed in this encounter.    Follow-up: No follow-ups on file.   I,Marla I Leal-Borjas,acting as a scribe for Blane Ohara, MD.,have  documented all relevant documentation on the behalf of Blane Ohara, MD,as directed by  Blane Ohara, MD while in the presence of Blane Ohara, MD.   An After Visit Summary was printed and given to the patient.  Blane Ohara, MD [facility] [phone]

## 2023-04-09 NOTE — Transitions of Care (Post Inpatient/ED Visit) (Signed)
04/09/2023  Name: Audrey Ruiz MRN: [id number] DOB: [birth year]  Today's TOC FU Call Status: Today's TOC FU Call Status:: Successful TOC FU Call Completed TOC FU Call Complete Date: 04/09/23 Patient's Name and Date of Birth confirmed.  Transition Care Management Follow-up Telephone Call Date of Discharge: 04/07/23 Discharge Facility: [facility]) Type of Discharge: Inpatient Admission Primary Inpatient Discharge Diagnosis:: SBO (small bowel obstruction) How have you been since you were released from the hospital?: Better Any questions or concerns?: No (CM received message from Dr Sedalia Muta states patient did not need to supplement potassium and patient reports understanding)  Items Reviewed: Did you receive and understand the discharge instructions provided?: Yes (Reviewed soft foods, avoid constipation, Continue stool softeners, Miralax as needed - Patient reports having a good BM this morning) Medications obtained,verified, and reconciled?: Yes (Medications Reviewed) Any new allergies since your discharge?: No Dietary orders reviewed?: Yes Type of Diet Ordered:: low sodium heart healthy  soft foods Do you have support at home?: Yes People in Home: spouse Name of Support/Comfort Primary Source: lives in home with husband who has dementia - patient states he is still able to do things to help, states son and daugther are not far away and can help as needed  Medications Reviewed Today: Medications Reviewed Today     Reviewed by Jessy Oto, RN (Registered Nurse) on 04/09/23 at 1122  Med List Status: <None>   Medication Order Taking? Sig Documenting Provider Last Dose Status Informant  cetirizine (ALLERGY RELIEF CETIRIZINE) 10 MG tablet [id number] Yes Take 1 tablet (10 mg total) by mouth at bedtime. Cox, Kirsten, MD Taking Active Self, Family Member, Pharmacy Records  ezetimibe (ZETIA) 10 MG tablet [id number] Yes Take 1 tablet (10 mg total) by mouth at bedtime. Cox,  Kirsten, MD Taking Active Self, Family Member, Pharmacy Records  FLUoxetine [facility]) 40 MG capsule [id number] Yes Take 1 capsule (40 mg total) by mouth daily. CoxFritzi Mandes, MD Taking Active Self, Family Member, Pharmacy Records  fluticasone [facility]) 50 MCG/ACT nasal spray [id number] Yes USE 2 SPRAYS INTO EACH NOSTRIL ONCE DAILY *NEW PRESCRIPTION REQUEST* Cox, Kirsten, MD Taking Active Self, Family Member, Pharmacy Records  Glucosamine-Chondroitin-MSM 1500-1200-500 MG PACK [id number] No Take 1 tablet by mouth daily. [provider] Unknown Active Self, Family Member, Pharmacy Records           Med Note [place], [place] A   Wed Apr 09, 2023 11:12 AM) Patient will check if she has in home and pick up more to restart  ibuprofen (ADVIL) 200 MG tablet [id number] No Take 200 mg by mouth every 8 (eight) hours as needed for moderate pain.  Patient not taking: Reported on 04/09/2023   [provider] Not Taking Active Self, Family Member, Pharmacy Records           Med Note [place], [place] A   Wed Apr 09, 2023 11:12 AM) Patient reports has not needed recently   levothyroxine (SYNTHROID) 25 MCG tablet [id number] Yes Take 1 tablet (25 mcg total) by mouth daily before breakfast. Cox, Kirsten, MD Taking Active Self, Family Member, Pharmacy Records  magic mouthwash (nystatin, lidocaine, diphenhydrAMINE, alum & mag hydroxide) suspension [id number] No Swish and spit 5 mLs 2 (two) times daily as needed for mouth pain.  Patient not taking: Reported on 04/09/2023   Renne Crigler, FNP Not Taking Active Self, Family Member, Pharmacy Records           Med Note [place], [place] A   Wed Apr 09, 2023 11:15 AM) Patient states she has not needed since discharge  Melatonin 10 MG CAPS [id number] Yes Take 10 mg by mouth daily as needed (sleep). [provider] Taking Active Self, Family Member, Pharmacy Records  Multiple Vitamin (MULTIVITAMIN) capsule [id number] No Take 1 capsule by mouth daily.  Patient not  taking: Reported on 04/03/2023   [provider] Not Taking Active Self, Family Member, Pharmacy Records  nystatin (MYCOSTATIN) 100000 UNIT/ML suspension [id number] No Take 5 mLs by mouth 4 (four) times daily.  Patient not taking: Reported on 04/09/2023   [provider] Not Taking Active Self, Family Member, Pharmacy Records           Med Note [place], [place] A   Wed Apr 09, 2023 11:18 AM) Patient states she will speak with PCP at tomorrow's visit regarding med  Omega-3 Fatty Acids (FISH OIL) 1000 MG CAPS [id number] Yes Take 1,000 mg by mouth 2 (two) times daily. [provider] Taking Active Self, Family Member, Pharmacy Records  omeprazole (PRILOSEC) 20 MG capsule [id number] Yes Take 1 capsule (20 mg total) by mouth daily. Cox, Kirsten, MD Taking Active Self, Family Member, Pharmacy Records  ondansetron (ZOFRAN-ODT) 4 MG disintegrating tablet [id number] No Take 1 tablet (4 mg total) by mouth every 8 (eight) hours as needed for nausea or vomiting.  Patient not taking: Reported on 04/09/2023   Cathren Harsh, MD Not Taking Active            Med Note [facility], [place] A   Wed Apr 09, 2023 11:19 AM) Patient states she did not fill and denies nausea  polyethylene glycol (MIRALAX / GLYCOLAX) 17 g packet [id number] No Take 17 g by mouth daily as needed for mild constipation. Available OTC.  Patient not taking: Reported on 04/09/2023   Cathren Harsh, MD Not Taking Active            Med Note [facility], Katherene Dinino A   Wed Apr 09, 2023 11:19 AM) Patient states she has not picked this up and is moving bowels  rosuvastatin (CRESTOR) 40 MG tablet [id number] Yes TAKE 1 TABLET BY MOUTH EVERY DAY AT BEDTIME Tobb, Kardie, DO Taking Active Self, Family Member, Pharmacy Records  sacubitril-valsartan (ENTRESTO) 24-26 [place] [id number] Yes TAKE 1 TABLET BY MOUTH IN THE MORNING AND 1 AT BEDTIME *NEW PRESCRIPTION REQUEST* Tobb, Kardie, DO Taking Active Self, Family Member, Pharmacy Records  Simethicone 250  MG CAPS [id number] Yes Take 250 mg by mouth in the morning and at bedtime. Renne Crigler, FNP Taking Active Self, Family Member, Pharmacy Records  Vitamin D, Cholecalciferol, 25 MCG (1000 UT) CAPS [id number] Yes Take 1,000 Units by mouth daily. [provider] Taking Active Self, Family Member, Pharmacy Records            Home Care and Equipment/Supplies: Were Home Health Services Ordered?: No (Patient denies) Any new equipment or medical supplies ordered?: No (patient denies)  Functional Questionnaire: Do you need assistance with bathing/showering or dressing?: No Do you need assistance with meal preparation?: Yes (Patient states she needs some occassional help and states she has a paid caregiver that helps as needed) Do you need assistance with eating?: No Do you have difficulty maintaining continence: No Do you need assistance with getting out of bed/getting out of a chair/moving?: No Do you have difficulty managing or taking your medications?: No  Follow up appointments reviewed: PCP Follow-up appointment confirmed?: Yes Date of PCP follow-up appointment?: 04/10/23 Follow-up Provider: [facility]  Follow-up appointment confirmed?: NA Do you need transportation to your follow-up appointment?: No (patient drives and denies need with help) Do you understand care options if your condition(s) worsen?: Yes-patient verbalized understanding  SDOH Interventions Today    Flowsheet Row Most Recent Value  SDOH Interventions   Food Insecurity Interventions Intervention Not Indicated  Housing Interventions Intervention Not Indicated  Transportation Interventions Intervention Not Indicated  Utilities Interventions Intervention Not Indicated       Goals Addressed               This Visit's Progress     Patient will report no readmissions in the next 30 days (pt-stated)        Current Barriers:  Knowledge deficit related to management of constipation which  resulted in small bowel obstruction  RNCM Clinical Goal(s):  Patient will work with the Care Management team over the next 30 days to address Transition of Care Barriers: related to constipation and small bowel obstruction Patient will verbalize basic understanding of ways to prevent constipation  Patient will take all medications exactly as prescribed and will call provider for medication related questions as evidenced by patient report and health record. Patient will attend all scheduled medical appointments: PCP appointment scheduled 04/10/23 for hospital follow up as evidenced by patient report and health record.    Interventions: Evaluation of current treatment plan related to  self management and patient's adherence to plan as established by provider Education provided to patient  related to need to increase fluid intake and fiber intake, and avoid caffeine Education provided to patient to monitor bowel movements and report any decrease or difficulty to RN or MD  Education provided to patient to increase activity as tolerated Educated patient that per PCP, her potassium level was good when last check and she does not need to supplement  Educated patient that PCP hospital follow up is 04/10/23 at 11am and to take her discharge paperwork to the appointment  Transitions of Care:  New goal. Doctor Visits  - discussed the importance of doctor visits Contacted provider for patient needs Armanda Heritage, [facility] contacted Dr Sedalia Muta on 04/08/23 regarding patient's questions around potassium. I received message from Dr Sedalia Muta stating patient does not need potassium supplement. Patient notified of MD response and suggestions on 04/09/23 by this RNCM.  Encouraged patient to keep pending PCP follow up scheduled for 04/10/23 at 11am with Dr Sedalia Muta  Patient Goals/Self-Care Activities: Participate in Transition of Care Program/Attend [facility] scheduled calls Notify RN Care Manager of [facility] call rescheduling needs Take all  medications as prescribed Attend all scheduled provider appointments Call provider office for new concerns or questions  Increase fluid intake, fiber intake  Follow Up Plan:  Telephone follow up appointment with care management team member scheduled for:  04/16/23 at 2pm The patient has been provided with contact information for the care management team and has been advised to call with any health related questions or concerns.          Patient reports doing well and moving bowels without difficulty. Patient agreed to enroll in [facility] 30 day program - Patient was provided this RNCM's contact information   Hilbert Odor RN, CCM [facility]  VBCI-Population Health RN Care Manager [phone]

## 2023-04-10 ENCOUNTER — Ambulatory Visit (INDEPENDENT_AMBULATORY_CARE_PROVIDER_SITE_OTHER): Payer: PPO

## 2023-04-10 VITALS — BP 106/78 | HR 66 | Temp 97.4°F | Ht 66.5 in | Wt 135.4 lb

## 2023-04-10 DIAGNOSIS — K56609 Unspecified intestinal obstruction, unspecified as to partial versus complete obstruction: Secondary | ICD-10-CM | POA: Diagnosis not present

## 2023-04-10 DIAGNOSIS — E876 Hypokalemia: Secondary | ICD-10-CM

## 2023-04-10 NOTE — Assessment & Plan Note (Signed)
Partial Small Bowel Obstruction Presented with abdominal pain, nausea, vomiting, and diarrhea for five days, leading to an ER visit on April 03, 2023. Managed with NG tube, NPO status, and IV fluids. Symptoms improved post-discharge. Current symptoms include bloating without pain, and active bowel sounds. No clear etiology identified. Discussed the importance of avoiding constipation to prevent recurrence. - Monitor bowel movements and adjust Miralax dosage to half a packet every other day to prevent diarrhea. - Consider fiber supplements if bowel movements remain regular. - Advise to report any worsening bloating, pain, or changes in bowel habits.

## 2023-04-10 NOTE — Assessment & Plan Note (Addendum)
Experienced low potassium levels during hospitalization due to vomiting and diarrhea. Potassium levels normalized to 4.5 at discharge. No current symptoms of hypokalemia. Advised that dietary potassium should suffice unless symptoms recur. - Order metabolic profile to check current potassium levels. - Advise to maintain adequate hydration and monitor for symptoms of hypokalemia.    Heart Failure Reports no significant swelling in legs and no need to restrict fluid intake. Mild swelling in feet noted but not concerning. Discussed the importance of monitoring for signs of fluid overload. - Monitor for any signs of fluid overload or worsening heart failure symptoms.  Oral Thrush Previous episode treated with swish and swallow medication. No current signs or symptoms of thrush on examination. Discussed that the treatment was unlikely to cause irritation. - No further treatment required for oral thrush at this time.  General Health Maintenance Reports feeling weak and tired, likely due to recent hospitalization and personal stressors. Emphasized adequate hydration and self-care. Discussed balancing caregiving responsibilities with self-care. - Encourage to take time for self-care and manage stress. - Advise to stay hydrated and maintain a balanced diet.  Follow-up - Follow up regarding metabolic profile results by tomorrow. - Advise to contact the clinic if symptoms worsen or new symptoms develop.

## 2023-04-11 LAB — COMPREHENSIVE METABOLIC PANEL
ALT: 18 [IU]/L (ref 0–32)
AST: 23 [IU]/L (ref 0–40)
Albumin: 3.9 g/dL (ref 3.8–4.8)
Alkaline Phosphatase: 53 [IU]/L (ref 44–121)
BUN/Creatinine Ratio: 14 (ref 12–28)
BUN: 12 mg/dL (ref 8–27)
Bilirubin Total: 0.4 mg/dL (ref 0.0–1.2)
CO2: 20 mmol/L (ref 20–29)
Calcium: 9.8 mg/dL (ref 8.7–10.3)
Chloride: 101 mmol/L (ref 96–106)
Creatinine, Ser: 0.86 mg/dL (ref 0.57–1.00)
Globulin, Total: 2.3 g/dL (ref 1.5–4.5)
Glucose: 86 mg/dL (ref 70–99)
Potassium: 4.8 mmol/L (ref 3.5–5.2)
Sodium: 135 mmol/L (ref 134–144)
Total Protein: 6.2 g/dL (ref 6.0–8.5)
eGFR: 71 mL/min/{1.73_m2} (ref >59)

## 2023-04-14 IMAGING — DX DG CHEST 2V
2 series · 2 of 2 positions shown · non-contrast
Comparison: Prior chest x-ray 02/13/2021

CLINICAL DATA: 70-year-old female with a history of right-sided
pneumothorax

EXAM:
CHEST - 2 VIEW

[chest pa]
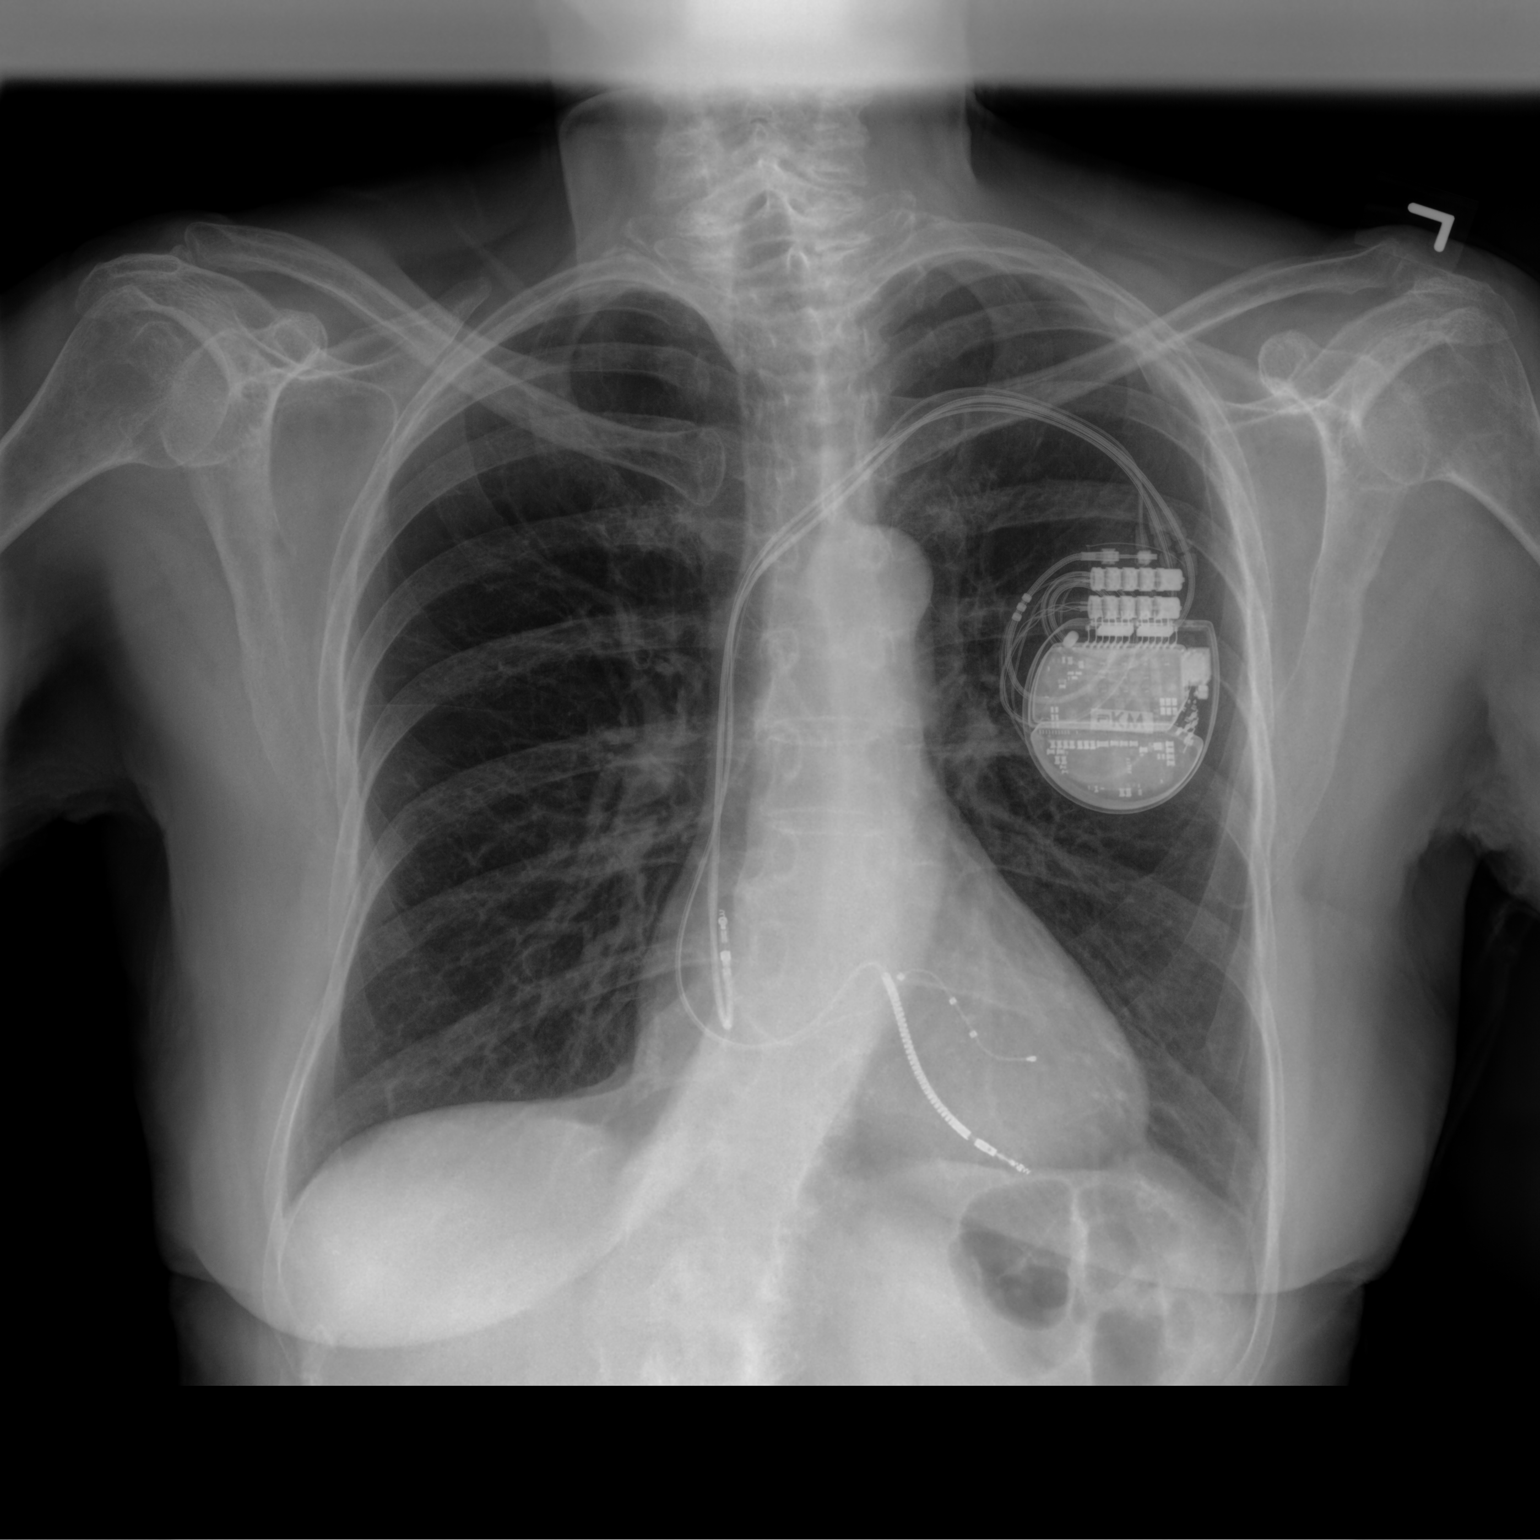

[chest lat]
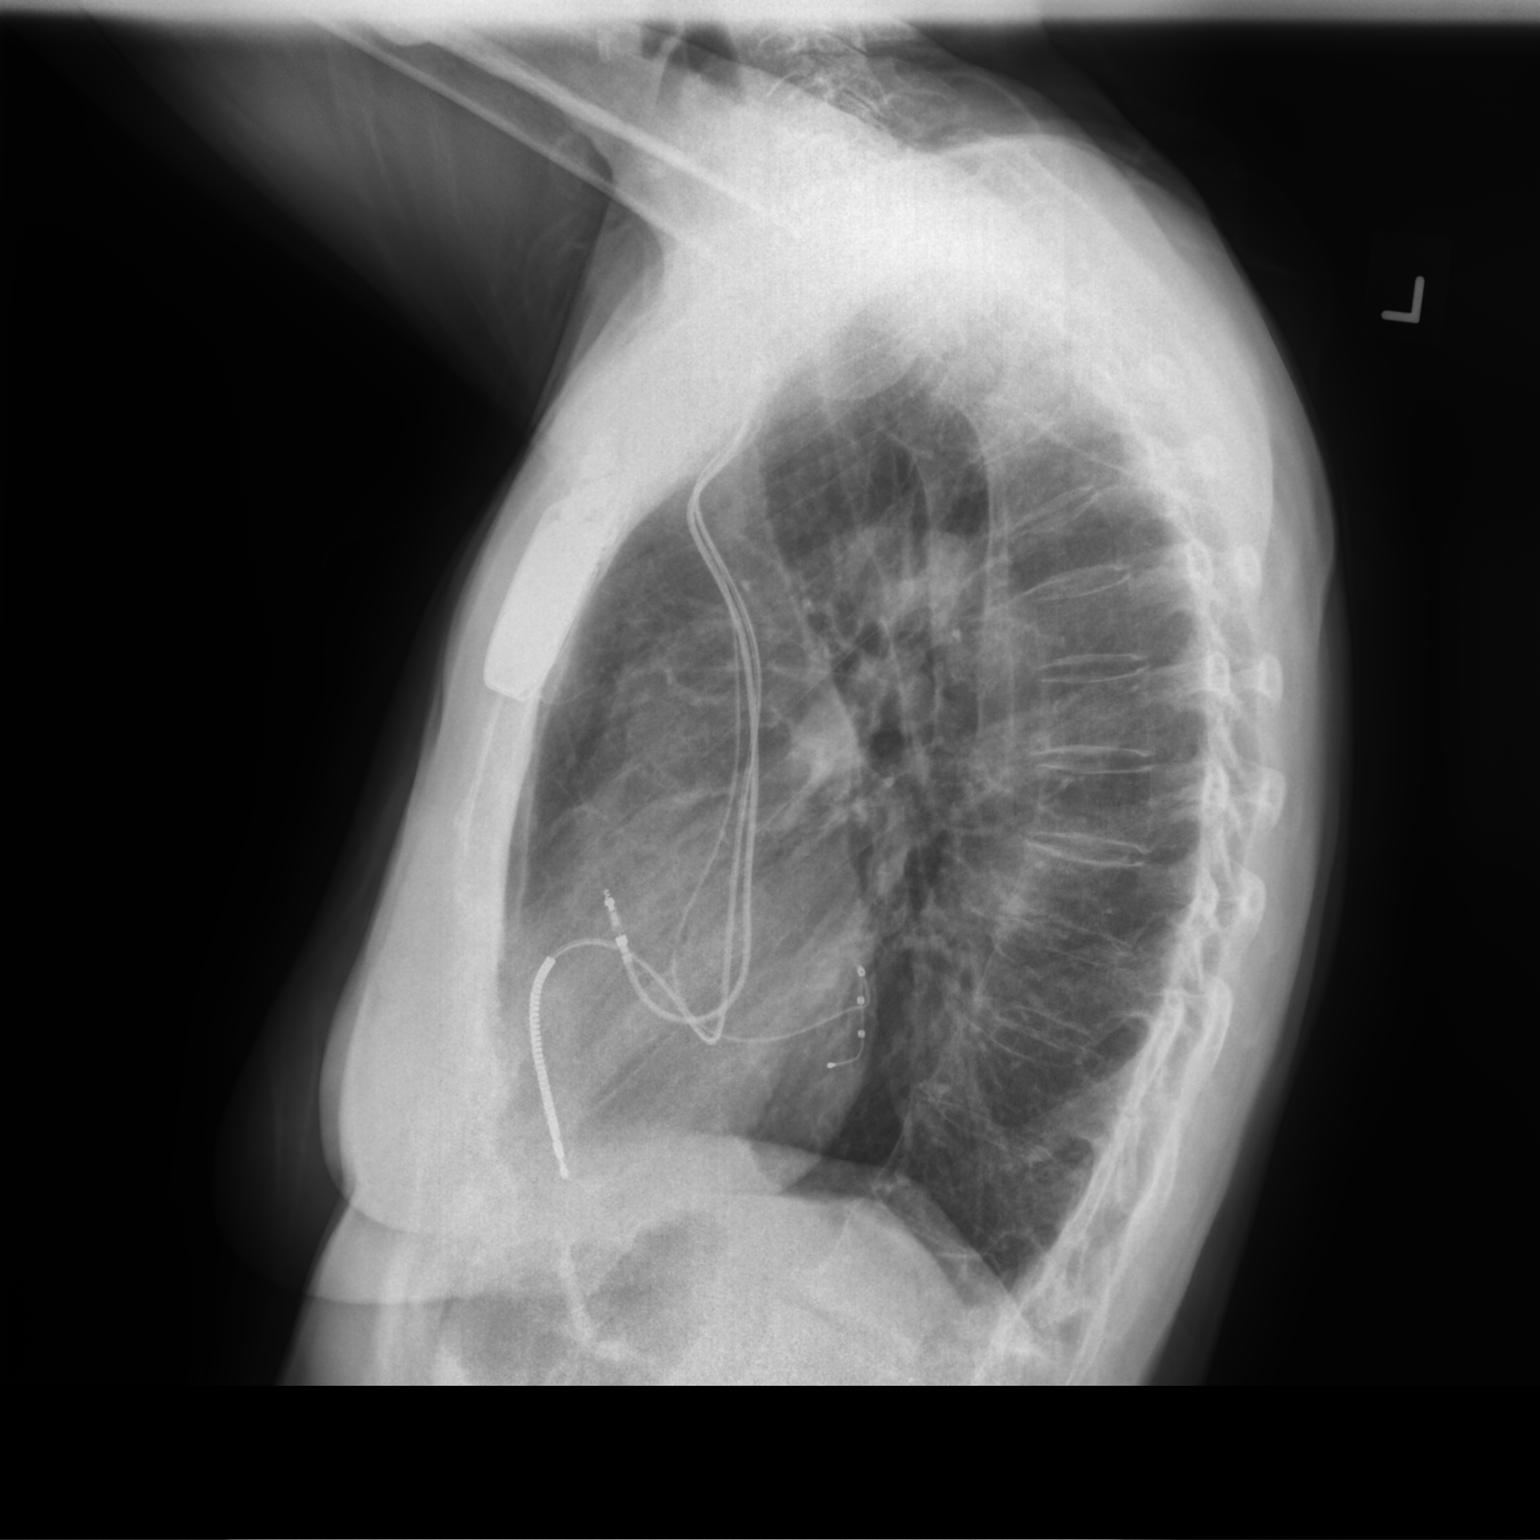

[2 of 2 positions shown; findings below may reference images not displayed]

FINDINGS: Think this is interval resolution of left-sided pneumothorax. Stable
position of left subclavian approach biventricular cardiac rhythm
maintenance device. Leads remain unchanged. Cardiac and mediastinal
contours are normal. The right lung is clear. Stable mild background
bronchitic changes. No acute osseous abnormality.
IMPRESSION: Interval resolution of left-sided pneumothorax.

Stable bronchitic changes.

## 2023-04-16 ENCOUNTER — Other Ambulatory Visit: Payer: PPO | Admitting: *Deleted

## 2023-04-16 ENCOUNTER — Other Ambulatory Visit: Payer: Self-pay

## 2023-04-16 ENCOUNTER — Telehealth: Payer: Self-pay | Admitting: Pharmacy Technician

## 2023-04-16 ENCOUNTER — Other Ambulatory Visit (HOSPITAL_COMMUNITY): Payer: Self-pay

## 2023-04-16 NOTE — Telephone Encounter (Signed)
 Pharmacy Patient Advocate Encounter  Received notification from [facility] [employer] that Prior Authorization for entresto has been APPROVED from 04/16/23 to 04/14/24. Ran test claim, Copay is $0.00- one month. This test claim was processed through [facility]- copay amounts may vary at other pharmacies due to pharmacy/plan contracts, or as the patient moves through the different stages of their insurance plan.   PA #/Case ID/Reference #: [id number]

## 2023-04-16 NOTE — Patient Outreach (Signed)
 Care Management  Transitions of Care Program Transitions of Care Post-discharge week 2 Closure Note   04/16/2023 Name: Audrey Ruiz MRN: [id number] DOB: 19-Sep-1950  Subjective: Audrey Ruiz is a 73 y.o. year old female who is a primary care patient of [facility], Kirsten, MD. The Care Management team Engaged with patient Engaged with patient by telephone to assess and address transitions of care needs.   Consent to Services:  Patient was given information about care management services, agreed to services, and gave verbal consent to participate.   Assessment:     TOC RNCM spoke with patient for scheduled follow up. Patient confirmed she was seen in PCP office for hospital follow up and labs were checked. Potassium has improved further and is now 4.8 - Patient states she is also being followed by her Clinical biochemist and has spoken with them twice and they made arrangement for meals. Patient denied need for additional TOC follow up calls. TOC RNCM advised patient to call with any new issues/concerns. Patient has TOC RNCM's name/number.      SDOH Interventions    Flowsheet Row Telephone from 04/09/2023 in [place] [facility] Clinical Support from 11/04/2022 in [facility] Patient Outreach from 09/23/2022 in [place] [facility] Chronic Care Management from 06/12/2022 in [facility] Chronic Care Management from 01/23/2022 in [facility] Chronic Care Management from 12/07/2021 in [facility]  SDOH Interventions        Food Insecurity Interventions Intervention Not Indicated Intervention Not Indicated -- -- Intervention Not Indicated --  Housing Interventions Intervention Not Indicated Intervention Not Indicated -- -- Intervention Not Indicated --  Transportation Interventions Intervention Not Indicated Intervention Not Indicated -- -- Intervention Not  Indicated Intervention Not Indicated  Utilities Interventions Intervention Not Indicated Intervention Not Indicated -- -- Intervention Not Indicated --  Alcohol Usage Interventions -- Intervention Not Indicated (Score <7) -- -- Intervention Not Indicated (Score <7) --  Financial Strain Interventions -- Intervention Not Indicated -- -- Intervention Not Indicated, Other (Comment)  [works with pharm D for medications assistance] Intervention Not Indicated  Physical Activity Interventions -- Intervention Not Indicated -- -- Intervention Not Indicated --  Stress Interventions -- Intervention Not Indicated Intervention Not Indicated, Other (Comment)  [elderly Father passed away on 09/04/22] -- Other (Comment)  [has a lot on her plate, caregvier of others, talked about self care, states she is a calm person] --  Social Connections Interventions -- Intervention Not Indicated -- Intervention Not Indicated -- --  Health Literacy Interventions -- Intervention Not Indicated Intervention Not Indicated -- -- --        Goals Addressed               This Visit's Progress     COMPLETED: Patient will report no readmissions in the next 30 days (pt-stated)        Current Barriers:  Knowledge deficit related to management of constipation which resulted in small bowel obstruction 04/16/23 Patient states she has had MD appointment and has received calls from the nurse with her insurance company and is getting meals and denied need for additional TOC follow up calls. TOC RNCM advised patient to call with any new issues/concerns  RNCM Clinical Goal(s):  04/16/23 Patient states she has had MD appointment and has received calls from the nurse with her insurance company and is getting meals and denied need for additional TOC follow  up calls. TOC RNCM advised patient to call with any new issues/concerns   Interventions: 04/16/23 Patient states she has had MD appointment and has received calls from the nurse with her  insurance company and is getting meals and denied need for additional TOC follow up calls. TOC RNCM advised patient to call with any new issues/concerns  Transitions of Care:  Patient declined further engagement on this goal. Encouraged patient to keep pending PCP follow up scheduled for 04/10/23 at 11am with Dr Sedalia Muta  - 04/16/23 patient confirmed she had appointment and states blood work was done - Potassium level has improved further and is now 4.8 as of 04/10/23 blood work 04/16/23 Patient states she has had MD appointment and has received calls from the nurse with her insurance company and is getting meals and denied need for additional TOC follow up calls. TOC RNCM advised patient to call with any new issues/concerns  Patient Goals/Self-Care Activities: 04/16/23 Patient states she has had MD appointment and has received calls from the nurse with her insurance company and is getting meals and denied need for additional TOC follow up calls. TOC RNCM advised patient to call with any new issues/concerns - Patient reports having bowel movement today   Follow Up Plan: Patient states she has had MD appointment and has received calls from the nurse with her insurance company and is getting meals and denied need for additional TOC follow up calls. TOC RNCM advised patient to call with any new issues/concerns  The patient has been provided with contact information for the care management team and has been advised to call with any health related questions or concerns.          Plan: 04/16/23 Patient states she has had MD appointment and has received calls from the nurse with her insurance company and is getting meals and denied need for additional TOC follow up calls. TOC RNCM advised patient to call with any new issues/concerns  The patient has been provided with contact information for the care management team and has been advised to call with any health related questions or concerns.   Hilbert Odor RN,  CCM [place]  VBCI-Population Health RN Care Manager [phone]

## 2023-04-16 NOTE — Telephone Encounter (Signed)
 Pharmacy Patient Advocate Encounter   Received notification from CoverMyMeds that prior authorization for entresto is required/requested.   Insurance verification completed.   The patient is insured through [facility] ADVANTAGE/RX ADVANCE .   Per test claim: PA required; PA submitted to above mentioned insurance via CoverMyMeds Key/confirmation #/[id number] Status is pending

## 2023-04-16 NOTE — Patient Instructions (Signed)
 Visit Information  Thank you for taking time to visit with me today. Please don't hesitate to contact me if I can be of assistance to you in the future.    Following is a copy of your care plan:   Goals Addressed               This Visit's Progress     COMPLETED: Patient will report no readmissions in the next 30 days (pt-stated)        Current Barriers:  Knowledge deficit related to management of constipation which resulted in small bowel obstruction 04/16/23 Patient states she has had MD appointment and has received calls from the nurse with her insurance company and is getting meals and denied need for additional TOC follow up calls. TOC RNCM advised patient to call with any new issues/concerns  RNCM Clinical Goal(s):  04/16/23 Patient states she has had MD appointment and has received calls from the nurse with her insurance company and is getting meals and denied need for additional TOC follow up calls. TOC RNCM advised patient to call with any new issues/concerns   Interventions: 04/16/23 Patient states she has had MD appointment and has received calls from the nurse with her insurance company and is getting meals and denied need for additional TOC follow up calls. TOC RNCM advised patient to call with any new issues/concerns  Transitions of Care:  Patient declined further engagement on this goal. Encouraged patient to keep pending PCP follow up scheduled for 04/10/23 at 11am with Dr Sedalia Muta  - 04/16/23 patient confirmed she had appointment and states blood work was done - Potassium level has improved further and is now 4.8 as of 04/10/23 blood work 04/16/23 Patient states she has had MD appointment and has received calls from the nurse with her insurance company and is getting meals and denied need for additional TOC follow up calls. TOC RNCM advised patient to call with any new issues/concerns  Patient Goals/Self-Care Activities: 04/16/23 Patient states she has had MD appointment and has  received calls from the nurse with her insurance company and is getting meals and denied need for additional TOC follow up calls. TOC RNCM advised patient to call with any new issues/concerns - Patient reports having bowel movement today   Follow Up Plan: Patient states she has had MD appointment and has received calls from the nurse with her insurance company and is getting meals and denied need for additional TOC follow up calls. TOC RNCM advised patient to call with any new issues/concerns  The patient has been provided with contact information for the care management team and has been advised to call with any health related questions or concerns.          Patient verbalizes understanding of instructions and care plan provided today and agrees to view in MyChart. Active MyChart status and patient understanding of how to access instructions and care plan via MyChart confirmed with patient.     The patient has been provided with contact information for the care management team and has been advised to call with any health related questions or concerns.   Patient states she has had MD appointment and has received calls from the nurse with her insurance company and is getting meals and denied need for additional TOC follow up calls. TOC RNCM advised patient to call with any new issues/concerns  Please call the Suicide and Crisis Lifeline: 988 call the [place] National Suicide Prevention Lifeline: 212-457-0629 or TTY: 217-467-0922 TTY (660)487-1684)  to talk to a trained counselor call 1-800-273-TALK (toll free, 24 hour hotline) call 911 if you are experiencing a Mental Health or Behavioral Health Crisis or need someone to talk to.  Hilbert Odor RN, CCM [place]  VBCI-Population Health RN Care Manager [phone]

## 2023-04-17 ENCOUNTER — Telehealth: Payer: Self-pay

## 2023-04-17 NOTE — Telephone Encounter (Signed)
 Pt called stating she feels vibrations everyday at the same time. I had her send a manual transmission with her home remote monitor for the nurse to review. Transmission received. I told her the nurse will review it and give her a call back.

## 2023-04-17 NOTE — Telephone Encounter (Signed)
 Patient reports feeling the vibrations for a long time.  States she was wondering what the vibration was. Explained to patient there are no findings on the remote that has triggered the alert. Patient states she does remember coming in the past and was told it was the automated test that was run. Patient is fine to leave things as is and agreeable to call with any changes.  Note decrease in Corevue. Pt reports recent hospitalization d/t bowel blockage. Reports taking Entresto 24-26mg  (1 in am and 1 in pm). Reports minimal swelling in the top of left foot/toes, other than that no symptoms.  Routing to Dr. Mallory Shirk who manages [place].

## 2023-04-17 NOTE — Telephone Encounter (Signed)
 Unable to see reason why ICD would vibrate on remote. Consulting with Darrol Angel. [employer] rep.

## 2023-04-18 ENCOUNTER — Telehealth: Payer: Self-pay

## 2023-04-18 NOTE — Telephone Encounter (Signed)
 Alert received from [employer] for Multiple AMS EGM's, AF noted, extended diagnostics indicate 1hr episode 2/17, overall controlled rates No hx of PAF, no OAC per EPIC - route to triage per protocol HF diagnostics currently abnormal.  Patient was admited during time of AF for SBO. Patient is currently home and feeling much better. Previous note sent to Dr. Servando Salina in regard to HF disgnostics. Please see phone note from 04/17/23 for further review.   Did not send patient to AF clinic since patient was admitted in hospital for SBO in which AF was noted at same time and has not had any further events since hospital discharge.   Routing to Dr. Elberta Fortis to see if continue to monitor or warrants OAC.

## 2023-04-21 NOTE — Telephone Encounter (Signed)
 PT needs to follow up with gen cards with increased fluid on diagnostics. Routing to gen cards triage.

## 2023-04-21 NOTE — Telephone Encounter (Signed)
 Spoke with patient and she already has appointment scheduled with gen cards next monday

## 2023-04-27 NOTE — Progress Notes (Unsigned)
  Cardiology Office Note:  .   Date:  04/27/2023  ID:  Audrey Ruiz, DOB Mar 23, 1950, MRN [id number] PCP: Blane Ohara, MD  [facility] Providers Cardiologist:  Thomasene Ripple, DO Electrophysiologist:  Regan Lemming, MD { Click to update primary MD,subspecialty MD or APP then REFRESH:1}   History of Present Illness: .   Audrey Ruiz is a 73 y.o. female with a past medical history of HFrEF, presence of ICD, nonobstructive CAD per left heart cath in 2022, left bundle branch block, GERD, DM2, dyslipidemia, history of breast cancer.  01/21/2023 device check normal device check 05/30/2021 echo EF 50 to 55%, grade 1 DD, no valvular abnormalities 02/05/2021 biventricular ICD insertion 12/04/2020 cardiac MRI no delay GLA 08/08/2020 left heart cath in geographically minimal CAD  Evaluated by Dr. Elberta Fortis on 05/06/2022, she was stable from a cardiac perspective, continue to be active by walking, no changes were made to her plan of care she was advised to follow-up in 12 months.  ROS: ***  Studies Reviewed: .        *** Risk Assessment/Calculations:   {Does this patient have ATRIAL FIBRILLATION?[phone]} No BP recorded.  {Refresh Note OR Click here to enter BP  :1}***       Physical Exam:   VS:  There were no vitals taken for this visit.   Wt Readings from Last 3 Encounters:  04/10/23 135 lb 6.4 oz (61.4 kg)  04/03/23 129 lb (58.5 kg)  04/01/23 133 lb 12.8 oz (60.7 kg)    GEN: Well nourished, well developed in no acute distress NECK: No JVD; No carotid bruits CARDIAC: ***RRR, no murmurs, rubs, gallops RESPIRATORY:  Clear to auscultation without rales, wheezing or rhonchi  ABDOMEN: Soft, non-tender, non-distended EXTREMITIES:  No edema; No deformity   ASSESSMENT AND PLAN: .   ***    {Are you ordering a CV Procedure (e.g. stress test, cath, DCCV, TEE, etc)?   Press F2        :[id number]}  Dispo: ***  Signed, Flossie Dibble, NP

## 2023-04-28 ENCOUNTER — Ambulatory Visit: Payer: PPO | Attending: Cardiology | Admitting: Cardiology

## 2023-04-28 VITALS — BP 100/66 | HR 78 | Ht 66.5 in | Wt 135.0 lb

## 2023-04-28 DIAGNOSIS — I428 Other cardiomyopathies: Secondary | ICD-10-CM | POA: Diagnosis not present

## 2023-04-28 DIAGNOSIS — Z95810 Presence of automatic (implantable) cardiac defibrillator: Secondary | ICD-10-CM

## 2023-04-28 DIAGNOSIS — I5022 Chronic systolic (congestive) heart failure: Secondary | ICD-10-CM

## 2023-04-28 DIAGNOSIS — I447 Left bundle-branch block, unspecified: Secondary | ICD-10-CM | POA: Diagnosis not present

## 2023-04-28 DIAGNOSIS — E1169 Type 2 diabetes mellitus with other specified complication: Secondary | ICD-10-CM | POA: Diagnosis not present

## 2023-04-28 DIAGNOSIS — E785 Hyperlipidemia, unspecified: Secondary | ICD-10-CM | POA: Diagnosis not present

## 2023-04-28 NOTE — Patient Instructions (Signed)
Medication Instructions:  Your physician recommends that you continue on your current medications as directed. Please refer to the Current Medication list given to you today.  *If you need a refill on your cardiac medications before your next appointment, please call your pharmacy*   Lab Work: None Ordered If you have labs (blood work) drawn today and your tests are completely normal, you will receive your results only by: MyChart Message (if you have MyChart) OR A paper copy in the mail If you have any lab test that is abnormal or we need to change your treatment, we will call you to review the results.   Testing/Procedures: None Ordered   Follow-Up: At [facility], you and your health needs are our priority.  As part of our continuing mission to provide you with exceptional heart care, we have created designated Provider Care Teams.  These Care Teams include your primary Cardiologist (physician) and Advanced Practice Providers (APPs -  Physician Assistants and Nurse Practitioners) who all work together to provide you with the care you need, when you need it.  We recommend signing up for the patient portal called "MyChart".  Sign up information is provided on this After Visit Summary.  MyChart is used to connect with patients for Virtual Visits (Telemedicine).  Patients are able to view lab/test results, encounter notes, upcoming appointments, etc.  Non-urgent messages can be sent to your provider as well.   To learn more about what you can do with MyChart, go to [email or web address].    Your next appointment:   12 month(s)  The format for your next appointment:   In Person  Provider:   Wallis Bamberg, NP   Other Instructions NA

## 2023-05-06 ENCOUNTER — Ambulatory Visit (INDEPENDENT_AMBULATORY_CARE_PROVIDER_SITE_OTHER): Payer: PPO

## 2023-05-06 DIAGNOSIS — I428 Other cardiomyopathies: Secondary | ICD-10-CM | POA: Diagnosis not present

## 2023-05-07 LAB — CUP PACEART REMOTE DEVICE CHECK
Battery Remaining Longevity: 65 mo
Battery Remaining Percentage: 69 %
Battery Voltage: 2.96 V
Brady Statistic AP VP Percent: 1 %
Brady Statistic AP VS Percent: 1 %
Brady Statistic AS VP Percent: 96 %
Brady Statistic AS VS Percent: 1 %
Brady Statistic RA Percent Paced: 1 %
Date Time Interrogation Session: 20250318030040
HighPow Impedance: 56 Ohm
Implantable Lead Connection Status: 753985
Implantable Lead Connection Status: 753985
Implantable Lead Connection Status: 753985
Implantable Lead Implant Date: 20221219
Implantable Lead Implant Date: 20221219
Implantable Lead Implant Date: 20221219
Implantable Lead Location: 753858
Implantable Lead Location: 753859
Implantable Lead Location: 753860
Implantable Pulse Generator Implant Date: 20221219
Lead Channel Impedance Value: 340 Ohm
Lead Channel Impedance Value: 410 Ohm
Lead Channel Impedance Value: 640 Ohm
Lead Channel Pacing Threshold Amplitude: 0.75 V
Lead Channel Pacing Threshold Amplitude: 0.75 V
Lead Channel Pacing Threshold Amplitude: 1.625 V
Lead Channel Pacing Threshold Pulse Width: 0.5 ms
Lead Channel Pacing Threshold Pulse Width: 0.5 ms
Lead Channel Pacing Threshold Pulse Width: 0.5 ms
Lead Channel Sensing Intrinsic Amplitude: 1.1 mV
Lead Channel Sensing Intrinsic Amplitude: 2.5 mV
Lead Channel Setting Pacing Amplitude: 1.75 V
Lead Channel Setting Pacing Amplitude: 2 V
Lead Channel Setting Pacing Amplitude: 2.125
Lead Channel Setting Pacing Pulse Width: 0.5 ms
Lead Channel Setting Pacing Pulse Width: 0.5 ms
Lead Channel Setting Sensing Sensitivity: 0.5 mV
Pulse Gen Serial Number: 111053351
Zone Setting Status: 755011

## 2023-06-23 NOTE — Addendum Note (Signed)
 Addended by: Lott Rouleau A on: 06/23/2023 10:45 AM   Modules accepted: Orders

## 2023-06-23 NOTE — Progress Notes (Signed)
 Remote ICD transmission.

## 2023-07-02 ENCOUNTER — Telehealth: Payer: Self-pay | Admitting: Oncology

## 2023-07-02 NOTE — Telephone Encounter (Signed)
 Patient has been scheduled for follow-up visit per 06/30/23 LOS.  Pt aware of scheduled appt details.

## 2023-07-09 ENCOUNTER — Ambulatory Visit: Payer: PPO | Admitting: Family Medicine

## 2023-07-24 NOTE — Progress Notes (Signed)
 Subjective:  Patient ID: Audrey Ruiz, female    DOB: 10/13/1950  Age: 73 y.o. MRN: [id number]  Chief Complaint  Patient presents with   Medical Management of Chronic Issues    HPI: Pre- Diabetes: Last Hba1c: 5.6  Nonischemic cardiomyopathy thought to be due to adriamycin. CORONARY ARTERY DISEASE noncritical cad,   CONGESTIVE HEART FAILURE - on entresto  24-26 MG TWICE A DAY.  s/p biventricular ICD in 2022, most recent echo in April 2023 revealed EF 50 to 55%. NYHA class I, euvolemic.   Hyperlipidemia: on zetia  10 mg once daily, on crestor  40 mg once daily at night.   GERD: Currently on omeprazole  20 mg once daily.  Depression, mild, recurrent: Currently on Prozac  40 mg once daily.    Insomnia: takes melatonin. would like lorazepam .   Patient fell last week when she was climbing a fence. She hit her head. No LOC. She is very clumsy and has fallen many times in her life time.      07/25/2023    9:27 AM 04/10/2023    3:40 PM 02/27/2023    2:14 PM 11/04/2022    2:26 PM 07/08/2022    1:59 PM  Depression screen PHQ 2/9  Decreased Interest 0 0 0 0 1  Down, Depressed, Hopeless 1 0 0 0 1  PHQ - 2 Score 1 0 0 0 2  Altered sleeping 0  0 0 0  Tired, decreased energy 2  0 0 1  Change in appetite 0  0 0 0  Feeling bad or failure about yourself  1  0 0 0  Trouble concentrating 0  0 0 0  Moving slowly or fidgety/restless 0  0 0 0  Suicidal thoughts 0  0 0 0  PHQ-9 Score 4  0 0 3  Difficult doing work/chores Not difficult at all  Not difficult at all Not difficult at all Not difficult at all        07/25/2023    9:31 AM  Fall Risk   Falls in the past year? 1  Number falls in past yr: 0  Injury with Fall? 0  Risk for fall due to : History of fall(s)  Follow up Falls evaluation completed;Falls prevention discussed    Patient Care Team: Mercy Stall, MD as PCP - General (Family Medicine) Lei Pump, MD as PCP - Electrophysiology (Cardiology) Manfred Seed, MD as PCP - Cardiology (Cardiology) Deloria Fetch, MD as Consulting Physician (Oncology) Tobb, Kardie, DO as Consulting Physician (Cardiology) Forrest Iha, [place] as Physician Assistant (Orthopedic Surgery) Murtis Arthur, OD (Optometry) Remona Carmel, RN as [facility] Care Management (General Practice) Sharmaine Dearth, RN as Registered Nurse Remona Carmel, RN   Review of Systems  Constitutional:  Negative for chills, fatigue and fever.  HENT:  Negative for congestion, ear pain, rhinorrhea and sore throat.   Respiratory:  Negative for cough and shortness of breath.   Cardiovascular:  Negative for chest pain.  Gastrointestinal:  Negative for abdominal pain, constipation, diarrhea, nausea and vomiting.  Genitourinary:  Negative for dysuria and urgency.  Musculoskeletal:  Negative for back pain and myalgias.  Neurological:  Negative for dizziness, weakness, light-headedness and headaches.  Psychiatric/Behavioral:  Negative for dysphoric mood. The patient is not nervous/anxious.     Current Outpatient Medications on File Prior to Visit  Medication Sig Dispense Refill   cetirizine  (ALLERGY RELIEF CETIRIZINE ) 10 MG tablet Take 1 tablet (10 mg total) by mouth at  bedtime. 90 tablet 3   ezetimibe  (ZETIA ) 10 MG tablet Take 1 tablet (10 mg total) by mouth at bedtime. 90 tablet 3   FLUoxetine  (PROZAC ) 40 MG capsule Take 1 capsule (40 mg total) by mouth daily. 90 capsule 3   fluticasone  (FLONASE ) 50 MCG/ACT nasal spray USE 2 SPRAYS INTO EACH NOSTRIL ONCE DAILY *NEW PRESCRIPTION REQUEST* 48 g 3   ibuprofen  (ADVIL ) 200 MG tablet Take 200 mg by mouth every 8 (eight) hours as needed for moderate pain (pain score 4-6).     levothyroxine  (SYNTHROID ) 25 MCG tablet Take 1 tablet (25 mcg total) by mouth daily before breakfast. 90 tablet 3   Melatonin 10 MG CAPS Take 10 mg by mouth daily as needed (sleep).     Omega-3 Fatty Acids (FISH OIL) 1000 MG CAPS Take 1,000 mg by mouth 2 (two) times  daily.     omeprazole  (PRILOSEC) 20 MG capsule Take 1 capsule (20 mg total) by mouth daily. 90 capsule 3   polyethylene glycol (MIRALAX  / GLYCOLAX ) 17 g packet Take 17 g by mouth daily as needed for mild constipation. Available OTC. 30 each 0   rosuvastatin  (CRESTOR ) 40 MG tablet TAKE 1 TABLET BY MOUTH EVERY DAY AT BEDTIME 30 tablet 10   sacubitril-valsartan (ENTRESTO ) 24-26 MG TAKE 1 TABLET BY MOUTH IN THE MORNING AND 1 AT BEDTIME *NEW PRESCRIPTION REQUEST* 60 tablet 11   Vitamin D , Cholecalciferol , 25 MCG (1000 UT) CAPS Take 1,000 Units by mouth daily.     No current facility-administered medications on file prior to visit.   Past Medical History:  Diagnosis Date   Abnormal nuclear stress test 08/08/2020   Abnormal nuclear stress test 08/08/2020   Anxiety    Closed fracture of distal end of left radius 08/16/2020   Depressed left ventricular ejection fraction    Depression    Diabetes mellitus without complication (HCC)    Dilated cardiomyopathy (HCC) 08/08/2020   Encounter for Medicare annual wellness exam 10/19/2019   Gastroesophageal reflux disease 10/19/2019   GERD (gastroesophageal reflux disease)    Hydropneumothorax 02/08/2021   Hyperlipidemia    Hyperlipidemia associated with type 2 diabetes mellitus (HCC) 10/19/2019   LBBB (left bundle branch block) 08/17/2020   Mitral valve prolapse    Nonischemic cardiomyopathy (HCC) 08/17/2020   Osteopenia 10/19/2019   Sinusitis 08/16/2019   Upper respiratory tract infection due to COVID-19 virus 03/18/2021   Past Surgical History:  Procedure Laterality Date   BIV ICD INSERTION CRT-D N/A 02/05/2021   Procedure: BIV ICD INSERTION CRT-D;  Surgeon: Lei Pump, MD;  Location: [facility] [place] INVASIVE CV LAB;  Service: Cardiovascular;  Laterality: N/A;   BREAST LUMPECTOMY Left 2010   CATARACT EXTRACTION BILATERAL W/ ANTERIOR VITRECTOMY  2021   EYE SURGERY Bilateral    Cataract Removal   LEFT HEART CATH AND CORONARY ANGIOGRAPHY N/A  08/08/2020   Procedure: LEFT HEART CATH AND CORONARY ANGIOGRAPHY;  Surgeon: Arleen Lacer, MD;  Location: [facility] INVASIVE CV LAB;  Service: Cardiovascular;  Laterality: N/A;    Family History  Problem Relation Age of Onset   Osteoporosis Mother    Heart disease Father    Hyperlipidemia Father    Coronary artery disease Father    Diabetes Sister    Breast cancer Daughter    Diabetes Maternal Grandfather    Social History   Socioeconomic History   Marital status: Married    Spouse name: Landon Pinion   Number of children: 2   Years of education: Not on  file   Highest education level: Master's degree (e.g., MA, MS, MEng, MEd, MSW, MBA)  Occupational History   Occupation: Retired  Tobacco Use   Smoking status: Never   Smokeless tobacco: Never  Vaping Use   Vaping status: Never Used  Substance and Sexual Activity   Alcohol use: Yes    Alcohol/week: 2.0 standard drinks of alcohol    Types: 2 Glasses of wine per week    Comment: occasional (white wine)   Drug use: Never   Sexual activity: Not Currently    Partners: Male  Other Topics Concern   Not on file  Social History Narrative   Son lives in [place], Daughter lives in [place]   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   Social Drivers of Health   Financial Resource Strain: Low Risk  (11/04/2022)   Overall Financial Resource Strain (CARDIA)    Difficulty of Paying Living Expenses: Not very hard  Food Insecurity: No Food Insecurity (04/09/2023)   Hunger Vital Sign    Worried About Running Out of Food in the Last Year: Never true    Ran Out of Food in the Last Year: Never true  Transportation Needs: No Transportation Needs (04/09/2023)   PRAPARE - Administrator, Civil Service (Medical): No    Lack of Transportation (Non-Medical): No  Physical Activity: Sufficiently Active (11/04/2022)   Exercise Vital Sign    Days of Exercise per Week: 6 days    Minutes of Exercise per Session: 30 min  Stress: No  Stress Concern Present (11/04/2022)   [employer] of Occupational Health - Occupational Stress Questionnaire    Feeling of Stress : Not at all  Social Connections: Socially Integrated (04/03/2023)   Social Connection and Isolation Panel [NHANES]    Frequency of Communication with Friends and Family: Three times a week    Frequency of Social Gatherings with Friends and Family: Three times a week    Attends Religious Services: 1 to 4 times per year    Active Member of Clubs or Organizations: Yes    Attends Banker Meetings: 1 to 4 times per year    Marital Status: Married    Objective:  BP 128/74   Pulse (!) 54   Temp 98 F (36.7 C)   Ht 5' 6.5" (1.689 m)   Wt 137 lb (62.1 kg)   SpO2 93%   BMI 21.78 kg/m      07/25/2023    8:52 AM 04/28/2023   10:54 AM 04/10/2023    3:35 PM  BP/Weight  Systolic BP 128 100 106  Diastolic BP 74 66 78  Wt. (Lbs) 137 135 135.4  BMI 21.78 kg/m2 21.46 kg/m2 21.53 kg/m2    Physical Exam Vitals reviewed.  Constitutional:      Appearance: Normal appearance. She is normal weight.  Neck:     Vascular: No carotid bruit.  Cardiovascular:     Rate and Rhythm: Normal rate and regular rhythm.     Heart sounds: Normal heart sounds.  Pulmonary:     Effort: Pulmonary effort is normal. No respiratory distress.     Breath sounds: Normal breath sounds.  Abdominal:     General: Abdomen is flat. Bowel sounds are normal.     Palpations: Abdomen is soft.     Tenderness: There is no abdominal tenderness.  Neurological:     Mental Status: She is alert and oriented to person, place, and time.  Psychiatric:  Mood and Affect: Mood normal.        Behavior: Behavior normal.     Diabetic Foot Exam - Simple   No data filed      Lab Results  Component Value Date   WBC 7.0 04/04/2023   HGB 12.7 04/04/2023   HCT 38.2 04/04/2023   PLT 254 04/04/2023   GLUCOSE 86 04/10/2023   CHOL 155 12/30/2022   TRIG 67 12/30/2022   HDL 86  12/30/2022   LDLCALC 56 12/30/2022   ALT 18 04/10/2023   AST 23 04/10/2023   NA 135 04/10/2023   K 4.8 04/10/2023   CL 101 04/10/2023   CREATININE 0.86 04/10/2023   BUN 12 04/10/2023   CO2 20 04/10/2023   TSH 1.639 09/13/2022   HGBA1C 5.6 12/30/2022      Assessment & Plan:  Gastroesophageal reflux disease without esophagitis Assessment & Plan: Well controlled.  No changes to medicines. Omeprazole  20 mg daily. Continue to work on eating a healthy diet and exercise as tolerated.   Other specified hypothyroidism Assessment & Plan: Previously well controlled Continue Synthroid  at current dose  Recheck TSH and adjust Synthroid  as indicated    Orders: -     TSH  Chronic systolic congestive heart failure [facility]) Assessment & Plan: Management per Specialist The medical regimen is effective. Continue on Entresto  24-26 mg daily    Mixed hyperlipidemia Assessment & Plan: Well controlled.  No changes to medicines. Zetia  10 mg daily, rosuvastatin  40 mg daily, omega 3 fish oil 1000 mg TWICE A DAY  Continue to work on eating a healthy diet and exercise as tolerated Labs reviewed today   Orders: -     CBC with Differential/Platelet -     Comprehensive metabolic panel with GFR -     Lipid panel  Prediabetes Assessment & Plan: Hemoglobin A1c 5.6%, 3 month avg of blood sugars, is in prediabetic range.  In order to prevent progression to diabetes, recommend low carb diet and regular exercise   Orders: -     Hemoglobin A1c  Presence of heart assist device [facility]) Assessment & Plan: Management per specialist.        No orders of the defined types were placed in this encounter.   Orders Placed This Encounter  Procedures   CBC with Differential/Platelet   Comprehensive metabolic panel with GFR   Hemoglobin A1c   Lipid panel   TSH     Follow-up: Return in about 6 months (around 01/24/2024) for chronic follow up.   I,Marla I Leal-Borjas,acting as a scribe for  Mercy Stall, MD.,have documented all relevant documentation on the behalf of Mercy Stall, MD,as directed by  Mercy Stall, MD while in the presence of Mercy Stall, MD.   An After Visit Summary was printed and given to the patient.  I attest that I have reviewed this visit and agree with the plan scribed by my staff.   Mercy Stall, MD [facility] [phone]

## 2023-07-25 ENCOUNTER — Ambulatory Visit (INDEPENDENT_AMBULATORY_CARE_PROVIDER_SITE_OTHER): Admitting: Family Medicine

## 2023-07-25 ENCOUNTER — Encounter: Payer: Self-pay | Admitting: Family Medicine

## 2023-07-25 VITALS — BP 128/74 | HR 54 | Temp 98.0°F | Ht 66.5 in | Wt 137.0 lb

## 2023-07-25 DIAGNOSIS — E782 Mixed hyperlipidemia: Secondary | ICD-10-CM

## 2023-07-25 DIAGNOSIS — E038 Other specified hypothyroidism: Secondary | ICD-10-CM

## 2023-07-25 DIAGNOSIS — R7303 Prediabetes: Secondary | ICD-10-CM

## 2023-07-25 DIAGNOSIS — E119 Type 2 diabetes mellitus without complications: Secondary | ICD-10-CM

## 2023-07-25 DIAGNOSIS — K219 Gastro-esophageal reflux disease without esophagitis: Secondary | ICD-10-CM

## 2023-07-25 DIAGNOSIS — I5022 Chronic systolic (congestive) heart failure: Secondary | ICD-10-CM | POA: Diagnosis not present

## 2023-07-25 DIAGNOSIS — Z95811 Presence of heart assist device: Secondary | ICD-10-CM | POA: Diagnosis not present

## 2023-07-25 NOTE — Assessment & Plan Note (Deleted)
 Previously well controlled Continue Synthroid at current dose

## 2023-07-25 NOTE — Assessment & Plan Note (Signed)
 Well controlled.  No changes to medicines. Zetia 10 mg daily, rosuvastatin 40 mg daily, omega 3 fish oil 1000 mg TWICE A DAY  Continue to work on eating a healthy diet and exercise as tolerated Labs reviewed today

## 2023-07-25 NOTE — Assessment & Plan Note (Signed)
 Well controlled.  No changes to medicines. Omeprazole 20 mg daily. Continue to work on eating a healthy diet and exercise as tolerated.

## 2023-07-25 NOTE — Assessment & Plan Note (Signed)
 Management per Specialist The medical regimen is effective. Continue on Entresto 24-26 mg daily

## 2023-07-27 DIAGNOSIS — Z95811 Presence of heart assist device: Secondary | ICD-10-CM | POA: Insufficient documentation

## 2023-07-27 NOTE — Assessment & Plan Note (Signed)
 Previously well controlled Continue Synthroid at current dose  Recheck TSH and adjust Synthroid as indicated

## 2023-07-27 NOTE — Assessment & Plan Note (Signed)
Hemoglobin A1c 5.6%, 3 month avg of blood sugars, is in prediabetic range.  In order to prevent progression to diabetes, recommend low carb diet and regular exercise  

## 2023-07-27 NOTE — Assessment & Plan Note (Signed)
 Management per specialist.

## 2023-07-31 ENCOUNTER — Ambulatory Visit: Payer: Self-pay | Admitting: Family Medicine

## 2023-07-31 DIAGNOSIS — Z1231 Encounter for screening mammogram for malignant neoplasm of breast: Secondary | ICD-10-CM | POA: Diagnosis not present

## 2023-07-31 LAB — CBC WITH DIFFERENTIAL/PLATELET
Basophils Absolute: 0.1 10*3/uL (ref 0.0–0.2)
Basos: 1 %
EOS (ABSOLUTE): 0.2 10*3/uL (ref 0.0–0.4)
Eos: 4 %
Hematocrit: 36.6 % (ref 34.0–46.6)
Hemoglobin: 11.7 g/dL (ref 11.1–15.9)
Immature Grans (Abs): 0 10*3/uL (ref 0.0–0.1)
Immature Granulocytes: 0 %
Lymphocytes Absolute: 1.9 10*3/uL (ref 0.7–3.1)
Lymphs: 36 %
MCH: 31.5 pg (ref 26.6–33.0)
MCHC: 32 g/dL (ref 31.5–35.7)
MCV: 98 fL — ABNORMAL HIGH (ref 79–97)
Monocytes Absolute: 0.3 10*3/uL (ref 0.1–0.9)
Monocytes: 6 %
Neutrophils Absolute: 2.8 10*3/uL (ref 1.4–7.0)
Neutrophils: 53 %
Platelets: 235 10*3/uL (ref 150–450)
RBC: 3.72 x10E6/uL — ABNORMAL LOW (ref 3.77–5.28)
RDW: 12.3 % (ref 11.7–15.4)
WBC: 5.3 10*3/uL (ref 3.4–10.8)

## 2023-07-31 LAB — COMPREHENSIVE METABOLIC PANEL WITH GFR
ALT: 11 IU/L (ref 0–32)
AST: 21 IU/L (ref 0–40)
Albumin: 4.6 g/dL (ref 3.8–4.8)
Alkaline Phosphatase: 58 IU/L (ref 44–121)
BUN/Creatinine Ratio: 24 (ref 12–28)
BUN: 19 mg/dL (ref 8–27)
Bilirubin Total: 0.2 mg/dL (ref 0.0–1.2)
CO2: 16 mmol/L — ABNORMAL LOW (ref 20–29)
Calcium: 10.5 mg/dL — ABNORMAL HIGH (ref 8.7–10.3)
Chloride: 106 mmol/L (ref 96–106)
Creatinine, Ser: 0.8 mg/dL (ref 0.57–1.00)
Globulin, Total: 2.1 g/dL (ref 1.5–4.5)
Glucose: 93 mg/dL (ref 70–99)
Potassium: 5.1 mmol/L (ref 3.5–5.2)
Sodium: 143 mmol/L (ref 134–144)
Total Protein: 6.7 g/dL (ref 6.0–8.5)
eGFR: 77 mL/min/{1.73_m2} (ref >59)

## 2023-07-31 LAB — HEMOGLOBIN A1C
Est. average glucose Bld gHb Est-mCnc: 120 mg/dL
Hgb A1c MFr Bld: 5.8 % — ABNORMAL HIGH (ref 4.8–5.6)

## 2023-07-31 LAB — LIPID PANEL
Chol/HDL Ratio: 2 ratio (ref 0.0–4.4)
Cholesterol, Total: 129 mg/dL (ref 100–199)
HDL: 66 mg/dL (ref >39)
LDL Chol Calc (NIH): 46 mg/dL (ref 0–99)
Triglycerides: 89 mg/dL (ref 0–149)
VLDL Cholesterol Cal: 17 mg/dL (ref 5–40)

## 2023-07-31 LAB — TSH: TSH: 3.11 u[IU]/mL (ref 0.450–4.500)

## 2023-07-31 LAB — HM MAMMOGRAPHY

## 2023-08-02 NOTE — Progress Notes (Unsigned)
  Electrophysiology Office Note:   Date:  08/02/2023  ID:  [employer] Audrey Ruiz, DOB [birth year], MRN [id number]  Primary Cardiologist: Ralene Burger, MD Primary Heart Failure: None Electrophysiologist: Jeanean Hollett Cortland Ding, MD ***     History of Present Illness:   Audrey Ruiz is a 73 y.o. female with h/o chronic systolic heart failure, diabetes, hyperlipidemia, breast cancer seen today for routine electrophysiology followup.   Since last being seen in our clinic the patient reports doing ***.  she denies chest pain, palpitations, dyspnea, PND, orthopnea, nausea, vomiting, dizziness, syncope, edema, weight gain, or early satiety.   Review of systems complete and found to be negative unless listed in HPI.      EP Information / Studies Reviewed:    {EKGtoday:28818}      ICD Interrogation-  reviewed in detail today,  See PACEART report.  Device History: Abbott BiV ICD implanted 02/05/2021 for chronic systolic heart failure History of appropriate therapy: No History of AAD therapy: No   Risk Assessment/Calculations:           Physical Exam:   VS:  There were no vitals taken for this visit.   Wt Readings from Last 3 Encounters:  07/25/23 137 lb (62.1 kg)  04/28/23 135 lb (61.2 kg)  04/10/23 135 lb 6.4 oz (61.4 kg)     GEN: Well nourished, well developed in no acute distress NECK: No JVD; No carotid bruits CARDIAC: {EPRHYTHM:28826}, no murmurs, rubs, gallops RESPIRATORY:  Clear to auscultation without rales, wheezing or rhonchi  ABDOMEN: Soft, non-tender, non-distended EXTREMITIES:  No edema; No deformity   ASSESSMENT AND PLAN:    Chronic systolic dysfunction s/p Abbott CRT-D  euvolemic today Stable on an appropriate medical regimen Normal ICD function Sensing, threshold, impedance within normal limits Programming appropriate See Pace Art report No changes today  2.  Hyperlipidemia: Continue statin per primary cardiology  Disposition:    Follow up with {EPPROVIDERS:28135::EP Team} {EPFOLLOW FA:21308}   Signed, Kamariyah Timberlake Cortland Ding, MD

## 2023-08-04 ENCOUNTER — Other Ambulatory Visit: Payer: Self-pay | Admitting: Oncology

## 2023-08-04 ENCOUNTER — Ambulatory Visit: Payer: PPO | Attending: Cardiology | Admitting: Cardiology

## 2023-08-04 ENCOUNTER — Encounter: Payer: Self-pay | Admitting: Cardiology

## 2023-08-04 VITALS — BP 100/62 | HR 74 | Ht 67.0 in | Wt 131.8 lb

## 2023-08-04 DIAGNOSIS — D649 Anemia, unspecified: Secondary | ICD-10-CM

## 2023-08-04 DIAGNOSIS — E785 Hyperlipidemia, unspecified: Secondary | ICD-10-CM | POA: Diagnosis not present

## 2023-08-04 DIAGNOSIS — I5022 Chronic systolic (congestive) heart failure: Secondary | ICD-10-CM | POA: Diagnosis not present

## 2023-08-04 LAB — CUP PACEART INCLINIC DEVICE CHECK
Battery Remaining Longevity: 62 mo
Brady Statistic RA Percent Paced: 0.56 %
Brady Statistic RV Percent Paced: 96 %
Date Time Interrogation Session: 20250616135829
HighPow Impedance: 65.25 Ohm
Implantable Lead Connection Status: 753985
Implantable Lead Connection Status: 753985
Implantable Lead Connection Status: 753985
Implantable Lead Implant Date: 20221219
Implantable Lead Implant Date: 20221219
Implantable Lead Implant Date: 20221219
Implantable Lead Location: 753858
Implantable Lead Location: 753859
Implantable Lead Location: 753860
Implantable Pulse Generator Implant Date: 20221219
Lead Channel Impedance Value: 325 Ohm
Lead Channel Impedance Value: 412.5 Ohm
Lead Channel Impedance Value: 650 Ohm
Lead Channel Pacing Threshold Amplitude: 0.75 V
Lead Channel Pacing Threshold Amplitude: 0.75 V
Lead Channel Pacing Threshold Amplitude: 0.75 V
Lead Channel Pacing Threshold Amplitude: 0.75 V
Lead Channel Pacing Threshold Amplitude: 1 V
Lead Channel Pacing Threshold Amplitude: 1 V
Lead Channel Pacing Threshold Pulse Width: 0.5 ms
Lead Channel Pacing Threshold Pulse Width: 0.5 ms
Lead Channel Pacing Threshold Pulse Width: 0.5 ms
Lead Channel Pacing Threshold Pulse Width: 0.5 ms
Lead Channel Pacing Threshold Pulse Width: 0.5 ms
Lead Channel Pacing Threshold Pulse Width: 0.5 ms
Lead Channel Sensing Intrinsic Amplitude: 1.4 mV
Lead Channel Sensing Intrinsic Amplitude: 12 mV
Lead Channel Setting Pacing Amplitude: 1.875
Lead Channel Setting Pacing Amplitude: 2 V
Lead Channel Setting Pacing Amplitude: 2 V
Lead Channel Setting Pacing Pulse Width: 0.5 ms
Lead Channel Setting Pacing Pulse Width: 0.5 ms
Lead Channel Setting Sensing Sensitivity: 0.5 mV
Pulse Gen Serial Number: 111053351
Zone Setting Status: 755011

## 2023-08-04 NOTE — Progress Notes (Signed)
 [facility]  [address] [place],  [place]  [address] [phone]  Clinic Day:  08/05/2023  Referring physician: Sherre Clapper, MD   HISTORY OF PRESENT ILLNESS:  The patient is a 73 y.o. woman who I follow for mild anemia.  She comes in today to see what her hemoglobin is currently.  At her last visit, her hemoglobin was decent at 11.6.  Extensive labs done in the past did not elucidate any obvious reason behind her anemia.  Of note, her TSH level did come back mildly elevated recently, for which she is now on levothyroxine  for hypothyroidism.  She denies having increased fatigue or any overt forms of blood loss which concern for progressive anemia.  Of note, this patient was also diagnosed with stage IIA (T3 N0 M0) hormone positive breast cancer, status post a lumpectomy in November 2010. This was followed by adjuvant chemotherapy and radiation.  She also took anastrozole for 6+ years for her adjuvant endocrine management.  She denies having any changes in her breasts which concern her for disease recurrence.  Of note, her annual mammogram done last week showed no evidence of disease recurrence.  PHYSICAL EXAM:  Blood pressure (!) 112/58, pulse 68, temperature (!) 97.5 F (36.4 C), temperature source Oral, resp. rate 14, height 5' 7 (1.702 m), weight 134 lb (60.8 kg), SpO2 100%. Wt Readings from Last 3 Encounters:  08/05/23 134 lb (60.8 kg)  08/04/23 131 lb 12.8 oz (59.8 kg)  07/25/23 137 lb (62.1 kg)   Body mass index is 20.99 kg/m. Performance status (ECOG): 0 - Asymptomatic  Physical Exam Constitutional:      Appearance: Normal appearance. She is not ill-appearing.  HENT:     Mouth/Throat:     Mouth: Mucous membranes are moist.     Pharynx: Oropharynx is clear. No oropharyngeal exudate or posterior oropharyngeal erythema.   Cardiovascular:     Rate and Rhythm: Normal rate and regular rhythm.     Heart sounds: No murmur heard.    No  friction rub. No gallop.  Pulmonary:     Effort: Pulmonary effort is normal. No respiratory distress.     Breath sounds: Normal breath sounds. No wheezing, rhonchi or rales.  Chest:  Breasts:    Right: No swelling, bleeding, inverted nipple, mass, nipple discharge or skin change.     Left: No swelling, bleeding, inverted nipple, mass, nipple discharge or skin change.  Abdominal:     General: Bowel sounds are normal. There is no distension.     Palpations: Abdomen is soft. There is no mass.     Tenderness: There is no abdominal tenderness.   Musculoskeletal:        General: No swelling or tenderness.     Cervical back: Normal range of motion and neck supple.     Right lower leg: No edema.     Left lower leg: No edema.  Lymphadenopathy:     Cervical: No cervical adenopathy.     Right cervical: No superficial, deep or posterior cervical adenopathy.    Left cervical: No superficial, deep or posterior cervical adenopathy.     Upper Body:     Right upper body: No supraclavicular or axillary adenopathy.     Left upper body: No supraclavicular or axillary adenopathy.     Lower Body: No right inguinal adenopathy. No left inguinal adenopathy.   Skin:    General: Skin is warm.     Coloration: Skin is not jaundiced.  Findings: No lesion or rash.   Neurological:     General: No focal deficit present.     Mental Status: She is alert and oriented to person, place, and time. Mental status is at baseline.   Psychiatric:        Mood and Affect: Mood normal.        Behavior: Behavior normal.        Thought Content: Thought content normal.        Judgment: Judgment normal.    LABS:    Latest Reference Range & Units 08/05/23 10:07  WBC 4.0 - 10.5 K/uL 6.1  RBC 3.87 - 5.11 MIL/uL 3.77 (L)  Hemoglobin 12.0 - 15.0 g/dL 88.4 (L)  HCT 63.9 - 53.9 % 36.9  MCV 80.0 - 100.0 fL 97.9  MCH 26.0 - 34.0 pg 30.5  MCHC 30.0 - 36.0 g/dL 68.7  RDW 88.4 - 84.4 % 13.4  Platelets 150 - 400 K/uL 239   nRBC 0.0 - 0.2 % 0.0  Neutrophils % 51  Lymphocytes % 36  Monocytes Relative % 8  Eosinophil % 4  Basophil % 1  Immature Granulocytes % 0  (L): Data is abnormally low   Latest Reference Range & Units 08/05/23 10:07  Iron 28 - 170 ug/dL 70  UIBC ug/dL 748  TIBC 749 - 549 ug/dL 678  Saturation Ratios 10.4 - 31.8 % 22  Ferritin 11 - 307 ng/mL 113  Folate >5.9 ng/mL 28.4  Vitamin B12 180 - 914 pg/mL 332    ASSESSMENT & PLAN:  Assessment/Plan:  A 72 y.o. woman who I follow for stage IIA (T3 N0 M0) hormone positive breast cancer, status post a lumpectomy in November 2010.  She is also followed for mild anemia.  Based on the clinical with them today her mammogram, the patient remains disease-free.  With respect to her anemia, it remains mild.  There remains no evidence of any nutritional deficiencies factoring into her anemia.  Clinically, the patient appears to be doing very well.  As is the case, her mild anemia will continue to be followed conservatively.  I will see her back in 1 year for repeat clinical assessment.  I will ensure her annual mammogram is done before her next visit for her continued radiographic breast cancer surveillance.  The patient understands all the plans discussed today and is in agreement with them.  Kameria Canizares DELENA Kerns, MD

## 2023-08-04 NOTE — Patient Instructions (Addendum)
 Medication Instructions:  Your physician recommends that you continue on your current medications as directed. Please refer to the Current Medication list given to you today.  *If you need a refill on your cardiac medications before your next appointment, please call your pharmacy*  Lab Work: None ordered   Testing/Procedures: None ordered  Follow-Up: At [facility], you and your health needs are our priority.  As part of our continuing mission to provide you with exceptional heart care, our providers are all part of one team.  This team includes your primary Cardiologist (physician) and Advanced Practice Providers or APPs (Physician Assistants and Nurse Practitioners) who all work together to provide you with the care you need, when you need it.  Remote monitoring is used to monitor your Pacemaker or ICD from home. This monitoring reduces the number of office visits required to check your device to one time per year. It allows us  to keep an eye on the functioning of your device to ensure it is working properly. You are scheduled for a device check from home on 11/04/2023. You may send your transmission at any time that day. If you have a wireless device, the transmission will be sent automatically. After your physician reviews your transmission, you will receive a postcard with your next transmission date.   Your next appointment:   1 year(s)  Provider:   Agatha Horsfall, MD     Thank you for choosing [facility]!!   Reece Cane, RN [phone]

## 2023-08-05 ENCOUNTER — Inpatient Hospital Stay: Attending: Oncology | Admitting: Oncology

## 2023-08-05 ENCOUNTER — Telehealth: Payer: Self-pay

## 2023-08-05 ENCOUNTER — Inpatient Hospital Stay

## 2023-08-05 ENCOUNTER — Other Ambulatory Visit: Payer: Self-pay

## 2023-08-05 ENCOUNTER — Other Ambulatory Visit: Payer: Self-pay | Admitting: Oncology

## 2023-08-05 ENCOUNTER — Ambulatory Visit (INDEPENDENT_AMBULATORY_CARE_PROVIDER_SITE_OTHER): Payer: PPO

## 2023-08-05 VITALS — BP 112/58 | HR 68 | Temp 97.5°F | Resp 14 | Ht 67.0 in | Wt 134.0 lb

## 2023-08-05 DIAGNOSIS — Z79890 Hormone replacement therapy (postmenopausal): Secondary | ICD-10-CM | POA: Insufficient documentation

## 2023-08-05 DIAGNOSIS — Z853 Personal history of malignant neoplasm of breast: Secondary | ICD-10-CM | POA: Insufficient documentation

## 2023-08-05 DIAGNOSIS — Z9221 Personal history of antineoplastic chemotherapy: Secondary | ICD-10-CM | POA: Insufficient documentation

## 2023-08-05 DIAGNOSIS — E039 Hypothyroidism, unspecified: Secondary | ICD-10-CM | POA: Insufficient documentation

## 2023-08-05 DIAGNOSIS — D649 Anemia, unspecified: Secondary | ICD-10-CM

## 2023-08-05 DIAGNOSIS — Z923 Personal history of irradiation: Secondary | ICD-10-CM | POA: Diagnosis not present

## 2023-08-05 DIAGNOSIS — I428 Other cardiomyopathies: Secondary | ICD-10-CM | POA: Diagnosis not present

## 2023-08-05 LAB — CUP PACEART REMOTE DEVICE CHECK
Battery Remaining Longevity: 61 mo
Battery Remaining Percentage: 66 %
Battery Voltage: 2.96 V
Brady Statistic AP VP Percent: 1 %
Brady Statistic AP VS Percent: 1 %
Brady Statistic AS VP Percent: 99 %
Brady Statistic AS VS Percent: 1 %
Brady Statistic RA Percent Paced: 1 %
Date Time Interrogation Session: 20250617020029
HighPow Impedance: 62 Ohm
Implantable Lead Connection Status: 753985
Implantable Lead Connection Status: 753985
Implantable Lead Connection Status: 753985
Implantable Lead Implant Date: 20221219
Implantable Lead Implant Date: 20221219
Implantable Lead Implant Date: 20221219
Implantable Lead Location: 753858
Implantable Lead Location: 753859
Implantable Lead Location: 753860
Implantable Pulse Generator Implant Date: 20221219
Lead Channel Impedance Value: 300 Ohm
Lead Channel Impedance Value: 390 Ohm
Lead Channel Impedance Value: 640 Ohm
Lead Channel Pacing Threshold Amplitude: 0.75 V
Lead Channel Pacing Threshold Amplitude: 0.75 V
Lead Channel Pacing Threshold Amplitude: 1.5 V
Lead Channel Pacing Threshold Pulse Width: 0.5 ms
Lead Channel Pacing Threshold Pulse Width: 0.5 ms
Lead Channel Pacing Threshold Pulse Width: 0.5 ms
Lead Channel Sensing Intrinsic Amplitude: 1.2 mV
Lead Channel Sensing Intrinsic Amplitude: 12 mV
Lead Channel Setting Pacing Amplitude: 1.75 V
Lead Channel Setting Pacing Amplitude: 2 V
Lead Channel Setting Pacing Amplitude: 2 V
Lead Channel Setting Pacing Pulse Width: 0.5 ms
Lead Channel Setting Pacing Pulse Width: 0.5 ms
Lead Channel Setting Sensing Sensitivity: 0.5 mV
Pulse Gen Serial Number: 111053351
Zone Setting Status: 755011

## 2023-08-05 LAB — IRON AND TIBC
Iron: 70 ug/dL (ref 28–170)
Saturation Ratios: 22 % (ref 10.4–31.8)
TIBC: 321 ug/dL (ref 250–450)
UIBC: 251 ug/dL

## 2023-08-05 LAB — FERRITIN: Ferritin: 113 ng/mL (ref 11–307)

## 2023-08-05 LAB — CBC WITH DIFFERENTIAL (CANCER CENTER ONLY)
Abs Immature Granulocytes: 0.01 10*3/uL (ref 0.00–0.07)
Basophils Absolute: 0.1 10*3/uL (ref 0.0–0.1)
Basophils Relative: 1 %
Eosinophils Absolute: 0.2 10*3/uL (ref 0.0–0.5)
Eosinophils Relative: 4 %
HCT: 36.9 % (ref 36.0–46.0)
Hemoglobin: 11.5 g/dL — ABNORMAL LOW (ref 12.0–15.0)
Immature Granulocytes: 0 %
Lymphocytes Relative: 36 %
Lymphs Abs: 2.2 10*3/uL (ref 0.7–4.0)
MCH: 30.5 pg (ref 26.0–34.0)
MCHC: 31.2 g/dL (ref 30.0–36.0)
MCV: 97.9 fL (ref 80.0–100.0)
Monocytes Absolute: 0.5 10*3/uL (ref 0.1–1.0)
Monocytes Relative: 8 %
Neutro Abs: 3.1 10*3/uL (ref 1.7–7.7)
Neutrophils Relative %: 51 %
Platelet Count: 239 10*3/uL (ref 150–400)
RBC: 3.77 MIL/uL — ABNORMAL LOW (ref 3.87–5.11)
RDW: 13.4 % (ref 11.5–15.5)
WBC Count: 6.1 10*3/uL (ref 4.0–10.5)
nRBC: 0 % (ref 0.0–0.2)

## 2023-08-05 LAB — FOLATE: Folate: 28.4 ng/mL (ref >5.9)

## 2023-08-05 LAB — VITAMIN B12: Vitamin B-12: 332 pg/mL (ref 180–914)

## 2023-08-05 NOTE — Telephone Encounter (Addendum)
 Latest Reference Range & Units 08/05/23 10:07  Iron 28 - 170 ug/dL 70  UIBC ug/dL 409  TIBC 811 - 914 ug/dL 782  Saturation Ratios 10.4 - 31.8 % 22  Ferritin 11 - 307 ng/mL 113  Folate >5.9 ng/mL 28.4  Vitamin B12 180 - 914 pg/mL 332    08/05/23 - Pt requesting iron results, not ready as of 1557.

## 2023-08-07 ENCOUNTER — Ambulatory Visit: Payer: Self-pay | Admitting: Cardiology

## 2023-08-18 ENCOUNTER — Encounter: Payer: Self-pay | Admitting: Oncology

## 2023-09-23 DIAGNOSIS — L821 Other seborrheic keratosis: Secondary | ICD-10-CM | POA: Diagnosis not present

## 2023-09-23 DIAGNOSIS — D2239 Melanocytic nevi of other parts of face: Secondary | ICD-10-CM | POA: Diagnosis not present

## 2023-09-23 DIAGNOSIS — L814 Other melanin hyperpigmentation: Secondary | ICD-10-CM | POA: Diagnosis not present

## 2023-09-23 DIAGNOSIS — D225 Melanocytic nevi of trunk: Secondary | ICD-10-CM | POA: Diagnosis not present

## 2023-10-16 NOTE — Progress Notes (Signed)
 Remote ICD transmission.

## 2023-10-27 ENCOUNTER — Other Ambulatory Visit: Payer: Self-pay | Admitting: Cardiology

## 2023-11-01 ENCOUNTER — Other Ambulatory Visit: Payer: Self-pay | Admitting: Cardiology

## 2023-11-04 ENCOUNTER — Ambulatory Visit: Payer: PPO | Attending: Cardiology

## 2023-11-04 DIAGNOSIS — I428 Other cardiomyopathies: Secondary | ICD-10-CM

## 2023-11-05 ENCOUNTER — Telehealth: Payer: Self-pay | Admitting: Cardiology

## 2023-11-05 DIAGNOSIS — I5022 Chronic systolic (congestive) heart failure: Secondary | ICD-10-CM

## 2023-11-05 LAB — CUP PACEART REMOTE DEVICE CHECK
Battery Remaining Longevity: 60 mo
Battery Remaining Percentage: 64 %
Battery Voltage: 2.95 V
Brady Statistic AP VP Percent: 1 %
Brady Statistic AP VS Percent: 1 %
Brady Statistic AS VP Percent: 99 %
Brady Statistic AS VS Percent: 1 %
Brady Statistic RA Percent Paced: 1 %
Date Time Interrogation Session: 20250916020013
HighPow Impedance: 65 Ohm
Implantable Lead Connection Status: 753985
Implantable Lead Connection Status: 753985
Implantable Lead Connection Status: 753985
Implantable Lead Implant Date: 20221219
Implantable Lead Implant Date: 20221219
Implantable Lead Implant Date: 20221219
Implantable Lead Location: 753858
Implantable Lead Location: 753859
Implantable Lead Location: 753860
Implantable Pulse Generator Implant Date: 20221219
Lead Channel Impedance Value: 340 Ohm
Lead Channel Impedance Value: 410 Ohm
Lead Channel Impedance Value: 680 Ohm
Lead Channel Pacing Threshold Amplitude: 0.75 V
Lead Channel Pacing Threshold Amplitude: 0.75 V
Lead Channel Pacing Threshold Amplitude: 1.25 V
Lead Channel Pacing Threshold Pulse Width: 0.5 ms
Lead Channel Pacing Threshold Pulse Width: 0.5 ms
Lead Channel Pacing Threshold Pulse Width: 0.5 ms
Lead Channel Sensing Intrinsic Amplitude: 12 mV
Lead Channel Sensing Intrinsic Amplitude: 2.3 mV
Lead Channel Setting Pacing Amplitude: 1.75 V
Lead Channel Setting Pacing Amplitude: 1.75 V
Lead Channel Setting Pacing Amplitude: 2 V
Lead Channel Setting Pacing Pulse Width: 0.5 ms
Lead Channel Setting Pacing Pulse Width: 0.5 ms
Lead Channel Setting Sensing Sensitivity: 0.5 mV
Pulse Gen Serial Number: 111053351
Zone Setting Status: 755011

## 2023-11-05 MED ORDER — SACUBITRIL-VALSARTAN 24-26 MG PO TABS
ORAL_TABLET | ORAL | 11 refills | Status: AC
Start: 2023-11-05 — End: ?

## 2023-11-05 NOTE — Telephone Encounter (Signed)
*  STAT* If patient is at the pharmacy, call can be transferred to refill team.   1. Which medications need to be refilled? (please list name of each medication and dose if known)   sacubitril -valsartan  (ENTRESTO ) 24-26 MG   2. Would you like to learn more about the convenience, safety, & potential cost savings by using the [facility]?   3. Are you open to using the [facility] (Type [facility]. ).  4. Which pharmacy/location (including street and city if local pharmacy) is medication to be sent to?  [id number] - [place], [place] - [address]    5. Do they need a 30 day or 90 day supply?  90 day  Patient stated she is completely out of this medication.

## 2023-11-06 ENCOUNTER — Ambulatory Visit

## 2023-11-06 VITALS — Ht 67.0 in | Wt 130.0 lb

## 2023-11-06 DIAGNOSIS — Z0000 Encounter for general adult medical examination without abnormal findings: Secondary | ICD-10-CM | POA: Diagnosis not present

## 2023-11-06 DIAGNOSIS — M8580 Other specified disorders of bone density and structure, unspecified site: Secondary | ICD-10-CM

## 2023-11-06 NOTE — Progress Notes (Signed)
 Subjective:   Audrey Ruiz is a 73 y.o. female who presents for Medicare Annual (Subsequent) preventive examination.  Visit Complete: Virtual I connected with  Audrey Ruiz on 11/06/23 by a audio enabled telemedicine application and verified that I am speaking with the correct person using two identifiers.  Patient Location: Home  Provider Location: Office/Clinic  I discussed the limitations of evaluation and management by telemedicine. The patient expressed understanding and agreed to proceed.  Vital Signs: Because this visit was a virtual/telehealth visit, some criteria may be missing or patient reported. Any vitals not documented were not able to be obtained and vitals that have been documented are patient reported.        Objective:    There were no vitals filed for this visit. There is no height or weight on file to calculate BMI.     08/05/2023   11:12 AM 04/03/2023    7:41 PM 04/03/2023    7:00 PM 04/03/2023    1:49 PM 05/14/2022   10:49 AM 04/02/2022   11:39 AM 02/05/2021   10:42 AM  Advanced Directives  Does Patient Have a Medical Advance Directive? Yes   Yes Yes No Yes  Type of Estate agent of [place];Living will  Healthcare Power of [employer] Power of [employer] Power of Attorney  Does patient want to make changes to medical advance directive?       No - Patient declined  Copy of Healthcare Power of Attorney in Chart?  No - copy requested     No - copy requested    Current Medications (verified) Outpatient Encounter Medications as of 11/06/2023  Medication Sig   cetirizine  (ALLERGY RELIEF CETIRIZINE ) 10 MG tablet Take 1 tablet (10 mg total) by mouth at bedtime.   ezetimibe  (ZETIA ) 10 MG tablet Take 1 tablet (10 mg total) by mouth at bedtime.   ferrous sulfate (SLOW IRON) 160 (50 Fe) MG TBCR SR tablet Take 1 tablet by mouth daily.   FLUoxetine  (PROZAC ) 40 MG capsule Take 1 capsule (40 mg total) by  mouth daily.   fluticasone  (FLONASE ) 50 MCG/ACT nasal spray USE 2 SPRAYS INTO EACH NOSTRIL ONCE DAILY *NEW PRESCRIPTION REQUEST*   ibuprofen  (ADVIL ) 200 MG tablet Take 200 mg by mouth every 8 (eight) hours as needed for moderate pain (pain score 4-6).   levothyroxine  (SYNTHROID ) 25 MCG tablet Take 1 tablet (25 mcg total) by mouth daily before breakfast.   Melatonin 10 MG CAPS Take 10 mg by mouth daily as needed (sleep).   Omega-3 Fatty Acids (FISH OIL) 1000 MG CAPS Take 1,000 mg by mouth 2 (two) times daily.   omeprazole  (PRILOSEC) 20 MG capsule Take 1 capsule (20 mg total) by mouth daily.   polyethylene glycol (MIRALAX  / GLYCOLAX ) 17 g packet Take 17 g by mouth daily as needed for mild constipation. Available OTC.   rosuvastatin  (CRESTOR ) 40 MG tablet TAKE 1 TABLET BY MOUTH EVERY DAY AT BEDTIME   sacubitril -valsartan  (ENTRESTO ) 24-26 MG TAKE 1 TABLET BY MOUTH IN THE MORNING AND 1 AT BEDTIME   Vitamin D , Cholecalciferol , 25 MCG (1000 UT) CAPS Take 1,000 Units by mouth daily.   No facility-administered encounter medications on file as of 11/06/2023.    Allergies (verified) Patient has no known allergies.   History: Past Medical History:  Diagnosis Date   Abnormal nuclear stress test 08/08/2020   Abnormal nuclear stress test 08/08/2020   Anxiety    Closed fracture of  distal end of left radius 08/16/2020   Depressed left ventricular ejection fraction    Depression    Diabetes mellitus without complication (HCC)    Dilated cardiomyopathy (HCC) 08/08/2020   Encounter for Medicare annual wellness exam 10/19/2019   Gastroesophageal reflux disease 10/19/2019   GERD (gastroesophageal reflux disease)    Hydropneumothorax 02/08/2021   Hyperlipidemia    Hyperlipidemia associated with type 2 diabetes mellitus (HCC) 10/19/2019   LBBB (left bundle branch block) 08/17/2020   Mitral valve prolapse    Nonischemic cardiomyopathy (HCC) 08/17/2020   Osteopenia 10/19/2019   Sinusitis 08/16/2019    Upper respiratory tract infection due to COVID-19 virus 03/18/2021   Past Surgical History:  Procedure Laterality Date   BIV ICD INSERTION CRT-D N/A 02/05/2021   Procedure: BIV ICD INSERTION CRT-D;  Surgeon: Inocencio Soyla Lunger, MD;  Location: [facility] INVASIVE CV LAB;  Service: Cardiovascular;  Laterality: N/A;   BREAST LUMPECTOMY Left 2010   CATARACT EXTRACTION BILATERAL W/ ANTERIOR VITRECTOMY  2021   EYE SURGERY Bilateral    Cataract Removal   LEFT HEART CATH AND CORONARY ANGIOGRAPHY N/A 08/08/2020   Procedure: LEFT HEART CATH AND CORONARY ANGIOGRAPHY;  Surgeon: Anner Alm ORN, MD;  Location: [facility] INVASIVE CV LAB;  Service: Cardiovascular;  Laterality: N/A;   Family History  Problem Relation Age of Onset   Osteoporosis Mother    Heart disease Father    Hyperlipidemia Father    Coronary artery disease Father    Diabetes Sister    Breast cancer Daughter    Diabetes Maternal Grandfather    Social History   Socioeconomic History   Marital status: Married    Spouse name: Elspeth   Number of children: 2   Years of education: Not on file   Highest education level: Master's degree (e.g., MA, MS, MEng, MEd, MSW, MBA)  Occupational History   Occupation: Retired  Tobacco Use   Smoking status: Never   Smokeless tobacco: Never  Vaping Use   Vaping status: Never Used  Substance and Sexual Activity   Alcohol use: Yes    Alcohol/week: 2.0 standard drinks of alcohol    Types: 2 Glasses of wine per week    Comment: occasional (white wine)   Drug use: Never   Sexual activity: Not Currently    Partners: Male  Other Topics Concern   Not on file  Social History Narrative   Son lives in [place], Daughter lives in [place]   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   Social Drivers of Health   Financial Resource Strain: Low Risk  (11/04/2022)   Overall Financial Resource Strain (CARDIA)    Difficulty of Paying Living Expenses: Not very hard  Food Insecurity: No Food  Insecurity (04/09/2023)   Hunger Vital Sign    Worried About Running Out of Food in the Last Year: Never true    Ran Out of Food in the Last Year: Never true  Transportation Needs: No Transportation Needs (04/09/2023)   PRAPARE - Administrator, Civil Service (Medical): No    Lack of Transportation (Non-Medical): No  Physical Activity: Sufficiently Active (11/04/2022)   Exercise Vital Sign    Days of Exercise per Week: 6 days    Minutes of Exercise per Session: 30 min  Stress: No Stress Concern Present (11/04/2022)   [employer] of Occupational Health - Occupational Stress Questionnaire    Feeling of Stress : Not at all  Social Connections: Socially Integrated (04/03/2023)  Social Connection and Isolation Panel    Frequency of Communication with Friends and Family: Three times a week    Frequency of Social Gatherings with Friends and Family: Three times a week    Attends Religious Services: 1 to 4 times per year    Active Member of Clubs or Organizations: Yes    Attends Banker Meetings: 1 to 4 times per year    Marital Status: Married    Tobacco Counseling Counseling given: Not Answered   Clinical Intake:                        Activities of Daily Living    04/03/2023    7:00 PM  In your present state of health, do you have any difficulty performing the following activities:  Hearing? 0  Vision? 0  Difficulty concentrating or making decisions? 0    Patient Care Team: Sherre Clapper, MD as PCP - General (Family Medicine) Inocencio Soyla Lunger, MD as PCP - Electrophysiology (Cardiology) Bernie Lamar PARAS, MD as PCP - Cardiology (Cardiology) Ezzard Valaria LABOR, MD as Consulting Physician (Oncology) Tobb, Kardie, DO as Consulting Physician (Cardiology) Marland Lucie Boot, [place] as Physician Assistant (Orthopedic Surgery) Erasmo Bernardino BRAVO, OD (Optometry) Bertrum Rosina HERO, RN as [facility] Care Management (General Practice) Lauro Shona LABOR, RN as Registered Nurse  Indicate any recent Medical Services you may have received from other than [facility] providers in the past year (date may be approximate).     Assessment:   This is a routine wellness examination for [place].  Hearing/Vision screen No results found.   Goals Addressed   None    Depression Screen    07/25/2023    9:27 AM 04/10/2023    3:40 PM 02/27/2023    2:14 PM 11/04/2022    2:26 PM 07/08/2022    1:59 PM 04/04/2022    8:52 AM 01/23/2022   10:27 AM  PHQ 2/9 Scores  PHQ - 2 Score 1 0 0 0 2 0 0  PHQ- 9 Score 4  0 0 3 0     Fall Risk    07/25/2023    9:31 AM 07/25/2023    8:57 AM 04/10/2023    3:40 PM 11/04/2022    1:49 PM 07/08/2022    1:58 PM  Fall Risk   Falls in the past year? 1 0 0 1 0  Number falls in past yr: 0 0 0 0 0  Injury with Fall? 0 0 0 0 0  Risk for fall due to : History of fall(s) No Fall Risks No Fall Risks No Fall Risks No Fall Risks  Follow up Falls evaluation completed;Falls prevention discussed Falls evaluation completed  Falls evaluation completed;Education provided Falls evaluation completed;Falls prevention discussed     Cognitive Function:        11/04/2022    2:31 PM 10/30/2021    2:31 PM 10/26/2020   10:49 AM  6CIT Screen  What Year? 0 points 0 points 0 points  What month? 0 points 0 points 0 points  What time? 0 points 0 points 0 points  Count back from 20 0 points 0 points 0 points  Months in reverse 0 points 0 points 0 points  Repeat phrase 0 points 0 points 0 points  Total Score 0 points 0 points 0 points    Immunizations Immunization History  Administered Date(s) Administered   Fluad Quad(high Dose 65+) 10/07/2018, 10/20/2019, 10/19/2020, 10/30/2021   INFLUENZA, HIGH  DOSE SEASONAL PF 12/02/2017   Influenza, Seasonal, Injecte, Preservative Fre 11/22/2013   Influenza,inj,Quad PF,6-35 Mos 11/26/2012   Influenza-Unspecified 11/27/2009, 11/22/2010, 10/28/2011, 11/21/2014, 12/14/2015, 10/17/2016, 12/20/2022   Moderna  Covid-19 Fall Seasonal Vaccine 9yrs & older 01/09/2023   Moderna Sars-Covid-2 Vaccination 04/16/2019, 05/17/2019, 01/17/2020   Pfizer Covid-19 Vaccine Bivalent Booster 31yrs & up 12/25/2020   Pneumococcal Conjugate-13 12/22/2014   Pneumococcal Polysaccharide-23 03/22/2010, 10/19/2020   Tdap 11/30/2012    TDAP status: Up to date  Flu Vaccine status: Due, Education has been provided regarding the importance of this vaccine. Advised may receive this vaccine at local pharmacy or Health Dept. Aware to provide a copy of the vaccination record if obtained from local pharmacy or Health Dept. Verbalized acceptance and understanding.  Pneumococcal vaccine status: Up to date  Covid-19 vaccine status: Completed vaccines  Qualifies for Shingles Vaccine? No   Zostavax completed No   Shingrix Completed?: No.    Education has been provided regarding the importance of this vaccine. Patient has been advised to call insurance company to determine out of pocket expense if they have not yet received this vaccine. Advised may also receive vaccine at local pharmacy or Health Dept. Verbalized acceptance and understanding.  Screening Tests Health Maintenance  Topic Date Due   FOOT EXAM  Never done   OPHTHALMOLOGY EXAM  Never done   Diabetic kidney evaluation - Urine ACR  Never done   Zoster Vaccines- Shingrix (1 of 2) Never done   DTaP/Tdap/Td (2 - Td or Tdap) 12/01/2022   Influenza Vaccine  09/19/2023   COVID-19 Vaccine (6 - Moderna risk 2024-25 season) 10/20/2023   HEMOGLOBIN A1C  01/24/2024   Diabetic kidney evaluation - eGFR measurement  07/24/2024   Medicare Annual Wellness (AWV)  11/05/2024   Mammogram  07/30/2025   Colonoscopy  06/22/2026   Pneumococcal Vaccine: 50+ Years  Completed   DEXA SCAN  Completed   Hepatitis C Screening  Completed   HPV VACCINES  Aged Out   Meningococcal B Vaccine  Aged Out    Health Maintenance  Health Maintenance Due  Topic Date Due   FOOT EXAM  Never done    OPHTHALMOLOGY EXAM  Never done   Diabetic kidney evaluation - Urine ACR  Never done   Zoster Vaccines- Shingrix (1 of 2) Never done   DTaP/Tdap/Td (2 - Td or Tdap) 12/01/2022   Influenza Vaccine  09/19/2023   COVID-19 Vaccine (6 - Moderna risk 2024-25 season) 10/20/2023    Colorectal cancer screening: Type of screening: Colonoscopy. Completed 06/21/2016. Repeat every 5 years  Mammogram status: Completed 07/31/2023. Repeat every year  Bone Density status: Ordered today. Pt provided with contact info and advised to call to schedule appt.  Additional Screening:  Hepatitis C Screening: does qualify; will complete at next office+ visit.   Vision Screening: Recommended annual ophthalmology exams for early detection of glaucoma and other disorders of the eye. Is the patient up to date with their annual eye exam?  Yes  Who is the provider or what is the name of the office in which the patient attends annual eye exams? Dr. Malvina  Dental Screening: Recommended annual dental exams for proper oral hygiene     Plan:     I have personally reviewed and noted the following in the patient's chart:   Medical and social history Use of alcohol, tobacco or illicit drugs  Current medications and supplements including opioid prescriptions. Patient is not currently taking opioid prescriptions. Functional ability and status Nutritional  status Physical activity Advanced directives List of other physicians Hospitalizations, surgeries, and ER visits in previous 12 months Vitals Screenings to include cognitive, depression, and falls Referrals and appointments  In addition, I have reviewed and discussed with patient certain preventive protocols, quality metrics, and best practice recommendations. A written personalized care plan for preventive services as well as general preventive health recommendations were provided to patient.     Audrey Ruiz, [place]   11/06/2023   After Visit Summary:  (Declined) Due to this being a telephonic visit, with patients personalized plan was offered to patient but patient Declined AVS at this time

## 2023-11-10 ENCOUNTER — Ambulatory Visit: Payer: Self-pay | Admitting: Cardiology

## 2023-11-10 NOTE — Progress Notes (Signed)
Remote ICD Transmission.

## 2023-11-11 ENCOUNTER — Telehealth: Payer: Self-pay | Admitting: Cardiology

## 2023-11-11 MED ORDER — ROSUVASTATIN CALCIUM 40 MG PO TABS: 40.0000 mg | ORAL_TABLET | Freq: Every day | ORAL | 1 refills | Status: AC

## 2023-11-11 NOTE — Telephone Encounter (Signed)
 RX sent in

## 2023-11-11 NOTE — Telephone Encounter (Signed)
*  STAT* If patient is at the pharmacy, call can be transferred to refill team.   1. Which medications need to be refilled? (please list name of each medication and dose if known)   rosuvastatin  (CRESTOR ) 40 MG tablet    4. Which pharmacy/location (including street and city if local pharmacy) is medication to be sent to? [facility]  - [place], [place] - [address]    5. Do they need a 30 day or 90 day supply? 90

## 2024-01-26 ENCOUNTER — Ambulatory Visit: Admitting: Family Medicine

## 2024-01-26 ENCOUNTER — Encounter: Payer: Self-pay | Admitting: Family Medicine

## 2024-01-26 VITALS — BP 116/80 | HR 60 | Resp 12 | Ht 67.0 in | Wt 130.0 lb

## 2024-01-26 DIAGNOSIS — E038 Other specified hypothyroidism: Secondary | ICD-10-CM

## 2024-01-26 DIAGNOSIS — E782 Mixed hyperlipidemia: Secondary | ICD-10-CM

## 2024-01-26 DIAGNOSIS — M8580 Other specified disorders of bone density and structure, unspecified site: Secondary | ICD-10-CM

## 2024-01-26 DIAGNOSIS — F330 Major depressive disorder, recurrent, mild: Secondary | ICD-10-CM

## 2024-01-26 DIAGNOSIS — R7303 Prediabetes: Secondary | ICD-10-CM

## 2024-01-26 DIAGNOSIS — K219 Gastro-esophageal reflux disease without esophagitis: Secondary | ICD-10-CM

## 2024-01-26 DIAGNOSIS — I5022 Chronic systolic (congestive) heart failure: Secondary | ICD-10-CM

## 2024-01-26 LAB — POCT LIPID PANEL
HDL: 64
LDL: 37
Non-HDL: 73
TC/HDL: 0.6
TC: 137
TRG: 179

## 2024-01-26 MED ORDER — BUPROPION HCL ER (XL) 150 MG PO TB24: 150.0000 mg | ORAL_TABLET | Freq: Every day | ORAL | 0 refills | Status: AC

## 2024-01-26 NOTE — Assessment & Plan Note (Addendum)
 Managed with Zetia , fish oil, and rosuvastatin . No recent lipid panel results discussed.  Orders:   POCT Lipid Panel   CBC with Differential/Platelet

## 2024-01-26 NOTE — Assessment & Plan Note (Signed)
 SABRA

## 2024-01-26 NOTE — Assessment & Plan Note (Addendum)
 Managed with Entresto . No recent exacerbations or symptoms reported. - Continue Entresto  as prescribed. Orders:   Comprehensive metabolic panel with GFR

## 2024-01-26 NOTE — Assessment & Plan Note (Addendum)
 Hemoglobin A1c 5.8%, 3 month avg of blood sugars, is in prediabetic range.  In order to prevent progression to diabetes, recommend low carb diet and regular exercise  Orders:   Hemoglobin A1c

## 2024-01-26 NOTE — Progress Notes (Unsigned)
 Subjective:  Patient ID: Audrey Ruiz, female    DOB: 04-03-1950  Age: 73 y.o. MRN: [id number]  Chief Complaint  Patient presents with   Medical Management of Chronic Issues    HPI: Discussed the use of AI scribe software for clinical note transcription with the patient, who gave verbal consent to proceed.  History of Present Illness Audrey Ruiz is a 73 year old female with dementia who presents with worsening symptoms and management concerns.  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 Cognitive impairment and behavioral changes - Progressive worsening of dementia symptoms, including increased forgetfulness and occasional episodes of confusion - Recent difficulty with memory, such as not remembering daylight saving time changes - Nocturnal wandering within the home, occurring infrequently - Currently on maximum doses of donepezil and memantine, taken twice daily, with persistent memory deficits - Reluctance to undergo further cognitive testing - Engages in cognitively stimulating activities such as watching nature shows, doing puzzles, and reading - Husband no longer drives - Consideration of using signs to assist with daily task reminders - Requires reminders for personal hygiene, including deodorant application - Husband manages daily activities and care - Considering additional support for personal care, including a female aide for showers - Long-term health insurance may cover in-home care services  Mood and affect - Generally maintains a good mood - Occasional irritability and moodiness         01/26/2024    8:47 AM 11/06/2023    9:08 AM 07/25/2023    9:27 AM 04/10/2023    3:40 PM 02/27/2023    2:14 PM  Depression screen PHQ 2/9  Decreased Interest 1 0 0 0 0  Down, Depressed, Hopeless 1 0 1 0 0  PHQ - 2 Score 2 0 1 0 0  Altered sleeping 0  0  0  Tired, decreased energy 1  2  0  Change in appetite 0  0  0  Feeling  bad or failure about yourself  0  1  0  Trouble concentrating 0  0  0  Moving slowly or fidgety/restless 0  0  0  Suicidal thoughts 0  0  0  PHQ-9 Score 3  4   0   Difficult doing work/chores Not difficult at all  Not difficult at all  Not difficult at all     Data saved with a previous flowsheet row definition        01/26/2024    8:46 AM  Fall Risk   Falls in the past year? 0  Number falls in past yr: 0  Injury with Fall? 0  Risk for fall due to : No Fall Risks  Follow up Falls evaluation completed    Patient Care Team: Sherre Clapper, MD as PCP - General (Family Medicine) Inocencio Soyla Lunger, MD as PCP - Electrophysiology (Cardiology) Bernie Lamar PARAS, MD as PCP - Cardiology (Cardiology) Ezzard Valaria LABOR, MD as Consulting Physician (Oncology) Sheena Pugh, DO as Consulting Physician (Cardiology) Marland Lucie Boot, PA as Physician Assistant (Orthopedic Surgery) Erasmo Bernardino BRAVO, OD (Optometry) Bertrum Rosina HERO, RN as [facility] Care Management (General Practice) Lauro Shona LABOR, RN as Registered Nurse   Review of Systems  Current Outpatient Medications on File Prior to Visit  Medication Sig Dispense Refill   cetirizine  (ALLERGY RELIEF CETIRIZINE ) 10 MG tablet Take 1 tablet (10 mg total) by mouth at bedtime. 90 tablet 3   ezetimibe  (ZETIA ) 10 MG tablet Take 1  tablet (10 mg total) by mouth at bedtime. 90 tablet 3   ferrous sulfate (SLOW IRON) 160 (50 Fe) MG TBCR SR tablet Take 1 tablet by mouth daily.     FLUoxetine  (PROZAC ) 40 MG capsule Take 1 capsule (40 mg total) by mouth daily. 90 capsule 3   fluticasone  (FLONASE ) 50 MCG/ACT nasal spray USE 2 SPRAYS INTO EACH NOSTRIL ONCE DAILY *NEW PRESCRIPTION REQUEST* 48 g 3   ibuprofen  (ADVIL ) 200 MG tablet Take 200 mg by mouth every 8 (eight) hours as needed for moderate pain (pain score 4-6).     levothyroxine  (SYNTHROID ) 25 MCG tablet Take 1 tablet (25 mcg total) by mouth daily before breakfast. 90 tablet 3   Melatonin 10 MG  CAPS Take 10 mg by mouth daily as needed (sleep).     Omega-3 Fatty Acids (FISH OIL) 1000 MG CAPS Take 1,000 mg by mouth 2 (two) times daily.     omeprazole  (PRILOSEC) 20 MG capsule Take 1 capsule (20 mg total) by mouth daily. 90 capsule 3   polyethylene glycol (MIRALAX  / GLYCOLAX ) 17 g packet Take 17 g by mouth daily as needed for mild constipation. Available OTC. 30 each 0   rosuvastatin  (CRESTOR ) 40 MG tablet Take 1 tablet (40 mg total) by mouth at bedtime. 90 tablet 1   sacubitril -valsartan  (ENTRESTO ) 24-26 MG TAKE 1 TABLET BY MOUTH IN THE MORNING AND 1 AT BEDTIME 60 tablet 11   Vitamin D , Cholecalciferol , 25 MCG (1000 UT) CAPS Take 1,000 Units by mouth daily.     No current facility-administered medications on file prior to visit.   Past Medical History:  Diagnosis Date   Abnormal nuclear stress test 08/08/2020   Abnormal nuclear stress test 08/08/2020   Anxiety    Closed fracture of distal end of left radius 08/16/2020   Depressed left ventricular ejection fraction    Depression    Diabetes mellitus without complication (HCC)    Dilated cardiomyopathy (HCC) 08/08/2020   Encounter for Medicare annual wellness exam 10/19/2019   Gastroesophageal reflux disease 10/19/2019   GERD (gastroesophageal reflux disease)    Hydropneumothorax 02/08/2021   Hyperlipidemia    Hyperlipidemia associated with type 2 diabetes mellitus (HCC) 10/19/2019   LBBB (left bundle branch block) 08/17/2020   Mitral valve prolapse    Nonischemic cardiomyopathy (HCC) 08/17/2020   Osteopenia 10/19/2019   Sinusitis 08/16/2019   Upper respiratory tract infection due to COVID-19 virus 03/18/2021   Past Surgical History:  Procedure Laterality Date   BIV ICD INSERTION CRT-D N/A 02/05/2021   Procedure: BIV ICD INSERTION CRT-D;  Surgeon: Inocencio Soyla Lunger, MD;  [facility] INVASIVE CV LAB;  Service: Cardiovascular;  Laterality: N/A;   BREAST LUMPECTOMY Left 2010   CATARACT EXTRACTION BILATERAL W/ ANTERIOR  VITRECTOMY  2021   EYE SURGERY Bilateral    Cataract Removal   LEFT HEART CATH AND CORONARY ANGIOGRAPHY N/A 08/08/2020   Procedure: LEFT HEART CATH AND CORONARY ANGIOGRAPHY;  Surgeon: Anner Alm ORN, MD;  Location: [facility] INVASIVE CV LAB;  Service: Cardiovascular;  Laterality: N/A;    Family History  Problem Relation Age of Onset   Osteoporosis Mother    Heart disease Father    Hyperlipidemia Father    Coronary artery disease Father    Diabetes Sister    Breast cancer Daughter    Diabetes Maternal Grandfather    Social History   Socioeconomic History   Marital status: Married    Spouse name: Elspeth   Number of children: 2  Years of education: Not on file   Highest education level: Master's degree (e.g., MA, MS, MEng, MEd, MSW, MBA)  Occupational History   Occupation: Retired  Tobacco Use   Smoking status: Never   Smokeless tobacco: Never  Vaping Use   Vaping status: Never Used  Substance and Sexual Activity   Alcohol use: Yes    Alcohol/week: 2.0 standard drinks of alcohol    Types: 2 Glasses of wine per week    Comment: occasional (white wine)   Drug use: Never   Sexual activity: Not Currently    Partners: Male  Other Topics Concern   Not on file  Social History Narrative   Son lives in [place], Daughter lives in [place]   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   Social Drivers of Health   Tobacco Use: Low Risk (01/26/2024)   Patient History    Smoking Tobacco Use: Never    Smokeless Tobacco Use: Never    Passive Exposure: Not on file  Financial Resource Strain: Low Risk (11/04/2022)   Overall Financial Resource Strain (CARDIA)    Difficulty of Paying Living Expenses: Not very hard  Food Insecurity: No Food Insecurity (04/09/2023)   Hunger Vital Sign    Worried About Running Out of Food in the Last Year: Never true    Ran Out of Food in the Last Year: Never true  Transportation Needs: No Transportation Needs (04/09/2023)   PRAPARE -  Administrator, Civil Service (Medical): No    Lack of Transportation (Non-Medical): No  Physical Activity: Sufficiently Active (11/04/2022)   Exercise Vital Sign    Days of Exercise per Week: 6 days    Minutes of Exercise per Session: 30 min  Stress: No Stress Concern Present (11/04/2022)   [employer] of Occupational Health - Occupational Stress Questionnaire    Feeling of Stress : Not at all  Social Connections: Socially Integrated (04/03/2023)   Social Connection and Isolation Panel    Frequency of Communication with Friends and Family: Three times a week    Frequency of Social Gatherings with Friends and Family: Three times a week    Attends Religious Services: 1 to 4 times per year    Active Member of Clubs or Organizations: Yes    Attends Banker Meetings: 1 to 4 times per year    Marital Status: Married  Depression (PHQ2-9): Low Risk (01/26/2024)   Depression (PHQ2-9)    PHQ-2 Score: 3  Alcohol Screen: Low Risk (11/04/2022)   Alcohol Screen    Last Alcohol Screening Score (AUDIT): 3  Housing: Low Risk (04/09/2023)   Housing Stability Vital Sign    Unable to Pay for Housing in the Last Year: No    Number of Times Moved in the Last Year: 0    Homeless in the Last Year: No  Utilities: Not At Risk (04/09/2023)   AHC Utilities    Threatened with loss of utilities: No  Health Literacy: Adequate Health Literacy (11/04/2022)   B1300 Health Literacy    Frequency of need for help with medical instructions: Never    Objective:  There were no vitals taken for this visit.     11/06/2023    9:04 AM 08/05/2023   11:12 AM 08/04/2023   10:55 AM  BP/Weight  Systolic BP  112 899  Diastolic BP  58 62  Wt. (Lbs) 130 134 131.8  BMI 20.36 kg/m2 20.99 kg/m2 20.64 kg/m2    Physical  Exam  {Perform Simple Foot Exam  Perform Detailed exam:1} {Insert foot Exam (Optional):30965}   Lab Results  Component Value Date   WBC 4.3 01/30/2024   HGB 11.9 01/30/2024    HCT 38.3 01/30/2024   PLT 241 01/30/2024   GLUCOSE 84 01/30/2024   CHOL 129 07/25/2023   TRIG 89 07/25/2023   HDL 66 07/25/2023   LDLCALC 46 07/25/2023   ALT 14 01/30/2024   AST 22 01/30/2024   NA 136 01/30/2024   K 3.8 01/30/2024   CL 103 01/30/2024   CREATININE 0.89 01/30/2024   BUN 14 01/30/2024   CO2 21 01/30/2024   TSH 2.500 01/30/2024   HGBA1C 5.6 01/30/2024    Results for orders placed or performed in visit on 01/26/24  POCT Lipid Panel   Collection Time: 01/26/24  9:44 AM  Result Value Ref Range   TC 137    HDL 64    TRG 179    LDL 37    Non-HDL 73    TC/HDL 0.6   CBC with Differential/Platelet   Collection Time: 01/30/24  8:33 AM  Result Value Ref Range   WBC 4.3 3.4 - 10.8 x10E3/uL   RBC 3.89 3.77 - 5.28 x10E6/uL   Hemoglobin 11.9 11.1 - 15.9 g/dL   Hematocrit 61.6 65.9 - 46.6 %   MCV 99 (H) 79 - 97 fL   MCH 30.6 26.6 - 33.0 pg   MCHC 31.1 (L) 31.5 - 35.7 g/dL   RDW 87.6 88.2 - 84.5 %   Platelets 241 150 - 450 x10E3/uL   Neutrophils 41 Not Estab. %   Lymphs 41 Not Estab. %   Monocytes 13 Not Estab. %   Eos 4 Not Estab. %   Basos 1 Not Estab. %   Neutrophils Absolute 1.7 1.4 - 7.0 x10E3/uL   Lymphocytes Absolute 1.8 0.7 - 3.1 x10E3/uL   Monocytes Absolute 0.5 0.1 - 0.9 x10E3/uL   EOS (ABSOLUTE) 0.2 0.0 - 0.4 x10E3/uL   Basophils Absolute 0.0 0.0 - 0.2 x10E3/uL   Immature Granulocytes 0 Not Estab. %   Immature Grans (Abs) 0.0 0.0 - 0.1 x10E3/uL  T4, free   Collection Time: 01/30/24  8:33 AM  Result Value Ref Range   Free T4 1.37 0.82 - 1.77 ng/dL  TSH   Collection Time: 01/30/24  8:33 AM  Result Value Ref Range   TSH 2.500 0.450 - 4.500 uIU/mL  Comprehensive metabolic panel with GFR   Collection Time: 01/30/24  8:33 AM  Result Value Ref Range   Glucose 84 70 - 99 mg/dL   BUN 14 8 - 27 mg/dL   Creatinine, Ser 9.10 0.57 - 1.00 mg/dL   eGFR 68 >40 fO/fpw/8.26   BUN/Creatinine Ratio 16 12 - 28   Sodium 136 134 - 144 mmol/L   Potassium 3.8 3.5  - 5.2 mmol/L   Chloride 103 96 - 106 mmol/L   CO2 21 20 - 29 mmol/L   Calcium  10.7 (H) 8.7 - 10.3 mg/dL   Total Protein 6.6 6.0 - 8.5 g/dL   Albumin 4.4 3.8 - 4.8 g/dL   Globulin, Total 2.2 1.5 - 4.5 g/dL   Bilirubin Total 0.8 0.0 - 1.2 mg/dL   Alkaline Phosphatase 59 49 - 135 IU/L   AST 22 0 - 40 IU/L   ALT 14 0 - 32 IU/L  Hemoglobin A1c   Collection Time: 01/30/24  8:33 AM  Result Value Ref Range   Hgb A1c MFr Bld 5.6 4.8 -  5.6 %   Est. average glucose Bld gHb Est-mCnc 114 mg/dL  .  Assessment & Plan:   Assessment & Plan Other specified hypothyroidism Hypothyroidism managed with levothyroxine  25 mcg daily. - Rechecked thyroid  function tests today. - Will consider increasing levothyroxine  dose if thyroid  function tests indicate need. Orders:   T4, free   TSH  Mixed hyperlipidemia Managed with Zetia  and rosuvastatin . No recent lipid panel results discussed. - Continue Zetia  and rosuvastatin  as prescribed. Orders:   POCT Lipid Panel   CBC with Differential/Platelet  Prediabetes Hemoglobin A1c 5.8%, 3 month avg of blood sugars, is in prediabetic range.  In order to prevent progression to diabetes, recommend low carb diet and regular exercise  Orders:   Hemoglobin A1c   Chronic systolic congestive heart failure (HCC) Managed with Entresto . No recent exacerbations or symptoms reported. - Continue Entresto  as prescribed. Orders:   Comprehensive metabolic panel with GFR  Gastroesophageal reflux disease without esophagitis The current medical regimen is effective;  continue present plan and medications. Taking Omeprazole  20 mg daily     Osteopenia, unspecified location Check labs. Recommend continue to work on eating healthy diet and exercise.     Depression, major, recurrent, mild Reports feeling down easily and questioning efficacy of current treatment. - Added Wellbutrin  to current regimen to enhance mood and energy. - Instructed to take Wellbutrin  first thing  in the morning to avoid sleep disturbances. - Sent a 90-day prescription for Wellbutrin . Orders:   buPROPion  (WELLBUTRIN  XL) 150 MG 24 hr tablet; Take 1 tablet (150 mg total) by mouth daily.    There is no height or weight on file to calculate BMI.    Meds ordered this encounter  Medications   buPROPion  (WELLBUTRIN  XL) 150 MG 24 hr tablet    Sig: Take 1 tablet (150 mg total) by mouth daily.    Dispense:  90 tablet    Refill:  0    Orders Placed This Encounter  Procedures   CBC with Differential/Platelet   T4, free   TSH   Comprehensive metabolic panel with GFR   Hemoglobin A1c   POCT Lipid Panel    I,Marla I Leal-Borjas,acting as a scribe for Abigail Free, MD.,have documented all relevant documentation on the behalf of Abigail Free, MD,as directed by  Abigail Free, MD while in the presence of Abigail Free, MD.   Follow-up: Return in about 6 months (around 07/26/2024) for chronic fasting.  An After Visit Summary was printed and given to the patient.  Abigail Free, MD [facility] [phone]

## 2024-01-26 NOTE — Assessment & Plan Note (Signed)
   Orders:   buPROPion  (WELLBUTRIN  XL) 150 MG 24 hr tablet; Take 1 tablet (150 mg total) by mouth daily.

## 2024-01-26 NOTE — Assessment & Plan Note (Addendum)
  Orders:   T4, free   TSH

## 2024-01-26 NOTE — Assessment & Plan Note (Addendum)
Check labs. Recommend continue to work on eating healthy diet and exercise. ° °

## 2024-01-30 ENCOUNTER — Other Ambulatory Visit

## 2024-01-30 DIAGNOSIS — R7303 Prediabetes: Secondary | ICD-10-CM | POA: Diagnosis not present

## 2024-01-30 DIAGNOSIS — I5022 Chronic systolic (congestive) heart failure: Secondary | ICD-10-CM | POA: Diagnosis not present

## 2024-01-30 DIAGNOSIS — E782 Mixed hyperlipidemia: Secondary | ICD-10-CM | POA: Diagnosis not present

## 2024-01-30 LAB — COMPREHENSIVE METABOLIC PANEL WITH GFR
ALT: 14 IU/L (ref 0–32)
AST: 22 IU/L (ref 0–40)
Albumin: 4.4 g/dL (ref 3.8–4.8)
Alkaline Phosphatase: 59 IU/L (ref 49–135)
BUN/Creatinine Ratio: 16 (ref 12–28)
BUN: 14 mg/dL (ref 8–27)
Bilirubin Total: 0.8 mg/dL (ref 0.0–1.2)
CO2: 21 mmol/L (ref 20–29)
Calcium: 10.7 mg/dL — ABNORMAL HIGH (ref 8.7–10.3)
Chloride: 103 mmol/L (ref 96–106)
Creatinine, Ser: 0.89 mg/dL (ref 0.57–1.00)
Globulin, Total: 2.2 g/dL (ref 1.5–4.5)
Glucose: 84 mg/dL (ref 70–99)
Potassium: 3.8 mmol/L (ref 3.5–5.2)
Sodium: 136 mmol/L (ref 134–144)
Total Protein: 6.6 g/dL (ref 6.0–8.5)
eGFR: 68 mL/min/1.73 (ref >59)

## 2024-01-30 LAB — CBC WITH DIFFERENTIAL/PLATELET
Basophils Absolute: 0 x10E3/uL (ref 0.0–0.2)
Basos: 1 %
EOS (ABSOLUTE): 0.2 x10E3/uL (ref 0.0–0.4)
Eos: 4 %
Hematocrit: 38.3 % (ref 34.0–46.6)
Hemoglobin: 11.9 g/dL (ref 11.1–15.9)
Immature Grans (Abs): 0 x10E3/uL (ref 0.0–0.1)
Immature Granulocytes: 0 %
Lymphocytes Absolute: 1.8 x10E3/uL (ref 0.7–3.1)
Lymphs: 41 %
MCH: 30.6 pg (ref 26.6–33.0)
MCHC: 31.1 g/dL — ABNORMAL LOW (ref 31.5–35.7)
MCV: 99 fL — ABNORMAL HIGH (ref 79–97)
Monocytes Absolute: 0.5 x10E3/uL (ref 0.1–0.9)
Monocytes: 13 %
Neutrophils Absolute: 1.7 x10E3/uL (ref 1.4–7.0)
Neutrophils: 41 %
Platelets: 241 x10E3/uL (ref 150–450)
RBC: 3.89 x10E6/uL (ref 3.77–5.28)
RDW: 12.3 % (ref 11.7–15.4)
WBC: 4.3 x10E3/uL (ref 3.4–10.8)

## 2024-01-30 LAB — HEMOGLOBIN A1C
Est. average glucose Bld gHb Est-mCnc: 114 mg/dL
Hgb A1c MFr Bld: 5.6 % (ref 4.8–5.6)

## 2024-01-30 LAB — TSH: TSH: 2.5 u[IU]/mL (ref 0.450–4.500)

## 2024-01-30 LAB — T4, FREE: Free T4: 1.37 ng/dL (ref 0.82–1.77)

## 2024-02-01 ENCOUNTER — Ambulatory Visit: Payer: Self-pay | Admitting: Family Medicine

## 2024-02-03 ENCOUNTER — Ambulatory Visit: Payer: PPO

## 2024-02-03 DIAGNOSIS — I428 Other cardiomyopathies: Secondary | ICD-10-CM

## 2024-02-04 LAB — CUP PACEART REMOTE DEVICE CHECK
Battery Remaining Longevity: 56 mo
Battery Remaining Percentage: 60 %
Battery Voltage: 2.95 V
Brady Statistic AP VP Percent: 1 %
Brady Statistic AP VS Percent: 1 %
Brady Statistic AS VP Percent: 99 %
Brady Statistic AS VS Percent: 1 %
Brady Statistic RA Percent Paced: 1 %
Date Time Interrogation Session: 20251216020016
HighPow Impedance: 71 Ohm
Implantable Lead Connection Status: 753985
Implantable Lead Connection Status: 753985
Implantable Lead Connection Status: 753985
Implantable Lead Implant Date: 20221219
Implantable Lead Implant Date: 20221219
Implantable Lead Implant Date: 20221219
Implantable Lead Location: 753858
Implantable Lead Location: 753859
Implantable Lead Location: 753860
Implantable Pulse Generator Implant Date: 20221219
Lead Channel Impedance Value: 330 Ohm
Lead Channel Impedance Value: 410 Ohm
Lead Channel Impedance Value: 640 Ohm
Lead Channel Pacing Threshold Amplitude: 0.75 V
Lead Channel Pacing Threshold Amplitude: 0.75 V
Lead Channel Pacing Threshold Amplitude: 1.125 V
Lead Channel Pacing Threshold Pulse Width: 0.5 ms
Lead Channel Pacing Threshold Pulse Width: 0.5 ms
Lead Channel Pacing Threshold Pulse Width: 0.5 ms
Lead Channel Sensing Intrinsic Amplitude: 1.7 mV
Lead Channel Sensing Intrinsic Amplitude: 12 mV
Lead Channel Setting Pacing Amplitude: 1.625
Lead Channel Setting Pacing Amplitude: 1.75 V
Lead Channel Setting Pacing Amplitude: 2 V
Lead Channel Setting Pacing Pulse Width: 0.5 ms
Lead Channel Setting Pacing Pulse Width: 0.5 ms
Lead Channel Setting Sensing Sensitivity: 0.5 mV
Pulse Gen Serial Number: 111053351
Zone Setting Status: 755011

## 2024-02-04 NOTE — Progress Notes (Signed)
 Remote ICD Transmission

## 2024-02-16 ENCOUNTER — Ambulatory Visit: Payer: Self-pay | Admitting: Cardiology

## 2024-02-25 ENCOUNTER — Other Ambulatory Visit: Payer: Self-pay | Admitting: Family Medicine

## 2024-02-25 DIAGNOSIS — E038 Other specified hypothyroidism: Secondary | ICD-10-CM

## 2024-02-25 DIAGNOSIS — K219 Gastro-esophageal reflux disease without esophagitis: Secondary | ICD-10-CM

## 2024-02-27 ENCOUNTER — Other Ambulatory Visit: Payer: Self-pay | Admitting: Family Medicine

## 2024-03-01 ENCOUNTER — Other Ambulatory Visit: Payer: Self-pay | Admitting: Family Medicine

## 2024-03-25 ENCOUNTER — Other Ambulatory Visit: Payer: Self-pay | Admitting: Cardiology

## 2024-06-29 ENCOUNTER — Other Ambulatory Visit (HOSPITAL_BASED_OUTPATIENT_CLINIC_OR_DEPARTMENT_OTHER): Admitting: Radiology

## 2024-07-26 ENCOUNTER — Ambulatory Visit: Admitting: Family Medicine

## 2024-08-05 ENCOUNTER — Ambulatory Visit: Admitting: Oncology

## 2024-08-05 ENCOUNTER — Other Ambulatory Visit

## 2024-11-08 ENCOUNTER — Ambulatory Visit

## 2024-11-10 ENCOUNTER — Ambulatory Visit
